# Patient Record
Sex: Male | Born: 1955 | Race: White | Hispanic: No | Marital: Single | Smoking: Never smoker
Health system: Southern US, Community
[De-identification: ages and names within clinical notes are randomized; demographics above are authoritative.]

## PROBLEM LIST (undated history)

## (undated) DIAGNOSIS — Q858 Other phakomatoses, not elsewhere classified: Secondary | ICD-10-CM

## (undated) DIAGNOSIS — G8190 Hemiplegia, unspecified affecting unspecified side: Secondary | ICD-10-CM

## (undated) DIAGNOSIS — J45909 Unspecified asthma, uncomplicated: Secondary | ICD-10-CM

## (undated) DIAGNOSIS — E119 Type 2 diabetes mellitus without complications: Secondary | ICD-10-CM

## (undated) DIAGNOSIS — Q8589 Other phakomatoses, not elsewhere classified: Secondary | ICD-10-CM

## (undated) DIAGNOSIS — R569 Unspecified convulsions: Secondary | ICD-10-CM

## (undated) DIAGNOSIS — N189 Chronic kidney disease, unspecified: Secondary | ICD-10-CM

## (undated) DIAGNOSIS — D649 Anemia, unspecified: Secondary | ICD-10-CM

## (undated) DIAGNOSIS — G4733 Obstructive sleep apnea (adult) (pediatric): Secondary | ICD-10-CM

## (undated) DIAGNOSIS — E7800 Pure hypercholesterolemia, unspecified: Secondary | ICD-10-CM

## (undated) DIAGNOSIS — G40909 Epilepsy, unspecified, not intractable, without status epilepticus: Secondary | ICD-10-CM

## (undated) DIAGNOSIS — H409 Unspecified glaucoma: Secondary | ICD-10-CM

## (undated) DIAGNOSIS — C439 Malignant melanoma of skin, unspecified: Secondary | ICD-10-CM

## (undated) DIAGNOSIS — E785 Hyperlipidemia, unspecified: Secondary | ICD-10-CM

## (undated) DIAGNOSIS — E669 Obesity, unspecified: Secondary | ICD-10-CM

## (undated) HISTORY — DX: Epilepsy, unspecified, not intractable, without status epilepticus: G40.909

## (undated) HISTORY — DX: Other phakomatoses, not elsewhere classified: Q85.89

## (undated) HISTORY — PX: COLONOSCOPY: SHX174

## (undated) HISTORY — DX: Obesity, unspecified: E66.9

## (undated) HISTORY — DX: Malignant melanoma of skin, unspecified: C43.9

## (undated) HISTORY — DX: Unspecified convulsions: R56.9

## (undated) HISTORY — DX: Other phakomatoses, not elsewhere classified: Q85.8

## (undated) HISTORY — DX: Unspecified glaucoma: H40.9

## (undated) HISTORY — DX: Pure hypercholesterolemia, unspecified: E78.00

## (undated) HISTORY — DX: Type 2 diabetes mellitus without complications: E11.9

## (undated) HISTORY — DX: Hemiplegia, unspecified affecting unspecified side: G81.90

## (undated) HISTORY — DX: Obstructive sleep apnea (adult) (pediatric): G47.33

## (undated) HISTORY — DX: Hyperlipidemia, unspecified: E78.5

---

## 1997-09-24 HISTORY — PX: CRANIOTOMY: SHX93

## 1999-08-28 ENCOUNTER — Encounter: Admission: RE | Admit: 1999-08-28 | Discharge: 1999-08-28 | Payer: Self-pay | Admitting: Emergency Medicine

## 1999-08-28 ENCOUNTER — Encounter: Payer: Self-pay | Admitting: Emergency Medicine

## 1999-11-13 ENCOUNTER — Encounter (INDEPENDENT_AMBULATORY_CARE_PROVIDER_SITE_OTHER): Payer: Self-pay | Admitting: *Deleted

## 1999-11-13 ENCOUNTER — Ambulatory Visit (HOSPITAL_BASED_OUTPATIENT_CLINIC_OR_DEPARTMENT_OTHER): Admission: RE | Admit: 1999-11-13 | Discharge: 1999-11-13 | Payer: Self-pay | Admitting: General Surgery

## 2000-05-01 ENCOUNTER — Encounter: Payer: Self-pay | Admitting: Emergency Medicine

## 2000-05-01 ENCOUNTER — Encounter: Admission: RE | Admit: 2000-05-01 | Discharge: 2000-05-01 | Payer: Self-pay | Admitting: Emergency Medicine

## 2001-12-22 ENCOUNTER — Encounter: Payer: Self-pay | Admitting: Pulmonary Disease

## 2001-12-22 ENCOUNTER — Ambulatory Visit (HOSPITAL_BASED_OUTPATIENT_CLINIC_OR_DEPARTMENT_OTHER): Admission: RE | Admit: 2001-12-22 | Discharge: 2001-12-22 | Payer: Self-pay | Admitting: Emergency Medicine

## 2002-01-20 ENCOUNTER — Encounter: Payer: Self-pay | Admitting: Pulmonary Disease

## 2003-05-14 ENCOUNTER — Encounter: Payer: Self-pay | Admitting: Emergency Medicine

## 2003-05-14 ENCOUNTER — Encounter: Admission: RE | Admit: 2003-05-14 | Discharge: 2003-05-14 | Payer: Self-pay | Admitting: Emergency Medicine

## 2003-06-02 ENCOUNTER — Encounter: Admission: RE | Admit: 2003-06-02 | Discharge: 2003-07-26 | Payer: Self-pay | Admitting: Emergency Medicine

## 2003-12-08 ENCOUNTER — Encounter: Admission: RE | Admit: 2003-12-08 | Discharge: 2003-12-08 | Payer: Self-pay | Admitting: Emergency Medicine

## 2003-12-14 ENCOUNTER — Encounter: Admission: RE | Admit: 2003-12-14 | Discharge: 2003-12-14 | Payer: Self-pay | Admitting: Emergency Medicine

## 2003-12-29 ENCOUNTER — Encounter: Admission: RE | Admit: 2003-12-29 | Discharge: 2003-12-29 | Payer: Self-pay | Admitting: Emergency Medicine

## 2004-01-18 ENCOUNTER — Encounter: Admission: RE | Admit: 2004-01-18 | Discharge: 2004-01-18 | Payer: Self-pay | Admitting: Emergency Medicine

## 2004-04-11 ENCOUNTER — Encounter: Admission: RE | Admit: 2004-04-11 | Discharge: 2004-04-11 | Payer: Self-pay | Admitting: Emergency Medicine

## 2004-11-20 ENCOUNTER — Encounter: Admission: RE | Admit: 2004-11-20 | Discharge: 2004-11-20 | Payer: Self-pay | Admitting: Emergency Medicine

## 2005-04-12 ENCOUNTER — Ambulatory Visit: Payer: Self-pay | Admitting: Oncology

## 2005-06-27 ENCOUNTER — Ambulatory Visit: Payer: Self-pay | Admitting: Internal Medicine

## 2005-07-11 ENCOUNTER — Ambulatory Visit: Payer: Self-pay | Admitting: Internal Medicine

## 2005-07-26 ENCOUNTER — Ambulatory Visit: Payer: Self-pay | Admitting: Oncology

## 2005-09-05 IMAGING — US US EXTREM LOW VENOUS BILAT
1 series · 14 of 24 positions shown · non-contrast
Comparison: none

CLINICAL DATA: Severe bilateral leg pain. 
 BILATERAL VENOUS ULTRASOUND
 Comparison ? none.

[Series 1: unknown · 14 of 45 slices shown]
[im 1/45]
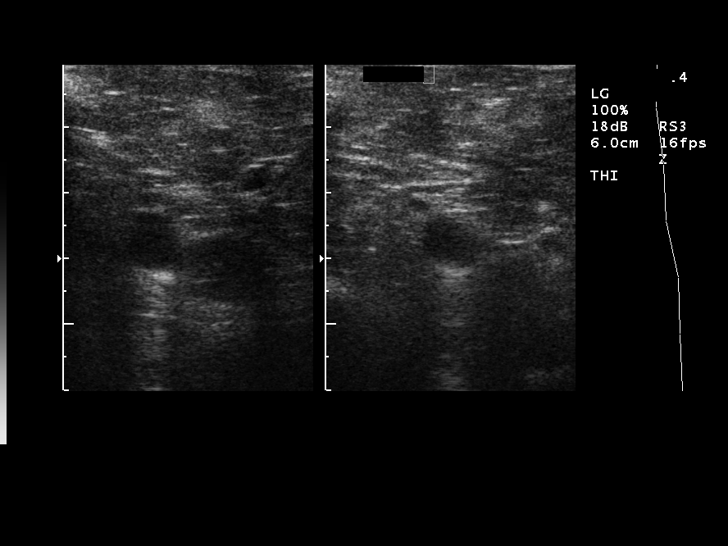
[im 4/45]
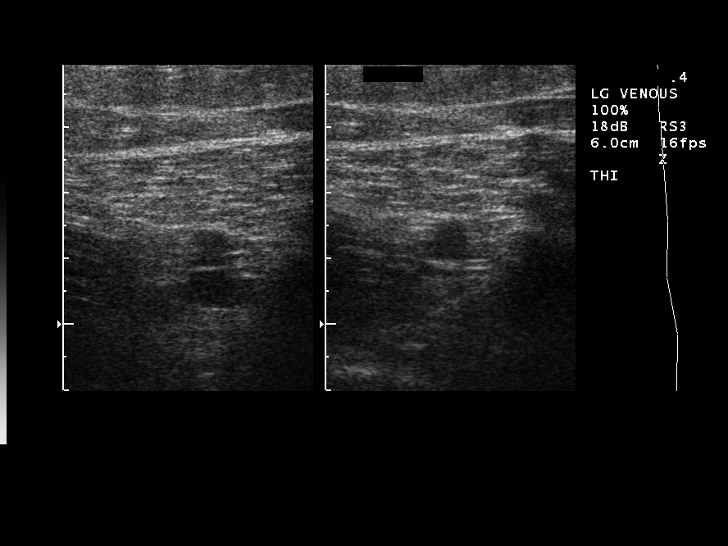
[im 8/45]
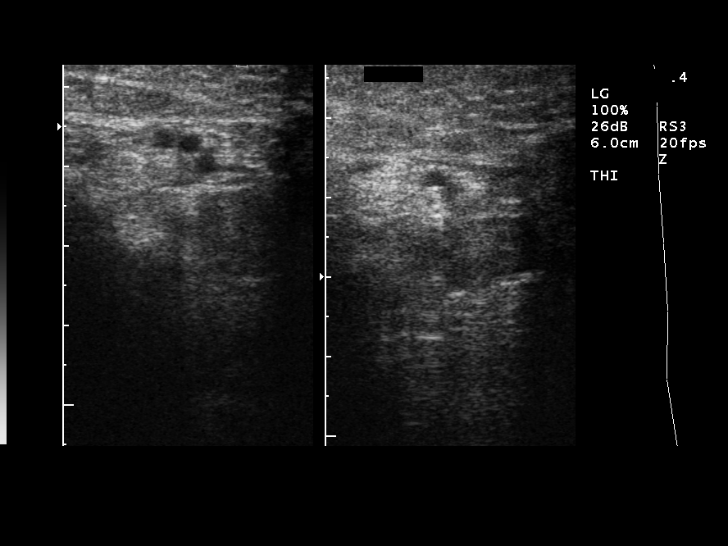
[im 12/45]
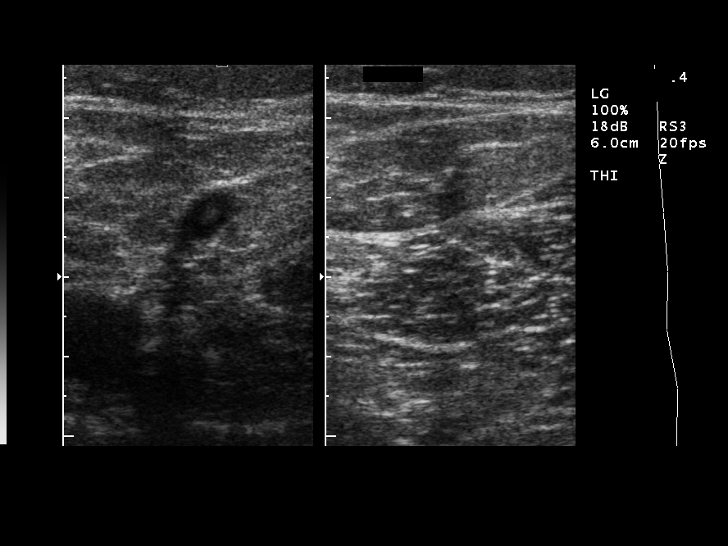
[im 14/45]
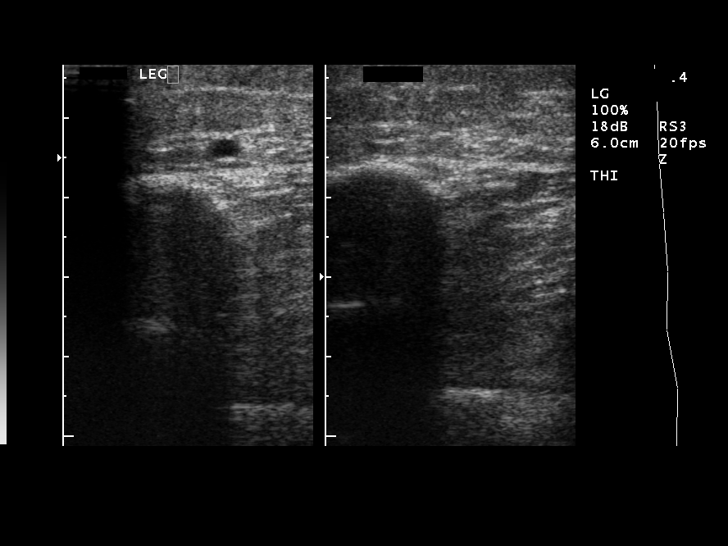
[im 18/45]
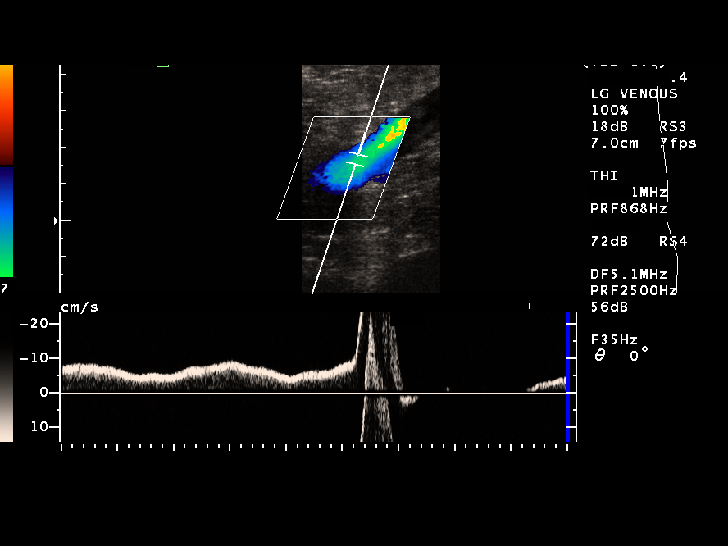
[im 22/45]
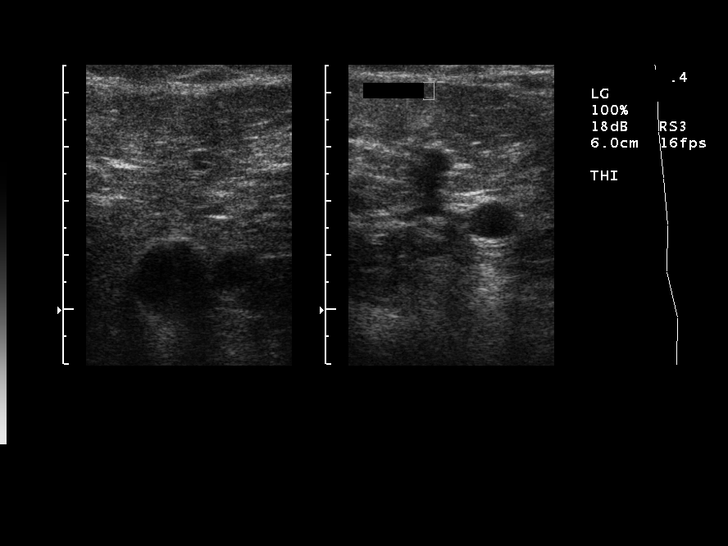
[im 23/45]
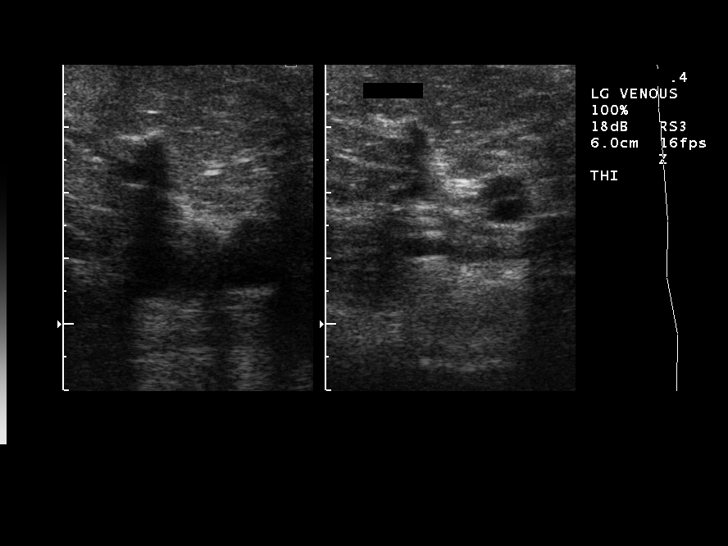
[im 27/45]
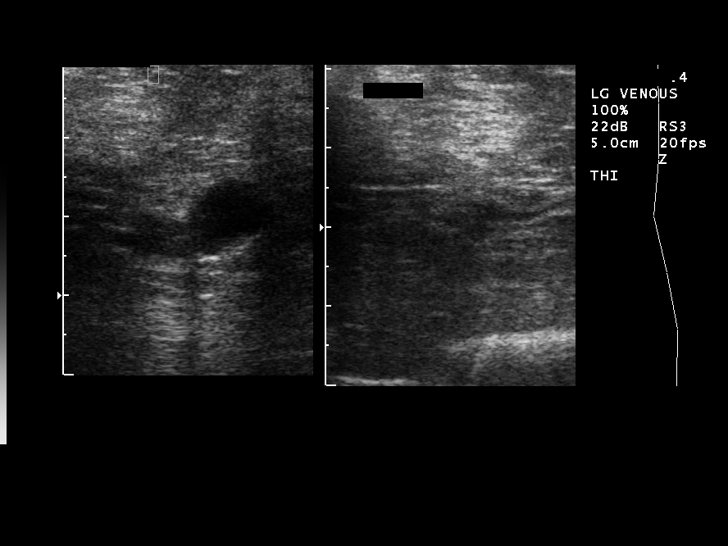
[im 31/45]
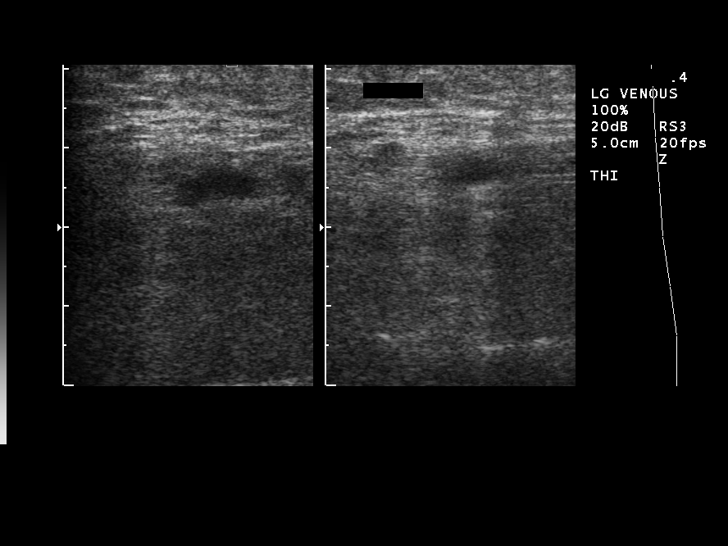
[im 35/45]
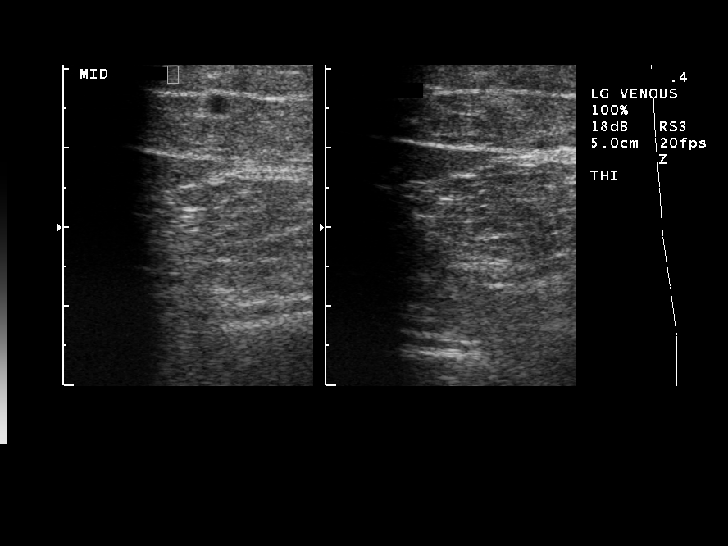
[im 37/45]
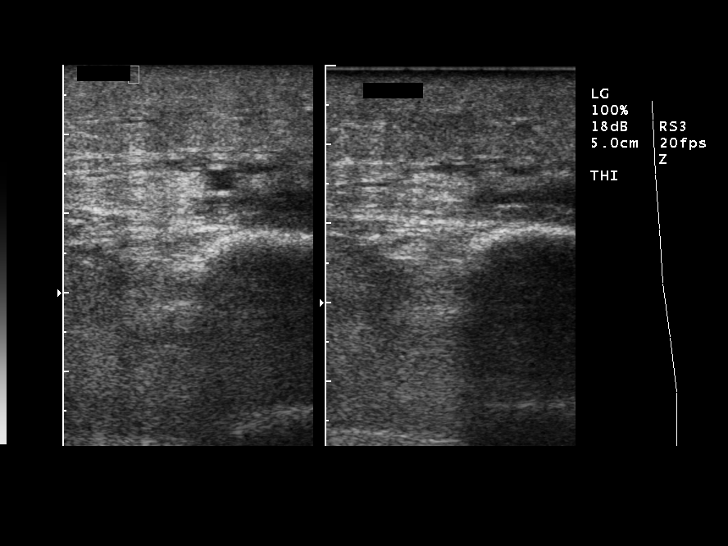
[im 41/45]
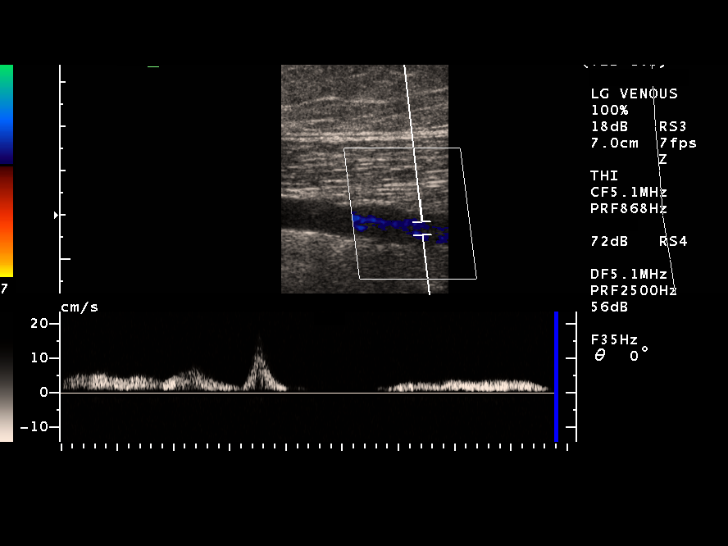
[im 45/45]
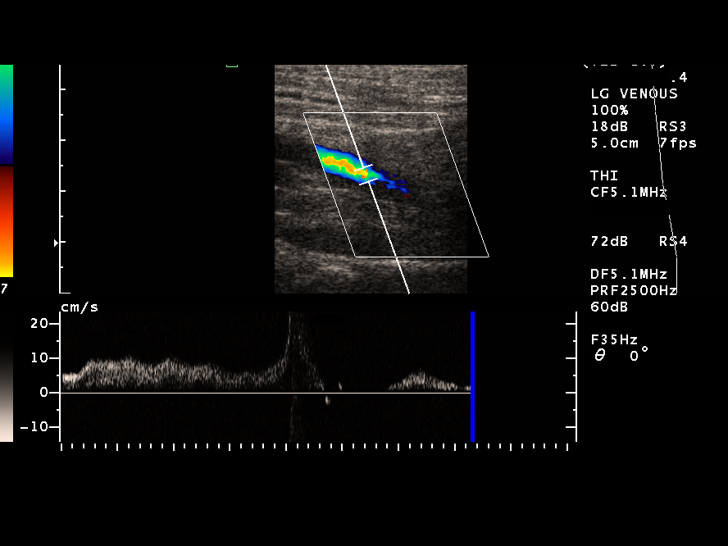

[14 of 24 positions shown; findings below may reference images not displayed]

FINDINGS: Real-time multiplanar gray scale ultrasonography of both lower extremities was performed.  The major deep venous structures show normal patent directional flow, normal compressibility, normal augmentation, and normal phasicity.

 IMPRESSION

 No evidence for deep venous thrombosis in either lower extremity.

## 2006-06-17 ENCOUNTER — Ambulatory Visit: Payer: Self-pay | Admitting: Pulmonary Disease

## 2007-01-03 ENCOUNTER — Encounter: Admission: RE | Admit: 2007-01-03 | Discharge: 2007-01-03 | Payer: Self-pay | Admitting: Emergency Medicine

## 2007-04-14 ENCOUNTER — Encounter: Admission: RE | Admit: 2007-04-14 | Discharge: 2007-04-14 | Payer: Self-pay | Admitting: Emergency Medicine

## 2007-05-07 ENCOUNTER — Encounter: Admission: RE | Admit: 2007-05-07 | Discharge: 2007-05-07 | Payer: Self-pay | Admitting: Emergency Medicine

## 2009-01-10 IMAGING — CR DG CERVICAL SPINE COMPLETE 4+V
7 series · 7 of 7 positions shown · non-contrast
Comparison: none

CLINICAL DATA: Shoulder pain down to arm. 
CERVICAL SPINE - 6 VIEW:

[view not recorded (1 of 7)]
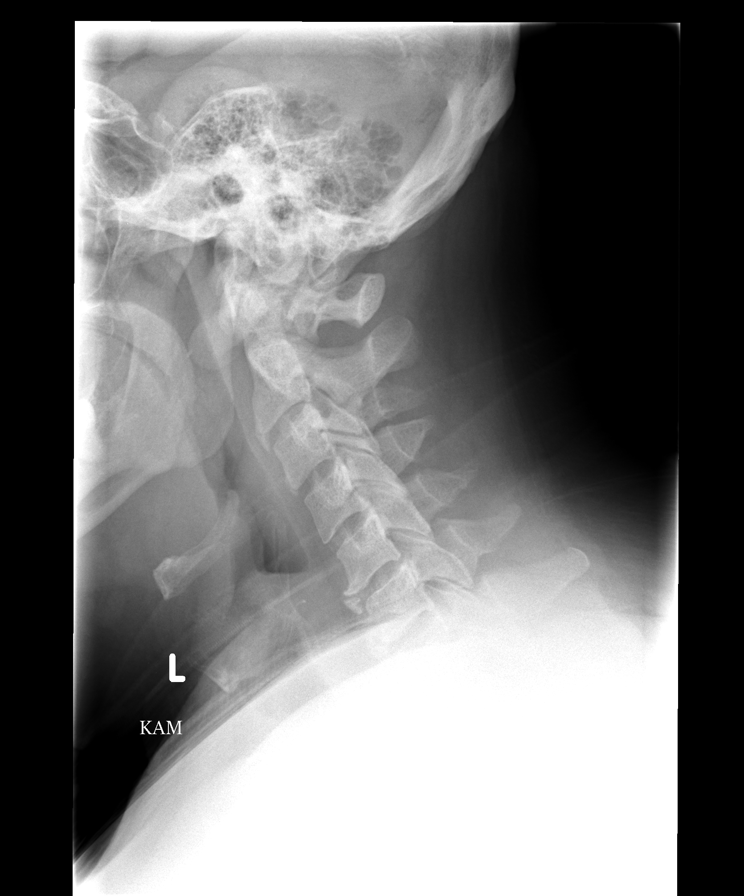

[view not recorded (2 of 7)]
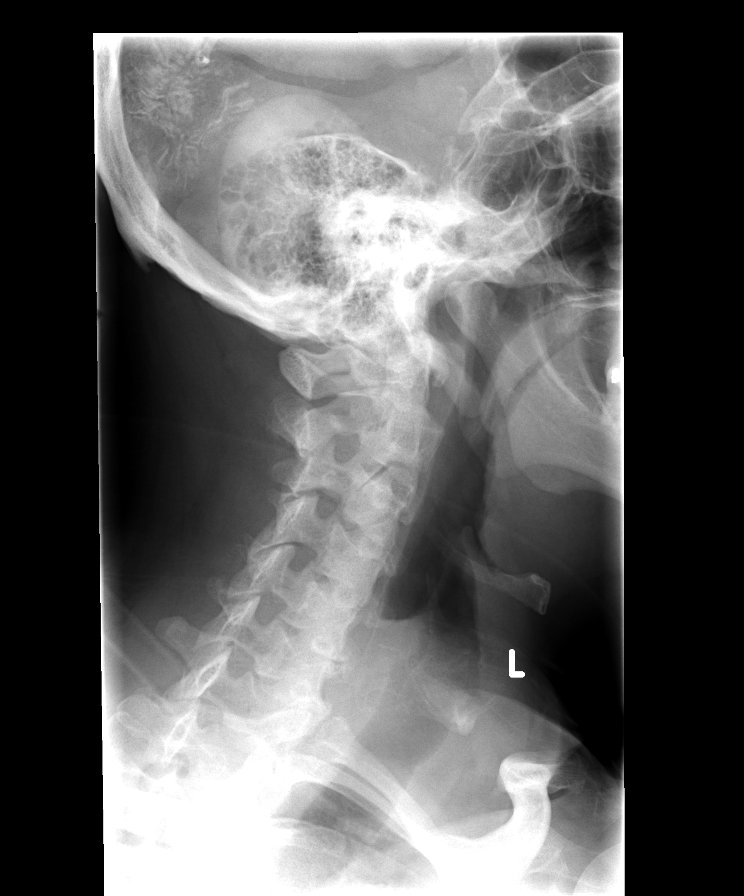

[view not recorded (3 of 7)]
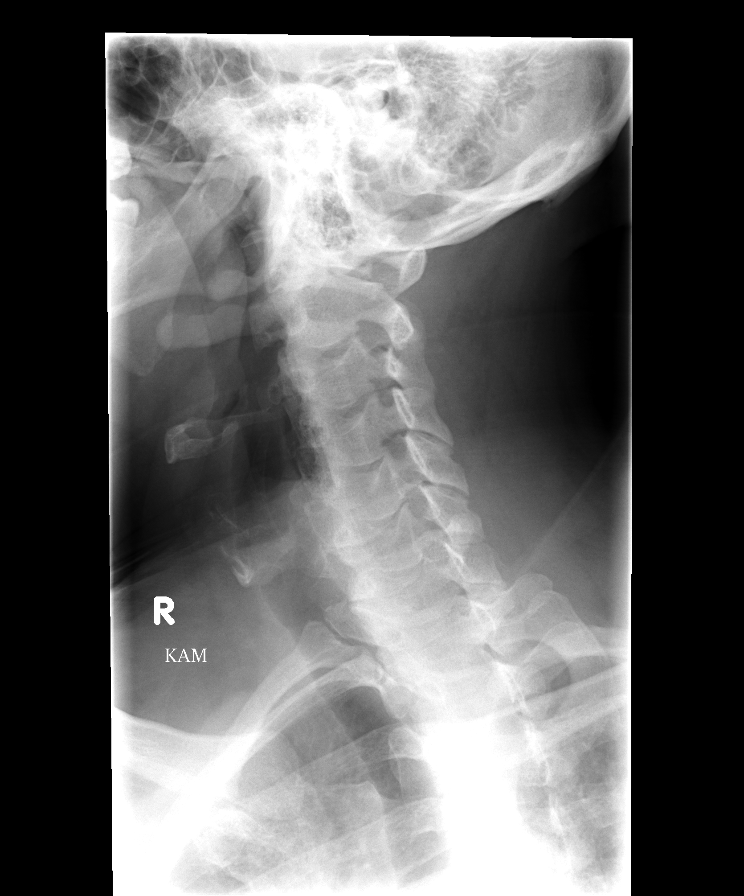

[view not recorded (4 of 7)]
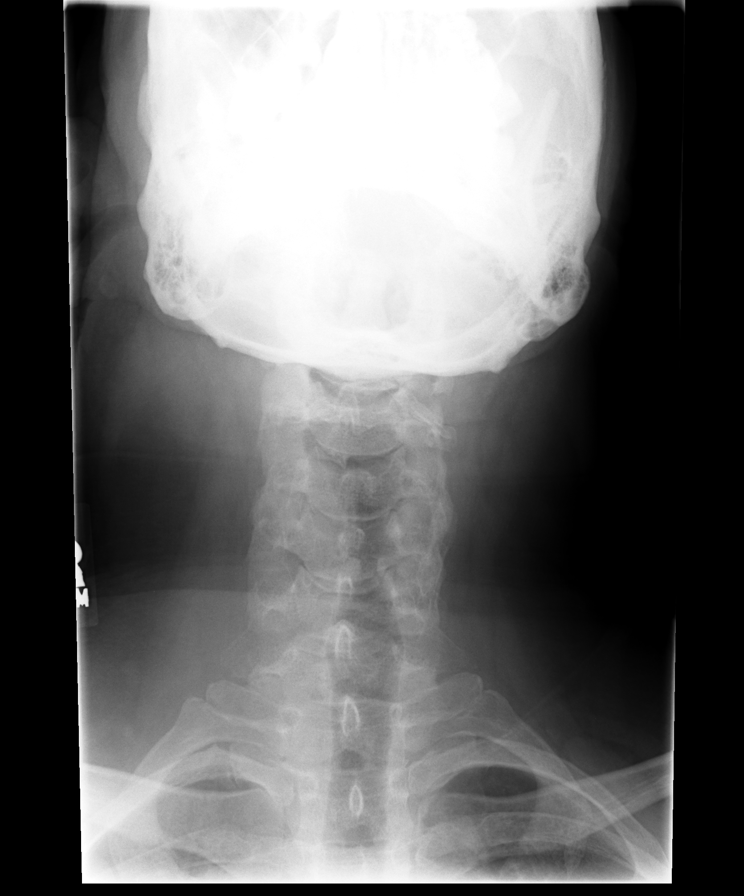

[view not recorded (5 of 7)]
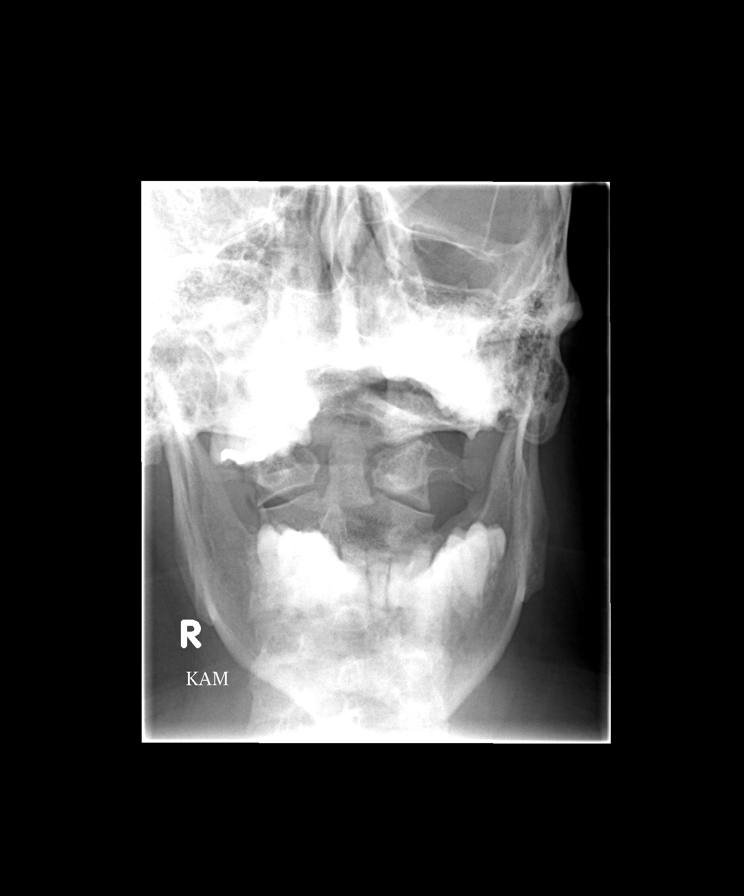

[view not recorded (6 of 7)]
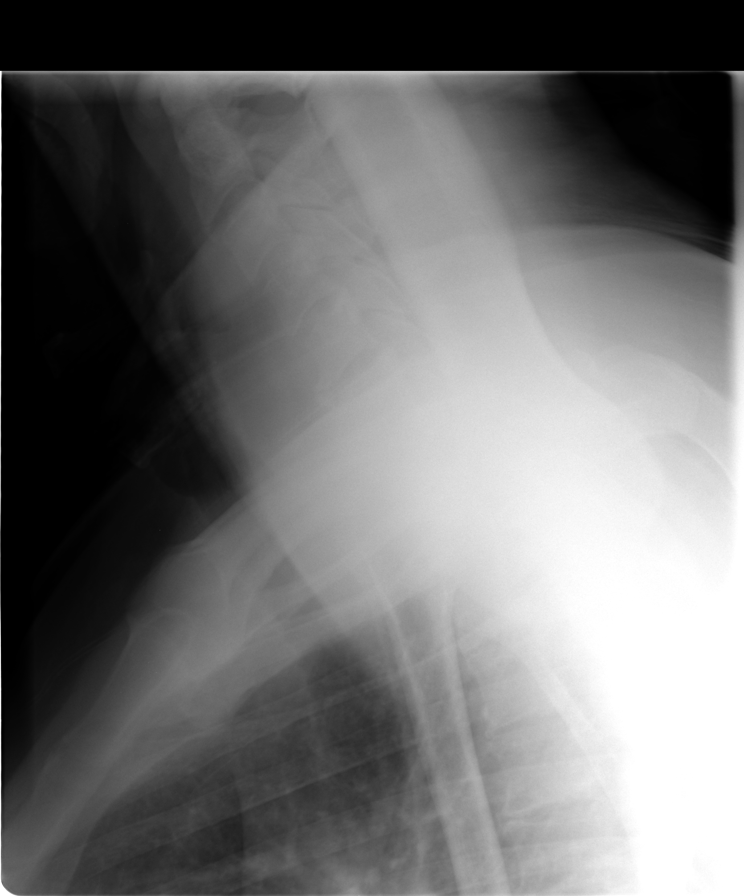

[view not recorded (7 of 7)]
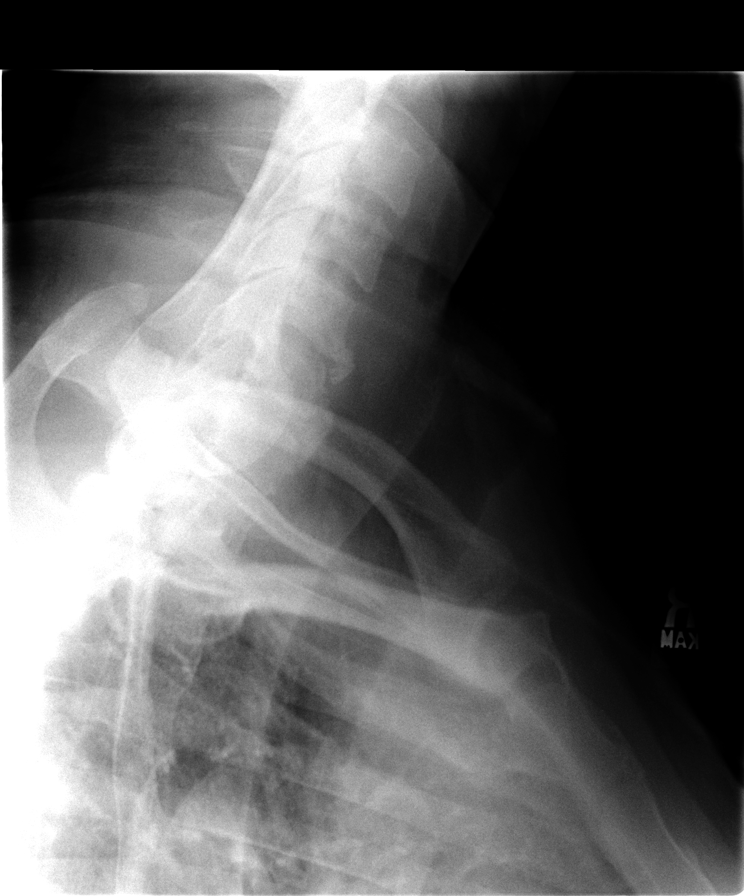

[7 of 7 positions shown; findings below may reference images not displayed]

FINDINGS: The cervical spine is visualized from the occiput to T1.  Prevertebral soft tissues are within normal limits.  There is straightening of the normal cervical lordosis without subluxation or fracture.  Anterior osteophytosis is seen at C2, C3, C5 and C6.  Mild uncovertebral hypertrophy.  Facet sclerosis.   Neural foramina are patent.
IMPRESSION: Spondylosis and straightening without acute finding.

## 2009-09-24 HISTORY — PX: OTHER SURGICAL HISTORY: SHX169

## 2009-10-05 DIAGNOSIS — Z8631 Personal history of diabetic foot ulcer: Secondary | ICD-10-CM | POA: Insufficient documentation

## 2009-10-05 DIAGNOSIS — G4730 Sleep apnea, unspecified: Secondary | ICD-10-CM | POA: Insufficient documentation

## 2009-10-05 DIAGNOSIS — G4733 Obstructive sleep apnea (adult) (pediatric): Secondary | ICD-10-CM | POA: Insufficient documentation

## 2009-10-05 DIAGNOSIS — Z85820 Personal history of malignant melanoma of skin: Secondary | ICD-10-CM | POA: Insufficient documentation

## 2009-10-05 DIAGNOSIS — E7800 Pure hypercholesterolemia, unspecified: Secondary | ICD-10-CM | POA: Insufficient documentation

## 2009-10-05 DIAGNOSIS — Z8669 Personal history of other diseases of the nervous system and sense organs: Secondary | ICD-10-CM

## 2009-10-06 ENCOUNTER — Ambulatory Visit: Payer: Self-pay | Admitting: Pulmonary Disease

## 2009-11-04 ENCOUNTER — Encounter: Payer: Self-pay | Admitting: Pulmonary Disease

## 2010-10-15 ENCOUNTER — Encounter: Payer: Self-pay | Admitting: Emergency Medicine

## 2010-10-24 NOTE — Miscellaneous (Signed)
Summary: ONO on room air  Clinical Lists Changes  Orders: Added new Referral order of DME Referral (DME) - Signed ONO on room air shows low sat 61%, with spent less than 88%.  will start on oxygen at 2 lpm with sleep only.

## 2010-10-24 NOTE — Consult Note (Signed)
Summary: Sleep Med Consult/[facility]  Sleep Med Consult/[facility]   Imported By: Sherian Rein 10/07/2009 10:01:48  _____________________________________________________________________  External Attachment:    Type:   Image     Comment:   External Document

## 2010-10-24 NOTE — Assessment & Plan Note (Signed)
Summary: rov for osa   CC:  Pt is here for a f/u appt.  Pt last saw Dr. Shelle Iron Sept 2007.  Pt states he is currently not wearing cpap machine.  pt states it makes " too much noise" and also difficulty breathing while wearing cpap machine.  Marland Kitchen  History of Present Illness: The pt comes in today for f/u of his known severe osa.  He was started on cpap in the past, and initially did ok, but quickly began to have mask and pressure issues.  Despite trying various things, he was lost to f/u, and has not been seen since 2007.  He has not worn cpap in over a year, and really does not feel this is a viable therapy for him.  He lives in a group home, and his mother feels part of the problem is the upkeep and troubleshooting.  He also complains about the noise from the machine, and that he has difficulty with pressure tolerance (affects his breathing).  Current Medications (verified): 1)  Vitamin D 400 Unit Caps (Cholecalciferol) .... Take 2 Tabs By Mouth Once Daily 2)  Aspirin Low Dose 81 Mg Tabs (Aspirin) .... Take 1 Tablet By Mouth Once A Day 3)  Cosopt 2-0.5 % Soln (Dorzolamide-Timolol) .Marland Kitchen.. 1 Drop in R Eye Two Times A Day 4)  Senna-Plus 8.6-50 Mg Tabs (Sennosides-Docusate Sodium) .... Take 1 Tablet By Mouth Two Times A Day 5)  Fish Oil 1000 Mg Caps (Omega-3 Fatty Acids) .... Take 1 Tablet By Mouth Two Times A Day 6)  Lumigan 0.03 % Soln (Bimatoprost) .Marland Kitchen.. 1 Drop in R Eye At Bedtime 7)  Vytorin 10-20 Mg Tabs (Ezetimibe-Simvastatin) .... Take 1 Tablet By Mouth Once A Day 8)  Artificial Tears  Soln (Artificial Tear Solution) .... Instill in Each Eye Four Times A Day As Needed  Allergies (verified): No Known Drug Allergies  Social History: Patient never smoked.  Pt lives in a group home- [facility] [place].  pt works in the [employer].   Review of Systems      See HPI  Vital Signs:  Patient profile:   55 year old male Weight:      220.13 pounds O2 Sat:      93 % on Room air Temp:      98.1 degrees F oral Pulse rate:   87 / minute BP sitting:   112 / 64  (left arm) Cuff size:   regular  Vitals Entered By: Arman Filter LPN (October 06, 2009 1:31 PM)  O2 Flow:  Room air CC: Pt is here for a f/u appt.  Pt last saw Dr. Shelle Iron Sept 2007.  Pt states he is currently not wearing cpap machine.  pt states it makes " too much noise" and also difficulty breathing while wearing cpap machine.   Comments Unable to verify pt's meds.  pt unsure what meds he is currently taking and did not have a med list with him today.  Aundra Millet Reynolds LPN  October 06, 2009 1:39 PM    Physical Exam  General:  66 male in nad Nose:  no skin breakdown or pressure necrosis from cpap mask   Impression & Recommendations:  Problem # 1:  OBSTRUCTIVE SLEEP APNEA (ICD-327.23) the pt has known severe osa with intolerance of cpap.  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  He really isn't a candidate for  surgery or dental appliance.  He does not feel this is a viable therapy for him longterm, and therefore I would consider nocturnal oxygen as his only treatment.  He understands this is primarily to combat the longterm physiological effects of hypoxemia, and that it will not treat his actual sleep apnea and daytime symptoms.  I have also encouraged him to work on weight loss.  Time spent with pt today was .  Medications Added to Medication List This Visit: 1)  Vitamin D 400 Unit Caps (Cholecalciferol) .... Take 2 tabs by mouth once daily 2)  Aspirin Low Dose 81 Mg Tabs (Aspirin) .... Take 1 tablet by mouth once a day 3)  Cosopt 2-0.5 % Soln (Dorzolamide-timolol) .Marland Kitchen.. 1 drop in r eye two times a day 4)  Senna-plus 8.6-50 Mg Tabs (Sennosides-docusate sodium) .... Take 1 tablet by mouth two times a day 5)  Fish Oil 1000 Mg Caps (Omega-3 fatty acids) .... Take 1 tablet by mouth two times a  day 6)  Lumigan 0.03 % Soln (Bimatoprost) .Marland Kitchen.. 1 drop in r eye at bedtime 7)  Vytorin 10-20 Mg Tabs (Ezetimibe-simvastatin) .... Take 1 tablet by mouth once a day 8)  Artificial Tears Soln (Artificial tear solution) .... Instill in each eye four times a day as needed  Other Orders: Est. Patient Level III (04540) DME Referral (DME)  Patient Instructions: 1)  will try on oxygen at night to help with sleep apnea.  will need to test your oxygen level overnight first, to see what level you need.  Will call you with results 2)  work on weight loss. 3)  followup with me in one year.

## 2011-06-15 ENCOUNTER — Encounter: Payer: Self-pay | Admitting: Pulmonary Disease

## 2011-06-15 ENCOUNTER — Ambulatory Visit: Payer: Self-pay | Admitting: Pulmonary Disease

## 2011-06-18 ENCOUNTER — Ambulatory Visit (INDEPENDENT_AMBULATORY_CARE_PROVIDER_SITE_OTHER): Payer: Medicare Other | Admitting: Pulmonary Disease

## 2011-06-18 ENCOUNTER — Encounter: Payer: Self-pay | Admitting: Pulmonary Disease

## 2011-06-18 VITALS — BP 110/66 | HR 82 | Temp 98.3°F | Ht 66.0 in | Wt 219.0 lb

## 2011-06-18 DIAGNOSIS — G4733 Obstructive sleep apnea (adult) (pediatric): Secondary | ICD-10-CM

## 2011-06-18 NOTE — Progress Notes (Signed)
  Subjective:    Patient ID: Xavier Turner, male    DOB: 10/06/1955, 55 y.o.   MRN: [id number]  HPI Patient comes in today for followup of his very severe obstructive sleep apnea.  He was unable to tolerate CPAP, and therefore was placed on nocturnal oxygen in order to prevent his severe desaturations.  He has been on this compliantly since the last visit and has been doing well.  He continues to have daytime sleepiness, however I have explained to him again the oxygen therapy does not treat his arousals because of apnea.  He has been sent a letter by his DME for recertification of his nocturnal oxygen.   Review of Systems  Constitutional: Negative for fever and unexpected weight change.  HENT: Negative for ear pain, nosebleeds, congestion, sore throat, rhinorrhea, sneezing, trouble swallowing, dental problem, postnasal drip and sinus pressure.   Eyes: Negative for redness and itching.  Respiratory: Negative for cough, chest tightness, shortness of breath and wheezing.   Cardiovascular: Negative for palpitations and leg swelling.  Gastrointestinal: Negative for nausea and vomiting.  Genitourinary: Negative for dysuria.  Musculoskeletal: Negative for joint swelling.  Skin: Negative for rash.  Neurological: Negative for headaches.  Hematological: Does not bruise/bleed easily.  Psychiatric/Behavioral: Negative for dysphoric mood. The patient is not nervous/anxious.        Objective:   Physical Exam Obese male in no acute distress Lower extremities with mild edema, no cyanosis noted Alert, does not appear to be sleepy, moves all 4 extremities.       Assessment & Plan:

## 2011-06-18 NOTE — Assessment & Plan Note (Signed)
The patient has been intolerant of CPAP, and therefore was placed on oxygen in order to treat his severe nocturnal hypoxemia associated with his sleep apnea.  I've asked him to continue on this, and will send a note to his DME for recertification .

## 2011-06-18 NOTE — Patient Instructions (Signed)
Will get your oxygen recertified.  Continue to wear this at night. Work on weight loss

## 2011-06-18 NOTE — Progress Notes (Deleted)
  Subjective:    Patient ID: Xavier Turner, male    DOB: 1955/12/26, 55 y.o.   MRN: [id number]  HPI    Review of Systems  Constitutional: Negative for fever and unexpected weight change.  HENT: Negative for ear pain, nosebleeds, congestion, sore throat, rhinorrhea, sneezing, trouble swallowing, dental problem, postnasal drip and sinus pressure.   Eyes: Negative for redness and itching.  Respiratory: Negative for cough, chest tightness, shortness of breath and wheezing.   Cardiovascular: Negative for palpitations and leg swelling.  Gastrointestinal: Negative for nausea and vomiting.  Genitourinary: Negative for dysuria.  Musculoskeletal: Negative for joint swelling.  Skin: Negative for rash.  Neurological: Negative for headaches.  Hematological: Does not bruise/bleed easily.  Psychiatric/Behavioral: Negative for dysphoric mood. The patient is not nervous/anxious.        Objective:   Physical Exam        Assessment & Plan:

## 2011-08-15 ENCOUNTER — Telehealth: Payer: Self-pay | Admitting: Pulmonary Disease

## 2011-08-15 ENCOUNTER — Other Ambulatory Visit: Payer: Self-pay | Admitting: Pulmonary Disease

## 2011-08-15 DIAGNOSIS — G4733 Obstructive sleep apnea (adult) (pediatric): Secondary | ICD-10-CM

## 2011-08-15 NOTE — Telephone Encounter (Signed)
ONO on room air shows low sat of 84%, but only spent 30 sec the entire night less than 88%.  Does not need nocturnal oxygen.  megan please let pt know he doesn't need oxygen at night, and will send an order to discontinue.

## 2011-08-17 NOTE — Telephone Encounter (Signed)
LMOM for pt TCB 

## 2011-08-22 ENCOUNTER — Telehealth: Payer: Self-pay | Admitting: Pulmonary Disease

## 2011-08-22 NOTE — Telephone Encounter (Signed)
Called and spoke with pts mother about why the oxygen was picked up by [facility].  She is aware that per the sleep study it showed the pt does not need the oxygen at night per [facility].  Mother stated that the pt is just not able to function without the oxygen.  She stated that he is falling asleep during the day and she feels he did better with the oxygen.  appt has been scheduled for Friday for pt  To come in and discuss this with [facility] per mothers request.

## 2011-08-23 NOTE — Telephone Encounter (Signed)
pt's mother already aware of results.  See phone note from 08/22/11

## 2011-08-23 NOTE — Telephone Encounter (Signed)
If neuro eval is unremarkable she stated she will call back to reschedule appt for pt with KC.

## 2011-08-23 NOTE — Telephone Encounter (Signed)
I called and spoke with pt's mother as pt was scheduled to see [facility] on Friday to discuss oxygen status.  However, d/t family emergency, KC's office was cancelled.  I offered to reschedule pt for first week in Dec.  Mother declined stating she going to have pt evaluated by neuro first to make sure what she thinks as pt falling asleep during the day isn't d/t pt having seizures d/t

## 2011-08-24 ENCOUNTER — Ambulatory Visit: Payer: Medicare Other | Admitting: Pulmonary Disease

## 2011-08-31 ENCOUNTER — Encounter: Payer: Self-pay | Admitting: Pulmonary Disease

## 2011-10-18 DIAGNOSIS — Z8669 Personal history of other diseases of the nervous system and sense organs: Secondary | ICD-10-CM | POA: Insufficient documentation

## 2011-10-18 DIAGNOSIS — E669 Obesity, unspecified: Secondary | ICD-10-CM | POA: Insufficient documentation

## 2011-10-18 DIAGNOSIS — F79 Unspecified intellectual disabilities: Secondary | ICD-10-CM | POA: Insufficient documentation

## 2011-10-18 DIAGNOSIS — H409 Unspecified glaucoma: Secondary | ICD-10-CM | POA: Insufficient documentation

## 2011-12-07 DIAGNOSIS — G8194 Hemiplegia, unspecified affecting left nondominant side: Secondary | ICD-10-CM | POA: Insufficient documentation

## 2011-12-07 DIAGNOSIS — R449 Unspecified symptoms and signs involving general sensations and perceptions: Secondary | ICD-10-CM | POA: Insufficient documentation

## 2012-03-18 DIAGNOSIS — E559 Vitamin D deficiency, unspecified: Secondary | ICD-10-CM | POA: Insufficient documentation

## 2012-04-08 DIAGNOSIS — M8580 Other specified disorders of bone density and structure, unspecified site: Secondary | ICD-10-CM | POA: Insufficient documentation

## 2012-04-08 LAB — HM DEXA SCAN

## 2012-04-29 LAB — HM COLONOSCOPY

## 2013-02-05 ENCOUNTER — Other Ambulatory Visit: Payer: Self-pay

## 2013-02-05 MED ORDER — ARMODAFINIL 250 MG PO TABS: 125.0000 mg | ORAL_TABLET | Freq: Two times a day (BID) | ORAL | Status: DC

## 2013-03-13 ENCOUNTER — Institutional Professional Consult (permissible substitution): Payer: Self-pay | Admitting: Neurology

## 2013-03-13 ENCOUNTER — Encounter: Payer: Self-pay | Admitting: Neurology

## 2013-03-13 ENCOUNTER — Ambulatory Visit (INDEPENDENT_AMBULATORY_CARE_PROVIDER_SITE_OTHER): Payer: Medicare Other | Admitting: Neurology

## 2013-03-13 ENCOUNTER — Encounter: Payer: Self-pay | Admitting: *Deleted

## 2013-03-13 VITALS — BP 123/73 | HR 75 | Resp 14 | Ht 64.0 in | Wt 216.0 lb

## 2013-03-13 DIAGNOSIS — G4733 Obstructive sleep apnea (adult) (pediatric): Secondary | ICD-10-CM

## 2013-03-13 DIAGNOSIS — Q858 Other phakomatoses, not elsewhere classified: Secondary | ICD-10-CM | POA: Insufficient documentation

## 2013-03-13 DIAGNOSIS — G4737 Central sleep apnea in conditions classified elsewhere: Secondary | ICD-10-CM

## 2013-03-13 DIAGNOSIS — G4730 Sleep apnea, unspecified: Secondary | ICD-10-CM

## 2013-03-13 DIAGNOSIS — Q8589 Other phakomatoses, not elsewhere classified: Secondary | ICD-10-CM | POA: Insufficient documentation

## 2013-03-13 DIAGNOSIS — G4739 Other sleep apnea: Secondary | ICD-10-CM | POA: Insufficient documentation

## 2013-03-13 NOTE — Assessment & Plan Note (Signed)
Patient on VPAP, with good tolerance at 14 and 8 cm water , since  September 2013 ,  And  Compliance  Downoads. from January- June  2014

## 2013-03-13 NOTE — Patient Instructions (Signed)
Hypersomnia Hypersomnia usually brings recurrent episodes of excessive daytime sleepiness or prolonged nighttime sleep. It is different than feeling tired due to lack of or interrupted sleep at night. People with hypersomnia are compelled to nap repeatedly during the day. This is often at inappropriate times such as:  At work.  During a meal.  In conversation. These daytime naps usually provide no relief. This disorder typically affects adolescents and young adults. CAUSES  This condition may be caused by:  Another sleep disorder (such as narcolepsy or sleep apnea).  Dysfunction of the autonomic nervous system.  Drug or alcohol abuse.  A physical problem, such as:  A tumor.  Head trauma. This is damage caused by an accident.  Injury to the central nervous system.  Certain medications, or medicine withdrawal.  Medical conditions may contribute to the disorder, including:  Multiple sclerosis.  Depression.  Encephalitis.  Epilepsy.  Obesity.  Some people appear to have a genetic predisposition to this disorder. In others, there is no known cause. SYMPTOMS   Patients often have difficulty waking from a long sleep. They may feel dazed or confused.  Other symptoms may include:  Anxiety.  Increased irritation (inflammation).  Decreased energy.  Restlessness.  Slow thinking.  Slow speech.  Loss of appetite.  Hallucinations.  Memory difficulty.  Tremors, Tics.  Some patients lose the ability to function in family, social, occupational, or other settings. TREATMENT  Treatment is symptomatic in nature. Stimulants and other drugs may be used to treat this disorder. Changes in behavior may help. For example, avoid night work and social activities that delay bed time. Changes in diet may offer some relief. Patients should avoid alcohol and caffeine. PROGNOSIS  The likely outcome (prognosis) for persons with hypersomnia depends on the cause of the disorder.  The disorder itself is not life threatening. But it can have serious consequences. For example, automobile accidents can be caused by falling asleep while driving. The attacks usually continue indefinitely. Document Released: 08/31/2002 Document Revised: 12/03/2011 Document Reviewed: 08/04/2008 [facility] Patient Information 2014 [place], [place]. CPAP and BIPAP CPAP and BIPAP are methods of helping you breathe. CPAP stands for "continuous positive airway pressure." BIPAP stands for "bi-level positive airway pressure." Both CPAP and BIPAP are provided by a small machine with a flexible plastic tube that attaches to a plastic mask that goes over your nose or mouth. Air is blown into your air passages through your nose or mouth. This helps to keep your airways open and helps to keep you breathing well. The amount of pressure that is used to blow the air into your air passages can be set on the machine. The pressure setting is based on your needs. With CPAP, the amount of pressure stays the same while you breathe in and out. With BIPAP, the amount of pressure changes when you inhale and exhale. Your caregiver will recommend whether CPAP or BIPAP would be more helpful for you.  CPAP and BIPAP can be helpful for both adults and children with:  Sleep apnea.  Chronic Obstructive Pulmonary Disease (COPD), a condition like emphysema.  Diseases which weaken the muscles of the chest such as muscular dystrophy or neurological diseases.  Other problems that cause breathing to be weak or difficult. USE OF CPAP OR BIPAP The respiratory therapist or technician will help you get used to wearing the mask. Some people feel claustrophobic (a trapped or closed in feeling) at first, because the mask needs to be fairly snug on your face.   It  may help you to get used to the mask gradually, by first holding the mask loosely over your nose or mouth using a low pressure setting on the machine. Gradually the mask can be applied  more snugly with increased pressure. You can also gradually increase the amount of time the mask is used.  People with sleep apnea will use the mask and machine at night when they are sleeping. Others, like those with ALS or other breathing difficulties, may need the CPAP or BIPAP all the time.  If the first mask you try does not fit well, or is uncomfortable, there are other types and sizes that can be tried.  If you tend to breathe through your mouth, a chin strap may be applied to help keep your mouth closed (if you are using a nasal mask).  The CPAP and BIPAP machines have alarms that may sound if the mask comes off or develops a leak.  You should not eat or drink while the CPAP or BIPAP is on. Food or fluids could get pushed into your lungs by the pressure of the CPAP or BIPAP. Sometimes CPAP or BIPAP machines are ordered for home use. If you are going to use the CPAP or BIPAP machine at home, follow these instructions  CPAP or BIPAP machines can be rented or purchased through home health care companies. There are many different brands of machines available. If you rent a machine before purchasing you may find which particular machine works well for you.  Ask questions if there is something you do not understand when picking out your machine.  Place your CPAP or BIPAP machine on a secure table or stand near an electrical outlet.  Know where the On/Off switch is.  Follow your doctor's instructions for how to set the pressure on your machine and when you should use it.  Do not smoke! Tobacco smoke residue can damage the machine. SEEK IMMEDIATE MEDICAL CARE IF:   You have redness or open areas around your nose or mouth.  You have trouble operating the CPAP or BIPAP machine.  You cannot tolerate wearing the CPAP or BIPAP mask.  You have any questions or concerns. Document Released: 06/08/2004 Document Revised: 12/03/2011 Document Reviewed: 09/07/2008 [facility] Patient Information  2014 [place], [place]. Sleep Apnea Sleep apnea is disorder that affects a person's sleep. A person with sleep apnea has abnormal pauses in their breathing when they sleep. It is hard for them to get a good sleep. This makes a person tired during the day. It also can lead to other physical problems. There are three types of sleep apnea. One type is when breathing stops for a short time because your airway is blocked (obstructive sleep apnea). Another type is when the brain sometimes fails to give the normal signal to breathe to the muscles that control your breathing (central sleep apnea). The third type is a combination of the other two types. HOME CARE  Do not sleep on your back. Try to sleep on your side.  Take all medicine as told by your doctor.  Avoid alcohol, calming medicines (sedatives), and depressant drugs.  Try to lose weight if you are overweight. Talk to your doctor about a healthy weight goal. Your doctor may have you use a device that helps to open your airway. It can help you get the air that you need. It is called a positive airway pressure (PAP) device. There are three types of PAP devices:  Continuous positive airway pressure (CPAP) device.  Nasal expiratory positive airway pressure (EPAP) device.  Bilevel positive airway pressure (BPAP) device. MAKE SURE YOU:  Understand these instructions.  Will watch your condition.  Will get help right away if you are not doing well or get worse. Document Released: 06/19/2008 Document Revised: 08/27/2012 Document Reviewed: 01/12/2012 [facility] Patient Information 2014 [place], [place].

## 2013-03-13 NOTE — Progress Notes (Signed)
[facility]  Provider:  Dr Tramayne Sebesta Referring Provider: Reuben Likes, MD Primary Care Physician:  Roque Lias, MD  Chief Complaint  Patient presents with  . Neurologic Problem    Sleep Apnea.Marland Kitchen.RM#10    HPI:  Xavier Turner is a 57 y.o. male here since 2013 as a referral from Dr. Lorenz Coaster for  Apnea follow up and [name] , phakomatosis. This is a revisit.   Xavier Turner  is a mean bile 57 year old Caucasian male with a history of Sturge-Weber syndrome and related epilepsy, MRDD , hemiparesis , and resides in a group home.  The patient underwent brain surgery in 1999 under Dr. Ok [facility] became Seizure-free afterwards. He had been diagnosed with apnea,  I believe originally at [facility] and was prescribed a CPAP but it was removed after the day the download showed noncompliance.  The patient had also seen Dr. Jetty Duhamel at [facility] in 2011 or 2012. Since Dr. Lorenz Coaster is retired , the patient has followed up with his new  primary care provider Dr. Cyndia Bent . I first saw Xavier Turner on 06-02-12 and after a sleep study had been ordered , the  sleep study took place on 04/15/2012 - it became a split night study based on the patient's Epworth sleepiness core of 18 points his Beck Depression Inventory at 13 points, this neck circumference of 20 inches and is BMI of 36.7.  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  The seemed to be effective it should be noted that the patient slept 144 minutes at the setting the AHI was averaging 9 an hour which was the best so far he could do for him. Daily at obtained a download from the CPAP  machine they can compare it to the previous Download from  9th of September 2013- the patient uses a VPAP ultra machine settings are 8 cm expiratory pressure and an inspiratory pressure of 14 cm water,  he has a very low airleak , RR 16 per minute on average respiratory rate, apnea index of 8.3 ,  the median daily usage was 4 hours and 14 minutes,  but there are many days on which the patient was not using his machine.   In the past I have always encountered Xavier Turner mother in our visits today he is here with hs charge. I explained to Xavier Turner and his group home manager at an average of 4 hours nightly is the minimum compliance time that Medicare will accept. It is evident that the previous apnea index at baseline the 12 is now reduced to 8.3 of  which 7.4 are  obstructive apneas  seen per hour and 0.1 central apneas.  I consider this a good success in the treatment. We address  primarily compliance today .   Review of Systems: Out of a complete 14 system review, the patient complains of only the following symptoms, and all other reviewed systems are negative. The patient had no seizures since 1999, he has left-sided weakness and muscle atrophy caused by his sturgy weber syndrome , right facial port wine stain. He does endorse  the Epworth sleepiness score today at 18 points as  he is nodding off in the exam room.  History   Social History  . Marital Status: Single    Spouse Name: N/A    Number of Children: N/A  . Years of Education: N/A   Occupational History  . [employer]     resides in a group home([facility])   Social History Main Topics  . Smoking status: Never Smoker   . Smokeless tobacco: Not on file  . Alcohol Use: Not on file  . Drug Use: Not on file  . Sexually Active: Not on file   Other Topics Concern  . Not on file   Social History Narrative  . No narrative on file    Family History  Problem Relation Age of Onset  . Emphysema Father   . Cancer Father   .  Asthma Mother     Past Medical History  Diagnosis Date  . Seizure disorder   . Melanoma   . Hypercholesterolemia   . OSA (obstructive sleep apnea)   . Glaucoma   . Hemiparesis   . Hyperlipemia   . Obesity   . Seizure   . Sturge syndrome   . Diabetes   . Melanoma   . Sturge-Weber syndrome     Past Surgical History  Procedure Laterality Date  . Craniotomy  1999  . Melanoma removal  2011  . Colonoscopy      Current Outpatient Prescriptions  Medication Sig Dispense Refill  . Armodafinil 250 MG tablet Take 0.5 tablets (125 mg total) by mouth 2 (two) times daily.  30 tablet  5  . ARTIFICIAL TEAR OP Instill in each 4 times a day as needed       . aspirin 81 MG tablet Take 81 mg by mouth daily.        . bimatoprost (LUMIGAN) 0.03 % ophthalmic solution Place 1 drop into the right eye at bedtime.        . dorzolamide-timolol (COSOPT) 22.3-6.8 MG/ML ophthalmic solution Place 1 drop into the right eye 2 (two) times daily.        . Ergocalciferol (VITAMIN D2) 400 UNITS TABS 2 tablets a day       . ezetimibe-simvastatin (VYTORIN) 10-20 MG per tablet Take 1 tablet by mouth daily.        . fish oil-omega-3 fatty acids 1000 MG capsule Take 2 g by mouth 2 (two) times daily.        Marland Kitchen senna-docusate (SENOKOT-S) 8.6-50 MG per tablet Take 1 tablet by mouth 2 (two) times daily.         No current facility-administered medications for this visit.    Allergies as of 03/13/2013 - Review Complete 03/13/2013  Allergen Reaction Noted  . Metformin and related  03/13/2013    Vitals: BP 123/73  Pulse 75  Resp 14  Ht 5\' 4"  (1.626 m)  Wt 216 lb (97.977 kg)  BMI 37.06 kg/m2 Last Weight:  Wt Readings from Last 1 Encounters:  03/13/13 216 lb (97.977 kg)   Last Height:   Ht Readings from Last 1 Encounters:  03/13/13 5\' 4"  (1.626 m)     Physical exam:  General: The patient is awake, alert and appears not in acute distress. The patient is well groomed. Head: Normocephalic, atraumatic. Neck  is supple, he has  A high circumference  20 inches  Mallampati 4 plus- with a semi paralyzed uvula. , neck circumference:20. Poor dentition. Cardiovascular:  Regular rate  and rhythm without  murmurs or carotid bruit, and without distended neck veins. Respiratory: Lungs are clear to auscultation. Skin:  Port wine stain , covers the full left face and neck.  Trunk: BMI is elevated and patient  has left hemiparesis.  Neurologic exam : The patient is drowsy  oriented to place and time.  Memory ,attention span & concentration ability all limited  Speech is fluent without  aphasia. Mood and affect are appropriate.  Cranial nerves: Pupils are equal and briskly reactive to light. Funduscopic exam  deferred.  Extraocular movements : only the left eye  in vertical and horizontal planes intact and without nystagmus.  right eye ptosis and decreased vision form phakomatosis. Visual fields by finger perimetry are intact. Hearing to finger rub intact.  Facial sensation is hypersensitive on the left to fine touch. Facial motor strength: left droop.  Motor exam: hemiparesis.   I have discussed with Xavier Turner and his group home manager that he needs to improve his daily use of the CPAP machine. Compliance is considered 4 hours or more of nightly use.  The patient is very drowsy today and endorse the Epworth score at 18 points he reports that he feels a difference when using the machine delivered the beneficial for him to at least initiate each evening the primary use. The patient is hemiparetic he has trouble to readjust and replace the mask after a bathroom break. His regular bedtime as to intended 11 PM he rises in the morning between 5 to 6 AM. He goes to the bathroom 4 times at night or more. Educational material about sleep at apnea ,the risk factors of  SA and sleep apnea as a risk factor were provided again, this time not to Mr Turner;s mother but his group Land .   I hope this 35 minute session will  help with compliance and assistance for the [facility].

## 2013-03-24 ENCOUNTER — Encounter: Payer: Self-pay | Admitting: Neurology

## 2013-08-19 ENCOUNTER — Other Ambulatory Visit: Payer: Self-pay | Admitting: Neurology

## 2013-08-19 MED ORDER — ARMODAFINIL 250 MG PO TABS: 125.0000 mg | ORAL_TABLET | Freq: Two times a day (BID) | ORAL | Status: DC

## 2013-08-25 ENCOUNTER — Ambulatory Visit: Payer: Medicare Other | Admitting: Neurology

## 2014-01-11 ENCOUNTER — Telehealth: Payer: Self-pay | Admitting: *Deleted

## 2014-01-11 NOTE — Telephone Encounter (Signed)
Caregiver called and stated patient needing refill for Nuvigil 250 mg.  Was instructed by pharmacy that patient needed to see Dr. Brett Fairy prior to have med refilled.  Caregiver wasn't aware of appointment in Dec.  Please advise

## 2014-01-14 ENCOUNTER — Other Ambulatory Visit: Payer: Self-pay

## 2014-01-14 MED ORDER — ARMODAFINIL 250 MG PO TABS: 125.0000 mg | ORAL_TABLET | Freq: Two times a day (BID) | ORAL | Status: DC

## 2014-01-14 NOTE — Telephone Encounter (Signed)
Rx signed and faxed.

## 2014-01-14 NOTE — Telephone Encounter (Signed)
Caregiver Aaron Edelman called back concerning pt's medication Nuvigil 250 mg, please see previous call on 01/11/14, please call Aaron Edelman back concerning this matter, pt has enough till the end of this month only. Thanks

## 2014-01-14 NOTE — Telephone Encounter (Signed)
It does not appear the original message was ever forwarded to anyone.  Refill request has been sent to provider for approval.

## 2015-07-05 DIAGNOSIS — E1140 Type 2 diabetes mellitus with diabetic neuropathy, unspecified: Secondary | ICD-10-CM | POA: Insufficient documentation

## 2015-08-23 ENCOUNTER — Encounter: Payer: Self-pay | Admitting: Internal Medicine

## 2015-08-24 ENCOUNTER — Encounter: Payer: Self-pay | Admitting: Internal Medicine

## 2015-09-14 DIAGNOSIS — Z79899 Other long term (current) drug therapy: Secondary | ICD-10-CM | POA: Insufficient documentation

## 2015-09-14 DIAGNOSIS — R600 Localized edema: Secondary | ICD-10-CM | POA: Insufficient documentation

## 2016-02-29 DIAGNOSIS — F4321 Adjustment disorder with depressed mood: Secondary | ICD-10-CM | POA: Insufficient documentation

## 2017-09-27 DIAGNOSIS — J040 Acute laryngitis: Secondary | ICD-10-CM | POA: Insufficient documentation

## 2017-11-26 DIAGNOSIS — H02401 Unspecified ptosis of right eyelid: Secondary | ICD-10-CM | POA: Insufficient documentation

## 2019-06-29 ENCOUNTER — Encounter (HOSPITAL_COMMUNITY): Payer: Self-pay

## 2019-06-29 ENCOUNTER — Emergency Department (HOSPITAL_COMMUNITY)
Admission: EM | Admit: 2019-06-29 | Discharge: 2019-06-29 | Disposition: A | Discharge status: Home / Self Care | Payer: Medicare Other | Attending: Emergency Medicine | Admitting: Emergency Medicine

## 2019-06-29 ENCOUNTER — Other Ambulatory Visit: Payer: Self-pay

## 2019-06-29 DIAGNOSIS — E119 Type 2 diabetes mellitus without complications: Secondary | ICD-10-CM | POA: Diagnosis not present

## 2019-06-29 DIAGNOSIS — L03116 Cellulitis of left lower limb: Secondary | ICD-10-CM | POA: Diagnosis not present

## 2019-06-29 DIAGNOSIS — Z7984 Long term (current) use of oral hypoglycemic drugs: Secondary | ICD-10-CM | POA: Insufficient documentation

## 2019-06-29 DIAGNOSIS — Z7982 Long term (current) use of aspirin: Secondary | ICD-10-CM | POA: Diagnosis not present

## 2019-06-29 DIAGNOSIS — Z85820 Personal history of malignant melanoma of skin: Secondary | ICD-10-CM | POA: Diagnosis not present

## 2019-06-29 DIAGNOSIS — M25572 Pain in left ankle and joints of left foot: Secondary | ICD-10-CM | POA: Diagnosis present

## 2019-06-29 LAB — CBC
HCT: 39.2 % (ref 39.0–52.0)
Hemoglobin: 12.7 g/dL — ABNORMAL LOW (ref 13.0–17.0)
MCH: 28.3 pg (ref 26.0–34.0)
MCHC: 32.4 g/dL (ref 30.0–36.0)
MCV: 87.5 fL (ref 80.0–100.0)
Platelets: 167 10*3/uL (ref 150–400)
RBC: 4.48 MIL/uL (ref 4.22–5.81)
RDW: 14.5 % (ref 11.5–15.5)
WBC: 14 10*3/uL — ABNORMAL HIGH (ref 4.0–10.5)
nRBC: 0 % (ref 0.0–0.2)

## 2019-06-29 LAB — BASIC METABOLIC PANEL
Anion gap: 8 (ref 5–15)
BUN: 14 mg/dL (ref 8–23)
CO2: 30 mmol/L (ref 22–32)
Calcium: 8.8 mg/dL — ABNORMAL LOW (ref 8.9–10.3)
Chloride: 91 mmol/L — ABNORMAL LOW (ref 98–111)
Creatinine, Ser: 0.63 mg/dL (ref 0.61–1.24)
GFR calc Af Amer: >60 mL/min (ref >60)
GFR calc non Af Amer: >60 mL/min (ref >60)
Glucose, Bld: 166 mg/dL — ABNORMAL HIGH (ref 70–99)
Potassium: 3.8 mmol/L (ref 3.5–5.1)
Sodium: 129 mmol/L — ABNORMAL LOW (ref 135–145)

## 2019-06-29 LAB — D-DIMER, QUANTITATIVE: D-Dimer, Quant: 0.56 ug/mL-FEU — ABNORMAL HIGH (ref 0.00–0.50)

## 2019-06-29 MED ORDER — CEPHALEXIN 500 MG PO CAPS: 500.0000 mg | ORAL_CAPSULE | Freq: Four times a day (QID) | ORAL | 0 refills | Status: DC

## 2019-06-29 MED ORDER — SODIUM CHLORIDE 0.9 % IV SOLN
1.0000 g | Freq: Once | INTRAVENOUS | Status: AC
  Administered 2019-06-29: 1 g via INTRAVENOUS
  Filled 2019-06-29: qty 10

## 2019-06-29 MED ORDER — SODIUM CHLORIDE 0.9 % IV SOLN
INTRAVENOUS | Status: DC
  Administered 2019-06-29: 21:00:00 via INTRAVENOUS

## 2019-06-29 NOTE — ED Provider Notes (Signed)
[facility] DEPT Provider Note   CSN: [id number] Arrival date & time: 06/29/19  2001     History   Chief Complaint Chief Complaint  Patient presents with  . Ankle Pain    HPI Xavier Turner is a 63 y.o. male.     HPI Patient presents to the ED for evaluation of leg swelling.  Pt states his sx started today.  Pt resides in a group home and per the EMS report onset of swelling is unknown.  Pt states his leg is red and swollen.  It is tender to the touch.  No CP or shortness of breath.  No fevers or chills.  Past Medical History:  Diagnosis Date  . Diabetes ([place])   . Glaucoma   . Hemiparesis ([place])   . Hypercholesterolemia   . Hyperlipemia   . Melanoma ([place])   . Melanoma ([place])   . Obesity   . OSA (obstructive sleep apnea)   . Seizure ([place])   . Seizure disorder ([place])   . Sturge syndrome ([place])   . Sturge-Weber syndrome [facility])     Patient Active Problem List   Diagnosis Date Noted  . 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[place])   . HYPERCHOLESTEROLEMIA 10/05/2009  . OBSTRUCTIVE SLEEP APNEA 10/05/2009  . MELANOMA, HX OF 10/05/2009  . SEIZURE DISORDER, HX OF 10/05/2009    Past Surgical History:  Procedure Laterality Date  . COLONOSCOPY    . CRANIOTOMY  1999  . melanoma removal  2011        Home Medications    Prior to Admission medications   Medication Sig Start Date End Date Taking? Authorizing Provider  ARTIFICIAL TEAR OP Place 1 drop into both eyes 4 (four) times daily as needed (dry eyes).    Yes [provider]  aspirin 81 MG tablet Take 81 mg by mouth daily.     Yes [provider]  dorzolamide-timolol (COSOPT) 22.3-6.8 MG/ML ophthalmic solution Place 1 drop into the right eye 2 (two) times daily.     Yes [provider]  Ergocalciferol (VITAMIN D2) 400 UNITS TABS 2 tablets a day    Yes [provider]  escitalopram (LEXAPRO) 10 MG tablet Take 10 mg  by mouth daily.   Yes [provider]  gabapentin (NEURONTIN) 100 MG capsule Take 100 mg by mouth 2 (two) times daily.   Yes [provider]  hydrochlorothiazide (HYDRODIURIL) 25 MG tablet Take 25 mg by mouth daily.   Yes [provider]  meloxicam (MOBIC) 7.5 MG tablet Take 7.5 mg by mouth daily.   Yes [provider]  metFORMIN (GLUCOPHAGE) 1000 MG tablet Take 1,000 mg by mouth 2 (two) times daily with a meal.   Yes [provider]  omeprazole (PRILOSEC) 20 MG capsule Take 40 mg by mouth daily.   Yes [provider]  rosuvastatin (CRESTOR) 20 MG tablet Take 20 mg by mouth daily.   Yes [provider]  sitaGLIPtin (JANUVIA) 100 MG tablet Take 100 mg by mouth daily.   Yes [provider]  tamsulosin (FLOMAX) 0.4 MG CAPS Take 0.4 mg by mouth daily.   Yes [provider]  Armodafinil 250 MG tablet Take 0.5 tablets (125 mg total) by mouth 2 (two) times daily. Patient not taking: Reported on 06/29/2019 01/14/14   Dohmeier, Asencion Partridge, MD  cephALEXin (KEFLEX) 500 MG capsule Take 1 capsule (500 mg total) by mouth 4 (four) times daily.  06/29/19   Dorie Rank, MD    Family History Family History  Problem Relation Age of Onset  . Emphysema Father   . Cancer Father   . Asthma Mother     Social History Social History   Tobacco Use  . Smoking status: Never Smoker  Substance Use Topics  . Alcohol use: Not on file  . Drug use: Not on file     Allergies   Metformin and related   Review of Systems Review of Systems  All other systems reviewed and are negative.    Physical Exam Updated Vital Signs BP 131/74   Pulse 86   Temp 98.3 F (36.8 C)   Resp 17   SpO2 93%   Physical Exam Vitals signs and nursing note reviewed.  Constitutional:      General: He is not in acute distress.    Appearance: He is well-developed.  HENT:     Head: Normocephalic and atraumatic.     Comments: Port wine stain right side of  the face    Right Ear: External ear normal.     Left Ear: External ear normal.  Eyes:     General: No scleral icterus.       Right eye: No discharge.        Left eye: No discharge.     Conjunctiva/sclera: Conjunctivae normal.  Neck:     Musculoskeletal: Neck supple.     Trachea: No tracheal deviation.  Cardiovascular:     Rate and Rhythm: Normal rate and regular rhythm.  Pulmonary:     Effort: Pulmonary effort is normal. No respiratory distress.     Breath sounds: Normal breath sounds. No stridor. No wheezing or rales.  Abdominal:     General: Bowel sounds are normal. There is no distension.     Palpations: Abdomen is soft.     Tenderness: There is no abdominal tenderness. There is no guarding or rebound.  Musculoskeletal:        General: Swelling and tenderness present.     Left lower leg: Edema present.     Comments: Erythema of the left lower extremity from the foot up to the knee, edema and tenderness from the foot to the knee  Skin:    General: Skin is warm and dry.     Findings: No rash.  Neurological:     Mental Status: He is alert.     Cranial Nerves: No cranial nerve deficit (no facial droop, extraocular movements intact, no slurred speech).     Sensory: No sensory deficit.     Motor: No abnormal muscle tone or seizure activity.     Coordination: Coordination normal.      ED Treatments / Results  Labs (all labs ordered are listed, but only abnormal results are displayed) Labs Reviewed  CBC - Abnormal; Notable for the following components:      Result Value   WBC 14.0 (*)    Hemoglobin 12.7 (*)    All other components within normal limits  BASIC METABOLIC PANEL - Abnormal; Notable for the following components:   Sodium 129 (*)    Chloride 91 (*)    Glucose, Bld 166 (*)    Calcium 8.8 (*)    All other components within normal limits  D-DIMER, QUANTITATIVE (NOT AT [facility]) - Abnormal; Notable for the following components:   D-Dimer, Quant 0.56 (*)    All other  components within normal limits     Procedures Procedures (including critical care  time)  Medications Ordered in ED Medications  0.9 %  sodium chloride infusion ( Intravenous New Bag/Given 06/29/19 2111)  cefTRIAXone (ROCEPHIN) 1 g in sodium chloride 0.9 % 100 mL IVPB (0 g Intravenous Stopped 06/29/19 2157)     Initial Impression / Assessment and Plan / ED Course  I have reviewed the triage vital signs and the nursing notes.  Pertinent labs & imaging results that were available during my care of the patient were reviewed by me and considered in my medical decision making (see chart for details).  Clinical Course as of Jun 29 2227  Mon Jun 29, 2019  2203 WBC elevated.   Mild hyponatremia.    D dimer slightly elevated but age adjusted normal.   [JK]    Clinical Course User Index [JK] Dorie Rank, MD     Patient presented to ED for evaluation of leg swelling.  Exam is suggestive of cellulitis.  He is afebrile and otherwise appears well.  I will start him on antibiotics.  D-dimer is only slightly elevated, I doubt DVT, however I will ask him to return tomorrow for an outpatient DVT study as that is not available this evening.  Final Clinical Impressions(s) / ED Diagnoses   Final diagnoses:  Cellulitis of left lower extremity    ED Discharge Orders         Ordered    cephALEXin (KEFLEX) 500 MG capsule  4 times daily     06/29/19 2225           Dorie Rank, MD 06/29/19 2228

## 2019-06-29 NOTE — Discharge Instructions (Addendum)
Take the antibiotics as prescribed.  Monitor for fever or worsening symptoms.  Follow-up with a primary care doctor later this week to have your leg rechecked.  These return tomorrow to have an outpatient ultrasound of your leg to make sure there is not any blood clot.

## 2019-06-29 NOTE — ED Triage Notes (Signed)
Per ems: Pt coming from group home off [address] c/o left ankle pain with unknown start date. Noted to be swollen, red and tender to  touch. Hx of Sturge Weber Syndrome  Sister pam (poa)- [phone]

## 2019-06-29 NOTE — ED Notes (Signed)
Pt and visitor verbalized discharge instructions and follow up care. Alert and assisted into vehicle by staff. No iv.

## 2019-06-30 ENCOUNTER — Ambulatory Visit (HOSPITAL_COMMUNITY)
Admission: RE | Admit: 2019-06-30 | Discharge: 2019-06-30 | Disposition: A | Discharge status: Home / Self Care | Payer: Medicare Other | Source: Ambulatory Visit | Attending: Emergency Medicine | Admitting: Emergency Medicine

## 2019-06-30 DIAGNOSIS — R600 Localized edema: Secondary | ICD-10-CM | POA: Insufficient documentation

## 2019-06-30 DIAGNOSIS — L539 Erythematous condition, unspecified: Secondary | ICD-10-CM | POA: Diagnosis not present

## 2019-06-30 DIAGNOSIS — R609 Edema, unspecified: Secondary | ICD-10-CM

## 2019-06-30 DIAGNOSIS — L538 Other specified erythematous conditions: Secondary | ICD-10-CM | POA: Diagnosis not present

## 2019-06-30 DIAGNOSIS — M7989 Other specified soft tissue disorders: Secondary | ICD-10-CM | POA: Insufficient documentation

## 2019-06-30 NOTE — Progress Notes (Signed)
LLE venous duplex       has been completed. Preliminary results can be found under CV proc through chart review. Atina Feeley, BS, RDMS, RVT    

## 2019-07-06 ENCOUNTER — Encounter (HOSPITAL_COMMUNITY): Payer: Self-pay

## 2019-07-06 ENCOUNTER — Other Ambulatory Visit: Payer: Self-pay

## 2019-07-06 ENCOUNTER — Inpatient Hospital Stay (HOSPITAL_COMMUNITY)
Admission: EM | Admit: 2019-07-06 | Discharge: 2019-07-09 | DRG: 603 | Disposition: A | Discharge status: Home / Self Care | Payer: Medicare Other | Attending: Internal Medicine | Admitting: Internal Medicine

## 2019-07-06 DIAGNOSIS — L03116 Cellulitis of left lower limb: Secondary | ICD-10-CM | POA: Diagnosis not present

## 2019-07-06 DIAGNOSIS — I10 Essential (primary) hypertension: Secondary | ICD-10-CM | POA: Diagnosis present

## 2019-07-06 DIAGNOSIS — Z85820 Personal history of malignant melanoma of skin: Secondary | ICD-10-CM

## 2019-07-06 DIAGNOSIS — K219 Gastro-esophageal reflux disease without esophagitis: Secondary | ICD-10-CM | POA: Diagnosis present

## 2019-07-06 DIAGNOSIS — Z20828 Contact with and (suspected) exposure to other viral communicable diseases: Secondary | ICD-10-CM | POA: Diagnosis present

## 2019-07-06 DIAGNOSIS — Z7982 Long term (current) use of aspirin: Secondary | ICD-10-CM

## 2019-07-06 DIAGNOSIS — Q858 Other phakomatoses, not elsewhere classified: Secondary | ICD-10-CM

## 2019-07-06 DIAGNOSIS — N400 Enlarged prostate without lower urinary tract symptoms: Secondary | ICD-10-CM | POA: Diagnosis present

## 2019-07-06 DIAGNOSIS — E7800 Pure hypercholesterolemia, unspecified: Secondary | ICD-10-CM | POA: Diagnosis present

## 2019-07-06 DIAGNOSIS — G629 Polyneuropathy, unspecified: Secondary | ICD-10-CM | POA: Diagnosis present

## 2019-07-06 DIAGNOSIS — Z825 Family history of asthma and other chronic lower respiratory diseases: Secondary | ICD-10-CM

## 2019-07-06 DIAGNOSIS — G40909 Epilepsy, unspecified, not intractable, without status epilepticus: Secondary | ICD-10-CM | POA: Diagnosis present

## 2019-07-06 DIAGNOSIS — E119 Type 2 diabetes mellitus without complications: Secondary | ICD-10-CM | POA: Diagnosis present

## 2019-07-06 DIAGNOSIS — E1169 Type 2 diabetes mellitus with other specified complication: Secondary | ICD-10-CM | POA: Diagnosis present

## 2019-07-06 DIAGNOSIS — L0390 Cellulitis, unspecified: Secondary | ICD-10-CM | POA: Diagnosis present

## 2019-07-06 DIAGNOSIS — Z7984 Long term (current) use of oral hypoglycemic drugs: Secondary | ICD-10-CM

## 2019-07-06 DIAGNOSIS — G4733 Obstructive sleep apnea (adult) (pediatric): Secondary | ICD-10-CM | POA: Diagnosis present

## 2019-07-06 DIAGNOSIS — E871 Hypo-osmolality and hyponatremia: Secondary | ICD-10-CM | POA: Diagnosis present

## 2019-07-06 DIAGNOSIS — F329 Major depressive disorder, single episode, unspecified: Secondary | ICD-10-CM | POA: Diagnosis present

## 2019-07-06 DIAGNOSIS — R32 Unspecified urinary incontinence: Secondary | ICD-10-CM | POA: Diagnosis present

## 2019-07-06 DIAGNOSIS — Z79899 Other long term (current) drug therapy: Secondary | ICD-10-CM

## 2019-07-06 DIAGNOSIS — E785 Hyperlipidemia, unspecified: Secondary | ICD-10-CM | POA: Diagnosis present

## 2019-07-06 LAB — CBC WITH DIFFERENTIAL/PLATELET
Abs Immature Granulocytes: 0.16 10*3/uL — ABNORMAL HIGH (ref 0.00–0.07)
Basophils Absolute: 0 10*3/uL (ref 0.0–0.1)
Basophils Relative: 0 %
Eosinophils Absolute: 0.2 10*3/uL (ref 0.0–0.5)
Eosinophils Relative: 2 %
HCT: 38.4 % — ABNORMAL LOW (ref 39.0–52.0)
Hemoglobin: 12 g/dL — ABNORMAL LOW (ref 13.0–17.0)
Immature Granulocytes: 2 %
Lymphocytes Relative: 14 %
Lymphs Abs: 1.4 10*3/uL (ref 0.7–4.0)
MCH: 27.5 pg (ref 26.0–34.0)
MCHC: 31.3 g/dL (ref 30.0–36.0)
MCV: 88.1 fL (ref 80.0–100.0)
Monocytes Absolute: 0.7 10*3/uL (ref 0.1–1.0)
Monocytes Relative: 8 %
Neutro Abs: 7.1 10*3/uL (ref 1.7–7.7)
Neutrophils Relative %: 74 %
Platelets: 212 10*3/uL (ref 150–400)
RBC: 4.36 MIL/uL (ref 4.22–5.81)
RDW: 14.3 % (ref 11.5–15.5)
WBC: 9.6 10*3/uL (ref 4.0–10.5)
nRBC: 0 % (ref 0.0–0.2)

## 2019-07-06 LAB — T4, FREE: Free T4: 0.93 ng/dL (ref 0.61–1.12)

## 2019-07-06 LAB — LACTIC ACID, PLASMA
Lactic Acid, Venous: 1.9 mmol/L (ref 0.5–1.9)
Lactic Acid, Venous: 1.1 mmol/L (ref 0.5–1.9)

## 2019-07-06 LAB — BASIC METABOLIC PANEL
Anion gap: 9 (ref 5–15)
Anion gap: 8 (ref 5–15)
BUN: 12 mg/dL (ref 8–23)
BUN: 10 mg/dL (ref 8–23)
CO2: 26 mmol/L (ref 22–32)
CO2: 30 mmol/L (ref 22–32)
Calcium: 8.6 mg/dL — ABNORMAL LOW (ref 8.9–10.3)
Calcium: 8.4 mg/dL — ABNORMAL LOW (ref 8.9–10.3)
Chloride: 93 mmol/L — ABNORMAL LOW (ref 98–111)
Chloride: 88 mmol/L — ABNORMAL LOW (ref 98–111)
Creatinine, Ser: 0.44 mg/dL — ABNORMAL LOW (ref 0.61–1.24)
Creatinine, Ser: 0.41 mg/dL — ABNORMAL LOW (ref 0.61–1.24)
GFR calc Af Amer: >60 mL/min (ref >60)
GFR calc Af Amer: >60 mL/min (ref >60)
GFR calc non Af Amer: >60 mL/min (ref >60)
GFR calc non Af Amer: >60 mL/min (ref >60)
Glucose, Bld: 147 mg/dL — ABNORMAL HIGH (ref 70–99)
Glucose, Bld: 148 mg/dL — ABNORMAL HIGH (ref 70–99)
Potassium: 4 mmol/L (ref 3.5–5.1)
Potassium: 3.4 mmol/L — ABNORMAL LOW (ref 3.5–5.1)
Sodium: 128 mmol/L — ABNORMAL LOW (ref 135–145)
Sodium: 126 mmol/L — ABNORMAL LOW (ref 135–145)

## 2019-07-06 LAB — GLUCOSE, CAPILLARY: Glucose-Capillary: 120 mg/dL — ABNORMAL HIGH (ref 70–99)

## 2019-07-06 LAB — CK: Total CK: 33 U/L — ABNORMAL LOW (ref 49–397)

## 2019-07-06 LAB — HEMOGLOBIN A1C
Hgb A1c MFr Bld: 6.6 % — ABNORMAL HIGH (ref 4.8–5.6)
Mean Plasma Glucose: 142.72 mg/dL

## 2019-07-06 LAB — OSMOLALITY: Osmolality: 276 mOsm/kg (ref 275–295)

## 2019-07-06 LAB — C-REACTIVE PROTEIN: CRP: <0.8 mg/dL (ref <1.0)

## 2019-07-06 LAB — SARS CORONAVIRUS 2 (TAT 6-24 HRS): SARS Coronavirus 2: NEGATIVE

## 2019-07-06 LAB — TSH: TSH: 1.133 u[IU]/mL (ref 0.350–4.500)

## 2019-07-06 LAB — SEDIMENTATION RATE: Sed Rate: 54 mm/hr — ABNORMAL HIGH (ref 0–16)

## 2019-07-06 MED ORDER — DOCUSATE SODIUM 100 MG PO CAPS
100.0000 mg | ORAL_CAPSULE | Freq: Two times a day (BID) | ORAL | Status: DC
  Administered 2019-07-06 – 2019-07-09 (×6): 100 mg via ORAL
  Filled 2019-07-06 (×6): qty 1

## 2019-07-06 MED ORDER — INSULIN ASPART 100 UNIT/ML ~~LOC~~ SOLN
3.0000 [IU] | Freq: Three times a day (TID) | SUBCUTANEOUS | Status: DC
  Administered 2019-07-06 – 2019-07-09 (×9): 3 [IU] via SUBCUTANEOUS
  Filled 2019-07-06: qty 0.03

## 2019-07-06 MED ORDER — ENOXAPARIN SODIUM 40 MG/0.4ML ~~LOC~~ SOLN
40.0000 mg | SUBCUTANEOUS | Status: DC
  Administered 2019-07-06 – 2019-07-08 (×3): 40 mg via SUBCUTANEOUS
  Filled 2019-07-06 (×3): qty 0.4

## 2019-07-06 MED ORDER — ROSUVASTATIN CALCIUM 20 MG PO TABS
20.0000 mg | ORAL_TABLET | Freq: Every day | ORAL | Status: DC
  Administered 2019-07-06 – 2019-07-09 (×4): 20 mg via ORAL
  Filled 2019-07-06 (×4): qty 1

## 2019-07-06 MED ORDER — SODIUM CHLORIDE 0.9 % IV SOLN
1.0000 g | INTRAVENOUS | Status: DC
  Administered 2019-07-07: 1 g via INTRAVENOUS
  Filled 2019-07-06: qty 10
  Filled 2019-07-06: qty 1

## 2019-07-06 MED ORDER — CLINDAMYCIN PHOSPHATE 600 MG/50ML IV SOLN
600.0000 mg | Freq: Once | INTRAVENOUS | Status: AC
  Administered 2019-07-06: 600 mg via INTRAVENOUS
  Filled 2019-07-06: qty 50

## 2019-07-06 MED ORDER — INSULIN ASPART 100 UNIT/ML ~~LOC~~ SOLN
0.0000 [IU] | Freq: Three times a day (TID) | SUBCUTANEOUS | Status: DC
  Administered 2019-07-06: 1 [IU] via SUBCUTANEOUS
  Administered 2019-07-07: 2 [IU] via SUBCUTANEOUS
  Administered 2019-07-07: 1 [IU] via SUBCUTANEOUS
  Administered 2019-07-08: 2 [IU] via SUBCUTANEOUS
  Administered 2019-07-09: 1 [IU] via SUBCUTANEOUS
  Administered 2019-07-09: 3 [IU] via SUBCUTANEOUS
  Filled 2019-07-06: qty 0.09

## 2019-07-06 MED ORDER — ASPIRIN EC 81 MG PO TBEC
81.0000 mg | DELAYED_RELEASE_TABLET | Freq: Every day | ORAL | Status: DC
  Administered 2019-07-07 – 2019-07-09 (×3): 81 mg via ORAL
  Filled 2019-07-06 (×3): qty 1

## 2019-07-06 MED ORDER — SODIUM CHLORIDE 0.9% FLUSH
3.0000 mL | Freq: Two times a day (BID) | INTRAVENOUS | Status: DC
  Administered 2019-07-06 – 2019-07-08 (×4): 3 mL via INTRAVENOUS

## 2019-07-06 MED ORDER — ONDANSETRON HCL 4 MG PO TABS: 4.0000 mg | ORAL_TABLET | Freq: Four times a day (QID) | ORAL | Status: DC | PRN

## 2019-07-06 MED ORDER — DORZOLAMIDE HCL-TIMOLOL MAL 2-0.5 % OP SOLN
1.0000 [drp] | Freq: Two times a day (BID) | OPHTHALMIC | Status: DC
  Administered 2019-07-06 – 2019-07-09 (×6): 1 [drp] via OPHTHALMIC
  Filled 2019-07-06: qty 10

## 2019-07-06 MED ORDER — GABAPENTIN 100 MG PO CAPS
100.0000 mg | ORAL_CAPSULE | Freq: Two times a day (BID) | ORAL | Status: DC
  Administered 2019-07-06 – 2019-07-09 (×6): 100 mg via ORAL
  Filled 2019-07-06 (×5): qty 1

## 2019-07-06 MED ORDER — TAMSULOSIN HCL 0.4 MG PO CAPS
0.4000 mg | ORAL_CAPSULE | Freq: Every day | ORAL | Status: DC
  Administered 2019-07-07 – 2019-07-09 (×3): 0.4 mg via ORAL
  Filled 2019-07-06 (×3): qty 1

## 2019-07-06 MED ORDER — ONDANSETRON HCL 4 MG/2ML IJ SOLN: 4.0000 mg | Freq: Four times a day (QID) | INTRAMUSCULAR | Status: DC | PRN

## 2019-07-06 MED ORDER — POLYETHYLENE GLYCOL 3350 17 G PO PACK: 17.0000 g | PACK | Freq: Every day | ORAL | Status: DC | PRN

## 2019-07-06 MED ORDER — ACETAMINOPHEN 325 MG PO TABS: 650.0000 mg | ORAL_TABLET | Freq: Four times a day (QID) | ORAL | Status: DC | PRN

## 2019-07-06 MED ORDER — ASPIRIN EC 81 MG PO TBEC: 81.0000 mg | DELAYED_RELEASE_TABLET | Freq: Every day | ORAL | Status: DC

## 2019-07-06 MED ORDER — ESCITALOPRAM OXALATE 10 MG PO TABS
10.0000 mg | ORAL_TABLET | Freq: Every day | ORAL | Status: DC
  Administered 2019-07-07 – 2019-07-09 (×3): 10 mg via ORAL
  Filled 2019-07-06 (×3): qty 1

## 2019-07-06 MED ORDER — ACETAMINOPHEN 650 MG RE SUPP: 650.0000 mg | Freq: Four times a day (QID) | RECTAL | Status: DC | PRN

## 2019-07-06 MED ORDER — PANTOPRAZOLE SODIUM 40 MG PO TBEC
40.0000 mg | DELAYED_RELEASE_TABLET | Freq: Every day | ORAL | Status: DC
  Administered 2019-07-07 – 2019-07-09 (×3): 40 mg via ORAL
  Filled 2019-07-06 (×3): qty 1

## 2019-07-06 MED ORDER — INSULIN ASPART 100 UNIT/ML ~~LOC~~ SOLN
0.0000 [IU] | Freq: Every day | SUBCUTANEOUS | Status: DC
  Filled 2019-07-06: qty 0.05

## 2019-07-06 MED ORDER — ARMODAFINIL 250 MG PO TABS: 125.0000 mg | ORAL_TABLET | Freq: Two times a day (BID) | ORAL | Status: DC

## 2019-07-06 MED ORDER — SODIUM CHLORIDE 0.9 % IV SOLN
INTRAVENOUS | Status: DC | PRN
  Administered 2019-07-06: 500 mL via INTRAVENOUS

## 2019-07-06 NOTE — ED Notes (Addendum)
ED TO INPATIENT HANDOFF REPORT  ED Nurse Name and Phone #: [phone] [place] Name/Age/Gender Xavier Turner 63 y.o. male Room/Bed: WA15/WA15  Code Status   Code Status: Full Code  Home/SNF/Other Group Home Patient oriented to: self, place, time and situation Is this baseline? Yes   Triage Complete: Triage complete  Chief Complaint left foot swelling  Triage Note Pt BIBA from [facility]-   Per PTAR- Pt c/o left foot swelling x1 week.  Pt was recently prescribed cephalexin last week, but reports its not working.    Allergies Allergies  Allergen Reactions  . Metformin And Related     Level of Care/Admitting Diagnosis ED Disposition    ED Disposition Condition Comment   Admit  Hospital Area: [facility] [[id number]]  Level of Care: Med-Surg [16]  Covid Evaluation: Asymptomatic Screening Protocol (No Symptoms)  Diagnosis: Cellulitis [id number]  Admitting Physician: Lavina Hamman [id number]  Attending Physician: Lavina Hamman [id number]  PT Class (Do Not Modify): Observation [104]  PT Acc Code (Do Not Modify): Observation [10022]       B Medical/Surgery History Past Medical History:  Diagnosis Date  . Diabetes ([place])   . Glaucoma   . Hemiparesis ([place])   . Hypercholesterolemia   . Hyperlipemia   . Melanoma ([place])   . Melanoma ([place])   . Obesity   . OSA (obstructive sleep apnea)   . Seizure ([place])   . Seizure disorder ([place])   . 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)   . 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 COLONOSCOPY    . CRANIOTOMY  1999  . melanoma removal  2011     A IV Location/Drains/Wounds Patient Lines/Drains/Airways Status   Active Line/Drains/Airways    Name:   Placement date:   Placement time:   Site:   Days:   Peripheral IV 07/06/19 Right;Posterior Forearm   07/06/19    1207    Forearm   less than 1          Intake/Output Last 24 hours  Intake/Output Summary (Last  24 hours) at 07/06/2019 1633 Last data filed at 07/06/2019 1244 Gross per 24 hour  Intake 50 ml  Output -  Net 50 ml    Labs/Imaging Results for orders placed or performed during the hospital encounter of 07/06/19 (from the past 48 hour(s))  Basic metabolic panel     Status: Abnormal   Collection Time: 07/06/19 12:15 PM  Result Value Ref Range   Sodium 128 (L) 135 - 145 mmol/L   Potassium 4.0 3.5 - 5.1 mmol/L   Chloride 93 (L) 98 - 111 mmol/L   CO2 26 22 - 32 mmol/L   Glucose, Bld 147 (H) 70 - 99 mg/dL   BUN 12 8 - 23 mg/dL   Creatinine, Ser 0.44 (L) 0.61 - 1.24 mg/dL   Calcium 8.6 (L) 8.9 - 10.3 mg/dL   GFR calc non Af Amer >60 >60 mL/min   GFR calc Af Amer >60 >60 mL/min   Anion gap 9 5 - 15    Comment: Performed at [facility], [place] [address] [place], [place] [address]  CBC with Differential     Status: Abnormal   Collection Time: 07/06/19 12:15 PM  Result Value Ref Range   WBC 9.6 4.0 - 10.5 K/uL   RBC 4.36 4.22 - 5.81 MIL/uL   Hemoglobin 12.0 (L) 13.0 - 17.0 g/dL   HCT  38.4 (L) 39.0 - 52.0 %   MCV 88.1 80.0 - 100.0 fL   MCH 27.5 26.0 - 34.0 pg   MCHC 31.3 30.0 - 36.0 g/dL   RDW 14.3 11.5 - 15.5 %   Platelets 212 150 - 400 K/uL   nRBC 0.0 0.0 - 0.2 %   Neutrophils Relative % 74 %   Neutro Abs 7.1 1.7 - 7.7 K/uL   Lymphocytes Relative 14 %   Lymphs Abs 1.4 0.7 - 4.0 K/uL   Monocytes Relative 8 %   Monocytes Absolute 0.7 0.1 - 1.0 K/uL   Eosinophils Relative 2 %   Eosinophils Absolute 0.2 0.0 - 0.5 K/uL   Basophils Relative 0 %   Basophils Absolute 0.0 0.0 - 0.1 K/uL   Immature Granulocytes 2 %   Abs Immature Granulocytes 0.16 (H) 0.00 - 0.07 K/uL    Comment: Performed at [facility], [place] [address], [place], [place] [address]  Blood culture (routine x 2)     Status: None (Preliminary result)   Collection Time: 07/06/19 12:15 PM   Specimen: BLOOD  Result Value Ref Range   Specimen Description      BLOOD SITE NOT  SPECIFIED Performed at [facility], [place] [address], [place], [place] [address]    Special Requests      BOTTLES DRAWN AEROBIC AND ANAEROBIC Blood Culture adequate volume Performed at [facility] [address] [place], [place] [address]    Culture PENDING    Report Status PENDING   Lactic acid, plasma     Status: None   Collection Time: 07/06/19 12:15 PM  Result Value Ref Range   Lactic Acid, Venous 1.9 0.5 - 1.9 mmol/L    Comment: Performed at [facility], [place] [address] [place], [place] [address]   No results found.  Pending Labs Unresulted Labs (From admission, onward)    Start     Ordered   07/07/19 0500  Comprehensive metabolic panel  Tomorrow morning,   R     07/06/19 1546   07/07/19 0500  CBC  Tomorrow morning,   R     07/06/19 1546   07/07/19 0500  C-reactive protein  Daily,   R     07/06/19 1546   07/06/19 1547  MRSA PCR Screening  Once,   STAT     07/06/19 1546   07/06/19 1547  Sedimentation rate  Once,   STAT     07/06/19 1546   07/06/19 1547  C-reactive protein  Once,   STAT     07/06/19 1546   07/06/19 1547  CK  Once,   STAT     07/06/19 1546   07/06/19 1543  HIV Antibody (routine testing w rflx)  (HIV Antibody (Routine testing w reflex) panel)  Once,   STAT     07/06/19 1546   07/06/19 1543  HIV4GL Save Tube  (HIV Antibody (Routine testing w reflex) panel)  Once,   STAT     07/06/19 1546   07/06/19 1543  Hemoglobin A1c  Once,   STAT    Comments: To assess prior glycemic control    07/06/19 1546   07/06/19 1300  SARS CORONAVIRUS 2 (TAT 6-24 HRS) Nasopharyngeal Nasopharyngeal Swab  (Asymptomatic/Tier 2 Patients Labs)  Once,   STAT    Question Answer Comment  Is this test for diagnosis or screening Screening   Symptomatic for COVID-19 as defined by CDC No   Hospitalized for  COVID-19 No   Admitted to ICU for COVID-19 No   Previously tested for COVID-19 No   Resident in a congregate (group) care setting Yes    Employed in healthcare setting No      07/06/19 1259   07/06/19 1128  Blood culture (routine x 2)  BLOOD CULTURE X 2,   STAT     07/06/19 1127   07/06/19 1128  Lactic acid, plasma  Now then every 2 hours,   STAT     07/06/19 1127          Vitals/Pain Today's Vitals   07/06/19 1300 07/06/19 1400 07/06/19 1600 07/06/19 1630  BP: 119/71 132/71 133/80 (!) 147/103  Pulse: 72 65 71 69  Resp: 15 16    Temp:      TempSrc:      SpO2: 94% 98% (!) 77% 92%  PainSc:        Isolation Precautions No active isolations  Medications Medications  0.9 %  sodium chloride infusion (500 mLs Intravenous New Bag/Given 07/06/19 1212)  Armodafinil 125 mg (has no administration in time range)  aspirin tablet 81 mg (has no administration in time range)  dorzolamide-timolol (COSOPT) 22.3-6.8 MG/ML ophthalmic solution 1 drop (has no administration in time range)  escitalopram (LEXAPRO) tablet 10 mg (has no administration in time range)  gabapentin (NEURONTIN) capsule 100 mg (has no administration in time range)  pantoprazole (PROTONIX) EC tablet 40 mg (has no administration in time range)  rosuvastatin (CRESTOR) tablet 20 mg (has no administration in time range)  tamsulosin (FLOMAX) capsule 0.4 mg (has no administration in time range)  enoxaparin (LOVENOX) injection 40 mg (has no administration in time range)  sodium chloride flush (NS) 0.9 % injection 3 mL (has no administration in time range)  acetaminophen (TYLENOL) tablet 650 mg (has no administration in time range)    Or  acetaminophen (TYLENOL) suppository 650 mg (has no administration in time range)  docusate sodium (COLACE) capsule 100 mg (has no administration in time range)  polyethylene glycol (MIRALAX / GLYCOLAX) packet 17 g (has no administration in time range)  ondansetron (ZOFRAN) tablet 4 mg (has no administration in time range)    Or  ondansetron (ZOFRAN) injection 4 mg (has no administration in time range)  insulin aspart  (novoLOG) injection 0-9 Units (has no administration in time range)  insulin aspart (novoLOG) injection 0-5 Units (has no administration in time range)  insulin aspart (novoLOG) injection 3 Units (has no administration in time range)  cefTRIAXone (ROCEPHIN) 1 g in sodium chloride 0.9 % 100 mL IVPB (has no administration in time range)  clindamycin (CLEOCIN) IVPB 600 mg (0 mg Intravenous Stopped 07/06/19 1244)    Mobility walks with device High fall risk   Focused Assessments    R Recommendations: See Admitting Provider Note  Report given to: Narveet  Additional Notes:

## 2019-07-06 NOTE — ED Notes (Addendum)
Incorrect SPO2 noted, please omit, SPO2 has been >92% on room air since arrival

## 2019-07-06 NOTE — ED Provider Notes (Signed)
[facility] DEPT Provider Note   CSN: [id number] Arrival date & time: 07/06/19  1022     History   Chief Complaint Chief Complaint  Patient presents with  . Foot Swelling    left    HPI Xavier Turner is a 63 y.o. male.     63yo male with past medical history of diabetes, sturge-weber syndrome, seizures, melanoma, brought in by group home for ongoing left lower leg infection. Patient reports onset of leg pain and redness 2 weeks ago, was seen in this ER on 10/5, diagnosed with cellulitis and started on Keflex. Patient returned on 10/6 for outpatient venous doppler study that was negative for DVT. Patient completed the course of Keflex, has worsening redness and pain of the left lower leg. Also reports leg swelling, not worse than 1 week ago. Has not been monitoring his CBG at home. Denies fevers. No other complaints or concerns.      Past Medical History:  Diagnosis Date  . Diabetes ([place])   . Glaucoma   . Hemiparesis ([place])   . Hypercholesterolemia   . Hyperlipemia   . Melanoma ([place])   . Melanoma ([place])   . Obesity   . OSA (obstructive sleep apnea)   . Seizure ([place])   . Seizure disorder ([place])   . 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 Cellulitis 07/06/2019  . 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 HYPERCHOLESTEROLEMIA 10/05/2009  . OBSTRUCTIVE SLEEP APNEA 10/05/2009  . MELANOMA, HX OF 10/05/2009  . SEIZURE DISORDER, HX OF 10/05/2009    Past Surgical History:  Procedure Laterality Date  . COLONOSCOPY    . CRANIOTOMY  1999  . melanoma removal  2011        Home Medications    Prior to Admission medications   Medication Sig Start Date End Date Taking? Authorizing Provider  Armodafinil 250 MG tablet Take 0.5 tablets (125 mg total) by mouth 2 (two) times daily. Patient not taking: Reported on 06/29/2019  01/14/14   Dohmeier, Asencion Partridge, MD  ARTIFICIAL TEAR OP Place 1 drop into both eyes 4 (four) times daily as needed (dry eyes).     [provider]  aspirin 81 MG tablet Take 81 mg by mouth daily.      [provider]  cephALEXin (KEFLEX) 500 MG capsule Take 1 capsule (500 mg total) by mouth 4 (four) times daily. 06/29/19   Dorie Rank, MD  dorzolamide-timolol (COSOPT) 22.3-6.8 MG/ML ophthalmic solution Place 1 drop into the right eye 2 (two) times daily.      [provider]  Ergocalciferol (VITAMIN D2) 400 UNITS TABS 2 tablets a day     [provider]  escitalopram (LEXAPRO) 10 MG tablet Take 10 mg by mouth daily.    [provider]  gabapentin (NEURONTIN) 100 MG capsule Take 100 mg by mouth 2 (two) times daily.    [provider]  hydrochlorothiazide (HYDRODIURIL) 25 MG tablet Take 25 mg by mouth daily.    [provider]  meloxicam (MOBIC) 7.5 MG tablet Take 7.5 mg by mouth daily.    [provider]  metFORMIN (GLUCOPHAGE) 1000 MG tablet Take 1,000 mg by mouth 2 (two) times daily with a meal.    [provider]  omeprazole (PRILOSEC) 20 MG capsule Take 40 mg by mouth daily.  [provider]  rosuvastatin (CRESTOR) 20 MG tablet Take 20 mg by mouth daily.    [provider]  sitaGLIPtin (JANUVIA) 100 MG tablet Take 100 mg by mouth daily.    [provider]  tamsulosin (FLOMAX) 0.4 MG CAPS Take 0.4 mg by mouth daily.    [provider]    Family History Family History  Problem Relation Age of Onset  . Emphysema Father   . Cancer Father   . Asthma Mother     Social History Social History   Tobacco Use  . Smoking status: Never Smoker  Substance Use Topics  . Alcohol use: Not on file  . Drug use: Not on file     Allergies   Metformin and related   Review of Systems Review of Systems  Constitutional: Negative for fever.  Respiratory: Negative for shortness of breath.    Cardiovascular: Negative for chest pain.  Gastrointestinal: Negative for nausea and vomiting.  Musculoskeletal: Positive for myalgias.  Skin: Positive for color change. Negative for wound.  Allergic/Immunologic: Positive for immunocompromised state.  Neurological: Negative for weakness and numbness.  Hematological: Negative for adenopathy. Does not bruise/bleed easily.  Psychiatric/Behavioral: Negative for confusion.  All other systems reviewed and are negative.    Physical Exam Updated Vital Signs BP 119/71   Pulse 72   Temp 98.9 F (37.2 C) (Oral)   Resp 15   SpO2 94%   Physical Exam Vitals signs and nursing note reviewed.  Constitutional:      General: He is not in acute distress.    Appearance: He is well-developed. He is not diaphoretic.  HENT:     Head: Normocephalic and atraumatic.  Cardiovascular:     Rate and Rhythm: Normal rate and regular rhythm.     Pulses: Normal pulses.  Pulmonary:     Effort: Pulmonary effort is normal.     Breath sounds: Normal breath sounds.  Musculoskeletal:        General: Tenderness present. No swelling.     Right lower leg: No edema.     Left lower leg: He exhibits tenderness. Edema present.       Legs:  Skin:    General: Skin is warm and dry.     Findings: Erythema present.  Neurological:     Mental Status: He is alert and oriented to person, place, and time.  Psychiatric:        Behavior: Behavior normal.      ED Treatments / Results  Labs (all labs ordered are listed, but only abnormal results are displayed) Labs Reviewed  BASIC METABOLIC PANEL - Abnormal; Notable for the following components:      Result Value   Sodium 128 (*)    Chloride 93 (*)    Glucose, Bld 147 (*)    Creatinine, Ser 0.44 (*)    Calcium 8.6 (*)    All other components within normal limits  CBC WITH DIFFERENTIAL/PLATELET - Abnormal; Notable for the following components:   Hemoglobin 12.0 (*)    HCT 38.4 (*)    Abs Immature Granulocytes  0.16 (*)    All other components within normal limits  CULTURE, BLOOD (ROUTINE X 2)  CULTURE, BLOOD (ROUTINE X 2)  SARS CORONAVIRUS 2 (TAT 6-24 HRS)  LACTIC ACID, PLASMA  LACTIC ACID, PLASMA    EKG None  Radiology No results found.  Procedures Procedures (including critical care time)  Medications Ordered in ED Medications  0.9 %  sodium chloride infusion (500  mLs Intravenous New Bag/Given 07/06/19 1212)  clindamycin (CLEOCIN) IVPB 600 mg (600 mg Intravenous New Bag/Given 07/06/19 1214)     Initial Impression / Assessment and Plan / ED Course  I have reviewed the triage vital signs and the nursing notes.  Pertinent labs & imaging results that were available during my care of the patient were reviewed by me and considered in my medical decision making (see chart for details).  Clinical Course as of Jul 05 1350  Mon Jul 06, 2019  1217 63yo male with history of diabetes presents with ongoing/worsening left lower leg pain, swelling, redness. Onset 2 weeks ago, seen in the ER 1 week ago and started on Keflex for cellulitis, had negative venous doppler. Returns with worsening symptoms. On exam, circumferential left lower leg cellulitis with concern for lymphangitis. Question small wound to left lower leg laterally, bedside US of area does not show collection of fluid/abscess.  Labs ordered, patient started on IV Clindamycin, case discussed with Dr. Langston Masker, ER attending, who will see the patient.    [LM]  1300 Labs reviewed without significant changes from previous. Lactic acid 1.9. Redness on LLE outlined for monitoring. Patient was seen by ER attending who agrees with plan to consult for admission.  Case discussed with Dr. Posey Pronto, hospitalist, who will consult for admission.   [LM]    Clinical Course User Index [LM] Tacy Learn, PA-C      Final Clinical Impressions(s) / ED Diagnoses   Final diagnoses:  Cellulitis of left lower extremity    ED Discharge Orders    None        Tacy Learn, PA-C 07/06/19 1351    Wyvonnia Dusky, MD 07/06/19 1942

## 2019-07-06 NOTE — ED Triage Notes (Signed)
Pt BIBA from [place]-   Per PTAR- Pt c/o left foot swelling x1 week.  Pt was recently prescribed cephalexin last week, but reports its not working.

## 2019-07-06 NOTE — H&P (Signed)
[facility] History and Physical   Patient: Xavier Turner [id number]   PCP: Harden Mo, MD DOB: [birth year]   DOA: 07/06/2019   DOS: 07/06/2019   DOS: the patient was seen and examined on 07/06/2019  Patient coming from: The patient is coming from Home  Chief Complaint: Worsening redness of the leg  HPI: Xavier Turner is a 63 y.o. male with Past medical history of type II DM, HLD, HTN, OSA, Sturge-Weber syndrome involving face. Patient presents with complaints of worsening redness of the left leg. He noticed some pain and redness numbness of his left leg a few days ago but mentioned was seen in the ER on 06/30/2019 and underwent ultrasound Doppler which was negative for DVT. Patient was started on oral Keflex and sent home. Patient has been taking his medication religiously and despite that he continues to have severe pain and therefore came to the hospital. No injury reported by the patient.  no trauma. No joint pain. No fever no chills at home. Patient reports that he is compliant with all his medications.  ED Course: Due to worsening redness patient was referred for admission.  At his baseline ambulates with assistance independent for most of his ADL;  manages his medication on his own.  Review of Systems: as mentioned in the history of present illness.  All other systems reviewed and are negative.  Past Medical History:  Diagnosis Date  . Diabetes ([place])   . Glaucoma   . Hemiparesis ([place])   . Hypercholesterolemia   . Hyperlipemia   . Melanoma ([place])   . Melanoma ([place])   . Obesity   . OSA (obstructive sleep apnea)   . Seizure ([place])   . Seizure disorder ([place])   . Sturge syndrome ([place])   . Sturge-Weber syndrome [facility])    Past Surgical History:  Procedure Laterality Date  . COLONOSCOPY    . CRANIOTOMY  1999  . melanoma removal  2011   Social History:  reports that he has never smoked. He has never used smokeless tobacco. No history on file for alcohol  and drug.  Allergies  Allergen Reactions  . Metformin And Related    Family history reviewed and not pertinent Family History  Problem Relation Age of Onset  . Emphysema Father   . Cancer Father   . Asthma Mother      Prior to Admission medications   Medication Sig Start Date End Date Taking? Authorizing Provider  ARTIFICIAL TEAR OP Place 1 drop into both eyes 4 (four) times daily as needed (dry eyes).    Yes [provider]  aspirin 81 MG tablet Take 81 mg by mouth daily.     Yes [provider]  brimonidine (ALPHAGAN) 0.2 % ophthalmic solution Place 1 drop into both eyes 3 (three) times daily. 06/24/19  Yes [provider]  cephALEXin (KEFLEX) 500 MG capsule Take 1 capsule (500 mg total) by mouth 4 (four) times daily. 06/29/19  Yes Dorie Rank, MD  dorzolamide-timolol (COSOPT) 22.3-6.8 MG/ML ophthalmic solution Place 1 drop into the right eye 2 (two) times daily.     Yes [provider]  Ergocalciferol (VITAMIN D2) 400 UNITS TABS 2 tablets a day    Yes [provider]  escitalopram (LEXAPRO) 10 MG tablet Take 10 mg by mouth daily.   Yes [provider]  gabapentin (NEURONTIN) 100 MG capsule Take 100 mg by mouth 2 (two) times daily.   Yes [provider]  hydrochlorothiazide (HYDRODIURIL)  25 MG tablet Take 25 mg by mouth daily.   Yes [provider]  loratadine (CLARITIN) 10 MG tablet Take 10 mg by mouth daily as needed for allergies.   Yes [provider]  meloxicam (MOBIC) 7.5 MG tablet Take 7.5 mg by mouth daily.   Yes [provider]  metFORMIN (GLUCOPHAGE) 1000 MG tablet Take 1,000 mg by mouth 2 (two) times daily with a meal.   Yes [provider]  omeprazole (PRILOSEC) 20 MG capsule Take 40 mg by mouth daily.   Yes [provider]  ROCKLATAN 0.02-0.005 % SOLN Place 1 drop into both eyes at bedtime. 06/12/19  Yes [provider]  rosuvastatin (CRESTOR) 20 MG tablet  Take 20 mg by mouth daily.   Yes [provider]  sitaGLIPtin (JANUVIA) 100 MG tablet Take 100 mg by mouth daily.   Yes [provider]  tamsulosin (FLOMAX) 0.4 MG CAPS Take 0.4 mg by mouth daily.   Yes [provider]  Armodafinil 250 MG tablet Take 0.5 tablets (125 mg total) by mouth 2 (two) times daily. Patient not taking: Reported on 06/29/2019 01/14/14   Dohmeier, Asencion Partridge, MD    Physical Exam: Vitals:   07/06/19 1400 07/06/19 1600 07/06/19 1630 07/06/19 1727  BP: 132/71 133/80 (!) 147/103 (!) 143/82  Pulse: 65 71 69   Resp: 16   20  Temp:    (!) 97.5 F (36.4 C)  TempSrc:    Oral  SpO2: 98% (!) 77% 92% 97%    General: alert and oriented to time, place, and person. Appear in mild distress, affect appropriate Eyes: Left eye conjunctiva normal ENT: Oral Mucosa Clear, moist  Neck: difficult to assess  JVD, no Abnormal Mass Or lumps Cardiovascular: S1 and S2 Present, no Murmur, peripheral pulses symmetrical Respiratory: good respiratory effort, Bilateral Air entry equal and Decreased, no signs of accessory muscle use, Clear to Auscultation, no Crackles, no wheezes Abdomen: Bowel Sound present, Soft and no tenderness, no hernia Skin: Right facial Sturge-Weber syndrome nodules Extremities: bilateral  Pedal edema, no calf tenderness Neurologic: without any new focal findings Gait not checked due to patient safety concerns       Data Reviewed: I have personally reviewed and interpreted labs, imaging as discussed below.  CBC: Recent Labs  Lab 06/29/19 2109 07/06/19 1215  WBC 14.0* 9.6  NEUTROABS  --  7.1  HGB 12.7* 12.0*  HCT 39.2 38.4*  MCV 87.5 88.1  PLT 167 944   Basic Metabolic Panel: Recent Labs  Lab 06/29/19 2109 07/06/19 1215  NA 129* 128*  K 3.8 4.0  CL 91* 93*  CO2 30 26  GLUCOSE 166* 147*  BUN 14 12  CREATININE 0.63 0.44*  CALCIUM 8.8* 8.6*   GFR: CrCl cannot be calculated (Unknown ideal weight.). Liver Function Tests:  No results for input(s): AST, ALT, ALKPHOS, BILITOT, PROT, ALBUMIN in the last 168 hours. No results for input(s): LIPASE, AMYLASE in the last 168 hours. No results for input(s): AMMONIA in the last 168 hours. Coagulation Profile: No results for input(s): INR, PROTIME in the last 168 hours. Cardiac Enzymes: Recent Labs  Lab 07/06/19 1813  CKTOTAL 33*   BNP (last 3 results) No results for input(s): PROBNP in the last 8760 hours. HbA1C: No results for input(s): HGBA1C in the last 72 hours. CBG: No results for input(s): GLUCAP in the last 168 hours. Lipid Profile: No results for input(s): CHOL, HDL, LDLCALC, TRIG, CHOLHDL, LDLDIRECT in the last 72 hours. Thyroid  Function Tests: No results for input(s): TSH, T4TOTAL, FREET4, T3FREE, THYROIDAB in the last 72 hours. Anemia Panel: No results for input(s): VITAMINB12, FOLATE, FERRITIN, TIBC, IRON, RETICCTPCT in the last 72 hours. Urine analysis: No results found for: COLORURINE, APPEARANCEUR, LABSPEC, PHURINE, GLUCOSEU, HGBUR, BILIRUBINUR, KETONESUR, PROTEINUR, UROBILINOGEN, NITRITE, LEUKOCYTESUR  Radiological Exams on Admission: No results found.  I reviewed all nursing notes, pharmacy notes, vitals, pertinent old records.  Assessment/Plan 1.  Left leg cellulitis Failed outpatient treatment Presented with left leg redness recently to the ER. Treated with oral Keflex. Patient remained compliant with his medication and on reevaluation continues to have worsening pain as well as redness and therefore decided to come to the hospital. Currently I will treat the patient with IV ceftriaxone parasite check MRSA PCR Check ESR CRP and CK as well. Patient does have lower extremity swelling as well as pain therefore I will check ABI Doppler on 06/30/2019 - for DVT.  2.  Mood disorder: Continue Lexapro, gabapentin  3.  Type 2 diabetes mellitus  holding Metformin and Januvia. Currently on sliding scale insulin. No support 3 units of  premeal coverage. Check hemoglobin A1c.  4.  Sturge-Weber syndrome. Monitor.  5.  GERD.  Continue PPI.  6.  HLD. Continuing Crestor.  7.  BPH continue Flomax.  8.  Essential HTN. Holding hydrochlorothiazide for now.  In the setting of chronic hyponatremia  9.  Hyponatremia. Etiology not clear correlation patient does not appear to be volume depleted. We will recheck and get further work-up. Continue fluid restricted diet for now  Nutrition: Carb modified diet DVT Prophylaxis: Subcutaneous Lovenox  Advance goals of care discussion: Full code   Consults: none   Family Communication: no family was present at bedside, at the time of interview.  Disposition: Admitted as observation, med-surge unit. Likely to be discharged back to group home, in 2 days.  I have discussed plan of care as described above with RN and patient/family.  Author: Berle Mull, MD [facility] 07/06/2019 8:05 PM   To reach On-call, see care teams to locate the attending and reach out to them via [email or web address]. If 7PM-7AM, please contact night-coverage If you still have difficulty reaching the attending provider, please page the [facility] (Director on Call) for [facility] on amion for assistance.

## 2019-07-07 DIAGNOSIS — Z7982 Long term (current) use of aspirin: Secondary | ICD-10-CM | POA: Diagnosis not present

## 2019-07-07 DIAGNOSIS — Z85820 Personal history of malignant melanoma of skin: Secondary | ICD-10-CM | POA: Diagnosis not present

## 2019-07-07 DIAGNOSIS — Z20828 Contact with and (suspected) exposure to other viral communicable diseases: Secondary | ICD-10-CM | POA: Diagnosis present

## 2019-07-07 DIAGNOSIS — N400 Enlarged prostate without lower urinary tract symptoms: Secondary | ICD-10-CM | POA: Diagnosis present

## 2019-07-07 DIAGNOSIS — G4733 Obstructive sleep apnea (adult) (pediatric): Secondary | ICD-10-CM | POA: Diagnosis present

## 2019-07-07 DIAGNOSIS — Z79899 Other long term (current) drug therapy: Secondary | ICD-10-CM | POA: Diagnosis not present

## 2019-07-07 DIAGNOSIS — L03116 Cellulitis of left lower limb: Secondary | ICD-10-CM | POA: Diagnosis present

## 2019-07-07 DIAGNOSIS — E1169 Type 2 diabetes mellitus with other specified complication: Secondary | ICD-10-CM | POA: Diagnosis present

## 2019-07-07 DIAGNOSIS — R32 Unspecified urinary incontinence: Secondary | ICD-10-CM | POA: Diagnosis present

## 2019-07-07 DIAGNOSIS — I10 Essential (primary) hypertension: Secondary | ICD-10-CM | POA: Diagnosis present

## 2019-07-07 DIAGNOSIS — E871 Hypo-osmolality and hyponatremia: Secondary | ICD-10-CM | POA: Diagnosis present

## 2019-07-07 DIAGNOSIS — Z7984 Long term (current) use of oral hypoglycemic drugs: Secondary | ICD-10-CM | POA: Diagnosis not present

## 2019-07-07 DIAGNOSIS — K219 Gastro-esophageal reflux disease without esophagitis: Secondary | ICD-10-CM | POA: Diagnosis present

## 2019-07-07 DIAGNOSIS — Q858 Other phakomatoses, not elsewhere classified: Secondary | ICD-10-CM | POA: Diagnosis not present

## 2019-07-07 DIAGNOSIS — Z825 Family history of asthma and other chronic lower respiratory diseases: Secondary | ICD-10-CM | POA: Diagnosis not present

## 2019-07-07 DIAGNOSIS — E7800 Pure hypercholesterolemia, unspecified: Secondary | ICD-10-CM | POA: Diagnosis present

## 2019-07-07 DIAGNOSIS — G40909 Epilepsy, unspecified, not intractable, without status epilepticus: Secondary | ICD-10-CM | POA: Diagnosis present

## 2019-07-07 DIAGNOSIS — F329 Major depressive disorder, single episode, unspecified: Secondary | ICD-10-CM | POA: Diagnosis present

## 2019-07-07 DIAGNOSIS — G629 Polyneuropathy, unspecified: Secondary | ICD-10-CM | POA: Diagnosis present

## 2019-07-07 DIAGNOSIS — E785 Hyperlipidemia, unspecified: Secondary | ICD-10-CM | POA: Diagnosis present

## 2019-07-07 LAB — CBC
HCT: 40.9 % (ref 39.0–52.0)
Hemoglobin: 12.7 g/dL — ABNORMAL LOW (ref 13.0–17.0)
MCH: 27.4 pg (ref 26.0–34.0)
MCHC: 31.1 g/dL (ref 30.0–36.0)
MCV: 88.1 fL (ref 80.0–100.0)
Platelets: 231 10*3/uL (ref 150–400)
RBC: 4.64 MIL/uL (ref 4.22–5.81)
RDW: 14.4 % (ref 11.5–15.5)
WBC: 9.2 10*3/uL (ref 4.0–10.5)
nRBC: 0 % (ref 0.0–0.2)

## 2019-07-07 LAB — BASIC METABOLIC PANEL
Anion gap: 10 (ref 5–15)
Anion gap: 9 (ref 5–15)
Anion gap: 9 (ref 5–15)
BUN: 9 mg/dL (ref 8–23)
BUN: 9 mg/dL (ref 8–23)
BUN: 10 mg/dL (ref 8–23)
CO2: 29 mmol/L (ref 22–32)
CO2: 29 mmol/L (ref 22–32)
CO2: 29 mmol/L (ref 22–32)
Calcium: 8.9 mg/dL (ref 8.9–10.3)
Calcium: 8.9 mg/dL (ref 8.9–10.3)
Calcium: 8.8 mg/dL — ABNORMAL LOW (ref 8.9–10.3)
Chloride: 93 mmol/L — ABNORMAL LOW (ref 98–111)
Chloride: 93 mmol/L — ABNORMAL LOW (ref 98–111)
Chloride: 94 mmol/L — ABNORMAL LOW (ref 98–111)
Creatinine, Ser: 0.49 mg/dL — ABNORMAL LOW (ref 0.61–1.24)
Creatinine, Ser: 0.48 mg/dL — ABNORMAL LOW (ref 0.61–1.24)
Creatinine, Ser: 0.46 mg/dL — ABNORMAL LOW (ref 0.61–1.24)
GFR calc Af Amer: >60 mL/min (ref >60)
GFR calc Af Amer: >60 mL/min (ref >60)
GFR calc Af Amer: >60 mL/min (ref >60)
GFR calc non Af Amer: >60 mL/min (ref >60)
GFR calc non Af Amer: >60 mL/min (ref >60)
GFR calc non Af Amer: >60 mL/min (ref >60)
Glucose, Bld: 128 mg/dL — ABNORMAL HIGH (ref 70–99)
Glucose, Bld: 167 mg/dL — ABNORMAL HIGH (ref 70–99)
Glucose, Bld: 146 mg/dL — ABNORMAL HIGH (ref 70–99)
Potassium: 4 mmol/L (ref 3.5–5.1)
Potassium: 3.9 mmol/L (ref 3.5–5.1)
Potassium: 3.9 mmol/L (ref 3.5–5.1)
Sodium: 132 mmol/L — ABNORMAL LOW (ref 135–145)
Sodium: 131 mmol/L — ABNORMAL LOW (ref 135–145)
Sodium: 132 mmol/L — ABNORMAL LOW (ref 135–145)

## 2019-07-07 LAB — C-REACTIVE PROTEIN: CRP: 0.8 mg/dL (ref <1.0)

## 2019-07-07 LAB — HIV ANTIBODY (ROUTINE TESTING W REFLEX): HIV Screen 4th Generation wRfx: NONREACTIVE

## 2019-07-07 LAB — OSMOLALITY, URINE: Osmolality, Ur: 200 mOsm/kg — ABNORMAL LOW (ref 300–900)

## 2019-07-07 LAB — GLUCOSE, CAPILLARY
Glucose-Capillary: 121 mg/dL — ABNORMAL HIGH (ref 70–99)
Glucose-Capillary: 130 mg/dL — ABNORMAL HIGH (ref 70–99)
Glucose-Capillary: 156 mg/dL — ABNORMAL HIGH (ref 70–99)
Glucose-Capillary: 104 mg/dL — ABNORMAL HIGH (ref 70–99)
Glucose-Capillary: 125 mg/dL — ABNORMAL HIGH (ref 70–99)

## 2019-07-07 LAB — MRSA PCR SCREENING: MRSA by PCR: NEGATIVE

## 2019-07-07 MED ORDER — NETARSUDIL-LATANOPROST 0.02-0.005 % OP SOLN: 1.0000 [drp] | Freq: Every day | OPHTHALMIC | Status: DC

## 2019-07-07 MED ORDER — BRIMONIDINE TARTRATE 0.2 % OP SOLN
1.0000 [drp] | Freq: Three times a day (TID) | OPHTHALMIC | Status: DC
  Administered 2019-07-07 – 2019-07-09 (×6): 1 [drp] via OPHTHALMIC
  Filled 2019-07-07: qty 5

## 2019-07-07 MED ORDER — LORATADINE 10 MG PO TABS: 10.0000 mg | ORAL_TABLET | Freq: Every day | ORAL | Status: DC | PRN

## 2019-07-07 NOTE — Discharge Summary (Addendum)
Hospitalist progress note  Xavier Turner  [id number] DOB: 05-Oct-1955 DOA: 07/06/2019 PCP: Harden Mo, MD  Narrative:  63 year old white male Sturge-Weber syndrome DM TY 2 HLD HTN OSA left foot drop which is chronic uses cane at baseline admitted with left lower extremity pain Rx recently 10/6 cellulitis failed outpatient management Assessment & Plan: Failed outpatient management cellulitis-redness greatly improved-doubt this is DVT given some improvement-no need Doppler ultrasound continue IV ceftriaxone at this time and monitor trends TY 2 DM-continue sliding scale coverage-Home meds Metformin 1000 twice daily Januvia 100 daily continue gabapentin for neuropathy-CBG well controlled 1 56-1 60 Hypervolemic hyponatremia-probably mediated by increase fluid intake restrict fluids urine sodium not done however osmolality is 276 less than 285 therefore hyponatremia is probably from volume overload especially given physical findings BPH continue Flomax 0.4 Depression continue Lexapro 10 daily HTN continue hydrochlorothiazide 25 daily HLD continue Crestor 20 daily Glaucoma-continue eyedrops as needed  DVT prophylaxis: Lovenox  Code Status:   Full   Family Communication:   None Disposition Plan: Inpatient  Consultants:   None Procedures:   No Antimicrobials:   Ceftriaxone Subjective: Still has quite a bit of pain was not really able to bear much weight No chest pain no fever no chills No nausea no vomiting Tolerating diet Slightly somnolent at times Objective: Vitals:   07/06/19 1727 07/06/19 2116 07/07/19 0512 07/07/19 1355  BP: (!) 143/82 120/72 108/65 131/75  Pulse:  73 65 73  Resp: 20 18 18 16   Temp: (!) 97.5 F (36.4 C) 98.4 F (36.9 C) (!) 97.5 F (36.4 C) 97.8 F (36.6 C)  TempSrc: Oral Oral Oral Oral  SpO2: 97% 96% 96% 95%    Intake/Output Summary (Last 24 hours) at 07/07/2019 1441 Last data filed at 07/07/2019 1355 Gross per 24 hour  Intake 2074.44 ml   Output 2775 ml  Net -700.56 ml   There were no vitals filed for this visit.  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 very poor dentition Chest clear no added sound no rales no rhonchi S1-S2 no murmur rub or gallop Abdomen soft no rebound or guarding foot drop on the left side Edema not noted on the right side although redness is now only halfway up the calf whereas marking denotes it being up to the thigh  Data Reviewed: I have personally reviewed following labs and imaging studies CBC: Recent Labs  Lab 07/06/19 1215 07/07/19 0401  WBC 9.6 9.2  NEUTROABS 7.1  --   HGB 12.0* 12.7*  HCT 38.4* 40.9  MCV 88.1 88.1  PLT 212 [id number]   Basic Metabolic Panel: Recent Labs  Lab 07/06/19 1215 07/06/19 2027 07/07/19 0401 07/07/19 0955  NA 128* 126* 132* 131*  K 4.0 3.4* 4.0 3.9  CL 93* 88* 93* 93*  CO2 26 30 29 29   GLUCOSE 147* 148* 128* 167*  BUN 12 10 9 9   CREATININE 0.44* 0.41* 0.49* 0.48*  CALCIUM 8.6* 8.4* 8.9 8.9   GFR: CrCl cannot be calculated (Unknown ideal weight.). Liver Function Tests: No results for input(s): AST, ALT, ALKPHOS, BILITOT, PROT, ALBUMIN in the last 168 hours. No results for input(s): LIPASE, AMYLASE in the last 168 hours. No results for input(s): AMMONIA in the last 168 hours. Coagulation Profile: No results for input(s): INR, PROTIME in the last 168 hours. Cardiac Enzymes: Radiology Studies: Reviewed images personally in health database  Scheduled Meds: . aspirin EC  81 mg Oral Daily  . docusate sodium  100 mg Oral  BID  . dorzolamide-timolol  1 drop Right Eye BID  . enoxaparin (LOVENOX) injection  40 mg Subcutaneous Q24H  . escitalopram  10 mg Oral Daily  . gabapentin  100 mg Oral BID  . insulin aspart  0-5 Units Subcutaneous QHS  . insulin aspart  0-9 Units Subcutaneous TID WC  . insulin aspart  3 Units Subcutaneous TID WC  . pantoprazole  40 mg Oral Daily  . rosuvastatin  20 mg Oral Daily  . sodium chloride flush  3 mL  Intravenous Q12H  . tamsulosin  0.4 mg Oral Daily   Continuous Infusions: . sodium chloride 500 mL (07/06/19 1212)  . cefTRIAXone (ROCEPHIN)  IV      LOS: 0 days   Time spent: [place], MD [facility]  If 7PM-7AM, please contact night-coverage-look on AMION to find my number otherwise-prefer pages-not epic chat,please 07/07/2019, 2:41 PM

## 2019-07-07 NOTE — Evaluation (Signed)
Occupational Therapy Evaluation Patient Details Name: Xavier Turner MRN: [id number] DOB: 05-15-1956 Today's Date: 07/07/2019    History of Present Illness Xavier Turner is a 63 y.o. male with Past medical history of type II DM, HLD, HTN, OSA, Sturge-Weber syndrome involving face.Patient presents with complaints of worsening redness of the left leg + cellulitis   Clinical Impression   Pt was admitted for the above. He lives in a group home, uses a quad cane and has help with showering.  Pt now needs mostly min guard to min A for adls. Will follow in acute setting with supervision level goals.     Follow Up Recommendations  Home health OT;Supervision - Intermittent(for adls and mobility)    Equipment Recommendations  None recommended by OT    Recommendations for Other Services       Precautions / Restrictions Precautions Precautions: Fall Precaution Comments: L UE hemiplegia (congenital) Required Braces or Orthoses: Other Brace Other Brace: afo Restrictions Weight Bearing Restrictions: No      Mobility Bed Mobility Overal bed mobility: Modified Independent             General bed mobility comments: oob  Transfers Overall transfer level: Needs assistance Equipment used: Quad cane Transfers: Sit to/from [facility] Sit to Stand: Forensic psychologist)        General transfer comment: used AFO and shoes; min guard for safety; some unsteadiness but no LOB    Balance Overall balance assessment: Needs assistance Sitting-balance support: No upper extremity supported;Feet supported Sitting balance-Leahy Scale: Good     Standing balance support: Single extremity supported Standing balance-Leahy Scale: Poor Standing balance comment: decreased balance due to pain with WB on Left LE                           ADL either performed or assessed with clinical judgement   ADL Overall ADL's : Needs assistance/impaired Eating/Feeding: Set up    Grooming: Oral care;Min guard;Standing   Upper Body Bathing: Minimal assistance   Lower Body Bathing: Supervison/ safety   Upper Body Dressing : Set up   Lower Body Dressing: Minimal assistance;Sit to/from stand Lower Body Dressing Details (indicate cue type and reason): assist for sock and shoe on L Toilet Transfer: Min Psychiatric nurse Details (indicate cue type and reason): for safety (chair) Toileting- Clothing Manipulation and Hygiene: Minimal assistance Toileting - Clothing Manipulation Details (indicate cue type and reason): for urinal; urgency       General ADL Comments: pt stood at sink for oral care, donned socks shoes, used urinal, performed peri care and donned new gown     Vision         Perception     Praxis      Pertinent Vitals/Pain Pain Assessment: Faces Pain Score: 10-Worst pain ever Faces Pain Scale: Hurts little more Pain Location: left LE Pain Descriptors / Indicators: Discomfort Pain Intervention(s): Limited activity within patient's tolerance;Monitored during session;Repositioned     Hand Dominance Right   Extremity/Trunk Assessment Upper Extremity Assessment Upper Extremity Assessment: Generalized weakness;LUE deficits/detail LUE Deficits / Details: uses as gross assist; holds toothbrush, stabilized on counter. Able to lift arm a little for adls          Communication Communication Communication: No difficulties   Cognition Arousal/Alertness: Awake/alert Behavior During Therapy: WFL for tasks assessed/performed Overall Cognitive Status: Within Functional Limits for tasks assessed  General Comments  Increased edema and redness in L LE    Exercises     Shoulder Instructions      Home Living Family/patient expects to be discharged to:: Group home Living Arrangements: Group Home                               Additional Comments: has help to shower; he can call  if he needs other assistance      Prior Functioning/Environment Level of Independence: Independent with assistive device(s)        Comments: I with basic mobility with SBQC and adls except for showering        OT Problem List: Decreased strength;Decreased activity tolerance;Impaired balance (sitting and/or standing);Pain;Impaired UE functional use      OT Treatment/Interventions: Self-care/ADL training;DME and/or AE instruction;Patient/family education;Balance training;Therapeutic activities    OT Goals(Current goals can be found in the care plan section) Acute Rehab OT Goals Patient Stated Goal: To return to group home and be ablt to walk OT Goal Formulation: With patient Time For Goal Achievement: 07/21/19 Potential to Achieve Goals: Good ADL Goals Pt Will Perform Grooming: with supervision;standing Pt Will Transfer to Toilet: with supervision;ambulating;bedside commode;regular height toilet Pt Will Perform Toileting - Clothing Manipulation and hygiene: with supervision;sit to/from stand Additional ADL Goal #1: pt will perform LB adls with set up/supervision (with long shoehorn as needed)  OT Frequency: Min 2X/week   Barriers to D/C:            Co-evaluation              AM-PAC OT "6 Clicks" Daily Activity     Outcome Measure Help from another person eating meals?: A Little Help from another person taking care of personal grooming?: A Little Help from another person toileting, which includes using toliet, bedpan, or urinal?: A Little Help from another person bathing (including washing, rinsing, drying)?: A Little Help from another person to put on and taking off regular upper body clothing?: A Little Help from another person to put on and taking off regular lower body clothing?: A Little 6 Click Score: 18   End of Session    Activity Tolerance: Patient tolerated treatment well Patient left: in chair;with call bell/phone within reach  OT Visit Diagnosis:  Unsteadiness on feet (R26.81);Muscle weakness (generalized) (M62.81)                Time: [id number] OT Time Calculation (min): 25 min Charges:  OT General Charges $OT Visit: 1 Visit OT Evaluation $OT Eval Low Complexity: 1 Low OT Treatments $Self Care/Home Management : 8-22 mins  Lesle Chris, OTR/L Acute Rehabilitation Services [phone] [facility] pager [phone] office 07/07/2019  Xavier Turner 07/07/2019, 12:54 PM

## 2019-07-07 NOTE — Evaluation (Signed)
Physical Therapy Evaluation Patient Details Name: Xavier Turner MRN: [id number] DOB: [birth year] Today's Date: 07/07/2019   History of Present Illness  Xavier Turner is a 63 y.o. male with Past medical history of type II DM, HLD, HTN, OSA, Sturge-Weber syndrome involving face.Patient presents with complaints of worsening redness of the left leg + cellulitis  Clinical Impression  Pt presents with dependencies in mobility affecting his independence secondary to the above diagnosis.Pt has pain 10/10 in L LE with mobility. Pt would benefit from acute skilled PT to maximize mobility and Independence for return to group home. Pt reports they will probably be able to increase level of care at facility. Recommend HHPT services.    Follow Up Recommendations Home health PT;Supervision for mobility/OOB    Equipment Recommendations  None recommended by PT    Recommendations for Other Services       Precautions / Restrictions Precautions Precautions: Fall Precaution Comments: L UE hemiplegia (congenital) Required Braces or Orthoses: Other Brace Other Brace: afo Restrictions Weight Bearing Restrictions: No      Mobility  Bed Mobility Overal bed mobility: Modified Independent             General bed mobility comments: use of bed rail  Transfers Overall transfer level: Needs assistance Equipment used: Quad cane;1 person hand held assist Transfers: Sit to/from [employer] to Stand: Min assist Stand pivot transfers: Min assist       General transfer comment: decreased weight shift over L LE due to pain, initially LOB posteriorly requiring therapist assist for balance.  Ambulation/Gait             General Gait Details: NT due to breakfast arriving  Stairs            Wheelchair Mobility    Modified Rankin (Stroke Patients Only)       Balance Overall balance assessment: Needs assistance Sitting-balance support: No upper extremity  supported;Feet supported Sitting balance-Leahy Scale: Good     Standing balance support: Single extremity supported Standing balance-Leahy Scale: Poor Standing balance comment: decreased balance due to pain with WB on Left LE                             Pertinent Vitals/Pain Pain Assessment: 0-10 Pain Score: 10-Worst pain ever Pain Location: left LE Pain Descriptors / Indicators: Discomfort Pain Intervention(s): Limited activity within patient's tolerance;Monitored during session;Repositioned    Home Living Family/patient expects to be discharged to:: Group home Living Arrangements: Group Home                    Prior Function Level of Independence: Independent with assistive device(s)         Comments: I with basic mobility with SBQC     Hand Dominance   Dominant Hand: Right    Extremity/Trunk Assessment   Upper Extremity Assessment Upper Extremity Assessment: Defer to OT evaluation    Lower Extremity Assessment Lower Extremity Assessment: LLE deficits/detail LLE Deficits / Details: Ankle NT due to pain, hip/knee WFL LLE: Unable to fully assess due to pain       Communication   Communication: No difficulties  Cognition Arousal/Alertness: Awake/alert Behavior During Therapy: WFL for tasks assessed/performed Overall Cognitive Status: Within Functional Limits for tasks assessed  General Comments General comments (skin integrity, edema, etc.): Increased edema and redness in L LE    Exercises     Assessment/Plan    PT Assessment Patient needs continued PT services  PT Problem List Pain;Decreased range of motion;Decreased activity tolerance;Decreased balance;Decreased safety awareness;Decreased mobility;Decreased knowledge of precautions;Decreased strength       PT Treatment Interventions DME instruction;Therapeutic exercise;Gait training;Balance training;Neuromuscular  re-education;Functional mobility training;Therapeutic activities;Patient/family education    PT Goals (Current goals can be found in the Care Plan section)  Acute Rehab PT Goals Patient Stated Goal: To return to group home and be ablt to walk PT Goal Formulation: With patient Time For Goal Achievement: 07/21/19 Potential to Achieve Goals: Good    Frequency Min 3X/week   Barriers to discharge        Co-evaluation               AM-PAC PT "6 Clicks" Mobility  Outcome Measure Help needed turning from your back to your side while in a flat bed without using bedrails?: A Little Help needed moving from lying on your back to sitting on the side of a flat bed without using bedrails?: A Little Help needed moving to and from a bed to a chair (including a wheelchair)?: A Little Help needed standing up from a chair using your arms (e.g., wheelchair or bedside chair)?: A Little Help needed to walk in hospital room?: A Little Help needed climbing 3-5 steps with a railing? : A Lot 6 Click Score: 17    End of Session Equipment Utilized During Treatment: Gait belt Activity Tolerance: Patient tolerated treatment well;Patient limited by pain Patient left: in chair;with call bell/phone within reach Nurse Communication: Mobility status PT Visit Diagnosis: Other abnormalities of gait and mobility (R26.89);Pain Pain - Right/Left: Left Pain - part of body: Leg    Time: [id number] PT Time Calculation (min) (ACUTE ONLY): 18 min   Charges:   PT Evaluation $PT Eval Moderate Complexity: 1 Mod          Rosamae Rocque [place], PT  Lelon Mast 07/07/2019, 9:10 AM

## 2019-07-08 DIAGNOSIS — E871 Hypo-osmolality and hyponatremia: Secondary | ICD-10-CM

## 2019-07-08 DIAGNOSIS — E119 Type 2 diabetes mellitus without complications: Secondary | ICD-10-CM | POA: Diagnosis present

## 2019-07-08 DIAGNOSIS — K219 Gastro-esophageal reflux disease without esophagitis: Secondary | ICD-10-CM

## 2019-07-08 DIAGNOSIS — E1169 Type 2 diabetes mellitus with other specified complication: Secondary | ICD-10-CM

## 2019-07-08 DIAGNOSIS — E785 Hyperlipidemia, unspecified: Secondary | ICD-10-CM

## 2019-07-08 DIAGNOSIS — N400 Enlarged prostate without lower urinary tract symptoms: Secondary | ICD-10-CM

## 2019-07-08 LAB — URINALYSIS, ROUTINE W REFLEX MICROSCOPIC
Bilirubin Urine: NEGATIVE
Glucose, UA: NEGATIVE mg/dL
Hgb urine dipstick: NEGATIVE
Ketones, ur: 5 mg/dL — AB
Nitrite: NEGATIVE
Protein, ur: NEGATIVE mg/dL
Specific Gravity, Urine: 1.02 (ref 1.005–1.030)
pH: 6 (ref 5.0–8.0)

## 2019-07-08 LAB — CBC WITH DIFFERENTIAL/PLATELET
Abs Immature Granulocytes: 0.24 10*3/uL — ABNORMAL HIGH (ref 0.00–0.07)
Basophils Absolute: 0.1 10*3/uL (ref 0.0–0.1)
Basophils Relative: 1 %
Eosinophils Absolute: 0.2 10*3/uL (ref 0.0–0.5)
Eosinophils Relative: 2 %
HCT: 43.7 % (ref 39.0–52.0)
Hemoglobin: 13.5 g/dL (ref 13.0–17.0)
Immature Granulocytes: 3 %
Lymphocytes Relative: 21 %
Lymphs Abs: 2 10*3/uL (ref 0.7–4.0)
MCH: 27.3 pg (ref 26.0–34.0)
MCHC: 30.9 g/dL (ref 30.0–36.0)
MCV: 88.3 fL (ref 80.0–100.0)
Monocytes Absolute: 0.6 10*3/uL (ref 0.1–1.0)
Monocytes Relative: 6 %
Neutro Abs: 6.3 10*3/uL (ref 1.7–7.7)
Neutrophils Relative %: 67 %
Platelets: 241 10*3/uL (ref 150–400)
RBC: 4.95 MIL/uL (ref 4.22–5.81)
RDW: 14.4 % (ref 11.5–15.5)
WBC: 9.3 10*3/uL (ref 4.0–10.5)
nRBC: 0 % (ref 0.0–0.2)

## 2019-07-08 LAB — BASIC METABOLIC PANEL
Anion gap: 10 (ref 5–15)
BUN: 12 mg/dL (ref 8–23)
CO2: 26 mmol/L (ref 22–32)
Calcium: 8.7 mg/dL — ABNORMAL LOW (ref 8.9–10.3)
Chloride: 96 mmol/L — ABNORMAL LOW (ref 98–111)
Creatinine, Ser: 0.54 mg/dL — ABNORMAL LOW (ref 0.61–1.24)
GFR calc Af Amer: >60 mL/min (ref >60)
GFR calc non Af Amer: >60 mL/min (ref >60)
Glucose, Bld: 106 mg/dL — ABNORMAL HIGH (ref 70–99)
Potassium: 3.9 mmol/L (ref 3.5–5.1)
Sodium: 132 mmol/L — ABNORMAL LOW (ref 135–145)

## 2019-07-08 LAB — GLUCOSE, CAPILLARY
Glucose-Capillary: 119 mg/dL — ABNORMAL HIGH (ref 70–99)
Glucose-Capillary: 186 mg/dL — ABNORMAL HIGH (ref 70–99)
Glucose-Capillary: 119 mg/dL — ABNORMAL HIGH (ref 70–99)
Glucose-Capillary: 177 mg/dL — ABNORMAL HIGH (ref 70–99)

## 2019-07-08 LAB — OSMOLALITY: Osmolality: 284 mOsm/kg (ref 275–295)

## 2019-07-08 MED ORDER — CEFDINIR 300 MG PO CAPS: 300.0000 mg | ORAL_CAPSULE | Freq: Two times a day (BID) | ORAL | 0 refills | Status: AC

## 2019-07-08 MED ORDER — CEFDINIR 300 MG PO CAPS
300.0000 mg | ORAL_CAPSULE | Freq: Two times a day (BID) | ORAL | Status: DC
  Administered 2019-07-08 – 2019-07-09 (×2): 300 mg via ORAL
  Filled 2019-07-08 (×3): qty 1

## 2019-07-08 NOTE — Progress Notes (Signed)
Occupational Therapy Treatment Patient Details Name: Xavier Turner MRN: [id number] DOB: [birth year] Today's Date: 07/08/2019    History of present illness Xavier Turner is a 63 y.o. male with Past medical history of type II DM, HLD, HTN, OSA, Sturge-Weber syndrome involving face.Patient presents with complaints of worsening redness of the left leg + cellulitis   OT comments  Pt needed a little more assistance today than yesterday; first transfer OOB today and needed more assistance with socks/shoes from an unsupported position.    Follow Up Recommendations  Home health OT;Supervision - Intermittent    Equipment Recommendations  None recommended by OT    Recommendations for Other Services      Precautions / Restrictions Precautions Precautions: Fall Precaution Comments: L UE hemiplegia (congenital) Other Brace: afo Restrictions Weight Bearing Restrictions: No       Mobility Bed Mobility               General bed mobility comments: min guard, using bedrail. Pt sleeps in a regular bed at baseline  Transfers   Equipment used: Quad cane   Sit to Stand: Min assist Stand pivot transfers: Min assist       General transfer comment: min A to stand from bed; min guard for safety wtih spt    Balance                                           ADL either performed or assessed with clinical judgement   ADL               Lower Body Bathing: Minimal assistance;Sit to/from stand   Upper Body Dressing : Minimal assistance   Lower Body Dressing: Minimal assistance;Moderate assistance Lower Body Dressing Details (indicate cue type and reason): for shoes from unsupported sitting Toilet Transfer: Minimal assistance;Stand-pivot;RW(chair)             General ADL Comments: pt's catheter came partially off.  Performed bathing,dressing.  More assistance than yesterday; had just gotten OOB     Vision       Perception     Praxis       Cognition Arousal/Alertness: Awake/alert Behavior During Therapy: WFL for tasks assessed/performed Overall Cognitive Status: Within Functional Limits for tasks assessed                                          Exercises     Shoulder Instructions       General Comments      Pertinent Vitals/ Pain       Pain Assessment: Faces Faces Pain Scale: No hurt  Home Living                                          Prior Functioning/Environment              Frequency  Min 2X/week        Progress Toward Goals  OT Goals(current goals can now be found in the care plan section)  Progress towards OT goals: Not progressing toward goals - comment(needed more assistance today)     Plan      Co-evaluation  AM-PAC OT "6 Clicks" Daily Activity     Outcome Measure   Help from another person eating meals?: A Little Help from another person taking care of personal grooming?: A Little Help from another person toileting, which includes using toliet, bedpan, or urinal?: A Little Help from another person bathing (including washing, rinsing, drying)?: A Little Help from another person to put on and taking off regular upper body clothing?: A Little Help from another person to put on and taking off regular lower body clothing?: A Lot 6 Click Score: 17    End of Session    OT Visit Diagnosis: Unsteadiness on feet (R26.81);Muscle weakness (generalized) (M62.81)   Activity Tolerance Patient tolerated treatment well   Patient Left in chair;with call bell/phone within reach   Nurse Communication          Time: [id number] OT Time Calculation (min): 22 min  Charges: OT General Charges $OT Visit: 1 Visit OT Treatments $Self Care/Home Management : 8-22 mins  Xavier Turner, OTR/L Acute Rehabilitation Services [phone] [place] pager [phone] office 07/08/2019   Xavier Turner 07/08/2019, 11:22 AM

## 2019-07-08 NOTE — Progress Notes (Signed)
[facility]  PROGRESS NOTE  [name] [id number] DOB: [birth year] DOA: 07/06/2019 PCP: Harden Mo, MD  Brief History    Xavier Turner is a 63 y.o. year old male with medical history significant for type II DM, HLD, HTN, OSA, Sturge-Weber syndrome involving face who initially presented on 10/6 for redness and pain of left leg and started on oral Keflex for presumed cellulitis in the setting of negative ultrasound Doppler.  Patient came back to the ED on 07/06/2019 Due to worsening redness, pain, and inability to bear weight on leg.  Patient was transitioned to IV ceftriaxone, MRSA PCR was negative. A & P    Left leg nonpurulent cellulitis, improving.  Redness and swelling  is improved per patient, able to bear weight while sitting down with brace in place.  Remains afebrile with normal white count and blood cultures unremarkable.  Will transition from IV ceftriaxone to cefdinir with close monitoring in hospital given recent readmission in the face of failure of oral antibiotics.  If continues to improve/remained stable clinically anticipate discharge in 24 hours back to group home.   Reported urine incontinence.  Reported by sister, patient does not endorse to me.  Will check UA for completion.  Closely monitor output.   Chronic hyponatremia, mild.  Closely monitor, remains asymptomatic, check serum osm. Hold home hctz,.   GERD, stable continue PPI   Hyperlipidemia, stable continue Crestor   BPH, stable continue Flomax   Hypertension, holding HCTZ in setting of chronic hyponatremia   Type 2 diabetes, well controlled, A1c 6.6, monitor CBG, sliding scale as needed.   Depression, stable continue Lexapro      DVT prophylaxis: Lovenox Code Status: Full Family Communication: Spoke with sister on phone Disposition Plan: Monitor clinical status while on oral antibiotics, close monitoring of sodium as well     [facility] Direct contact: see [email or web address]  (further directions at bottom of note if needed) 7PM-7AM contact night coverage as at bottom of note 07/08/2019, 6:07 PM  LOS: 1 day   Consultants  None Procedures  . None  Antibiotics  . IV ceftriaxone . 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 Slightly tender with palpation, erythema surrounding left calf-retracted from previous skin markings near thigh, no open lesions, slight edema   I have personally reviewed the following:   Data Reviewed: Basic Metabolic Panel: Recent Labs  Lab 07/06/19 2027 07/07/19 0401 07/07/19 0955 07/07/19 1446 07/08/19 0330  NA 126* 132* 131* 132* 132*  K 3.4* 4.0 3.9 3.9 3.9  CL 88* 93* 93* 94* 96*  CO2 30 29 29 29 26   GLUCOSE 148* 128* 167* 146* 106*  BUN 10 9 9 10 12   CREATININE 0.41* 0.49* 0.48* 0.46* 0.54*  CALCIUM 8.4* 8.9 8.9 8.8* 8.7*   Liver Function Tests: No results for input(s): AST,  ALT, ALKPHOS, BILITOT, PROT, ALBUMIN in the last 168 hours. No results for input(s): LIPASE, AMYLASE in the last 168 hours. No results for input(s): AMMONIA in the last 168 hours. CBC: Recent Labs  Lab 07/06/19 1215 07/07/19 0401 07/08/19 0330  WBC 9.6 9.2 9.3  NEUTROABS 7.1  --  6.3  HGB 12.0* 12.7* 13.5  HCT 38.4* 40.9 43.7  MCV 88.1 88.1 88.3  PLT 212 231 241   Cardiac Enzymes: Recent Labs  Lab 07/06/19 1813  CKTOTAL 33*    BNP (last 3 results) No results for input(s): BNP in the last 8760 hours.  ProBNP (last 3 results) No results for input(s): PROBNP in the last 8760 hours.  CBG: Recent Labs  Lab 07/07/19 1624 07/07/19 2206 07/08/19 0743 07/08/19 1130 07/08/19 1645  GLUCAP 104* 125* 119* 186* 119*    Recent Results (from the past 240 hour(s))  Blood culture (routine x 2)     Status: None (Preliminary result)   Collection Time: 07/06/19 12:15 PM   Specimen: BLOOD  Result Value Ref Range Status   Specimen Description   Final    BLOOD SITE NOT SPECIFIED Performed at [facility] [address] [place], [place] [address]    Special Requests   Final    BOTTLES DRAWN AEROBIC AND ANAEROBIC Blood Culture adequate volume Performed at [facility] [address] [place], [place] [address]    Culture   Final    NO GROWTH 1 DAY Performed at [facility], [place] [address], [place], [place] [address]    Report Status PENDING  Incomplete  Blood culture (routine x 2)     Status: None (Preliminary result)   Collection Time: 07/06/19 12:16 PM   Specimen: BLOOD RIGHT FOREARM  Result Value Ref Range Status   Specimen Description   Final    BLOOD RIGHT FOREARM Performed at [facility] [address], [place], [place] [address]    Special Requests   Final    BOTTLES DRAWN AEROBIC AND ANAEROBIC Blood Culture adequate volume Performed at [facility] [address], [place], [place] [address]    Culture   Final    NO GROWTH 1 DAY Performed at [facility], [place] [address], [place],  [address]    Report Status PENDING  Incomplete  SARS CORONAVIRUS 2 (TAT 6-24 HRS) Nasopharyngeal Nasopharyngeal Swab     Status: None   Collection Time: 07/06/19  1:00 PM   Specimen: Nasopharyngeal Swab  Result Value Ref Range Status   SARS Coronavirus 2 NEGATIVE NEGATIVE Final    Comment: (NOTE) SARS-CoV-2 target nucleic acids are  NOT DETECTED. The SARS-CoV-2 RNA is generally detectable in upper and lower respiratory specimens during the acute phase of infection. Negative results do not preclude SARS-CoV-2 infection, do not rule out co-infections with other pathogens, and should not be used as the sole basis for treatment or other patient management decisions. Negative results must be combined with clinical observations, patient history, and epidemiological information. The expected result is Negative. Fact Sheet for Patients: SugarRoll.be Fact Sheet for Healthcare Providers: https://www.woods-mathews.com/ This test is not yet approved or cleared by the [place] FDA and  has been authorized for detection and/or diagnosis of SARS-CoV-2 by FDA under an Emergency Use Authorization (EUA). This EUA will remain  in effect (meaning this test can be used) for the duration of the COVID-19 declaration under Section 56 4(b)(1) of the Act, 21 U.S.C.  section 360bbb-3(b)(1), unless the authorization is terminated or revoked sooner. Performed at [facility], [place] [address], [place], [place] [address]   MRSA PCR Screening     Status: None   Collection Time: 07/07/19  6:51 AM   Specimen: Nasal Mucosa; Nasopharyngeal  Result Value Ref Range Status   MRSA by PCR NEGATIVE NEGATIVE Final    Comment:        The GeneXpert MRSA Assay (FDA approved for NASAL specimens only), is one component of a comprehensive MRSA colonization surveillance program. It is not intended to diagnose MRSA infection nor to guide or monitor treatment for MRSA infections. Performed at [facility], [place] [address], [place], [place] [address]      Studies: No results found.  Scheduled Meds: . aspirin EC  81 mg Oral Daily  . brimonidine  1 drop Both Eyes TID  . cefdinir  300 mg Oral Q12H  . docusate sodium  100 mg Oral BID  . dorzolamide-timolol  1 drop Right Eye BID  .  enoxaparin (LOVENOX) injection  40 mg Subcutaneous Q24H  . escitalopram  10 mg Oral Daily  . gabapentin  100 mg Oral BID  . insulin aspart  0-5 Units Subcutaneous QHS  . insulin aspart  0-9 Units Subcutaneous TID WC  . insulin aspart  3 Units Subcutaneous TID WC  . Netarsudil-Latanoprost  1 drop Both Eyes QHS  . pantoprazole  40 mg Oral Daily  . rosuvastatin  20 mg Oral Daily  . sodium chloride flush  3 mL Intravenous Q12H  . tamsulosin  0.4 mg Oral Daily   Continuous Infusions: . sodium chloride 500 mL (07/06/19 1212)    Active Problems:   Cellulitis      Waldron Session Daivik Overley  [facility]

## 2019-07-09 LAB — BASIC METABOLIC PANEL
Anion gap: 10 (ref 5–15)
BUN: 16 mg/dL (ref 8–23)
CO2: 22 mmol/L (ref 22–32)
Calcium: 8.4 mg/dL — ABNORMAL LOW (ref 8.9–10.3)
Chloride: 99 mmol/L (ref 98–111)
Creatinine, Ser: 0.5 mg/dL — ABNORMAL LOW (ref 0.61–1.24)
GFR calc Af Amer: >60 mL/min (ref >60)
GFR calc non Af Amer: >60 mL/min (ref >60)
Glucose, Bld: 151 mg/dL — ABNORMAL HIGH (ref 70–99)
Potassium: 4 mmol/L (ref 3.5–5.1)
Sodium: 131 mmol/L — ABNORMAL LOW (ref 135–145)

## 2019-07-09 LAB — CBC
HCT: 40.6 % (ref 39.0–52.0)
Hemoglobin: 12.7 g/dL — ABNORMAL LOW (ref 13.0–17.0)
MCH: 27.7 pg (ref 26.0–34.0)
MCHC: 31.3 g/dL (ref 30.0–36.0)
MCV: 88.6 fL (ref 80.0–100.0)
Platelets: 226 10*3/uL (ref 150–400)
RBC: 4.58 MIL/uL (ref 4.22–5.81)
RDW: 14.6 % (ref 11.5–15.5)
WBC: 8.7 10*3/uL (ref 4.0–10.5)
nRBC: 0 % (ref 0.0–0.2)

## 2019-07-09 LAB — GLUCOSE, CAPILLARY
Glucose-Capillary: 124 mg/dL — ABNORMAL HIGH (ref 70–99)
Glucose-Capillary: 204 mg/dL — ABNORMAL HIGH (ref 70–99)

## 2019-07-09 NOTE — Discharge Instructions (Signed)
Take cefdenir 300 mg every 12 hours ( from 07/08/19-07/14/2019) for treatment of the cellulitis infection in your left leg  Cellulitis, Adult  Cellulitis is a skin infection. The infected area is often warm, red, swollen, and sore. It occurs most often in the arms and lower legs. It is very important to get treated for this condition. What are the causes? This condition is caused by bacteria. The bacteria enter through a break in the skin, such as a cut, burn, insect bite, open sore, or crack. What increases the risk? This condition is more likely to occur in people who:  Have a weak body defense system (immune system).  Have open cuts, burns, bites, or scrapes on the skin.  Are older than 64 years of age.  Have a blood sugar problem (diabetes).  Have a long-lasting (chronic) liver disease (cirrhosis) or kidney disease.  Are very overweight (obese).  Have a skin problem, such as: ? Itchy rash (eczema). ? Slow movement of blood in the veins (venous stasis). ? Fluid buildup below the skin (edema).  Have been treated with high-energy rays (radiation).  Use IV drugs. What are the signs or symptoms? Symptoms of this condition include:  Skin that is: ? Red. ? Streaking. ? Spotting. ? Swollen. ? Sore or painful when you touch it. ? Warm.  A fever.  Chills.  Blisters. How is this diagnosed? This condition is diagnosed based on:  Medical history.  Physical exam.  Blood tests.  Imaging tests. How is this treated? Treatment for this condition may include:  Medicines to treat infections or allergies.  Home care, such as: ? Rest. ? Placing cold or warm cloths (compresses) on the skin.  Hospital care, if the condition is very bad. Follow these instructions at home: Medicines  Take over-the-counter and prescription medicines only as told by your doctor.  If you were prescribed an antibiotic medicine, take it as told by your doctor. Do not stop taking it even if  you start to feel better. General instructions   Drink enough fluid to keep your pee (urine) pale yellow.  Do not touch or rub the infected area.  Raise (elevate) the infected area above the level of your heart while you are sitting or lying down.  Place cold or warm cloths on the area as told by your doctor.  Keep all follow-up visits as told by your doctor. This is important. Contact a doctor if:  You have a fever.  You do not start to get better after 1-2 days of treatment.  Your bone or joint under the infected area starts to hurt after the skin has healed.  Your infection comes back. This can happen in the same area or another area.  You have a swollen bump in the area.  You have new symptoms.  You feel ill and have muscle aches and pains. Get help right away if:  Your symptoms get worse.  You feel very sleepy.  You throw up (vomit) or have watery poop (diarrhea) for a long time.  You see red streaks coming from the area.  Your red area gets larger.  Your red area turns dark in color. These symptoms may represent a serious problem that is an emergency. Do not wait to see if the symptoms will go away. Get medical help right away. Call your local emergency services (911 in the [place] Do not drive yourself to the hospital. Take cefdenir 300 mg twice daily( every 12 hours)    Summary  Cellulitis is a skin infection. The area is often warm, red, swollen, and sore.  This condition is treated with medicines, rest, and cold and warm cloths.  Take all medicines only as told by your doctor.  Tell your doctor if symptoms do not start to get better after 1-2 days of treatment. This information is not intended to replace advice given to you by your health care provider. Make sure you discuss any questions you have with your health care provider. Document Released: 02/27/2008 Document Revised: 01/30/2018 Document Reviewed: 01/30/2018 Elsevier Patient Education  2020  [employer].

## 2019-07-09 NOTE — Progress Notes (Signed)
Physical Therapy Treatment Patient Details Name: Xavier Turner MRN: [id number] DOB: 02-23-1956 Today's Date: 07/09/2019    History of Present Illness Xavier Turner is a 63 y.o. male with Past medical history of type II DM, HLD, HTN, OSA, Sturge-Weber syndrome involving face.Patient presents with complaints of worsening redness of the left leg + cellulitis    PT Comments    Pt OOB in recliner.  Assisted with amb.  General Gait Details: tolerated an great distance.  Good safety cognition and self ability.General transfer comment: increased time to rise at [place].   Pt stated his sister "watches after me".  Mentioned something about his Group Home.    Follow Up Recommendations  Home health PT;Supervision for mobility/OOB     Equipment Recommendations  None recommended by PT    Recommendations for Other Services       Precautions / Restrictions Precautions Precautions: Fall Precaution Comments: L UE hemiplegia (congenital) Required Braces or Orthoses: Other Brace Other Brace: L shoe/AFO Restrictions Weight Bearing Restrictions: No    Mobility  Bed Mobility               General bed mobility comments: OOB in recliner  Transfers Overall transfer level: Needs assistance Equipment used: Quad cane Transfers: Sit to/from [employer] to Stand: Min guard Stand pivot transfers: Min guard       General transfer comment: increased time to rise at [place]  Ambulation/Gait Ambulation/Gait assistance: Min guard Gait Distance (Feet): 215 Feet Assistive device: Quad cane Gait Pattern/deviations: Step-to pattern;Decreased stance time - left Gait velocity: decreased but at prior gait speed   General Gait Details: tolerated an great distance.  Good safety cognition and self ability.   Stairs             Wheelchair Mobility    Modified Rankin (Stroke Patients Only)       Balance                                            Cognition Arousal/Alertness: Awake/alert Behavior During Therapy: WFL for tasks assessed/performed Overall Cognitive Status: Within Functional Limits for tasks assessed                                 General Comments: motivated and pleasant      Exercises      General Comments        Pertinent Vitals/Pain Pain Assessment: No/denies pain    Home Living                      Prior Function            PT Goals (current goals can now be found in the care plan section) Progress towards PT goals: Progressing toward goals    Frequency    Min 3X/week      PT Plan      Co-evaluation              AM-PAC PT "6 Clicks" Mobility   Outcome Measure  Help needed turning from your back to your side while in a flat bed without using bedrails?: A Little Help needed moving from lying on your back to sitting on the side of a flat bed without using bedrails?: A Little Help needed moving  to and from a bed to a chair (including a wheelchair)?: A Little Help needed standing up from a chair using your arms (e.g., wheelchair or bedside chair)?: A Little Help needed to walk in hospital room?: A Little Help needed climbing 3-5 steps with a railing? : A Lot 6 Click Score: 17    End of Session Equipment Utilized During Treatment: Gait belt Activity Tolerance: Patient tolerated treatment well Patient left: in chair;with call bell/phone within reach;with chair alarm set   PT Visit Diagnosis: Other abnormalities of gait and mobility (R26.89);Pain     Time: [id number] PT Time Calculation (min) (ACUTE ONLY): 17 min  Charges:  $Gait Training: 8-22 mins                     Rica Koyanagi  PTA Acute  Rehabilitation Services Pager      [phone] Office      [phone]

## 2019-07-09 NOTE — TOC Transition Note (Signed)
Transition of Care [facility]) - CM/SW Discharge Note   Patient Details  Name: Xavier Turner MRN: [id number] Date of Birth: [birth year]  [facility] CM/SW Contact:  Leeroy Cha, RN Phone Number: 07/09/2019, 1:01 PM   Clinical Narrative:    hhc orders sent to [facility]   Final next level of care: [place] Barriers to Discharge: No Barriers Identified   Patient Goals and CMS Choice Patient states their goals for this hospitalization and ongoing recovery are:: to go home CMS Medicare.gov Compare Post Acute Care list provided to:: Patient Choice offered to / list presented to : Patient  Discharge Placement                       Discharge Plan and Services   Discharge Planning Services: CM Consult Post Acute Care Choice: Home Health                    HH Arranged: PT, OT [facility] Agency: [place] ([facility]) Date [facility] Agency Contacted: 07/09/19 Time [place]: 72 Representative spoke with at [place]: [place] ([place]) Interventions     Readmission Risk Interventions No flowsheet data found.

## 2019-07-09 NOTE — Discharge Summary (Addendum)
Xavier Turner [id number] DOB: 29-Dec-1955 DOA: 07/06/2019  PCP: Harden Mo, MD  Admit date: 07/06/2019 Discharge date: 07/09/2019  Admitted From: Home Disposition: Home  Recommendations for Outpatient Follow-up:  1. Follow up with PCP in 1-2 weeks 2. Please obtain BMP/CBC in one week 3. New medication: Cefdinir x7 days 4. Please follow up on the following pending results:  Home Health:PT Equipment/Devices: none  Discharge Condition:Stable CODE STATUS: FULL Diet recommendation: Heart Healthy / Carb Modified / Regular / Dysphagia   Brief/Interim Summary: History of present illness:  Xavier Turner is a 63 y.o. year old male with medical history significant for type II DM, HLD, HTN, OSA, Sturge-Weber syndrome involving face who initially presented on 10/6 for redness and pain of left leg and started on oral Keflex for presumed cellulitis in the setting of negative ultrasound Doppler.  Patient came back to the ED on 07/06/2019 Due to worsening redness, pain, and inability to bear weight on leg.  Patient was transitioned to IV ceftriaxone, MRSA PCR was negative.  Remaining hospital course addressed in problem based format below:   Hospital Course:    Left leg nonpurulent cellulitis, improving.  Likely secondary to recent foot care ( had left foot corn removed).  Outpatient Keflex did not improve however notable improvement on IV ceftriaxone in hospital and maintained improvement on cefdinir during hospitalization.  Will continue on discharge for total of 7 days.  Patient remained afebrile, hemodynamically stable without leukocytosis and blood cultures were unremarkable during hospital stay.     Reported urine incontinence.  Reported by sister, patient does not endorse to me.    UA unremarkable Closely monitor output.   Chronic hyponatremia, mild.    Held home HCTZ during hospital stay, remained asymptomatic    GERD, stable continue PPI   Hyperlipidemia, stable continue  Crestor   BPH, stable continue Flomax   Hypertension, held HCTZ due to hyponatremia, this is a chronic problem, can resume HCTZ on discharge    Type 2 diabetes, well controlled, A1c 6.6, monitor CBG, sliding scale as needed.   Depression, stable continue Lexapro   Consultations:  None   ocedures/Studies: None Subjective: Ready to go home Feels significant improvement in foot pain, leg swelling and redness Discharge Exam: Vitals:   07/09/19 0518 07/09/19 1331  BP: 133/83 116/70  Pulse: 64 67  Resp: 18   Temp: 97.9 F (36.6 C) 98 F (36.7 C)  SpO2: 97% 96%   Vitals:   07/08/19 1518 07/08/19 2117 07/09/19 0518 07/09/19 1331  BP:  119/71 133/83 116/70  Pulse:  75 64 67  Resp:  18 18   Temp:  97.7 F (36.5 C) 97.9 F (36.6 C) 98 F (36.7 C)  TempSrc:  Oral Oral Oral  SpO2: 94% 95% 97% 96%    Awake Alert, Oriented X 3, No new F.N deficits, Normal affect 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 Left leg: Non tender, minimal erythema surrounding left calf- significantly retracted from previous skin markings near thigh, small abrasion on lateral aspect of left foot, minimal edema r Left leg  Left lateral foot ( site of foot corn removal)     Discharge Diagnoses:  Active Problems:   Cellulitis   Hyponatremia   GERD (gastroesophageal reflux disease)   Type 2  diabetes mellitus with hyperlipidemia (HCC)   BPH (benign prostatic hyperplasia)    Discharge Instructions  Discharge Instructions    Diet - low sodium heart healthy   Complete by: As directed    Diet - low sodium heart healthy   Complete by: As directed    Increase activity slowly   Complete by: As directed    Increase activity slowly   Complete by: As directed       Allergies as of 07/09/2019      Reactions   Metformin And Related       Medication List    STOP taking these medications   cephALEXin 500 MG capsule Commonly known as: KEFLEX     TAKE these medications   Armodafinil 250 MG tablet Take 0.5 tablets (125 mg total) by mouth 2 (two) times daily.   ARTIFICIAL TEAR OP Place 1 drop into both eyes 4 (four) times daily as needed (dry eyes).   aspirin 81 MG tablet Take 81 mg by mouth daily.   brimonidine 0.2 % ophthalmic solution Commonly known as: ALPHAGAN Place 1 drop into both eyes 3 (three) times daily.   cefdinir 300 MG capsule Commonly known as: OMNICEF Take 1 capsule (300 mg total) by mouth 2 (two) times daily for 7 days.   dorzolamide-timolol 22.3-6.8 MG/ML ophthalmic solution Commonly known as: COSOPT Place 1 drop into the right eye 2 (two) times daily.   escitalopram 10 MG tablet Commonly known as: LEXAPRO Take 10 mg by mouth daily.   gabapentin 100 MG capsule Commonly known as: NEURONTIN Take 100 mg by mouth 2 (two) times daily.   hydrochlorothiazide 25 MG tablet Commonly known as: HYDRODIURIL Take 25 mg by mouth daily.   loratadine 10 MG tablet Commonly known as: CLARITIN Take 10 mg by mouth daily as needed for allergies.   meloxicam 7.5 MG tablet Commonly known as: MOBIC Take 7.5 mg by mouth daily.   metFORMIN 1000 MG tablet Commonly known as: GLUCOPHAGE Take 1,000 mg by mouth 2 (two) times daily with a meal.   omeprazole 20 MG capsule Commonly known as: PRILOSEC Take 40 mg by mouth daily.   Rocklatan 0.02-0.005 % Soln Generic drug: Netarsudil-Latanoprost Place 1 drop into both eyes at bedtime.   rosuvastatin 20 MG tablet Commonly known as: CRESTOR Take 20 mg by mouth daily.   sitaGLIPtin 100 MG tablet Commonly known as: JANUVIA Take 100 mg by mouth daily.   tamsulosin 0.4 MG Caps capsule Commonly known as: FLOMAX Take 0.4 mg by mouth daily.   Vitamin D2 10 MCG (400 UNIT) Tabs  2 tablets a day       Allergies  Allergen Reactions  . Metformin And Related         The results of significant diagnostics from this hospitalization (including imaging, microbiology, ancillary and laboratory) are listed below for reference.     Microbiology: Recent Results (from the past 240 hour(s))  Blood culture (routine x 2)     Status: None (Preliminary result)   Collection Time: 07/06/19 12:15 PM   Specimen: BLOOD  Result Value Ref Range Status   Specimen Description   Final    BLOOD SITE NOT SPECIFIED Performed at [facility], [address], [place], [place] [address]    Special Requests   Final    BOTTLES DRAWN AEROBIC AND ANAEROBIC Blood Culture adequate volume Performed at [facility] [address] [place], [place] [address]    Culture   Final  NO GROWTH 3 DAYS Performed at [facility], [place] [address] [place], [place] [address]    Report Status PENDING  Incomplete  Blood culture (routine x 2)     Status: None (Preliminary result)   Collection Time: 07/06/19 12:16 PM   Specimen: BLOOD RIGHT FOREARM  Result Value Ref Range Status   Specimen Description   Final    BLOOD RIGHT FOREARM Performed at [facility] [address] [place], [place] [address]    Special Requests   Final    BOTTLES DRAWN AEROBIC AND ANAEROBIC Blood Culture adequate volume Performed at [facility] [address] [place], [place] [address]    Culture   Final    NO GROWTH 3 DAYS Performed at [facility], [place] [address] [place], [place] [address]    Report Status PENDING  Incomplete  SARS CORONAVIRUS 2 (TAT 6-24 HRS) Nasopharyngeal Nasopharyngeal Swab     Status: None   Collection Time: 07/06/19  1:00 PM   Specimen: Nasopharyngeal Swab  Result Value Ref Range Status   SARS Coronavirus 2 NEGATIVE NEGATIVE Final    Comment: (NOTE) SARS-CoV-2 target nucleic acids are NOT DETECTED. The  SARS-CoV-2 RNA is generally detectable in upper and lower respiratory specimens during the acute phase of infection. Negative results do not preclude SARS-CoV-2 infection, do not rule out co-infections with other pathogens, and should not be used as the sole basis for treatment or other patient management decisions. Negative results must be combined with clinical observations, patient history, and epidemiological information. The expected result is Negative. Fact Sheet for Patients: SugarRoll.be Fact Sheet for Healthcare Providers: https://www.woods-mathews.com/ This test is not yet approved or cleared by the [place] FDA and  has been authorized for detection and/or diagnosis of SARS-CoV-2 by FDA under an Emergency Use Authorization (EUA). This EUA will remain  in effect (meaning this test can be used) for the duration of the COVID-19 declaration under Section 56 4(b)(1) of the Act, 21 U.S.C. section 360bbb-3(b)(1), unless the authorization is terminated or revoked sooner. Performed at [facility], [place] [address], [place], [place] [address]   MRSA PCR Screening     Status: None   Collection Time: 07/07/19  6:51 AM   Specimen: Nasal Mucosa; Nasopharyngeal  Result Value Ref Range Status   MRSA by PCR NEGATIVE NEGATIVE Final    Comment:        The GeneXpert MRSA Assay (FDA approved for NASAL specimens only), is one component of a comprehensive MRSA colonization surveillance program. It is not intended to diagnose MRSA infection nor to guide or monitor treatment for MRSA infections. Performed at [facility], [place] [address] [place] [address] [place], [place] [address]      Labs: BNP (last 3 results) No results for input(s): BNP in the last 8760 hours. Basic Metabolic Panel: Recent Labs  Lab 07/07/19 0401 07/07/19 0955 07/07/19 1446 07/08/19 0330 07/09/19 0306  NA 132* 131* 132* 132* 131*  K 4.0 3.9 3.9 3.9 4.0   CL 93* 93* 94* 96* 99  CO2 29 29 29 26 22   GLUCOSE 128* 167* 146* 106* 151*  BUN 9 9 10 12 16   CREATININE 0.49* 0.48* 0.46* 0.54* 0.50*  CALCIUM 8.9 8.9 8.8* 8.7* 8.4*   Liver Function Tests: No results for input(s): AST, ALT, ALKPHOS, BILITOT, PROT, ALBUMIN in the last 168 hours. No results for input(s): LIPASE, AMYLASE in the last 168 hours. No results for input(s): AMMONIA  in the last 168 hours. CBC: Recent Labs  Lab 07/06/19 1215 07/07/19 0401 07/08/19 0330 07/09/19 0306  WBC 9.6 9.2 9.3 8.7  NEUTROABS 7.1  --  6.3  --   HGB 12.0* 12.7* 13.5 12.7*  HCT 38.4* 40.9 43.7 40.6  MCV 88.1 88.1 88.3 88.6  PLT 212 231 241 226   Cardiac Enzymes: Recent Labs  Lab 07/06/19 1813  CKTOTAL 33*   BNP: Invalid input(s): POCBNP CBG: Recent Labs  Lab 07/08/19 1130 07/08/19 1645 07/08/19 2225 07/09/19 0719 07/09/19 1122  GLUCAP 186* 119* 177* 124* 204*   D-Dimer No results for input(s): DDIMER in the last 72 hours. Hgb A1c No results for input(s): HGBA1C in the last 72 hours. Lipid Profile No results for input(s): CHOL, HDL, LDLCALC, TRIG, CHOLHDL, LDLDIRECT in the last 72 hours. Thyroid function studies No results for input(s): TSH, T4TOTAL, T3FREE, THYROIDAB in the last 72 hours.  Invalid input(s): FREET3 Anemia work up No results for input(s): VITAMINB12, FOLATE, FERRITIN, TIBC, IRON, RETICCTPCT in the last 72 hours. Urinalysis    Component Value Date/Time   COLORURINE YELLOW 07/08/2019 [place] 07/08/2019 1247   LABSPEC 1.020 07/08/2019 1247   PHURINE 6.0 07/08/2019 1247   GLUCOSEU NEGATIVE 07/08/2019 1247   HGBUR NEGATIVE 07/08/2019 [place] 07/08/2019 1247   KETONESUR 5 (A) 07/08/2019 1247   PROTEINUR NEGATIVE 07/08/2019 1247   NITRITE NEGATIVE 07/08/2019 1247   LEUKOCYTESUR TRACE (A) 07/08/2019 1247   Sepsis Labs Invalid input(s): PROCALCITONIN,  WBC,  LACTICIDVEN Microbiology Recent Results (from the past 240  hour(s))  Blood culture (routine x 2)     Status: None (Preliminary result)   Collection Time: 07/06/19 12:15 PM   Specimen: BLOOD  Result Value Ref Range Status   Specimen Description   Final    BLOOD SITE NOT SPECIFIED Performed at [facility], [place] [address] [place], [place] [address]    Special Requests   Final    BOTTLES DRAWN AEROBIC AND ANAEROBIC Blood Culture adequate volume Performed at [facility] [address], [place], [place] [address]    Culture   Final    NO GROWTH 3 DAYS Performed at [facility], [place] [address], [place], [place] [address]    Report Status PENDING  Incomplete  Blood culture (routine x 2)     Status: None (Preliminary result)   Collection Time: 07/06/19 12:16 PM   Specimen: BLOOD RIGHT FOREARM  Result Value Ref Range Status   Specimen Description   Final    BLOOD RIGHT FOREARM Performed at [facility] [address] [place], [place] [address]    Special Requests   Final    BOTTLES DRAWN AEROBIC AND ANAEROBIC Blood Culture adequate volume Performed at [facility] [address], [place], [place] [address]    Culture   Final    NO GROWTH 3 DAYS Performed at [facility], [place] [address], [place],  [address]    Report Status PENDING  Incomplete  SARS CORONAVIRUS 2 (TAT 6-24 HRS) Nasopharyngeal Nasopharyngeal Swab     Status: None   Collection Time: 07/06/19  1:00 PM   Specimen: Nasopharyngeal Swab  Result Value Ref Range Status   SARS Coronavirus 2 NEGATIVE NEGATIVE Final    Comment: (NOTE) SARS-CoV-2 target nucleic acids are NOT DETECTED. The SARS-CoV-2 RNA is generally detectable in upper and lower respiratory specimens during the acute phase of infection.  Negative results do not preclude SARS-CoV-2 infection, do not rule out co-infections with other pathogens, and should not be used as the sole basis for treatment or other patient management  decisions. Negative results must be combined with clinical observations, patient history, and epidemiological information. The expected result is Negative. Fact Sheet for Patients: SugarRoll.be Fact Sheet for Healthcare Providers: https://www.woods-mathews.com/ This test is not yet approved or cleared by the [place] FDA and  has been authorized for detection and/or diagnosis of SARS-CoV-2 by FDA under an Emergency Use Authorization (EUA). This EUA will remain  in effect (meaning this test can be used) for the duration of the COVID-19 declaration under Section 56 4(b)(1) of the Act, 21 U.S.C. section 360bbb-3(b)(1), unless the authorization is terminated or revoked sooner. Performed at [facility], [place] [address] [place], [place] [address]   MRSA PCR Screening     Status: None   Collection Time: 07/07/19  6:51 AM   Specimen: Nasal Mucosa; Nasopharyngeal  Result Value Ref Range Status   MRSA by PCR NEGATIVE NEGATIVE Final    Comment:        The GeneXpert MRSA Assay (FDA approved for NASAL specimens only), is one component of a comprehensive MRSA colonization surveillance program. It is not intended to diagnose MRSA infection nor to guide or monitor treatment for MRSA infections. Performed at [facility], [place] [address], [place], [place] [address]      Time coordinating discharge: Over 30 minutes  SIGNED:   Desiree Hane, MD  [facility] 07/09/2019, 9:13 PM Pager   If 7PM-7AM, please contact night-coverage [email or web address] Password [email or web address]

## 2019-07-11 LAB — CULTURE, BLOOD (ROUTINE X 2)
Culture: NO GROWTH
Culture: NO GROWTH
Special Requests: ADEQUATE
Special Requests: ADEQUATE

## 2019-07-13 ENCOUNTER — Other Ambulatory Visit: Payer: Self-pay

## 2019-07-13 DIAGNOSIS — Z20822 Contact with and (suspected) exposure to covid-19: Secondary | ICD-10-CM

## 2019-07-14 LAB — NOVEL CORONAVIRUS, NAA: SARS-CoV-2, NAA: NOT DETECTED

## 2019-07-21 ENCOUNTER — Other Ambulatory Visit: Payer: Self-pay

## 2019-07-21 ENCOUNTER — Inpatient Hospital Stay (HOSPITAL_COMMUNITY)
Admission: EM | Admit: 2019-07-21 | Discharge: 2019-07-28 | DRG: 617 | Disposition: A | Discharge status: Skilled Nursing Facility | Payer: Medicare Other | Attending: Internal Medicine | Admitting: Internal Medicine

## 2019-07-21 ENCOUNTER — Encounter (HOSPITAL_COMMUNITY): Payer: Self-pay | Admitting: Emergency Medicine

## 2019-07-21 ENCOUNTER — Emergency Department (HOSPITAL_COMMUNITY): Payer: Medicare Other

## 2019-07-21 DIAGNOSIS — L03119 Cellulitis of unspecified part of limb: Secondary | ICD-10-CM | POA: Diagnosis not present

## 2019-07-21 DIAGNOSIS — Z825 Family history of asthma and other chronic lower respiratory diseases: Secondary | ICD-10-CM

## 2019-07-21 DIAGNOSIS — L03031 Cellulitis of right toe: Secondary | ICD-10-CM | POA: Diagnosis not present

## 2019-07-21 DIAGNOSIS — E669 Obesity, unspecified: Secondary | ICD-10-CM | POA: Diagnosis present

## 2019-07-21 DIAGNOSIS — L97529 Non-pressure chronic ulcer of other part of left foot with unspecified severity: Secondary | ICD-10-CM | POA: Diagnosis present

## 2019-07-21 DIAGNOSIS — G8190 Hemiplegia, unspecified affecting unspecified side: Secondary | ICD-10-CM | POA: Diagnosis present

## 2019-07-21 DIAGNOSIS — E872 Acidosis, unspecified: Secondary | ICD-10-CM | POA: Diagnosis present

## 2019-07-21 DIAGNOSIS — L02612 Cutaneous abscess of left foot: Secondary | ICD-10-CM | POA: Diagnosis not present

## 2019-07-21 DIAGNOSIS — Z791 Long term (current) use of non-steroidal anti-inflammatories (NSAID): Secondary | ICD-10-CM | POA: Diagnosis not present

## 2019-07-21 DIAGNOSIS — L0390 Cellulitis, unspecified: Secondary | ICD-10-CM | POA: Diagnosis not present

## 2019-07-21 DIAGNOSIS — E1140 Type 2 diabetes mellitus with diabetic neuropathy, unspecified: Secondary | ICD-10-CM | POA: Diagnosis present

## 2019-07-21 DIAGNOSIS — Z7984 Long term (current) use of oral hypoglycemic drugs: Secondary | ICD-10-CM | POA: Diagnosis not present

## 2019-07-21 DIAGNOSIS — L03116 Cellulitis of left lower limb: Secondary | ICD-10-CM | POA: Diagnosis present

## 2019-07-21 DIAGNOSIS — Z79899 Other long term (current) drug therapy: Secondary | ICD-10-CM

## 2019-07-21 DIAGNOSIS — E878 Other disorders of electrolyte and fluid balance, not elsewhere classified: Secondary | ICD-10-CM | POA: Diagnosis present

## 2019-07-21 DIAGNOSIS — Z20828 Contact with and (suspected) exposure to other viral communicable diseases: Secondary | ICD-10-CM | POA: Diagnosis present

## 2019-07-21 DIAGNOSIS — Z8631 Personal history of diabetic foot ulcer: Secondary | ICD-10-CM

## 2019-07-21 DIAGNOSIS — M86272 Subacute osteomyelitis, left ankle and foot: Secondary | ICD-10-CM | POA: Diagnosis present

## 2019-07-21 DIAGNOSIS — G4733 Obstructive sleep apnea (adult) (pediatric): Secondary | ICD-10-CM | POA: Diagnosis present

## 2019-07-21 DIAGNOSIS — L03032 Cellulitis of left toe: Secondary | ICD-10-CM | POA: Diagnosis not present

## 2019-07-21 DIAGNOSIS — Z6835 Body mass index (BMI) 35.0-35.9, adult: Secondary | ICD-10-CM | POA: Diagnosis not present

## 2019-07-21 DIAGNOSIS — E785 Hyperlipidemia, unspecified: Secondary | ICD-10-CM | POA: Diagnosis present

## 2019-07-21 DIAGNOSIS — E119 Type 2 diabetes mellitus without complications: Secondary | ICD-10-CM | POA: Diagnosis present

## 2019-07-21 DIAGNOSIS — E11621 Type 2 diabetes mellitus with foot ulcer: Secondary | ICD-10-CM | POA: Diagnosis present

## 2019-07-21 DIAGNOSIS — E7800 Pure hypercholesterolemia, unspecified: Secondary | ICD-10-CM | POA: Diagnosis present

## 2019-07-21 DIAGNOSIS — Q8589 Other phakomatoses, not elsewhere classified: Secondary | ICD-10-CM

## 2019-07-21 DIAGNOSIS — Q859 Phakomatosis, unspecified: Secondary | ICD-10-CM | POA: Diagnosis not present

## 2019-07-21 DIAGNOSIS — K219 Gastro-esophageal reflux disease without esophagitis: Secondary | ICD-10-CM | POA: Diagnosis present

## 2019-07-21 DIAGNOSIS — Z89422 Acquired absence of other left toe(s): Secondary | ICD-10-CM | POA: Diagnosis not present

## 2019-07-21 DIAGNOSIS — E1169 Type 2 diabetes mellitus with other specified complication: Principal | ICD-10-CM | POA: Diagnosis present

## 2019-07-21 DIAGNOSIS — Z85820 Personal history of malignant melanoma of skin: Secondary | ICD-10-CM | POA: Diagnosis not present

## 2019-07-21 DIAGNOSIS — G40909 Epilepsy, unspecified, not intractable, without status epilepticus: Secondary | ICD-10-CM | POA: Diagnosis present

## 2019-07-21 DIAGNOSIS — I10 Essential (primary) hypertension: Secondary | ICD-10-CM | POA: Diagnosis present

## 2019-07-21 DIAGNOSIS — Z7982 Long term (current) use of aspirin: Secondary | ICD-10-CM

## 2019-07-21 DIAGNOSIS — E871 Hypo-osmolality and hyponatremia: Secondary | ICD-10-CM | POA: Diagnosis present

## 2019-07-21 DIAGNOSIS — Q858 Other phakomatoses, not elsewhere classified: Secondary | ICD-10-CM | POA: Diagnosis not present

## 2019-07-21 DIAGNOSIS — Z872 Personal history of diseases of the skin and subcutaneous tissue: Secondary | ICD-10-CM | POA: Diagnosis not present

## 2019-07-21 DIAGNOSIS — N400 Enlarged prostate without lower urinary tract symptoms: Secondary | ICD-10-CM | POA: Diagnosis present

## 2019-07-21 DIAGNOSIS — M86172 Other acute osteomyelitis, left ankle and foot: Secondary | ICD-10-CM | POA: Diagnosis not present

## 2019-07-21 DIAGNOSIS — H409 Unspecified glaucoma: Secondary | ICD-10-CM | POA: Diagnosis present

## 2019-07-21 LAB — CBC WITH DIFFERENTIAL/PLATELET
Abs Immature Granulocytes: 0.03 10*3/uL (ref 0.00–0.07)
Basophils Absolute: 0 10*3/uL (ref 0.0–0.1)
Basophils Relative: 1 %
Eosinophils Absolute: 0.2 10*3/uL (ref 0.0–0.5)
Eosinophils Relative: 2 %
HCT: 42.2 % (ref 39.0–52.0)
Hemoglobin: 13.2 g/dL (ref 13.0–17.0)
Immature Granulocytes: 0 %
Lymphocytes Relative: 14 %
Lymphs Abs: 1.2 10*3/uL (ref 0.7–4.0)
MCH: 27.6 pg (ref 26.0–34.0)
MCHC: 31.3 g/dL (ref 30.0–36.0)
MCV: 88.3 fL (ref 80.0–100.0)
Monocytes Absolute: 0.8 10*3/uL (ref 0.1–1.0)
Monocytes Relative: 9 %
Neutro Abs: 6.6 10*3/uL (ref 1.7–7.7)
Neutrophils Relative %: 74 %
Platelets: 247 10*3/uL (ref 150–400)
RBC: 4.78 MIL/uL (ref 4.22–5.81)
RDW: 14.5 % (ref 11.5–15.5)
WBC: 8.8 10*3/uL (ref 4.0–10.5)
nRBC: 0 % (ref 0.0–0.2)

## 2019-07-21 LAB — GLUCOSE, CAPILLARY
Glucose-Capillary: 107 mg/dL — ABNORMAL HIGH (ref 70–99)
Glucose-Capillary: 139 mg/dL — ABNORMAL HIGH (ref 70–99)

## 2019-07-21 LAB — BASIC METABOLIC PANEL
Anion gap: 12 (ref 5–15)
BUN: 13 mg/dL (ref 8–23)
CO2: 27 mmol/L (ref 22–32)
Calcium: 9.1 mg/dL (ref 8.9–10.3)
Chloride: 92 mmol/L — ABNORMAL LOW (ref 98–111)
Creatinine, Ser: 0.54 mg/dL — ABNORMAL LOW (ref 0.61–1.24)
GFR calc Af Amer: >60 mL/min (ref >60)
GFR calc non Af Amer: >60 mL/min (ref >60)
Glucose, Bld: 154 mg/dL — ABNORMAL HIGH (ref 70–99)
Potassium: 4 mmol/L (ref 3.5–5.1)
Sodium: 131 mmol/L — ABNORMAL LOW (ref 135–145)

## 2019-07-21 LAB — LACTIC ACID, PLASMA
Lactic Acid, Venous: 2.5 mmol/L (ref 0.5–1.9)
Lactic Acid, Venous: 1.4 mmol/L (ref 0.5–1.9)

## 2019-07-21 LAB — C-REACTIVE PROTEIN: CRP: 2.3 mg/dL — ABNORMAL HIGH (ref <1.0)

## 2019-07-21 LAB — SEDIMENTATION RATE: Sed Rate: 28 mm/hr — ABNORMAL HIGH (ref 0–16)

## 2019-07-21 MED ORDER — BRIMONIDINE TARTRATE 0.2 % OP SOLN
1.0000 [drp] | Freq: Three times a day (TID) | OPHTHALMIC | Status: DC
  Administered 2019-07-21 – 2019-07-27 (×19): 1 [drp] via OPHTHALMIC
  Filled 2019-07-21 (×2): qty 5

## 2019-07-21 MED ORDER — ENOXAPARIN SODIUM 40 MG/0.4ML ~~LOC~~ SOLN
40.0000 mg | SUBCUTANEOUS | Status: DC
  Administered 2019-07-21 – 2019-07-27 (×7): 40 mg via SUBCUTANEOUS
  Filled 2019-07-21 (×7): qty 0.4

## 2019-07-21 MED ORDER — NETARSUDIL-LATANOPROST 0.02-0.005 % OP SOLN: 1.0000 [drp] | Freq: Every day | OPHTHALMIC | Status: DC

## 2019-07-21 MED ORDER — ACETAMINOPHEN 325 MG PO TABS
650.0000 mg | ORAL_TABLET | Freq: Four times a day (QID) | ORAL | Status: DC | PRN
  Administered 2019-07-23 – 2019-07-24 (×2): 650 mg via ORAL
  Filled 2019-07-21 (×2): qty 2

## 2019-07-21 MED ORDER — INSULIN ASPART 100 UNIT/ML ~~LOC~~ SOLN
0.0000 [IU] | Freq: Every day | SUBCUTANEOUS | Status: DC
  Administered 2019-07-27: 2 [IU] via SUBCUTANEOUS

## 2019-07-21 MED ORDER — SODIUM CHLORIDE 0.9 % IV BOLUS
1000.0000 mL | Freq: Once | INTRAVENOUS | Status: AC
  Administered 2019-07-21: 1000 mL via INTRAVENOUS

## 2019-07-21 MED ORDER — ACETAMINOPHEN 650 MG RE SUPP: 650.0000 mg | Freq: Four times a day (QID) | RECTAL | Status: DC | PRN

## 2019-07-21 MED ORDER — ALBUTEROL SULFATE (2.5 MG/3ML) 0.083% IN NEBU: 2.5000 mg | INHALATION_SOLUTION | Freq: Four times a day (QID) | RESPIRATORY_TRACT | Status: DC | PRN

## 2019-07-21 MED ORDER — INSULIN ASPART 100 UNIT/ML ~~LOC~~ SOLN
0.0000 [IU] | Freq: Three times a day (TID) | SUBCUTANEOUS | Status: DC
  Administered 2019-07-22: 1 [IU] via SUBCUTANEOUS
  Administered 2019-07-23: 2 [IU] via SUBCUTANEOUS
  Administered 2019-07-23: 3 [IU] via SUBCUTANEOUS
  Administered 2019-07-24: 2 [IU] via SUBCUTANEOUS
  Administered 2019-07-24: 3 [IU] via SUBCUTANEOUS
  Administered 2019-07-25 (×2): 2 [IU] via SUBCUTANEOUS
  Administered 2019-07-25 – 2019-07-26 (×4): 1 [IU] via SUBCUTANEOUS
  Administered 2019-07-27: 3 [IU] via SUBCUTANEOUS
  Administered 2019-07-27: 1 [IU] via SUBCUTANEOUS
  Administered 2019-07-27: 2 [IU] via SUBCUTANEOUS
  Administered 2019-07-28: 1 [IU] via SUBCUTANEOUS

## 2019-07-21 MED ORDER — CEFTRIAXONE SODIUM 1 G IJ SOLR: 1.0000 g | Freq: Once | INTRAMUSCULAR | Status: DC

## 2019-07-21 MED ORDER — DORZOLAMIDE HCL-TIMOLOL MAL 2-0.5 % OP SOLN
1.0000 [drp] | Freq: Two times a day (BID) | OPHTHALMIC | Status: DC
  Administered 2019-07-21 – 2019-07-27 (×13): 1 [drp] via OPHTHALMIC
  Filled 2019-07-21 (×2): qty 10

## 2019-07-21 MED ORDER — ONDANSETRON HCL 4 MG PO TABS: 4.0000 mg | ORAL_TABLET | Freq: Four times a day (QID) | ORAL | Status: DC | PRN

## 2019-07-21 MED ORDER — SODIUM CHLORIDE 0.9% FLUSH
3.0000 mL | Freq: Two times a day (BID) | INTRAVENOUS | Status: DC
  Administered 2019-07-21 – 2019-07-27 (×7): 3 mL via INTRAVENOUS

## 2019-07-21 MED ORDER — ASPIRIN EC 81 MG PO TBEC
81.0000 mg | DELAYED_RELEASE_TABLET | Freq: Every day | ORAL | Status: DC
  Administered 2019-07-21 – 2019-07-27 (×7): 81 mg via ORAL
  Filled 2019-07-21 (×8): qty 1

## 2019-07-21 MED ORDER — SODIUM CHLORIDE 0.9 % IV SOLN
1.0000 g | Freq: Once | INTRAVENOUS | Status: AC
  Administered 2019-07-21: 1 g via INTRAVENOUS
  Filled 2019-07-21: qty 10

## 2019-07-21 MED ORDER — GABAPENTIN 100 MG PO CAPS
100.0000 mg | ORAL_CAPSULE | Freq: Two times a day (BID) | ORAL | Status: DC
  Administered 2019-07-21 – 2019-07-27 (×13): 100 mg via ORAL
  Filled 2019-07-21 (×13): qty 1

## 2019-07-21 MED ORDER — NIVEA EX CREA
1.0000 "application " | TOPICAL_CREAM | CUTANEOUS | Status: DC | PRN
  Filled 2019-07-21: qty 120

## 2019-07-21 MED ORDER — PANTOPRAZOLE SODIUM 40 MG PO TBEC
40.0000 mg | DELAYED_RELEASE_TABLET | Freq: Every day | ORAL | Status: DC
  Administered 2019-07-22 – 2019-07-27 (×6): 40 mg via ORAL
  Filled 2019-07-21 (×7): qty 1

## 2019-07-21 MED ORDER — ROSUVASTATIN CALCIUM 20 MG PO TABS
20.0000 mg | ORAL_TABLET | Freq: Every day | ORAL | Status: DC
  Administered 2019-07-21 – 2019-07-27 (×7): 20 mg via ORAL
  Filled 2019-07-21 (×8): qty 1

## 2019-07-21 MED ORDER — ESCITALOPRAM OXALATE 10 MG PO TABS
10.0000 mg | ORAL_TABLET | Freq: Every day | ORAL | Status: DC
  Administered 2019-07-22 – 2019-07-27 (×6): 10 mg via ORAL
  Filled 2019-07-21 (×6): qty 1

## 2019-07-21 MED ORDER — ONDANSETRON HCL 4 MG/2ML IJ SOLN: 4.0000 mg | Freq: Four times a day (QID) | INTRAMUSCULAR | Status: DC | PRN

## 2019-07-21 MED ORDER — SODIUM CHLORIDE 0.9 % IV SOLN
1.0000 g | INTRAVENOUS | Status: DC
  Filled 2019-07-21: qty 10

## 2019-07-21 MED ORDER — TAMSULOSIN HCL 0.4 MG PO CAPS
0.4000 mg | ORAL_CAPSULE | Freq: Every day | ORAL | Status: DC
  Administered 2019-07-22 – 2019-07-27 (×6): 0.4 mg via ORAL
  Filled 2019-07-21 (×6): qty 1

## 2019-07-21 NOTE — ED Triage Notes (Signed)
Pt here from group home [facility] with C/O left foot and leg swelling.  Group home provider assessed pt and called pt's PCP who informed provider pt needs to be evaluated in ED. Redness and swelling noted to left foot.  EMS vitals:   BP 128/70 HR 76 RR 18 Sp02 98% RA  CBG 134

## 2019-07-21 NOTE — ED Provider Notes (Signed)
[facility] EMERGENCY DEPARTMENT Provider Note   CSN: [id number] Arrival date & time: 07/21/19  1133     History   Chief Complaint Chief Complaint  Patient presents with   Foot Pain   Leg Swelling    HPI Xavier Turner is a 63 y.o. male.     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  Patient most recently was discharged from the hospital on 07/09/2019 with cellulitis.  He had preceding injury to his left foot causing cellulitis, he was treated with antibiotics as an outpatient this did not respond to Keflex, he was brought into the hospital and started on ceftriaxone.  This did improve his symptoms he was discharged on cefdinir on the 15th.  He notes that he has been taking the antibiotics as directed, group home staff notes that 2 days ago was his last day of oral antibiotics.  Patient notes his symptoms had completely resolved but then rapidly returned yesterday.  He notes pain redness and swelling to the left foot spreading up to the tibial region.  He denies any fever nausea vomiting.   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[place])     Patient Active Problem List   Diagnosis Date Noted   Recurrent cellulitis of lower extremity 07/21/2019   Recurrent cellulitis 07/21/2019   Lactic acidosis 07/21/2019   Hyponatremia 07/08/2019   GERD (gastroesophageal reflux disease) 07/08/2019   Type 2 diabetes mellitus with hyperlipidemia ([place]) 07/08/2019   BPH (benign prostatic hyperplasia) 07/08/2019   Cellulitis 07/06/2019    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 Authorizing Provider  ARTIFICIAL TEAR OP Place 1 drop into both eyes 4 (four) times daily as needed (dry eyes).    Yes [provider]  aspirin 81 MG tablet Take 81 mg by mouth daily.     Yes [provider]  brimonidine (ALPHAGAN) 0.2 % ophthalmic solution Place 1 drop into both eyes 3 (three) times daily. 06/24/19  Yes [provider]  cholecalciferol (VITAMIN D3) 25 MCG (1000 UT) tablet Take 1,000 Units by mouth daily.   Yes [provider]  dorzolamide-timolol (COSOPT) 22.3-6.8 MG/ML ophthalmic solution Place 1 drop into the right eye 2 (two) times daily.     Yes [provider]  Emollient (EUCERIN SKIN CALMING) CREA Apply 1 application topically as needed (dry skin).   Yes [provider]  escitalopram (LEXAPRO) 10 MG tablet Take 10 mg by mouth daily.   Yes [provider]  gabapentin (NEURONTIN) 100 MG capsule Take  100 mg by mouth 2 (two) times daily.   Yes [provider]  hydrochlorothiazide (HYDRODIURIL) 25 MG tablet Take 25 mg by mouth daily.   Yes [provider]  meloxicam (MOBIC) 7.5 MG tablet Take 7.5 mg by mouth daily.   Yes [provider]  metFORMIN (GLUCOPHAGE) 1000 MG tablet Take 1,000 mg by mouth 2 (two) times daily with a meal.   Yes [provider]  omeprazole (PRILOSEC) 20 MG capsule Take 20 mg by mouth daily.    Yes [provider]  ROCKLATAN 0.02-0.005 % SOLN Place 1 drop into both eyes at bedtime. 06/12/19  Yes  [provider]  rosuvastatin (CRESTOR) 20 MG tablet Take 20 mg by mouth daily.   Yes [provider]  sitaGLIPtin (JANUVIA) 100 MG tablet Take 100 mg by mouth daily.   Yes [provider]  tamsulosin (FLOMAX) 0.4 MG CAPS Take 0.4 mg by mouth daily.   Yes [provider]  Armodafinil 250 MG tablet Take 0.5 tablets (125 mg total) by mouth 2 (two) times daily. Patient not taking: Reported on 06/29/2019 01/14/14   Dohmeier, Asencion Partridge, MD    Family History Family History  Problem Relation Age of Onset   Emphysema Father    Cancer Father    Asthma Mother     Social History Social History   Tobacco Use   Smoking status: Never Smoker   Smokeless tobacco: Never Used  Substance Use Topics   Alcohol use: Not on file   Drug use: Not on file     Allergies   Patient has no active allergies.  Review of Systems Review of Systems  All other systems reviewed and are negative.   Physical Exam Updated Vital Signs BP 135/69 (BP Location: Left Arm)    Pulse 67    Temp 97.8 F (36.6 C) (Oral)    Resp 16    Ht 5' 4.25" (1.632 m)    Wt 93.5 kg    SpO2 100%    BMI 35.11 kg/m   Physical Exam Vitals signs and nursing note reviewed.  Constitutional:      Appearance: He is well-developed.  HENT:     Head: Normocephalic and atraumatic.  Eyes:     General: No scleral icterus.       Right eye: No discharge.        Left eye: No discharge.     Conjunctiva/sclera: Conjunctivae normal.     Pupils: Pupils are equal, round, and reactive to light.  Neck:     Musculoskeletal: Normal range of motion.     Vascular: No JVD.     Trachea: No tracheal deviation.  Pulmonary:     Effort: Pulmonary effort is normal.     Breath sounds: No stridor.  Musculoskeletal:     Comments: Erythema noted to the left foot diffusely spreading up to the proximal tibial region, warmth to touch throughout, no purulence  Neurological:     Mental Status: He is alert and oriented to  person, place, and time.     Coordination: Coordination normal.  Psychiatric:        Behavior: Behavior normal.        Thought Content: Thought content normal.        Judgment: Judgment normal.            ED Treatments / Results  Labs (all labs ordered are listed, but only abnormal results are displayed) Labs Reviewed  BASIC METABOLIC PANEL - Abnormal; Notable  for the following components:      Result Value   Sodium 131 (*)    Chloride 92 (*)    Glucose, Bld 154 (*)    Creatinine, Ser 0.54 (*)    All other components within normal limits  LACTIC ACID, PLASMA - Abnormal; Notable for the following components:   Lactic Acid, Venous 2.5 (*)    All other components within normal limits  GLUCOSE, CAPILLARY - Abnormal; Notable for the following components:   Glucose-Capillary 107 (*)    All other components within normal limits  CULTURE, BLOOD (ROUTINE X 2)  CULTURE, BLOOD (ROUTINE X 2)  NOVEL CORONAVIRUS, NAA (HOSP ORDER, SEND-OUT TO REF LAB; TAT 18-24 HRS)  CBC WITH DIFFERENTIAL/PLATELET  LACTIC ACID, PLASMA  C-REACTIVE PROTEIN  SEDIMENTATION RATE  BASIC METABOLIC PANEL  CBC    EKG None  Radiology Dg Foot Complete Left  Result Date: 07/21/2019 CLINICAL DATA:  Infected wound, left foot and leg swelling EXAM: LEFT FOOT - COMPLETE 3+ VIEW COMPARISON:  None. FINDINGS: The osseous structures appear diffusely demineralized which may limit detection of small or nondisplaced fractures. Circumferential swelling of the foot. Focal ulceration noted along the lateral base of the fifth metatarsophalangeal joint with subcutaneous gas and few punctate radiodensities which appear confined to the skin surface. No subjacent osseous erosion or periostitis. Large osteophyte formations are noted at the bases of the third fourth and fifth proximal phalanges. Additional degenerative changes are noted throughout the mid and hindfoot. Large corticated os peroneum is noted IMPRESSION: 1. Focal  ulceration along the lateral base of the fifth proximal phalanx joint with subcutaneous gas and few punctate radiodensities which appear confined to the skin surface. No subjacent osseous erosion to suggest early radiographic signs of osteomyelitis however MRI would be more sensitive and specific for early features. 2. Circumferential swelling of the foot. 3. Degenerative changes as above. Electronically Signed   By: Lovena Le M.D.   On: 07/21/2019 14:39    Procedures Procedures (including critical care time)  Medications Ordered in ED Medications  aspirin EC tablet 81 mg (81 mg Oral Given 07/21/19 1733)  escitalopram (LEXAPRO) tablet 10 mg (has no administration in time range)  gabapentin (NEURONTIN) capsule 100 mg (has no administration in time range)  tamsulosin (FLOMAX) capsule 0.4 mg (has no administration in time range)  pantoprazole (PROTONIX) EC tablet 40 mg (has no administration in time range)  brimonidine (ALPHAGAN) 0.2 % ophthalmic solution 1 drop (has no administration in time range)  nivea cream 1 application (has no administration in time range)  Netarsudil-Latanoprost 0.02-0.005 % SOLN 1 drop (has no administration in time range)  dorzolamide-timolol (COSOPT) 22.3-6.8 MG/ML ophthalmic solution 1 drop (has no administration in time range)  enoxaparin (LOVENOX) injection 40 mg (40 mg Subcutaneous Given 07/21/19 1733)  sodium chloride flush (NS) 0.9 % injection 3 mL (has no administration in time range)  ondansetron (ZOFRAN) tablet 4 mg (has no administration in time range)    Or  ondansetron (ZOFRAN) injection 4 mg (has no administration in time range)  acetaminophen (TYLENOL) tablet 650 mg (has no administration in time range)    Or  acetaminophen (TYLENOL) suppository 650 mg (has no administration in time range)  albuterol (PROVENTIL) (2.5 MG/3ML) 0.083% nebulizer solution 2.5 mg (has no administration in time range)  cefTRIAXone (ROCEPHIN) 1 g in sodium chloride 0.9 %  100 mL IVPB (has no administration in time range)  rosuvastatin (CRESTOR) tablet 20 mg (20 mg Oral Given 07/21/19 1732)  insulin aspart (novoLOG) injection 0-9 Units (0 Units Subcutaneous Not Given 07/21/19 1729)  insulin aspart (novoLOG) injection 0-5 Units (has no administration in time range)  sodium chloride 0.9 % bolus 1,000 mL (0 mLs Intravenous Stopped 07/21/19 1547)  cefTRIAXone (ROCEPHIN) 1 g in sodium chloride 0.9 % 100 mL IVPB (0 g Intravenous Stopped 07/21/19 1619)     Initial Impression / Assessment and Plan / ED Course  I have reviewed the triage vital signs and the nursing notes.  Pertinent labs & imaging results that were available during my care of the patient were reviewed by me and considered in my medical decision making (see chart for details).          Assessment/Plan: 63 year old male presents today with cellulitis.  He reports that he had complete resolution of symptoms, but unsure how accurate this description is, he has either complete rapid progression of cellulitis or incomplete therapy either way given the extensive nature of his cellulitis I do recommend hospital admission for ongoing IV antibiotics.  Discussed case with hospitalist who agreed for admission.  Final Clinical Impressions(s) / ED Diagnoses   Final diagnoses:  Cellulitis of left lower extremity    ED Discharge Orders    None       Francee Gentile 07/21/19 1925    Maudie Flakes, MD 07/22/19 1221

## 2019-07-21 NOTE — H&P (Addendum)
History and Physical    PAL SHELL [id number] DOB: [birth year] DOA: 07/21/2019  Referring MD/NP/PA: Lenn Sink, PA-C PCP: Harden Mo, MD  Patient coming from: From group home   Chief Complaint: Swelling of the left foot  I have personally briefly reviewed patient's old medical records in [place]   HPI: Xavier Turner is a 63 y.o. male with medical history significant of diabetes mellitus type 2, hypertension, hyperlipidemia, OSA, and Sturge-Weber syndrome involving face.  He presents with complaints of redness of the left foot.  Patient had initially been seen in the emergency department on 10/6 with redness of his left leg.  He underwent Doppler ultrasound which was negative for DVT and was discharged home on Keflex.  He returned to the hospital on 10/12 and was admitted for cellulitis of the left leg failing outpatient therapy.  He was initially started on Rocephin and switched to cefdinir to complete a 7-day course following discharge home on 10/15.  He was discharged back to the group home and reported taking medication as prescribed with improvement in symptoms.  In talks with his sister over the phone who had seen his foot last week reports hat it looked a lot better at that time.  The patient stated that his foot has been wrapped, but sister noted that his foot was not wrapped and she saw it last week.  He completed antibiotics 2 days ago, and reports that since that time redness and swelling returned, but now in his foot.  Notes associated symptoms of clearish drainage from the wound of the aspect of his foot.  Denies having any significant fever, chills, nausea, vomiting, or diarrhea.  Patient does have a podiatrist in the outpatient setting who clips his toenails.  ED Course: Upon admission into the emergency department had stable vitals.  CBC within normal limits.  Sodium 131, chloride 92, glucose 154, and lactic acid 2.5.  X-rays of the left foot revealed focal  ulceration of the with subcutaneous gas and a few punctate radiodensities in the skin confined to the skin with circumferential swelling of the foot, but no radiographic signs of osteomyelitis.  Patient was given 1 dose of Rocephin IV.  TRH called to admit.  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 He has never used smokeless tobacco. No history on file for alcohol and drug.  No Active Allergies  Family History  Problem Relation Age of Onset   Emphysema Father    Cancer Father    Asthma Mother     Prior to Admission medications   Medication Sig Start Date End Date Taking? Authorizing Provider  ARTIFICIAL TEAR OP Place 1 drop into both eyes 4 (four) times daily as needed (dry eyes).    Yes [provider]  aspirin  81 MG tablet Take 81 mg by mouth daily.     Yes [provider]  brimonidine (ALPHAGAN) 0.2 % ophthalmic solution Place 1 drop into both eyes 3 (three) times daily. 06/24/19  Yes [provider]  cholecalciferol (VITAMIN D3) 25 MCG (1000 UT) tablet Take 1,000 Units by mouth daily.   Yes [provider]  dorzolamide-timolol (COSOPT) 22.3-6.8 MG/ML ophthalmic solution Place 1 drop into the right eye 2 (two) times daily.     Yes [provider]  Emollient (EUCERIN SKIN CALMING) CREA Apply 1 application topically as needed (dry skin).   Yes [provider]  escitalopram  (LEXAPRO) 10 MG tablet Take 10 mg by mouth daily.   Yes [provider]  gabapentin (NEURONTIN) 100 MG capsule Take 100 mg by mouth 2 (two) times daily.   Yes [provider]  hydrochlorothiazide (HYDRODIURIL) 25 MG tablet Take 25 mg by mouth daily.   Yes [provider]  meloxicam (MOBIC) 7.5 MG tablet Take 7.5 mg by mouth daily.   Yes [provider]  metFORMIN (GLUCOPHAGE) 1000 MG tablet Take 1,000 mg by mouth 2 (two) times daily with a meal.   Yes [provider]  omeprazole (PRILOSEC) 20 MG capsule Take 20 mg by mouth daily.    Yes [provider]  ROCKLATAN 0.02-0.005 % SOLN Place 1 drop into both eyes at bedtime. 06/12/19  Yes [provider]  rosuvastatin (CRESTOR) 20 MG tablet Take 20 mg by mouth daily.   Yes [provider]  sitaGLIPtin (JANUVIA) 100 MG tablet Take 100 mg by mouth daily.   Yes [provider]  tamsulosin (FLOMAX) 0.4 MG CAPS Take 0.4 mg by mouth daily.   Yes [provider]  Armodafinil 250 MG tablet Take 0.5 tablets (125 mg total) by mouth 2 (two) times daily. Patient not taking: Reported on 06/29/2019 01/14/14   Dohmeier, Asencion Partridge, MD    Physical Exam:  Constitutional: Obese male who appears to be NAD, calm, comfortable Vitals:   07/21/19 1135 07/21/19 1136 07/21/19 1138  BP: 111/75  111/75  Pulse: 73 73 72  Resp:   16  Temp:   98.4 F (36.9 C)  TempSrc:   Oral  SpO2: 95% 93% 96%   Eyes: Left eye conjunctivo within normal limits and pupil reactive to light. ENMT: Mucous membranes are moist. Posterior pharynx clear of any exudate or lesions.  Neck: normal, supple, no masses, no thyromegaly Respiratory: clear to auscultation bilaterally, no wheezing, no crackles. Normal respiratory effort. No accessory muscle use.  Cardiovascular: Regular rate and rhythm, no murmurs / rubs / gallops.  1+ left lower extremity edema. 2+ pedal pulses. No carotid bruits.  Abdomen: no tenderness,  no masses palpated. No hepatosplenomegaly. Bowel sounds positive.  Musculoskeletal: no clubbing / cyanosis. No joint deformity upper and lower extremities. Good ROM, no contractures. Normal muscle tone.  Skin: Sturge-Weber syndrome of the right side of the face.  Increased warmth and erythema noted of the left foot to the ankle. Ulcer present of the lateral aspect of the left foot without drainage. Neurologic: CN 2-12 grossly intact. DTR normal. Strength 5/5 in all 4.  Psychiatric: Normal judgment and insight. Alert and oriented x 3. Normal mood.     Labs on Admission: I have personally reviewed following labs and imaging studies  CBC: Recent Labs  Lab 07/21/19 1210  WBC 8.8  NEUTROABS 6.6  HGB 13.2  HCT 42.2  MCV 88.3  PLT 247   Basic  Metabolic Panel: Recent Labs  Lab 07/21/19 1210  NA 131*  K 4.0  CL 92*  CO2 27  GLUCOSE 154*  BUN 13  CREATININE 0.54*  CALCIUM 9.1   GFR: CrCl cannot be calculated (Unknown ideal weight.). Liver Function Tests: No results for input(s): AST, ALT, ALKPHOS, BILITOT, PROT, ALBUMIN in the last 168 hours. No results for input(s): LIPASE, AMYLASE in the last 168 hours. No results for input(s): AMMONIA in the last 168 hours. Coagulation Profile: No results for input(s): INR, PROTIME in the last 168 hours. Cardiac Enzymes: No results for input(s): CKTOTAL, CKMB, CKMBINDEX, TROPONINI in the last 168 hours. BNP (last 3 results) No results for input(s): PROBNP in the last 8760 hours. HbA1C: No results for input(s): HGBA1C in the last 72 hours. CBG: No results for input(s): GLUCAP in the last 168 hours. Lipid Profile: No results for input(s): CHOL, HDL, LDLCALC, TRIG, CHOLHDL, LDLDIRECT in the last 72 hours. Thyroid Function Tests: No results for input(s): TSH, T4TOTAL, FREET4, T3FREE, THYROIDAB in the last 72 hours. Anemia Panel: No results for input(s): VITAMINB12, FOLATE, FERRITIN, TIBC, IRON, RETICCTPCT in the last 72 hours. Urine  analysis:    Component Value Date/Time   COLORURINE YELLOW 07/08/2019 [place] 07/08/2019 1247   LABSPEC 1.020 07/08/2019 1247   PHURINE 6.0 07/08/2019 1247   GLUCOSEU NEGATIVE 07/08/2019 1247   HGBUR NEGATIVE 07/08/2019 [place] 07/08/2019 1247   KETONESUR 5 (A) 07/08/2019 1247   PROTEINUR NEGATIVE 07/08/2019 1247   NITRITE NEGATIVE 07/08/2019 1247   LEUKOCYTESUR TRACE (A) 07/08/2019 1247   Sepsis Labs: Recent Results (from the past 240 hour(s))  Novel Coronavirus, NAA ([facility])     Status: None   Collection Time: 07/13/19 12:00 AM   Specimen: Nasopharyngeal(NP) swabs in vial transport medium   NASOPHARYNGE  TESTING  Result Value Ref Range Status   SARS-CoV-2, NAA Not Detected Not Detected Final    Comment: This nucleic acid amplification test was developed and its performance characteristics determined by [employer]. Nucleic acid amplification tests include PCR and TMA. This test has not been FDA cleared or approved. This test has been authorized by FDA under an Emergency Use Authorization (EUA). This test is only authorized for the duration of time the declaration that circumstances exist justifying the authorization of the emergency use of in vitro diagnostic tests for detection of SARS-CoV-2 virus and/or diagnosis of COVID-19 infection under section 564(b)(1) of the Act, 21 U.S.C. 664QIH-4(V) (1), unless the authorization is terminated or revoked sooner. When diagnostic testing is negative, the possibility of a false negative result should be considered in the context of a patient's recent exposures and the presence of clinical signs and symptoms consistent with COVID-19. An individual without symptoms of COVID-19 and who is not shedding SARS-CoV-2 virus would  expect to have a negative (not detected) result in this assay.      Radiological Exams on Admission: Dg Foot Complete Left  Result Date: 07/21/2019 CLINICAL  DATA:  Infected wound, left foot and leg swelling EXAM: LEFT FOOT - COMPLETE 3+ VIEW COMPARISON:  None. FINDINGS: The osseous structures appear diffusely demineralized which may limit detection of small or nondisplaced fractures. Circumferential swelling of the foot. Focal ulceration noted along the lateral base of the fifth metatarsophalangeal joint with subcutaneous gas and few punctate radiodensities which appear confined to the skin surface. No subjacent osseous erosion or periostitis. Large osteophyte formations are noted at the bases of the third fourth and fifth  proximal phalanges. Additional degenerative changes are noted throughout the mid and hindfoot. Large corticated os peroneum is noted IMPRESSION: 1. Focal ulceration along the lateral base of the fifth proximal phalanx joint with subcutaneous gas and few punctate radiodensities which appear confined to the skin surface. No subjacent osseous erosion to suggest early radiographic signs of osteomyelitis however MRI would be more sensitive and specific for early features. 2. Circumferential swelling of the foot. 3. Degenerative changes as above. Electronically Signed   By: Lovena Le M.D.   On: 07/21/2019 14:39      Assessment/Plan Cellulitis of Left foot, diabetic ulcer of left foot: Recurrent. Patient presents with redness and swelling of the left foot.  He was recently treated for cellulitis involving the left leg.  X-ray imaging of the left leg does not show any signs of pulmonary component to give concern for osteomyelitis.  Question if patient not having area wrapped: Diabetic ulcer of the lateral aspect of the foot risk for symptoms. -Admit to a MedSurg bed  -Follow-up blood culture -Check ESR and CRP -Continue empiric antibiotics of Rocephin  -Continue counseling on the need of patient having diabetic ulcer wrapped in wearing shoes at all times -Consider having patient follow-up with his podiatrist in the outpatient setting  Lactic  acidosis: Acute.  Initial lactic acid 2.5. -Continue to trend lactic acid  Diabetes mellitus type 2: Last hemoglobin A1c 6.6 on 07/06/2019.  Patient appears well controlled on Metformin and Januvia. -Hypoglycemic protocols -Hold metformin and Januvia -CBGs before every meal and at bedtime with with sensitive SSI  Hyponatremia: Acute on chronic.  Patient initial sodium 131 on admission with hypochloremia.  Likely secondary to patient being on diuretic.  Patient was given 1 L normal saline IV fluids -Recheck sodium level in a.m.  Essential hypertension  -Held hydrochlorothiazide due to hyponatremia -Consider restarting when medically appropriate   Hyperlipidemia -Continue Crestor  BPH -Continue Flomax  Intellectual delay: Patient currently resides in a group home.  Sturge-Weber syndrome: Stable. -Continue outpatient  Management  Gerd - continue PPI  DVT prophylaxis: Lovenox Code Status: Full Family Communication: Sister updated over the phone Disposition Plan: Possible discharge back to group home in a.m. Consults called: None Admission status: Observation  Norval Morton MD [facility] Pager [phone]   If 7PM-7AM, please contact night-coverage [email or web address] Password [email or web address]  07/21/2019, 3:29 PM

## 2019-07-21 NOTE — Discharge Planning (Signed)
Pt guardian is his sister Xavier Turner Sister   (cell)[phone]  ([id number]   Please notify pt brother, Xavier Turner when he gets a room [phone]

## 2019-07-21 NOTE — ED Notes (Signed)
Attempted to call report.  Unable to give at this time

## 2019-07-22 ENCOUNTER — Other Ambulatory Visit: Payer: Self-pay

## 2019-07-22 ENCOUNTER — Encounter (HOSPITAL_COMMUNITY): Payer: Self-pay

## 2019-07-22 DIAGNOSIS — Z872 Personal history of diseases of the skin and subcutaneous tissue: Secondary | ICD-10-CM | POA: Diagnosis not present

## 2019-07-22 DIAGNOSIS — Q859 Phakomatosis, unspecified: Secondary | ICD-10-CM | POA: Diagnosis not present

## 2019-07-22 DIAGNOSIS — L03031 Cellulitis of right toe: Secondary | ICD-10-CM | POA: Diagnosis not present

## 2019-07-22 DIAGNOSIS — K219 Gastro-esophageal reflux disease without esophagitis: Secondary | ICD-10-CM | POA: Diagnosis not present

## 2019-07-22 DIAGNOSIS — N400 Enlarged prostate without lower urinary tract symptoms: Secondary | ICD-10-CM | POA: Diagnosis not present

## 2019-07-22 DIAGNOSIS — E119 Type 2 diabetes mellitus without complications: Secondary | ICD-10-CM | POA: Diagnosis not present

## 2019-07-22 DIAGNOSIS — L03119 Cellulitis of unspecified part of limb: Secondary | ICD-10-CM | POA: Diagnosis not present

## 2019-07-22 DIAGNOSIS — E871 Hypo-osmolality and hyponatremia: Secondary | ICD-10-CM | POA: Diagnosis not present

## 2019-07-22 LAB — BASIC METABOLIC PANEL
Anion gap: 10 (ref 5–15)
BUN: 7 mg/dL — ABNORMAL LOW (ref 8–23)
CO2: 27 mmol/L (ref 22–32)
Calcium: 8.6 mg/dL — ABNORMAL LOW (ref 8.9–10.3)
Chloride: 94 mmol/L — ABNORMAL LOW (ref 98–111)
Creatinine, Ser: 0.42 mg/dL — ABNORMAL LOW (ref 0.61–1.24)
GFR calc Af Amer: >60 mL/min (ref >60)
GFR calc non Af Amer: >60 mL/min (ref >60)
Glucose, Bld: 106 mg/dL — ABNORMAL HIGH (ref 70–99)
Potassium: 3.5 mmol/L (ref 3.5–5.1)
Sodium: 131 mmol/L — ABNORMAL LOW (ref 135–145)

## 2019-07-22 LAB — GLUCOSE, CAPILLARY
Glucose-Capillary: 97 mg/dL (ref 70–99)
Glucose-Capillary: 129 mg/dL — ABNORMAL HIGH (ref 70–99)
Glucose-Capillary: 106 mg/dL — ABNORMAL HIGH (ref 70–99)
Glucose-Capillary: 160 mg/dL — ABNORMAL HIGH (ref 70–99)

## 2019-07-22 LAB — CBC
HCT: 39.6 % (ref 39.0–52.0)
Hemoglobin: 12.5 g/dL — ABNORMAL LOW (ref 13.0–17.0)
MCH: 27.1 pg (ref 26.0–34.0)
MCHC: 31.6 g/dL (ref 30.0–36.0)
MCV: 85.9 fL (ref 80.0–100.0)
Platelets: 215 10*3/uL (ref 150–400)
RBC: 4.61 MIL/uL (ref 4.22–5.81)
RDW: 14.3 % (ref 11.5–15.5)
WBC: 6.7 10*3/uL (ref 4.0–10.5)
nRBC: 0 % (ref 0.0–0.2)

## 2019-07-22 LAB — NOVEL CORONAVIRUS, NAA (HOSP ORDER, SEND-OUT TO REF LAB; TAT 18-24 HRS): SARS-CoV-2, NAA: NOT DETECTED

## 2019-07-22 MED ORDER — SODIUM CHLORIDE 0.9 % IV SOLN
3.0000 g | Freq: Four times a day (QID) | INTRAVENOUS | Status: DC
  Administered 2019-07-22 – 2019-07-28 (×24): 3 g via INTRAVENOUS
  Filled 2019-07-22 (×3): qty 3
  Filled 2019-07-22 (×2): qty 8
  Filled 2019-07-22 (×2): qty 3
  Filled 2019-07-22: qty 8
  Filled 2019-07-22 (×3): qty 3
  Filled 2019-07-22 (×2): qty 8
  Filled 2019-07-22 (×9): qty 3
  Filled 2019-07-22: qty 8
  Filled 2019-07-22 (×2): qty 3
  Filled 2019-07-22: qty 8
  Filled 2019-07-22: qty 3

## 2019-07-22 NOTE — Consult Note (Signed)
[facility]    Date of Admission:  07/21/2019     Total days of antibiotics 1   Day 1 ceftriaxone                Reason for Consult: recurrent cellulitis     Referring Provider: Ree Kida  Primary Care Provider: Harden Mo, MD    Assessment: Xavier Turner is a 63 y.o. male with recurrent cellulitis of the plantar aspect of 5th metatarsal head. He has significant tenderness and an eschar with thin expressible drainage. Radiographs do not indicate bone infection; would ask podiatry vs [facility] team to help with debriding the site and dressing recommendations. Will change to unasyn for anaerobic coverage in addition to MSSA/strep species and gram negative organisms.     Plan: 1. Change IV antibiotics to Augmentin 2. Please consult podiatry to see patient vs orthopedics for consideration of debridement 3. [facility] team for dressing recommendations    Principal Problem:   Recurrent cellulitis of lower extremity Active Problems:   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HCC)   Hyponatremia   GERD (gastroesophageal reflux disease)   Type 2 diabetes mellitus with hyperlipidemia (HCC)   BPH (benign prostatic hyperplasia)   Lactic acidosis   . aspirin EC  81 mg Oral Daily  . brimonidine  1 drop Both Eyes TID  . dorzolamide-timolol  1 drop Right Eye BID  . enoxaparin (LOVENOX) injection  40 mg Subcutaneous Q24H  . escitalopram  10 mg Oral Daily  . gabapentin  100 mg Oral BID  . insulin aspart  0-5 Units Subcutaneous QHS  . insulin aspart  0-9 Units Subcutaneous TID WC  . Netarsudil-Latanoprost  1 drop Both Eyes QHS  . pantoprazole  40 mg Oral Daily  . rosuvastatin  20 mg Oral Daily  . sodium chloride flush  3 mL Intravenous Q12H  . tamsulosin  0.4 mg Oral Daily    HPI: Xavier Turner is a 63 y.o. male here for the 3rd time for worsening erythema and pain involving the right foot at the 5th toe. This started when he sought care with podiatry to have a corn removed  from the foot. He states that it just never healed following that procedure (he is unclear as to the timeline when he had this done). Since that time he has had a significant increase in pain, swelling and erythema/warmth to the left forefoot.   Admitted 10/05 - started on ceftriaxone with improvement; sent home on Cephalexin QID.  Readmitted 10/12 - Ceftriaxone IV with improvement and D/C on cefdinir x 7 more days.  Readmitted 10/27 for the same and started on IV ceftriaxone.   He has not had any fevers or chills. He states he was taking all doses of his antibiotics faithfully without missed dose. He had no side effects to any IV or PO antibiotics.    Review of Systems: Review of Systems  Constitutional: Negative for chills and fever.  HENT: Negative for tinnitus.   Eyes: Negative for blurred vision and photophobia.  Respiratory: Negative for cough, sputum production and shortness of breath.   Cardiovascular: Negative for chest pain.  Gastrointestinal: Negative for diarrhea, nausea and vomiting.  Genitourinary: Negative for dysuria.  Skin: Negative for rash.  Neurological: Negative for weakness and headaches.  Psychiatric/Behavioral: The patient is not nervous/anxious.     Past Medical History:  Diagnosis Date  . Diabetes ([place])   . Glaucoma   . Hemiparesis ([place])   .  Hypercholesterolemia   . Hyperlipemia   . Melanoma ([place])   . Melanoma ([place])   . Obesity   . OSA (obstructive sleep apnea)   . Seizure ([place])   . Seizure disorder ([place])   . Sturge syndrome ([place])   . 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HCC)     Social History   Tobacco Use  . Smoking status: Never Smoker  . Smokeless tobacco: Never Used  Substance Use Topics  . Alcohol use: Not on file  . Drug use: Not on file    Family History  Problem Relation Age of Onset  . Emphysema Father   . Cancer Father   . Asthma Mother    No Active Allergies  OBJECTIVE: Blood pressure 105/67, pulse 74, temperature 97.6 F (36.4 C),  temperature source Oral, resp. rate 18, height 5' 4.25" (1.632 m), weight 93.5 kg, SpO2 96 %.   Physical Exam Vitals signs and nursing note reviewed.  Constitutional:      Appearance: Normal appearance.  HENT:     Mouth/Throat:     Mouth: Mucous membranes are moist.     Pharynx: Oropharynx is clear. No oropharyngeal exudate.  Cardiovascular:     Rate and Rhythm: Normal rate and regular rhythm.     Pulses: Normal pulses.     Heart sounds: No murmur.  Pulmonary:     Effort: Pulmonary effort is normal.     Breath sounds: Normal breath sounds.  Abdominal:     General: Bowel sounds are normal. There is no distension.     Palpations: Abdomen is soft.  Musculoskeletal: Normal range of motion.     Comments: Left foot with forefoot erythema, swelling. Eschar on the plantar aspect of 5th toe. +tenderness Some thin serous drainage without odor.   Skin:    General: Skin is warm and dry.     Capillary Refill: Capillary refill takes less than 2 seconds.  Neurological:     Mental Status: He is alert and oriented to person, place, and time.  Psychiatric:        Behavior: Behavior normal.     Lab Results Lab Results  Component Value Date   WBC 6.7 07/22/2019   HGB 12.5 (L) 07/22/2019   HCT 39.6 07/22/2019   MCV 85.9 07/22/2019   PLT 215 07/22/2019    Lab Results  Component Value Date   CREATININE 0.42 (L) 07/22/2019   BUN 7 (L) 07/22/2019   NA 131 (L) 07/22/2019   K 3.5 07/22/2019   CL 94 (L) 07/22/2019   CO2 27 07/22/2019   No results found for: ALT, AST, GGT, ALKPHOS, BILITOT   Microbiology: Recent Results (from the past 240 hour(s))  Novel Coronavirus, NAA ([facility])     Status: None   Collection Time: 07/13/19 12:00 AM   Specimen: Nasopharyngeal(NP) swabs in vial transport medium   NASOPHARYNGE  TESTING  Result Value Ref Range Status   SARS-CoV-2, NAA Not Detected Not Detected Final    Comment: This nucleic acid amplification test was developed and its performance  characteristics determined by [employer]. Nucleic acid amplification tests include PCR and TMA. This test has not been FDA cleared or approved. This test has been authorized by FDA under an Emergency Use Authorization (EUA). This test is only authorized for the duration of time the declaration that circumstances exist justifying the authorization of the emergency use of in vitro diagnostic tests for detection of SARS-CoV-2 virus and/or diagnosis of COVID-19 infection under section 564(b)(1) of the Act, 21 U.S.C.  360bbb-3(b) (1), unless the authorization is terminated or revoked sooner. When diagnostic testing is negative, the possibility of a false negative result should be considered in the context of a patient's recent exposures and the presence of clinical signs and symptoms consistent with COVID-19. An individual without symptoms of COVID-19 and who is not shedding SARS-CoV-2 virus would  expect to have a negative (not detected) result in this assay.   Blood culture (routine x 2)     Status: None (Preliminary result)   Collection Time: 07/21/19  1:50 PM   Specimen: BLOOD RIGHT WRIST  Result Value Ref Range Status   Specimen Description BLOOD RIGHT WRIST  Final   Special Requests   Final    BOTTLES DRAWN AEROBIC AND ANAEROBIC Blood Culture results may not be optimal due to an inadequate volume of blood received in culture bottles   Culture   Final    NO GROWTH < 24 HOURS Performed at [facility], [place]. [address], [place], [place] [address]    Report Status PENDING  Incomplete  Novel Coronavirus, NAA (Hosp order, Send-out to Ref Lab; TAT 18-24 hrs     Status: None   Collection Time: 07/21/19  2:06 PM   Specimen: Nasopharyngeal Swab; Respiratory  Result Value Ref Range Status   SARS-CoV-2, NAA NOT DETECTED NOT DETECTED Final    Comment: (NOTE) This nucleic acid amplification test was developed and its performance characteristics determined by [employer]. Nucleic acid amplification tests include PCR and TMA. This test has not been FDA cleared or approved. This test has been authorized by FDA under an Emergency Use Authorization (EUA). This test is only authorized for the duration of time the declaration that circumstances exist justifying the authorization of the emergency use of in vitro diagnostic tests for detection of SARS-CoV-2 virus and/or diagnosis of COVID-19 infection under section 564(b)(1) of the Act, 21 U.S.C. [id number]) (1), unless the authorization is terminated or revoked sooner. When diagnostic testing is negative, the possibility of a false negative result should be considered in the context of a patient's recent exposures and the presence of clinical signs and symptoms consistent with COVID-19. An individual without symptoms of COVID- 19 and who is not shedding SARS-CoV-2 vi rus would expect to have a negative (not detected) result in this assay. Performed At: [facility] [address] [place], [place] [id number] Rush Farmer MD [id number]    [place]  Final    Comment: Performed at [facility], [place] [address] [place], [place] [address]  Blood culture (routine x 2)     Status: None (Preliminary result)   Collection Time: 07/21/19  3:37 PM   Specimen: BLOOD RIGHT WRIST  Result Value Ref Range Status   Specimen Description BLOOD RIGHT WRIST  Final   Special Requests   Final    BOTTLES DRAWN AEROBIC ONLY Blood Culture results may not be optimal due to an inadequate volume of blood received in culture bottles   Culture   Final    NO GROWTH < 24 HOURS Performed at [facility], [place] [address], [place], [place] [address]    Report Status PENDING  Incomplete     Janene Madeira, MSN, NP-C [facility] [email or web address] Pager: [phone] Office: [phone] [place]:  [phone]

## 2019-07-22 NOTE — Progress Notes (Signed)
PROGRESS NOTE    Xavier Turner  CBU:[id number] DOB: [birth year] DOA: 07/21/2019 PCP: Xavier Mo, MD   Brief Narrative:  HPI On 07/20/2001 by Dr. Fuller Turner Xavier Turner is a 63 y.o. male with medical history significant of diabetes mellitus type 2, hypertension, hyperlipidemia, OSA, and Sturge-Weber syndrome involving face.  He presents with complaints of redness of the left foot.  Patient had initially been seen in the emergency department on 10/6 with redness of his left leg.  He underwent Doppler ultrasound which was negative for DVT and was discharged home on Keflex.  He returned to the hospital on 10/12 and was admitted for cellulitis of the left leg failing outpatient therapy.  He was initially started on Rocephin and switched to cefdinir to complete a 7-day course following discharge home on 10/15.  He was discharged back to the group home and reported taking medication as prescribed with improvement in symptoms.  In talks with his sister over the phone who had seen his foot last week reports hat it looked a lot better at that time.  The patient stated that his foot has been wrapped, but sister noted that his foot was not wrapped and she saw it last week.  He completed antibiotics 2 days ago, and reports that since that time redness and swelling returned, but now in his foot.  Notes associated symptoms of clearish drainage from the wound of the aspect of his foot.  Denies having any significant fever, chills, nausea, vomiting, or diarrhea.  Patient does have a podiatrist in the outpatient setting who clips his toenails. 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  Patient recently hospitalized and treated for left foot cellulitis and discharged with oral completion of antibiotics. -Left foot x-ray shows focal ulceration on the lateral base of the fifth proximal phalanx joint with subcutaneous gas and a few punctate radiodensities which  appear confined to the skin surface.  No osseous erosion to suggest osteomyelitis however MRI would be sensitive.  Circumferential swelling of the foot. -Question whether patient was having his foot to right at the group home given that he does have a diabetic ulcer of the lateral aspect of his left foot -Currently dressing in place -Will continue wound care and antibiotics with Rocephin -Will discuss with infectious disease -ESR and CRP both elevated -Blood cultures  Diabetes mellitus, type II -Hemoglobin A1c 6.6 on 07/06/2019 -Hold Januvia and Metformin -Continue insulin sliding scale with CBG monitoring   Hyponatremia -Appears to be acute on chronic -Sodium on admission 131 -Sodium earlier this month 126- appears to have a baseline of approximately 130 -Continue to monitor BMP  Essential hypertension -Hydrochlorothiazide held due to hyponatremia  Hyperlipidemia -Continue statin  BPH  -Continue Flomax  Intellectual delay -Patient resides at a group home  Sturge-Weber syndrome -Stable continue outpatient management  GERD -Continue PPI  DVT Prophylaxis  Lovenox  Code Status: Full  Family Communication: None at bedside  Disposition Turner: Admitted.  Given that this is his third admission this month for similar presentation, will continue hospitalization with IV antibiotics until noted improvement of cellulitis, edema and erythema.  Consultants None  Procedures  None  Antibiotics   Anti-infectives (From admission, onward)   Start     Dose/Rate Route Frequency Ordered Stop   07/22/19 1500  cefTRIAXone (ROCEPHIN) 1 g in sodium chloride 0.9 % 100 mL IVPB     1 g 200 mL/hr over 30 Minutes Intravenous  Every 24 hours 07/21/19 1550     07/21/19 1500  cefTRIAXone (ROCEPHIN) injection 1 g  Status:  Discontinued     1 g Intramuscular  Once 07/21/19 1458 07/21/19 1458   07/21/19 1500  cefTRIAXone (ROCEPHIN) 1 g in sodium chloride 0.9 % 100 mL IVPB     1 g 200 mL/hr over  30 Minutes Intravenous  Once 07/21/19 1458 07/21/19 1619      Subjective:   Xavier Turner seen and examined today.  Feels his left leg and foot are swollen.  States he had an ulcer on his left pinky toe.  Currently denies chest pain or shortness of breath, abdominal pain, headache or dizziness.  Objective:   Vitals:   07/21/19 1711 07/22/19 0017 07/22/19 0447 07/22/19 0847  BP: 135/69 108/68 112/68 (!) 112/58  Pulse: 67 69 73 74  Resp: 16 18 18    Temp: 97.8 F (36.6 C) 98.1 F (36.7 C) 98.1 F (36.7 C)   TempSrc: Oral Oral Oral   SpO2: 100% 96% 96%   Weight:      Height:        Intake/Output Summary (Last 24 hours) at 07/22/2019 7897 Last data filed at 07/22/2019 8478 Gross per 24 hour  Intake 1580 ml  Output 2000 ml  Net -420 ml   Filed Weights   07/21/19 1600  Weight: 93.5 kg    Exam  General: Well developed, well nourished, NAD, appears stated age  HEENT: NCAT, mucous membranes moist.  Sturge-Weber mark/syndrome on the right side of the face  Cardiovascular: S1 S2 auscultated, RRR, no murmur  Respiratory: Clear to auscultation bilaterally   Abdomen: Soft, nontender, nondistended, + bowel sounds  Extremities: warm dry without cyanosis clubbing or edema of RLE. LLE erythema, edema, ulcer present on lateral aspect of the left foot without drainage.  Neuro: AAOx3, history of intellectual delay, nonfocal  Psych: Pleasant, appropriate mood and affect   Data Reviewed: I have personally reviewed following labs and imaging studies  CBC: Recent Labs  Lab 07/21/19 1210 07/22/19 0505  WBC 8.8 6.7  NEUTROABS 6.6  --   HGB 13.2 12.5*  HCT 42.2 39.6  MCV 88.3 85.9  PLT 247 412   Basic Metabolic Panel: Recent Labs  Lab 07/21/19 1210 07/22/19 0505  NA 131* 131*  K 4.0 3.5  CL 92* 94*  CO2 27 27  GLUCOSE 154* 106*  BUN 13 7*  CREATININE 0.54* 0.42*  CALCIUM 9.1 8.6*   GFR: Estimated Creatinine Clearance: 96.7 mL/min (A) (by C-G formula based on SCr  of 0.42 mg/dL (L)). Liver Function Tests: No results for input(s): AST, ALT, ALKPHOS, BILITOT, PROT, ALBUMIN in the last 168 hours. No results for input(s): LIPASE, AMYLASE in the last 168 hours. No results for input(s): AMMONIA in the last 168 hours. Coagulation Profile: No results for input(s): INR, PROTIME in the last 168 hours. Cardiac Enzymes: No results for input(s): CKTOTAL, CKMB, CKMBINDEX, TROPONINI in the last 168 hours. BNP (last 3 results) No results for input(s): PROBNP in the last 8760 hours. HbA1C: No results for input(s): HGBA1C in the last 72 hours. CBG: Recent Labs  Lab 07/21/19 1725 07/21/19 2141 07/22/19 0539  GLUCAP 107* 139* 97   Lipid Profile: No results for input(s): CHOL, HDL, LDLCALC, TRIG, CHOLHDL, LDLDIRECT in the last 72 hours. Thyroid Function Tests: No results for input(s): TSH, T4TOTAL, FREET4, T3FREE, THYROIDAB in the last 72 hours. Anemia Panel: No results for input(s): VITAMINB12, FOLATE, FERRITIN, TIBC, IRON, RETICCTPCT in  the last 72 hours. Urine analysis:    Component Value Date/Time   COLORURINE YELLOW 07/08/2019 [place] 07/08/2019 1247   LABSPEC 1.020 07/08/2019 1247   PHURINE 6.0 07/08/2019 1247   GLUCOSEU NEGATIVE 07/08/2019 1247   HGBUR NEGATIVE 07/08/2019 1247   BILIRUBINUR NEGATIVE 07/08/2019 1247   KETONESUR 5 (A) 07/08/2019 1247   PROTEINUR NEGATIVE 07/08/2019 1247   NITRITE NEGATIVE 07/08/2019 1247   LEUKOCYTESUR TRACE (A) 07/08/2019 1247   Sepsis Labs: @LABRCNTIP (procalcitonin:4,lacticidven:4)  ) Recent Results (from the past 240 hour(s))  Novel Coronavirus, NAA ([facility])     Status: None   Collection Time: 07/13/19 12:00 AM   Specimen: Nasopharyngeal(NP) swabs in vial transport medium   NASOPHARYNGE  TESTING  Result Value Ref Range Status   SARS-CoV-2, NAA Not Detected Not Detected Final    Comment: This nucleic acid amplification test was developed and its performance characteristics determined  by [employer]. Nucleic acid amplification tests include PCR and TMA. This test has not been FDA cleared or approved. This test has been authorized by FDA under an Emergency Use Authorization (EUA). This test is only authorized for the duration of time the declaration that circumstances exist justifying the authorization of the emergency use of in vitro diagnostic tests for detection of SARS-CoV-2 virus and/or diagnosis of COVID-19 infection under section 564(b)(1) of the Act, 21 U.S.C. 239RVU-0(E) (1), unless the authorization is terminated or revoked sooner. When diagnostic testing is negative, the possibility of a false negative result should be considered in the context of a patient's recent exposures and the presence of clinical signs and symptoms consistent with COVID-19. An individual without symptoms of COVID-19 and who is not shedding SARS-CoV-2 virus would  expect to have a negative (not detected) result in this assay.       Radiology Studies: Dg Foot Complete Left  Result Date: 07/21/2019 CLINICAL DATA:  Infected wound, left foot and leg swelling EXAM: LEFT FOOT - COMPLETE 3+ VIEW COMPARISON:  None. FINDINGS: The osseous structures appear diffusely demineralized which may limit detection of small or nondisplaced fractures. Circumferential swelling of the foot. Focal ulceration noted along the lateral base of the fifth metatarsophalangeal joint with subcutaneous gas and few punctate radiodensities which appear confined to the skin surface. No subjacent osseous erosion or periostitis. Large osteophyte formations are noted at the bases of the third fourth and fifth proximal phalanges. Additional degenerative changes are noted throughout the mid and hindfoot. Large corticated os peroneum is noted IMPRESSION: 1. Focal ulceration along the lateral base of the fifth proximal phalanx joint with subcutaneous gas and few punctate radiodensities which appear confined to the skin  surface. No subjacent osseous erosion to suggest early radiographic signs of osteomyelitis however MRI would be more sensitive and specific for early features. 2. Circumferential swelling of the foot. 3. Degenerative changes as above. Electronically Signed   By: Lovena Le M.D.   On: 07/21/2019 14:39     Scheduled Meds:  aspirin EC  81 mg Oral Daily   brimonidine  1 drop Both Eyes TID   dorzolamide-timolol  1 drop Right Eye BID   enoxaparin (LOVENOX) injection  40 mg Subcutaneous Q24H   escitalopram  10 mg Oral Daily   gabapentin  100 mg Oral BID   insulin aspart  0-5 Units Subcutaneous QHS   insulin aspart  0-9 Units Subcutaneous TID WC   Netarsudil-Latanoprost  1 drop Both Eyes QHS   pantoprazole  40 mg Oral Daily   rosuvastatin  20 mg Oral Daily   sodium chloride flush  3 mL Intravenous Q12H   tamsulosin  0.4 mg Oral Daily   Continuous Infusions:  cefTRIAXone (ROCEPHIN)  IV       LOS: 1 day   Time Spent in minutes   30 minutes  Zekiah Coen D.O. on 07/22/2019 at 9:42 AM  Between 7am to 7pm - Please see pager noted on [email or web address]  After 7pm go to [email or web address]  And look for the night coverage person covering for me after hours  [facility] Office  [phone]

## 2019-07-22 NOTE — TOC Initial Note (Signed)
Transition of Care [facility]) - Initial/Assessment Note    Patient Details  Name: Xavier Turner MRN: [id number] Date of Birth: 1956/09/04  Transition of Care [facility]) CM/SW Contact:    Alexander Mt, [place] Phone Number: 07/22/2019, 12:31 PM  Clinical Narrative:                 CSW spoke with pt sister and legal guardian Lawana Chambers at [phone]. Pt documentation of court appointed legal guardian papers is located in the media tab of Epic. Pt sister confirms pt is from group home (address listed on facesheet) and the group home contact is Joylene Igo on the facesheet. Pt has lived there for an extended period of time. 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 They are happy with the care and supervision that pt gets at his home however they are concerned that they may have difficulty with managing his leg wound care and that pt has had a few recent admissions to [facility]. Pt sister states that she hopes that if rehab is an option to manage the wound and get therapies that he would be able to go short term before returning to his group home.   CSW will request PT/OT orders and will follow for skilled needs. CSW will also submit for PASRR in order to initiate process in case pt in need of level 2 review.   Continue to follow.   Expected Discharge Plan: Skilled Nursing Facility Barriers to Discharge: Continued Medical Work up   Patient Goals and CMS Choice Patient states their goals for this hospitalization and ongoing recovery are:: for him to have his wound and leg managed so he can stop coming back and forth from the hospital CMS Medicare.gov Compare Post Acute Care list provided to:: Patient Represenative (must comment)(pt sisters (legal guardians)) Choice offered to / list presented to : [facility] POA / Guardian, Sibling  Expected Discharge Plan and Services Expected Discharge Plan: [place] In-house Referral: Clinical Social Work Discharge Planning Services: CM Consult Post Acute Care Choice: [place] Living arrangements for the past 2 months: Group Home    Prior Living Arrangements/Services Living arrangements for the past 2 months: Group Home Lives with:: Facility Resident Patient language and need for interpreter reviewed:: Yes(no needs) Do you feel safe going back to the place where you live?: Yes      Need for Family Participation in Patient Care: Yes (Comment)(assistance with decision making; assistance with ADL/IADLs) Care giver support system in place?: Yes (comment)(group home staff; adult siblings) Current home services: DME, Home PT Criminal Activity/Legal Involvement Pertinent to Current Situation/Hospitalization: No - Comment as needed  Activities of Daily Living Home Assistive Devices/Equipment: Cane (specify quad or straight), CBG Meter, Brace (specify type), CPAP, Shower chair with back, Grab bars in shower, Wheelchair ADL Screening (condition at time of admission) Patient's cognitive ability adequate to safely complete daily activities?: Yes Is the patient deaf or have difficulty hearing?: No Does the patient have difficulty seeing, even when wearing glasses/contacts?: No Does the patient have difficulty concentrating, remembering, or making decisions?: No Patient able to express need for assistance with ADLs?: Yes Does the patient have difficulty dressing or bathing?: No Independently performs ADLs?: No Communication: Independent Dressing (OT): Needs assistance Grooming: Needs assistance Feeding: Independent Bathing: Needs assistance Toileting: Independent with device (comment) In/Out Bed: Needs assistance Walks in Home: Independent with device (comment) Does the patient have difficulty  walking or climbing stairs?: Yes Weakness of Legs: Both Weakness of Arms/Hands: Left  Permission Sought/Granted Permission sought to  share information with : Facility Sport and exercise psychologist, Family Supports, Guardian Permission granted to share information with : Yes, Release of Information Signed  Share Information with NAME: Lawana Chambers  Permission granted to share info w AGENCY: [place], [phone]  Permission granted to share info w Relationship: sister and legal guardian  Permission granted to share info w Contact Information: [phone]  Emotional Assessment Appearance:: Other (Comment Required(telephonic assessment with pt sister) Attitude/Demeanor/Rapport: (telephonic assessment with pt sister) Affect (typically observed): (telephonic assessment with pt sister) Orientation: : Oriented to Self, Oriented to Place, Oriented to  Time, Oriented to Situation(ID/D) Alcohol / Substance Use: Not Applicable Psych Involvement: Outpatient Provider  Admission diagnosis:  swelling Patient Active Problem List   Diagnosis Date Noted  . Recurrent cellulitis of lower extremity 07/21/2019  . Recurrent cellulitis 07/21/2019  . Lactic acidosis 07/21/2019  . Hyponatremia 07/08/2019  . GERD (gastroesophageal reflux disease) 07/08/2019  . Type 2 diabetes mellitus with hyperlipidemia ([place]) 07/08/2019  . BPH (benign prostatic hyperplasia) 07/08/2019  . Cellulitis 07/06/2019  . 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 HYPERCHOLESTEROLEMIA 10/05/2009  . OBSTRUCTIVE SLEEP APNEA 10/05/2009  . MELANOMA, HX OF 10/05/2009  . SEIZURE DISORDER, HX OF 10/05/2009   PCP:  Harden Mo, MD Pharmacy:   [facility], [place] [address] Phone: [phone] Fax: [place], [place] [address] Phone: [phone] Fax: [phone]     Social Determinants of Health (SDOH)  Interventions    Readmission Risk Interventions Readmission Risk Prevention Plan 07/22/2019  Post Dischage Appt Not Complete  Appt Comments further work up SNF vs return to group home  Medication Screening Complete  Transportation Screening Complete  Some recent data might be hidden

## 2019-07-23 ENCOUNTER — Inpatient Hospital Stay (HOSPITAL_COMMUNITY): Payer: Medicare Other

## 2019-07-23 ENCOUNTER — Encounter (HOSPITAL_COMMUNITY): Payer: Self-pay | Admitting: Radiology

## 2019-07-23 DIAGNOSIS — M86172 Other acute osteomyelitis, left ankle and foot: Secondary | ICD-10-CM

## 2019-07-23 DIAGNOSIS — L03116 Cellulitis of left lower limb: Secondary | ICD-10-CM

## 2019-07-23 DIAGNOSIS — Q859 Phakomatosis, unspecified: Secondary | ICD-10-CM | POA: Diagnosis not present

## 2019-07-23 DIAGNOSIS — L03032 Cellulitis of left toe: Secondary | ICD-10-CM

## 2019-07-23 DIAGNOSIS — N400 Enlarged prostate without lower urinary tract symptoms: Secondary | ICD-10-CM | POA: Diagnosis not present

## 2019-07-23 DIAGNOSIS — E871 Hypo-osmolality and hyponatremia: Secondary | ICD-10-CM | POA: Diagnosis not present

## 2019-07-23 DIAGNOSIS — L03119 Cellulitis of unspecified part of limb: Secondary | ICD-10-CM | POA: Diagnosis not present

## 2019-07-23 DIAGNOSIS — M86272 Subacute osteomyelitis, left ankle and foot: Secondary | ICD-10-CM

## 2019-07-23 DIAGNOSIS — K219 Gastro-esophageal reflux disease without esophagitis: Secondary | ICD-10-CM | POA: Diagnosis not present

## 2019-07-23 DIAGNOSIS — L0390 Cellulitis, unspecified: Secondary | ICD-10-CM | POA: Diagnosis not present

## 2019-07-23 LAB — BASIC METABOLIC PANEL
Anion gap: 9 (ref 5–15)
BUN: 8 mg/dL (ref 8–23)
CO2: 27 mmol/L (ref 22–32)
Calcium: 8 mg/dL — ABNORMAL LOW (ref 8.9–10.3)
Chloride: 98 mmol/L (ref 98–111)
Creatinine, Ser: 0.47 mg/dL — ABNORMAL LOW (ref 0.61–1.24)
GFR calc Af Amer: >60 mL/min (ref >60)
GFR calc non Af Amer: >60 mL/min (ref >60)
Glucose, Bld: 204 mg/dL — ABNORMAL HIGH (ref 70–99)
Potassium: 3.7 mmol/L (ref 3.5–5.1)
Sodium: 134 mmol/L — ABNORMAL LOW (ref 135–145)

## 2019-07-23 LAB — GLUCOSE, CAPILLARY
Glucose-Capillary: 99 mg/dL (ref 70–99)
Glucose-Capillary: 241 mg/dL — ABNORMAL HIGH (ref 70–99)
Glucose-Capillary: 163 mg/dL — ABNORMAL HIGH (ref 70–99)
Glucose-Capillary: 162 mg/dL — ABNORMAL HIGH (ref 70–99)

## 2019-07-23 MED ORDER — POVIDONE-IODINE 10 % EX SWAB: 2.0000 "application " | Freq: Once | CUTANEOUS | Status: DC

## 2019-07-23 MED ORDER — GADOBUTROL 1 MMOL/ML IV SOLN
10.0000 mL | Freq: Once | INTRAVENOUS | Status: AC | PRN
  Administered 2019-07-23: 10 mL via INTRAVENOUS

## 2019-07-23 MED ORDER — CHLORHEXIDINE GLUCONATE 4 % EX LIQD
60.0000 mL | Freq: Once | CUTANEOUS | Status: AC
  Administered 2019-07-23: 4 via TOPICAL
  Filled 2019-07-23: qty 60

## 2019-07-23 MED ORDER — ENSURE PRE-SURGERY PO LIQD
296.0000 mL | Freq: Once | ORAL | Status: AC
  Administered 2019-07-23: 296 mL via ORAL
  Filled 2019-07-23: qty 296

## 2019-07-23 MED ORDER — CEFAZOLIN SODIUM-DEXTROSE 2-4 GM/100ML-% IV SOLN: 2.0000 g | INTRAVENOUS | Status: DC

## 2019-07-23 NOTE — Progress Notes (Signed)
ABI's have been completed. Preliminary results can be found in CV Proc through chart review.   07/23/19 2:43 PM Xavier Turner RVT

## 2019-07-23 NOTE — Consult Note (Signed)
ORTHOPAEDIC CONSULTATION  REQUESTING PHYSICIAN: Cristal Ford, DO  Chief Complaint: Pain cellulitis left foot with chronic ulceration fifth metatarsal head.  HPI: Xavier Turner is a 63 y.o. male who presents with diabetic insensate neuropathy with a Wagner grade 3 ulcer beneath the fifth metatarsal head left foot patient is undergoing conservative therapy including antibiotics and most recently is just completed an MRI scan of the foot.  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[place])    Past Surgical History:  Procedure Laterality Date   COLONOSCOPY     CRANIOTOMY  1999   melanoma removal  2011   Social History   Socioeconomic History   Marital status: Single    Spouse name: Not on file   Number of children: Not on file   Years of education: Not on file   Highest education level: Not on file  Occupational History   Occupation: [employer]    Comment: resides in a group home([facility])  Social Needs   Emergency planning/management officer strain: Not on file   Food insecurity    Worry: Not on file    Inability: Not on file   Transportation needs    Medical: Not on file    Non-medical: Not on file  Tobacco Use   Smoking status: Never Smoker   Smokeless tobacco: Never Used  Substance and Sexual Activity   Alcohol use: Not on file   Drug use: Not on file   Sexual activity: Not on file  Lifestyle   Physical activity    Days per week: Not on file    Minutes per session: Not on file   Stress: Not on file  Relationships   Social connections    Talks on phone: Not on file    Gets together: Not on file    Attends religious service: Not on file    Active member of club or  organization: Not on file    Attends meetings of clubs or organizations: Not on file    Relationship status: Not on file  Other Topics Concern   Not on file  Social History Narrative   Not on file   Family History  Problem Relation Age of Onset   Emphysema Father    Cancer Father    Asthma Mother    - negative except otherwise stated in the family history section No Active Allergies Prior to Admission medications   Medication Sig Start Date End Date Taking? Authorizing Provider  ARTIFICIAL TEAR OP Place 1 drop into both eyes 4 (four) times daily as needed (dry eyes).    Yes [provider]  aspirin 81 MG tablet Take 81 mg by mouth daily.     Yes [provider]  brimonidine (ALPHAGAN) 0.2 % ophthalmic solution Place 1 drop into both eyes 3 (three) times daily. 06/24/19  Yes [provider]  cholecalciferol (VITAMIN D3) 25 MCG (1000 UT) tablet Take 1,000 Units by mouth daily.   Yes [provider]  dorzolamide-timolol (COSOPT) 22.3-6.8 MG/ML ophthalmic solution Place 1 drop into the right eye 2 (two) times daily.     Yes [provider]  Emollient (EUCERIN SKIN CALMING) CREA Apply 1 application topically as needed (dry skin).   Yes [provider]  escitalopram (LEXAPRO) 10 MG tablet Take 10 mg by mouth daily.   Yes [provider]  gabapentin (NEURONTIN) 100 MG capsule Take 100 mg by mouth 2 (two) times daily.   Yes [provider]  hydrochlorothiazide (HYDRODIURIL) 25 MG tablet Take 25 mg by mouth daily.   Yes [provider]  meloxicam (MOBIC) 7.5 MG tablet Take 7.5 mg by mouth daily.   Yes [provider]  metFORMIN (GLUCOPHAGE) 1000 MG tablet Take 1,000 mg by mouth 2 (two) times daily with a meal.   Yes [provider]  omeprazole (PRILOSEC) 20 MG capsule Take 20 mg by mouth daily.    Yes [provider]  ROCKLATAN 0.02-0.005 % SOLN Place 1 drop into both eyes at bedtime.  06/12/19  Yes [provider]  rosuvastatin (CRESTOR) 20 MG tablet Take 20 mg by mouth daily.   Yes [provider]  sitaGLIPtin (JANUVIA) 100 MG tablet Take 100 mg by mouth daily.   Yes [provider]  tamsulosin (FLOMAX) 0.4 MG CAPS Take 0.4 mg by mouth daily.   Yes [provider]  Armodafinil 250 MG tablet Take 0.5 tablets (125 mg total) by mouth 2 (two) times daily. Patient not taking: Reported on 06/29/2019 01/14/14   Dohmeier, Asencion Partridge, MD   Mr Foot Left W Wo Contrast  Result Date: 07/23/2019 CLINICAL DATA:  Osteomyelitis, foot swelling, diabetic EXAM: MRI OF THE LEFT FOREFOOT WITHOUT AND WITH CONTRAST TECHNIQUE: Multiplanar, multisequence MR imaging of the left forefoot was performed both before and after administration of intravenous contrast. CONTRAST:  65mL GADAVIST GADOBUTROL 1 MMOL/ML IV SOLN COMPARISON:  None. FINDINGS: Bones/Joint/Cartilage There is increased marrow signal seen throughout the fifth metatarsal head with subtle T1 hypointensity. There is a small joint effusion seen at the fifth MTP joint. No definite area of cortical destruction however is noted. 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 There is hallux valgus deformity of the forefoot. Ligaments The Lisfranc ligament and partially visualized collateral ligaments are intact. Muscles and Tendons There is mild fatty atrophy noted within the muscles surrounding the forefoot. There is also diffusely increased signal seen throughout the muscles, likely due to the denervation atrophy. The flexor and extensor tendons appear to be intact. The plantar fascia is intact. Soft tissues Diffuse dorsal soft tissue edema seen. There is a area of superficial ulceration seen overlying the lateral aspect of the fifth MTP joint measuring approximately a 2 cm in length. Small subcutaneous emphysema seen within this region. There is a focal fluid collection measuring approximately 2 cm seen  beneath this area which extends to the plantar surface of the fifth metatarsal head. IMPRESSION: 1. Area of superficial ulceration with subcutaneous emphysema and early abscess/sinus tract extending to the fifth metatarsal head. 2. There is findings likely consistent with acute osteomyelitis involving the fifth metatarsal head. 3. Small joint effusion at the fifth MTP joint. Electronically Signed   By: Prudencio Pair M.D.   On: 07/23/2019 14:07   Vas [place] Burnard Bunting With/wo Tbi  Result Date: 07/23/2019 LOWER EXTREMITY DOPPLER STUDY Indications: Ulceration. High Risk Factors: Diabetes.  Limitations: Today's exam was limited due to patient pain tolerance. Comparison Study: No prior studies. Performing Technologist: Carlos Levering Rvt  Examination Guidelines: A complete evaluation includes at minimum, Doppler waveform signals and systolic blood pressure reading at the level of bilateral brachial,  anterior tibial, and posterior tibial arteries, when vessel segments are accessible. Bilateral testing is considered an integral part of a complete examination. Photoelectric Plethysmograph (PPG) waveforms and toe systolic pressure readings are included as required and additional duplex testing as needed. Limited examinations for reoccurring indications may be performed as noted.  ABI Findings: +---------+------------------+-----+---------+--------+  Right     Rt Pressure (mmHg) Index Waveform  Comment   +---------+------------------+-----+---------+--------+  Brachial  118                      triphasic           +---------+------------------+-----+---------+--------+  PTA       130                1.10  triphasic           +---------+------------------+-----+---------+--------+  DP        135                1.14  triphasic           +---------+------------------+-----+---------+--------+  Great Toe 101                0.86                      +---------+------------------+-----+---------+--------+  +---------+------------------+-----+---------+-----------+  Left      Lt Pressure (mmHg) Index Waveform  Comment      +---------+------------------+-----+---------+-----------+  Brachial                                     Contracture  +---------+------------------+-----+---------+-----------+  PTA       131                1.11  triphasic              +---------+------------------+-----+---------+-----------+  DP        132                1.12  triphasic              +---------+------------------+-----+---------+-----------+  Great Toe 85                 0.72                         +---------+------------------+-----+---------+-----------+ +-------+-----------+-----------+------------+------------+  ABI/TBI Today's ABI Today's TBI Previous ABI Previous TBI  +-------+-----------+-----------+------------+------------+  Right   1.14        0.86                                   +-------+-----------+-----------+------------+------------+  Left    1.12        0.72                                   +-------+-----------+-----------+------------+------------+  Summary: Right: Resting right ankle-brachial index is within normal range. No evidence of significant right lower extremity arterial disease. The right toe-brachial index is normal. Left: Resting left ankle-brachial index is within normal range. No evidence of significant left lower extremity arterial disease. The left toe-brachial index is normal. The blood pressure cuff was placed on the proximal calf for patient comfort.  *See table(s) above for measurements and observations.  Electronically signed by Servando Snare MD on 07/23/2019 at 4:26:04 PM.    Final    - pertinent xrays, CT, MRI studies were reviewed and independently interpreted  Positive ROS: All other systems have been reviewed and were otherwise negative with the exception of those mentioned in the HPI and as above.  Physical Exam: General: Alert, no acute distress Psychiatric: Patient is  competent for consent with normal mood and affect Lymphatic: No axillary or cervical lymphadenopathy Cardiovascular: No pedal edema Respiratory: No cyanosis, no use of accessory musculature GI: No organomegaly, abdomen is soft and non-tender    Images:  @ENCIMAGES @  Labs:  Lab Results  Component Value Date   HGBA1C 6.6 (H) 07/06/2019   ESRSEDRATE 28 (H) 07/21/2019   ESRSEDRATE 54 (H) 07/06/2019   CRP 2.3 (H) 07/21/2019   CRP 0.8 07/07/2019   CRP <0.8 07/06/2019   REPTSTATUS PENDING 07/21/2019   CULT  07/21/2019    NO GROWTH 2 DAYS Performed at [facility], [place] [address] [place], [place] [address]     No results found for: ALBUMIN, PREALBUMIN, LABURIC  Neurologic: Patient does not have protective sensation bilateral lower extremities.   MUSCULOSKELETAL:   Skin: Examination patient has painful cellulitis involving the forefoot and midfoot on the left.  He has an ulcer beneath the fifth metatarsal head.  Patient has a good dorsalis pedis pulse.  Review of the MRI scan shows osteomyelitis of the fifth metatarsal head as well as an abscess surrounding the fifth metatarsal head and enter ulceration that goes down to bone.  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  Plan: Plan: Have discussed the best option is to proceed with 1/5 ray amputation.  Risks and benefits were discussed including risk of the wound not healing.  Patient states he understands wishes to proceed at this time.  We will plan for surgery tomorrow patient will need to be nonweightbearing for about 2 weeks.  Anticipate continue antibiotics for about 72 hours postoperatively due to the extensive cellulitis in the midfoot and forefoot.  Thank you for the consult and the opportunity to see Xavier Turner, [place] [phone] 6:22 PM

## 2019-07-23 NOTE — Plan of Care (Signed)
Skin integrity assessed every shift and as needed.

## 2019-07-23 NOTE — Progress Notes (Signed)
PROGRESS NOTE    Xavier Turner  QMV:[id number] DOB: 08/29/1956 DOA: 07/21/2019 PCP: Harden Mo, MD   Brief Narrative:  HPI On 07/20/2001 by Dr. Fuller Plan Xavier Turner is a 63 y.o. male with medical history significant of diabetes mellitus type 2, hypertension, hyperlipidemia, OSA, and Sturge-Weber syndrome involving face.  He presents with complaints of redness of the left foot.  Patient had initially been seen in the emergency department on 10/6 with redness of his left leg.  He underwent Doppler ultrasound which was negative for DVT and was discharged home on Keflex.  He returned to the hospital on 10/12 and was admitted for cellulitis of the left leg failing outpatient therapy.  He was initially started on Rocephin and switched to cefdinir to complete a 7-day course following discharge home on 10/15.  He was discharged back to the group home and reported taking medication as prescribed with improvement in symptoms.  In talks with his sister over the phone who had seen his foot last week reports hat it looked a lot better at that time.  The patient stated that his foot has been wrapped, but sister noted that his foot was not wrapped and she saw it last week.  He completed antibiotics 2 days ago, and reports that since that time redness and swelling returned, but now in his foot.  Notes associated symptoms of clearish drainage from the wound of the aspect of his foot.  Denies having any significant fever, chills, nausea, vomiting, or diarrhea.  Patient does have a podiatrist in the outpatient setting who clips his toenails.  Interim history Patient mated with recurrent left foot cellulitis.  ID consulted and appreciated.  Pending ABI.  Currently on IV antibiotics. Assessment & Plan   Left foot cellulitis/diabetic ulcer of the left foot -Recurrent issue.  Patient recently hospitalized and treated for left foot cellulitis and discharged with oral completion of antibiotics. -Left foot x-ray  shows focal ulceration on the lateral base of the fifth proximal phalanx joint with subcutaneous gas and a few punctate radiodensities which appear confined to the skin surface.  No osseous erosion to suggest osteomyelitis however MRI would be sensitive.  Circumferential swelling of the foot. -Question whether patient was having his foot to right at the group home given that he does have a diabetic ulcer of the lateral aspect of his left foot -ESR and CRP both elevated -Blood cultures show no growth to date -Erythema has improved mildly today -Infectious disease consulted and appreciated, transitioned patient to Unasyn from ceftriaxone for broader coverage.  Recommended podiatry for debridement and tissue culture.  May consider giving long-acting infusion of gram-positive coverage such as dalbavancin or oritavancinc prior to discharge. -Pending ABI although patient does have good pulses on examination -Patient follows with Dr. Barkley Bruns ([place]). He does not come to [facility], will discuss with orthopedics.   Diabetes mellitus, type II  -Hemoglobin A1c 6.6 on 07/06/2019 -Hold Januvia and Metformin -Continue insulin sliding scale with CBG monitoring   Hyponatremia -Appears to be acute on chronic -Sodium on admission 131 -Sodium earlier this month 126- appears to have a baseline of approximately 130 -Pending BMP today  Essential hypertension -Hydrochlorothiazide held due to hyponatremia -BP stable  Hyperlipidemia -Continue statin  BPH  -Continue Flomax  Intellectual delay -Patient resides at a group home  Sturge-Weber syndrome -Stable continue outpatient management  GERD -Continue PPI  DVT Prophylaxis  Lovenox  Code Status: Full  Family Communication: None at bedside  Disposition Plan: Admitted.  Disposition TBD.  Pending improvement of erythema and cellulitis, and further ID recommendations.  Consultants None  Procedures  None  Antibiotics     Anti-infectives (From admission, onward)   Start     Dose/Rate Route Frequency Ordered Stop   07/22/19 1500  cefTRIAXone (ROCEPHIN) 1 g in sodium chloride 0.9 % 100 mL IVPB  Status:  Discontinued     1 g 200 mL/hr over 30 Minutes Intravenous Every 24 hours 07/21/19 1550 07/22/19 1100   07/22/19 1130  Ampicillin-Sulbactam (UNASYN) 3 g in sodium chloride 0.9 % 100 mL IVPB     3 g 200 mL/hr over 30 Minutes Intravenous Every 6 hours 07/22/19 1100     07/21/19 1500  cefTRIAXone (ROCEPHIN) injection 1 g  Status:  Discontinued     1 g Intramuscular  Once 07/21/19 1458 07/21/19 1458   07/21/19 1500  cefTRIAXone (ROCEPHIN) 1 g in sodium chloride 0.9 % 100 mL IVPB     1 g 200 mL/hr over 30 Minutes Intravenous  Once 07/21/19 1458 07/21/19 1619      Subjective:   Xavier Turner seen and examined today.  Patient continues to complain of pain in his left leg and foot, and states it is very stabbing in nature.  States on a scale of 1-10 his pain is a 12.  Denies current chest pain, shortness of breath, abdominal pain, nausea or vomiting, dizziness or headache.  Objective:   Vitals:   07/22/19 0847 07/22/19 1212 07/22/19 1823 07/23/19 0139  BP: (!) 112/58 105/67 118/61 118/69  Pulse: 74 74 78 75  Resp:  18 16 18   Temp:  97.6 F (36.4 C) (!) 97.2 F (36.2 C) 98 F (36.7 C)  TempSrc:  Oral Oral Oral  SpO2:  96% 94% 95%  Weight:      Height:        Intake/Output Summary (Last 24 hours) at 07/23/2019 0858 Last data filed at 07/23/2019 0800 Gross per 24 hour  Intake 640 ml  Output 1580 ml  Net -940 ml   Filed Weights   07/21/19 1600  Weight: 93.5 kg   Exam  General: Well developed, well nourished, NAD, appears stated age  HEENT: NCAT, mucous membranes moist.  Sturge-Weber Mark on right side of the face  Cardiovascular: S1 S2 auscultated, RRR, no murmur  Respiratory: Clear to auscultation bilaterally with equal chest rise  Abdomen: Soft, nontender, nondistended, + bowel  sounds  Extremities: warm dry without cyanosis clubbing or edema of RLE.  LLE erythema and edema mildly improving.  Patient with small ulceration on the lateral aspect of the foot without drainage.  Neuro: AAOx3, history of intellectual delay however nonfocal  Psych: Pleasant, appropriate mood and affect  Data Reviewed: I have personally reviewed following labs and imaging studies  CBC: Recent Labs  Lab 07/21/19 1210 07/22/19 0505  WBC 8.8 6.7  NEUTROABS 6.6  --   HGB 13.2 12.5*  HCT 42.2 39.6  MCV 88.3 85.9  PLT 247 373   Basic Metabolic Panel: Recent Labs  Lab 07/21/19 1210 07/22/19 0505  NA 131* 131*  K 4.0 3.5  CL 92* 94*  CO2 27 27  GLUCOSE 154* 106*  BUN 13 7*  CREATININE 0.54* 0.42*  CALCIUM 9.1 8.6*   GFR: Estimated Creatinine Clearance: 96.7 mL/min (A) (by C-G formula based on SCr of 0.42 mg/dL (L)). Liver Function Tests: No results for input(s): AST, ALT, ALKPHOS, BILITOT, PROT, ALBUMIN in the last 168 hours. No  results for input(s): LIPASE, AMYLASE in the last 168 hours. No results for input(s): AMMONIA in the last 168 hours. Coagulation Profile: No results for input(s): INR, PROTIME in the last 168 hours. Cardiac Enzymes: No results for input(s): CKTOTAL, CKMB, CKMBINDEX, TROPONINI in the last 168 hours. BNP (last 3 results) No results for input(s): PROBNP in the last 8760 hours. HbA1C: No results for input(s): HGBA1C in the last 72 hours. CBG: Recent Labs  Lab 07/22/19 0539 07/22/19 1107 07/22/19 1619 07/22/19 2119 07/23/19 0539  GLUCAP 97 129* 106* 160* 99   Lipid Profile: No results for input(s): CHOL, HDL, LDLCALC, TRIG, CHOLHDL, LDLDIRECT in the last 72 hours. Thyroid Function Tests: No results for input(s): TSH, T4TOTAL, FREET4, T3FREE, THYROIDAB in the last 72 hours. Anemia Panel: No results for input(s): VITAMINB12, FOLATE, FERRITIN, TIBC, IRON, RETICCTPCT in the last 72 hours. Urine analysis:    Component Value Date/Time    COLORURINE YELLOW 07/08/2019 1247   APPEARANCEUR CLEAR 07/08/2019 1247   LABSPEC 1.020 07/08/2019 1247   PHURINE 6.0 07/08/2019 1247   GLUCOSEU NEGATIVE 07/08/2019 1247   HGBUR NEGATIVE 07/08/2019 1247   BILIRUBINUR NEGATIVE 07/08/2019 1247   KETONESUR 5 (A) 07/08/2019 1247   PROTEINUR NEGATIVE 07/08/2019 1247   NITRITE NEGATIVE 07/08/2019 1247   LEUKOCYTESUR TRACE (A) 07/08/2019 1247   Sepsis Labs: @LABRCNTIP (procalcitonin:4,lacticidven:4)  ) Recent Results (from the past 240 hour(s))  Blood culture (routine x 2)     Status: None (Preliminary result)   Collection Time: 07/21/19  1:50 PM   Specimen: BLOOD RIGHT WRIST  Result Value Ref Range Status   Specimen Description BLOOD RIGHT WRIST  Final   Special Requests   Final    BOTTLES DRAWN AEROBIC AND ANAEROBIC Blood Culture results may not be optimal due to an inadequate volume of blood received in culture bottles   Culture   Final    NO GROWTH 2 DAYS Performed at [facility], [place] [address], [place], [place] [address]    Report Status PENDING  Incomplete  Novel Coronavirus, NAA (Hosp order, Send-out to Ref Lab; TAT 18-24 hrs     Status: None   Collection Time: 07/21/19  2:06 PM   Specimen: Nasopharyngeal Swab; Respiratory  Result Value Ref Range Status   SARS-CoV-2, NAA NOT DETECTED NOT DETECTED Final    Comment: (NOTE) This nucleic acid amplification test was developed and its performance characteristics determined by [employer]. Nucleic acid amplification tests include PCR and TMA. This test has not been FDA cleared or approved. This test has been authorized by FDA under an Emergency Use Authorization (EUA). This test is only authorized for the duration of time the declaration that circumstances exist justifying the authorization of the emergency use of in vitro diagnostic tests for detection of SARS-CoV-2 virus and/or diagnosis of COVID-19 infection under section 564(b)(1) of the Act, 21 U.S.C.  191YNW-2(N) (1), unless the authorization is terminated or revoked sooner. When diagnostic testing is negative, the possibility of a false negative result should be considered in the context of a patient's recent exposures and the presence of clinical signs and symptoms consistent with COVID-19. An individual without symptoms of COVID- 19 and who is not shedding SARS-CoV-2 vi rus would expect to have a negative (not detected) result in this assay. Performed At: [facility] [address] [place], [place] [id number] Rush Farmer MD HQ:[id number]    [place]  Final    Comment: Performed at [facility], [place]  [address], [place], [place] [address]  Blood culture (routine x 2)     Status: None (Preliminary result)   Collection Time: 07/21/19  3:37 PM   Specimen: BLOOD RIGHT WRIST  Result Value Ref Range Status   Specimen Description BLOOD RIGHT WRIST  Final   Special Requests   Final    BOTTLES DRAWN AEROBIC ONLY Blood Culture results may not be optimal due to an inadequate volume of blood received in culture bottles   Culture   Final    NO GROWTH 2 DAYS Performed at [facility], [place] [address], [place], [place] [address]    Report Status PENDING  Incomplete      Radiology Studies: Dg Foot Complete Left  Result Date: 07/21/2019 CLINICAL DATA:  Infected wound, left foot and leg swelling EXAM: LEFT FOOT - COMPLETE 3+ VIEW COMPARISON:  None. FINDINGS: The osseous structures appear diffusely demineralized which may limit detection of small or nondisplaced fractures. Circumferential swelling of the foot. Focal ulceration noted along the lateral base of the fifth metatarsophalangeal joint with subcutaneous gas and few punctate radiodensities which appear confined to the skin surface. No subjacent osseous erosion or periostitis. Large osteophyte formations are noted at the bases of the third fourth and fifth proximal phalanges. Additional  degenerative changes are noted throughout the mid and hindfoot. Large corticated os peroneum is noted IMPRESSION: 1. Focal ulceration along the lateral base of the fifth proximal phalanx joint with subcutaneous gas and few punctate radiodensities which appear confined to the skin surface. No subjacent osseous erosion to suggest early radiographic signs of osteomyelitis however MRI would be more sensitive and specific for early features. 2. Circumferential swelling of the foot. 3. Degenerative changes as above. Electronically Signed   By: Lovena Le M.D.   On: 07/21/2019 14:39     Scheduled Meds:  aspirin EC  81 mg Oral Daily   brimonidine  1 drop Both Eyes TID   dorzolamide-timolol  1 drop Right Eye BID   enoxaparin (LOVENOX) injection  40 mg Subcutaneous Q24H   escitalopram  10 mg Oral Daily   gabapentin  100 mg Oral BID   insulin aspart  0-5 Units Subcutaneous QHS   insulin aspart  0-9 Units Subcutaneous TID WC   Netarsudil-Latanoprost  1 drop Both Eyes QHS   pantoprazole  40 mg Oral Daily   rosuvastatin  20 mg Oral Daily   sodium chloride flush  3 mL Intravenous Q12H   tamsulosin  0.4 mg Oral Daily   Continuous Infusions:  ampicillin-sulbactam (UNASYN) IV Stopped (07/23/19 0606)     LOS: 2 days   Time Spent in minutes   45 minutes (greater than 50% of time spent with patient face to face, as well as reviewing old records, calling consults, and formulating a plan)   Kaydynce Pat D.O. on 07/23/2019 at 8:58 AM  Between 7am to 7pm - Please see pager noted on [email or web address]  After 7pm go to [email or web address]  And look for the night coverage person covering for me after hours  [facility] Office  [phone]

## 2019-07-23 NOTE — Anesthesia Preprocedure Evaluation (Addendum)
Anesthesia Evaluation  Patient identified by MRN, date of birth, ID band Patient awake    Reviewed: Allergy & Precautions, NPO status , Patient's Chart, lab work & pertinent test results  History of Anesthesia Complications Negative for: history of anesthetic complications  Airway Mallampati: II  TM Distance: >3 FB Neck ROM: Full    Dental  (+) Dental Advisory Given, Teeth Intact   Pulmonary sleep apnea and Continuous Positive Airway Pressure Ventilation ,    Pulmonary exam normal        Cardiovascular negative cardio ROS Normal cardiovascular exam     Neuro/Psych Seizures -, Well Controlled,   Hemiparesis  negative psych ROS   GI/Hepatic Neg liver ROS, GERD  Medicated and Controlled,  Endo/Other  diabetes, Type 2, Oral Hypoglycemic Agents Obesity   Renal/GU negative Renal ROS     Musculoskeletal negative musculoskeletal ROS (+)   Abdominal   Peds  Hematology negative hematology ROS (+)   Anesthesia Other Findings Sturge Weber Syndrome   Reproductive/Obstetrics                            Anesthesia Physical Anesthesia Plan  ASA: III  Anesthesia Plan: MAC   Post-op Pain Management:    Induction: Intravenous  PONV Risk Score and Plan: 2 and Treatment may vary due to age or medical condition, Ondansetron and Midazolam  Airway Management Planned: Nasal Cannula and Natural Airway  Additional Equipment: None  Intra-op Plan:   Post-operative Plan:   Informed Consent: I have reviewed the patients History and Physical, chart, labs and discussed the procedure including the risks, benefits and alternatives for the proposed anesthesia with the patient or authorized representative who has indicated his/her understanding and acceptance.     Dental advisory given  Plan Discussed with: CRNA and Anesthesiologist  Anesthesia Plan Comments:       Anesthesia Quick Evaluation

## 2019-07-23 NOTE — Progress Notes (Signed)
[facility] for Infectious Disease    Date of Admission:  07/21/2019   Total days of antibiotics 3- day 2 amp/sub          ID: Xavier Turner is a 63 y.o. male with neuro-cutaneous syndrome/sturge-weber admitted for recurrent left lower leg cellulitis related to foot ulcer/corn removal Principal Problem:   Recurrent cellulitis of lower extremity Active Problems:   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HCC)   Hyponatremia   GERD (gastroesophageal reflux disease)   Type 2 diabetes mellitus with hyperlipidemia (HCC)   BPH (benign prostatic hyperplasia)   Lactic acidosis    Subjective: Afebrile. Undergoing vascular studies. Erythema to left leg is much improved. Still tender about ulcer. Underwent MRI that showed fluid collection early abscess with sinus tract to 5th MTH concerning for osteomyelitis  Medications:  . aspirin EC  81 mg Oral Daily  . brimonidine  1 drop Both Eyes TID  . dorzolamide-timolol  1 drop Right Eye BID  . enoxaparin (LOVENOX) injection  40 mg Subcutaneous Q24H  . escitalopram  10 mg Oral Daily  . gabapentin  100 mg Oral BID  . insulin aspart  0-5 Units Subcutaneous QHS  . insulin aspart  0-9 Units Subcutaneous TID WC  . Netarsudil-Latanoprost  1 drop Both Eyes QHS  . pantoprazole  40 mg Oral Daily  . rosuvastatin  20 mg Oral Daily  . sodium chloride flush  3 mL Intravenous Q12H  . tamsulosin  0.4 mg Oral Daily    Objective: Vital signs in last 24 hours: Temp:  [97.2 F (36.2 C)-98.4 F (36.9 C)] 98.4 F (36.9 C) (10/29 1220) Pulse Rate:  [70-78] 70 (10/29 1220) Resp:  [16-18] 18 (10/29 1220) BP: (103-118)/(53-69) 103/53 (10/29 1220) SpO2:  [94 %-95 %] 95 % (10/29 1220) Physical Exam  Constitutional: He is oriented to person, place, and time. He appears well-developed and well-nourished. No distress.  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 No oropharyngeal exudate.  Cardiovascular: Normal rate, regular  rhythm and normal heart sounds. Exam reveals no gallop and no friction rub.  No murmur heard.  Pulmonary/Chest: Effort normal and breath sounds normal. No respiratory distress. He has no wheezes.  Abdominal: Soft. Bowel sounds are normal. He exhibits no distension. There is no tenderness.  Neurological: He is alert and oriented to person, place, and time.  Skin: Skin is warm and dry.erythema to left leg improved but still mildly present. Swelling+ plantar ulcer drying Psychiatric: He has a normal mood and affect. His behavior is normal.    Lab Results Recent Labs    07/21/19 1210 07/22/19 0505 07/23/19 1012  WBC 8.8 6.7  --   HGB 13.2 12.5*  --   HCT 42.2 39.6  --   NA 131* 131* 134*  K 4.0 3.5 3.7  CL 92* 94* 98  CO2 27 27 27   BUN 13 7* 8  CREATININE 0.54* 0.42* 0.47*   Liver Panel No results for input(s): PROT, ALBUMIN, AST, ALT, ALKPHOS, BILITOT, BILIDIR, IBILI in the last 72 hours. Sedimentation Rate Recent Labs    07/21/19 1859  ESRSEDRATE 28*   C-Reactive Protein Recent Labs    07/21/19 1859  CRP 2.3*    Microbiology: 10/27 blood cx ngtd Studies/Results: Mr Foot Left W Wo Contrast  Result Date: 07/23/2019 CLINICAL DATA:  Osteomyelitis, foot swelling, diabetic EXAM: MRI OF THE LEFT FOREFOOT WITHOUT AND WITH CONTRAST TECHNIQUE: Multiplanar, multisequence MR imaging of the left forefoot was performed  both before and after administration of intravenous contrast. CONTRAST:  15mL GADAVIST GADOBUTROL 1 MMOL/ML IV SOLN COMPARISON:  None. FINDINGS: Bones/Joint/Cartilage There is increased marrow signal seen throughout the fifth metatarsal head with subtle T1 hypointensity. There is a small joint effusion seen at the fifth MTP joint. No definite area of cortical destruction however is noted. 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 There is hallux valgus deformity of the forefoot. Ligaments The Lisfranc ligament and partially visualized collateral  ligaments are intact. Muscles and Tendons There is mild fatty atrophy noted within the muscles surrounding the forefoot. There is also diffusely increased signal seen throughout the muscles, likely due to the denervation atrophy. The flexor and extensor tendons appear to be intact. The plantar fascia is intact. Soft tissues Diffuse dorsal soft tissue edema seen. There is a area of superficial ulceration seen overlying the lateral aspect of the fifth MTP joint measuring approximately a 2 cm in length. Small subcutaneous emphysema seen within this region. There is a focal fluid collection measuring approximately 2 cm seen beneath this area which extends to the plantar surface of the fifth metatarsal head. IMPRESSION: 1. Area of superficial ulceration with subcutaneous emphysema and early abscess/sinus tract extending to the fifth metatarsal head. 2. There is findings likely consistent with acute osteomyelitis involving the fifth metatarsal head. 3. Small joint effusion at the fifth MTP joint. Electronically Signed   By: Prudencio Pair M.D.   On: 07/23/2019 14:07   Vas [place] Burnard Bunting With/wo Tbi  Result Date: 07/23/2019 LOWER EXTREMITY DOPPLER STUDY Indications: Ulceration. High Risk Factors: Diabetes.  Limitations: Today's exam was limited due to patient pain tolerance. Comparison Study: No prior studies. Performing Technologist: Carlos Levering Rvt  Examination Guidelines: A complete evaluation includes at minimum, Doppler waveform signals and systolic blood pressure reading at the level of bilateral brachial, anterior tibial, and posterior tibial arteries, when vessel segments are accessible. Bilateral testing is considered an integral part of a complete examination. Photoelectric Plethysmograph (PPG) waveforms and toe systolic pressure readings are included as required and additional duplex testing as needed. Limited examinations for reoccurring indications may be performed as noted.  ABI Findings:  +---------+------------------+-----+---------+--------+ Right    Rt Pressure (mmHg)IndexWaveform Comment  +---------+------------------+-----+---------+--------+ Brachial 118                    triphasic         +---------+------------------+-----+---------+--------+ PTA      130               1.10 triphasic         +---------+------------------+-----+---------+--------+ DP       135               1.14 triphasic         +---------+------------------+-----+---------+--------+ Great Toe101               0.86                   +---------+------------------+-----+---------+--------+ +---------+------------------+-----+---------+-----------+ Left     Lt Pressure (mmHg)IndexWaveform Comment     +---------+------------------+-----+---------+-----------+ Brachial                                 Contracture +---------+------------------+-----+---------+-----------+ PTA      131               1.11 triphasic            +---------+------------------+-----+---------+-----------+  DP       132               1.12 triphasic            +---------+------------------+-----+---------+-----------+ Great Toe85                0.72                      +---------+------------------+-----+---------+-----------+ +-------+-----------+-----------+------------+------------+ ABI/TBIToday's ABIToday's TBIPrevious ABIPrevious TBI +-------+-----------+-----------+------------+------------+ Right  1.14       0.86                                +-------+-----------+-----------+------------+------------+ Left   1.12       0.72                                +-------+-----------+-----------+------------+------------+  Summary: Right: Resting right ankle-brachial index is within normal range. No evidence of significant right lower extremity arterial disease. The right toe-brachial index is normal. Left: Resting left ankle-brachial index is within normal range. No evidence of  significant left lower extremity arterial disease. The left toe-brachial index is normal. The blood pressure cuff was placed on the proximal calf for patient comfort.  *See table(s) above for measurements and observations.  Electronically signed by Servando Snare MD on 07/23/2019 at 4:26:04 PM.    Final      Assessment/Plan: Acute osteomyelitis to 5th MTH of left leg- associated with recurrent cellulitis = continue on amp/sub. Awaiting eval by orthopedics to see what will be offered for management of infection. For now continue on abtx  Cellulitis = improving, continue on amp/sub for now.  [facility] Cell: [phone] Pager: [phone]  07/23/2019, 5:39 PM

## 2019-07-23 NOTE — NC FL2 (Addendum)
[place] LEVEL OF CARE SCREENING TOOL     IDENTIFICATION  Patient Name: Xavier Turner Birthdate: 1956-09-06 Sex: male Admission Date (Current Location): 07/21/2019  [place] and [place] Number:  Kathleen Argue [id number] [place] and Address:  [facility], [place] [address], [place], [place] [address]      Provider Number: [id number]  Attending Physician Name and Address:  Cristal Ford, DO  Relative Name and Phone Number:       Current Level of Care: Hospital Recommended Level of Care: [place] Prior Approval Number:    Date Approved/Denied:   PASRR Number: [id number] End date 08/23/2019  Discharge Plan: SNF    Current Diagnoses: Patient Active Problem List   Diagnosis Date Noted  . Recurrent cellulitis of lower extremity 07/21/2019  . Recurrent cellulitis 07/21/2019  . Lactic acidosis 07/21/2019  . Hyponatremia 07/08/2019  . GERD (gastroesophageal reflux disease) 07/08/2019  . Type 2 diabetes mellitus with hyperlipidemia ([place]) 07/08/2019  . BPH (benign prostatic hyperplasia) 07/08/2019  . Cellulitis 07/06/2019  . 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 HYPERCHOLESTEROLEMIA 10/05/2009  . OBSTRUCTIVE SLEEP APNEA 10/05/2009  . MELANOMA, HX OF 10/05/2009  . SEIZURE DISORDER, HX OF 10/05/2009    Orientation RESPIRATION BLADDER Height & Weight     Self, Time, Situation, Place  Normal Continent Weight: 206 lb 2.1 oz (93.5 kg) Height:  5' 4.25" (163.2 cm)  BEHAVIORAL SYMPTOMS/MOOD NEUROLOGICAL BOWEL NUTRITION STATUS    (pt has ID/D) Continent Diet(see discharge summary)  AMBULATORY STATUS COMMUNICATION OF NEEDS Skin   Limited Assist Verbally Other (Comment)(left lower leg cellulitis and diabetic foot ulcer with gauze dressings changed daily)- compression wrap now on foot post op                       Personal Care Assistance Level of Assistance  Bathing,  Feeding, Dressing Bathing Assistance: Limited assistance Feeding assistance: Independent Dressing Assistance: Limited assistance     Functional Limitations Info  Sight, Hearing, Speech Sight Info: Impaired Hearing Info: Adequate Speech Info: Adequate    SPECIAL CARE FACTORS FREQUENCY  OT (By licensed OT), PT (By licensed PT)     PT Frequency: 5x week OT Frequency: 5x week            Contractures Contractures Info: Not present    Additional Factors Info  Code Status, Allergies, Psychotropic, Insulin Sliding Scale Code Status Info: Full Code Allergies Info: No Active Allergies Psychotropic Info: escitalopram (LEXAPRO) tablet 10 mg daily PO Insulin Sliding Scale Info: insulin aspart (novoLOG) injection 0-9 Units 3x daily with meals; insulin aspart (novoLOG) injection 0-5 Units       Current Medications (07/23/2019):  This is the current hospital active medication list Current Facility-Administered Medications  Medication Dose Route Frequency Provider Last Rate Last Dose  . acetaminophen (TYLENOL) tablet 650 mg  650 mg Oral Q6H PRN Norval Morton, MD   650 mg at 07/23/19 1639   Or  . acetaminophen (TYLENOL) suppository 650 mg  650 mg Rectal Q6H PRN Smith, Rondell A, MD      . albuterol (PROVENTIL) (2.5 MG/3ML) 0.083% nebulizer solution 2.5 mg  2.5 mg Nebulization Q6H PRN Smith, Rondell A, MD      . Ampicillin-Sulbactam (UNASYN) 3 g in sodium chloride 0.9 % 100 mL IVPB  3 g Intravenous Q6H Carlyle Basques, MD 200 mL/hr at 07/23/19 1402 3 g at 07/23/19 1402  .  aspirin EC tablet 81 mg  81 mg Oral Daily Fuller Plan A, MD   81 mg at 07/23/19 0923  . brimonidine (ALPHAGAN) 0.2 % ophthalmic solution 1 drop  1 drop Both Eyes TID Fuller Plan A, MD   1 drop at 07/23/19 0925  . dorzolamide-timolol (COSOPT) 22.3-6.8 MG/ML ophthalmic solution 1 drop  1 drop Right Eye BID Fuller Plan A, MD   1 drop at 07/23/19 0925  . enoxaparin (LOVENOX) injection 40 mg  40 mg Subcutaneous Q24H  Tamala Julian, Rondell A, MD   40 mg at 07/23/19 1639  . escitalopram (LEXAPRO) tablet 10 mg  10 mg Oral Daily Fuller Plan A, MD   10 mg at 07/23/19 0923  . gabapentin (NEURONTIN) capsule 100 mg  100 mg Oral BID Fuller Plan A, MD   100 mg at 07/23/19 0923  . insulin aspart (novoLOG) injection 0-5 Units  0-5 Units Subcutaneous QHS Smith, Rondell A, MD      . insulin aspart (novoLOG) injection 0-9 Units  0-9 Units Subcutaneous TID WC Fuller Plan A, MD   3 Units at 07/23/19 1128  . Netarsudil-Latanoprost 0.02-0.005 % SOLN 1 drop  1 drop Both Eyes QHS Norval Morton, MD   Stopped at 07/22/19 2159  . nivea cream 1 application  1 application Topical PRN Smith, Rondell A, MD      . ondansetron (ZOFRAN) tablet 4 mg  4 mg Oral Q6H PRN Fuller Plan A, MD       Or  . ondansetron (ZOFRAN) injection 4 mg  4 mg Intravenous Q6H PRN Smith, Rondell A, MD      . pantoprazole (PROTONIX) EC tablet 40 mg  40 mg Oral Daily Fuller Plan A, MD   40 mg at 07/23/19 0923  . rosuvastatin (CRESTOR) tablet 20 mg  20 mg Oral Daily Fuller Plan A, MD   20 mg at 07/23/19 0924  . sodium chloride flush (NS) 0.9 % injection 3 mL  3 mL Intravenous Q12H Smith, Rondell A, MD   3 mL at 07/22/19 2201  . tamsulosin (FLOMAX) capsule 0.4 mg  0.4 mg Oral Daily Fuller Plan A, MD   0.4 mg at 07/23/19 [id number]     Discharge Medications: Please see discharge summary for a list of discharge medications.  Relevant Imaging Results:  Relevant Lab Results:   Additional Information [id number] [place], [place]

## 2019-07-23 NOTE — H&P (View-Only) (Signed)
ORTHOPAEDIC CONSULTATION  REQUESTING PHYSICIAN: Cristal Ford, DO  Chief Complaint: Pain cellulitis left foot with chronic ulceration fifth metatarsal head.  HPI: Xavier Turner is a 63 y.o. male who presents with diabetic insensate neuropathy with a Wagner grade 3 ulcer beneath the fifth metatarsal head left foot patient is undergoing conservative therapy including antibiotics and most recently is just completed an MRI scan of the foot.  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[place])    Past Surgical History:  Procedure Laterality Date   COLONOSCOPY     CRANIOTOMY  1999   melanoma removal  2011   Social History   Socioeconomic History   Marital status: Single    Spouse name: Not on file   Number of children: Not on file   Years of education: Not on file   Highest education level: Not on file  Occupational History   Occupation: [employer]    Comment: resides in a group home([facility])  Social Needs   Emergency planning/management officer strain: Not on file   Food insecurity    Worry: Not on file    Inability: Not on file   Transportation needs    Medical: Not on file    Non-medical: Not on file  Tobacco Use   Smoking status: Never Smoker   Smokeless tobacco: Never Used  Substance and Sexual Activity   Alcohol use: Not on file   Drug use: Not on file   Sexual activity: Not on file  Lifestyle   Physical activity    Days per week: Not on file    Minutes per session: Not on file   Stress: Not on file  Relationships   Social connections    Talks on phone: Not on file    Gets together: Not on file    Attends religious service: Not on file    Active member of club or  organization: Not on file    Attends meetings of clubs or organizations: Not on file    Relationship status: Not on file  Other Topics Concern   Not on file  Social History Narrative   Not on file   Family History  Problem Relation Age of Onset   Emphysema Father    Cancer Father    Asthma Mother    - negative except otherwise stated in the family history section No Active Allergies Prior to Admission medications   Medication Sig Start Date End Date Taking? Authorizing Provider  ARTIFICIAL TEAR OP Place 1 drop into both eyes 4 (four) times daily as needed (dry eyes).    Yes [provider]  aspirin 81 MG tablet Take 81 mg by mouth daily.     Yes [provider]  brimonidine (ALPHAGAN) 0.2 % ophthalmic solution Place 1 drop into both eyes 3 (three) times daily. 06/24/19  Yes [provider]  cholecalciferol (VITAMIN D3) 25 MCG (1000 UT) tablet Take 1,000 Units by mouth daily.   Yes [provider]  dorzolamide-timolol (COSOPT) 22.3-6.8 MG/ML ophthalmic solution Place 1 drop into the right eye 2 (two) times daily.     Yes [provider]  Emollient (EUCERIN SKIN CALMING) CREA Apply 1 application topically as needed (dry skin).   Yes [provider]  escitalopram (LEXAPRO) 10 MG tablet Take 10 mg by mouth daily.   Yes [provider]  gabapentin (NEURONTIN) 100 MG capsule Take 100 mg by mouth 2 (two) times daily.   Yes [provider]  hydrochlorothiazide (HYDRODIURIL) 25 MG tablet Take 25 mg by mouth daily.   Yes [provider]  meloxicam (MOBIC) 7.5 MG tablet Take 7.5 mg by mouth daily.   Yes [provider]  metFORMIN (GLUCOPHAGE) 1000 MG tablet Take 1,000 mg by mouth 2 (two) times daily with a meal.   Yes [provider]  omeprazole (PRILOSEC) 20 MG capsule Take 20 mg by mouth daily.    Yes [provider]  ROCKLATAN 0.02-0.005 % SOLN Place 1 drop into both eyes at bedtime.  06/12/19  Yes [provider]  rosuvastatin (CRESTOR) 20 MG tablet Take 20 mg by mouth daily.   Yes [provider]  sitaGLIPtin (JANUVIA) 100 MG tablet Take 100 mg by mouth daily.   Yes [provider]  tamsulosin (FLOMAX) 0.4 MG CAPS Take 0.4 mg by mouth daily.   Yes [provider]  Armodafinil 250 MG tablet Take 0.5 tablets (125 mg total) by mouth 2 (two) times daily. Patient not taking: Reported on 06/29/2019 01/14/14   Dohmeier, Asencion Partridge, MD   Mr Foot Left W Wo Contrast  Result Date: 07/23/2019 CLINICAL DATA:  Osteomyelitis, foot swelling, diabetic EXAM: MRI OF THE LEFT FOREFOOT WITHOUT AND WITH CONTRAST TECHNIQUE: Multiplanar, multisequence MR imaging of the left forefoot was performed both before and after administration of intravenous contrast. CONTRAST:  57mL GADAVIST GADOBUTROL 1 MMOL/ML IV SOLN COMPARISON:  None. FINDINGS: Bones/Joint/Cartilage There is increased marrow signal seen throughout the fifth metatarsal head with subtle T1 hypointensity. There is a small joint effusion seen at the fifth MTP joint. No definite area of cortical destruction however is noted. 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 There is hallux valgus deformity of the forefoot. Ligaments The Lisfranc ligament and partially visualized collateral ligaments are intact. Muscles and Tendons There is mild fatty atrophy noted within the muscles surrounding the forefoot. There is also diffusely increased signal seen throughout the muscles, likely due to the denervation atrophy. The flexor and extensor tendons appear to be intact. The plantar fascia is intact. Soft tissues Diffuse dorsal soft tissue edema seen. There is a area of superficial ulceration seen overlying the lateral aspect of the fifth MTP joint measuring approximately a 2 cm in length. Small subcutaneous emphysema seen within this region. There is a focal fluid collection measuring approximately 2 cm seen  beneath this area which extends to the plantar surface of the fifth metatarsal head. IMPRESSION: 1. Area of superficial ulceration with subcutaneous emphysema and early abscess/sinus tract extending to the fifth metatarsal head. 2. There is findings likely consistent with acute osteomyelitis involving the fifth metatarsal head. 3. Small joint effusion at the fifth MTP joint. Electronically Signed   By: Prudencio Pair M.D.   On: 07/23/2019 14:07   Vas [place] Burnard Bunting With/wo Tbi  Result Date: 07/23/2019 LOWER EXTREMITY DOPPLER STUDY Indications: Ulceration. High Risk Factors: Diabetes.  Limitations: Today's exam was limited due to patient pain tolerance. Comparison Study: No prior studies. Performing Technologist: Carlos Levering Rvt  Examination Guidelines: A complete evaluation includes at minimum, Doppler waveform signals and systolic blood pressure reading at the level of bilateral brachial,  anterior tibial, and posterior tibial arteries, when vessel segments are accessible. Bilateral testing is considered an integral part of a complete examination. Photoelectric Plethysmograph (PPG) waveforms and toe systolic pressure readings are included as required and additional duplex testing as needed. Limited examinations for reoccurring indications may be performed as noted.  ABI Findings: +---------+------------------+-----+---------+--------+  Right     Rt Pressure (mmHg) Index Waveform  Comment   +---------+------------------+-----+---------+--------+  Brachial  118                      triphasic           +---------+------------------+-----+---------+--------+  PTA       130                1.10  triphasic           +---------+------------------+-----+---------+--------+  DP        135                1.14  triphasic           +---------+------------------+-----+---------+--------+  Great Toe 101                0.86                      +---------+------------------+-----+---------+--------+  +---------+------------------+-----+---------+-----------+  Left      Lt Pressure (mmHg) Index Waveform  Comment      +---------+------------------+-----+---------+-----------+  Brachial                                     Contracture  +---------+------------------+-----+---------+-----------+  PTA       131                1.11  triphasic              +---------+------------------+-----+---------+-----------+  DP        132                1.12  triphasic              +---------+------------------+-----+---------+-----------+  Great Toe 85                 0.72                         +---------+------------------+-----+---------+-----------+ +-------+-----------+-----------+------------+------------+  ABI/TBI Today's ABI Today's TBI Previous ABI Previous TBI  +-------+-----------+-----------+------------+------------+  Right   1.14        0.86                                   +-------+-----------+-----------+------------+------------+  Left    1.12        0.72                                   +-------+-----------+-----------+------------+------------+  Summary: Right: Resting right ankle-brachial index is within normal range. No evidence of significant right lower extremity arterial disease. The right toe-brachial index is normal. Left: Resting left ankle-brachial index is within normal range. No evidence of significant left lower extremity arterial disease. The left toe-brachial index is normal. The blood pressure cuff was placed on the proximal calf for patient comfort.  *See table(s) above for measurements and observations.  Electronically signed by Servando Snare MD on 07/23/2019 at 4:26:04 PM.    Final    - pertinent xrays, CT, MRI studies were reviewed and independently interpreted  Positive ROS: All other systems have been reviewed and were otherwise negative with the exception of those mentioned in the HPI and as above.  Physical Exam: General: Alert, no acute distress Psychiatric: Patient is  competent for consent with normal mood and affect Lymphatic: No axillary or cervical lymphadenopathy Cardiovascular: No pedal edema Respiratory: No cyanosis, no use of accessory musculature GI: No organomegaly, abdomen is soft and non-tender    Images:  @ENCIMAGES @  Labs:  Lab Results  Component Value Date   HGBA1C 6.6 (H) 07/06/2019   ESRSEDRATE 28 (H) 07/21/2019   ESRSEDRATE 54 (H) 07/06/2019   CRP 2.3 (H) 07/21/2019   CRP 0.8 07/07/2019   CRP <0.8 07/06/2019   REPTSTATUS PENDING 07/21/2019   CULT  07/21/2019    NO GROWTH 2 DAYS Performed at [facility], [place] [address] [place], [place] [address]     No results found for: ALBUMIN, PREALBUMIN, LABURIC  Neurologic: Patient does not have protective sensation bilateral lower extremities.   MUSCULOSKELETAL:   Skin: Examination patient has painful cellulitis involving the forefoot and midfoot on the left.  He has an ulcer beneath the fifth metatarsal head.  Patient has a good dorsalis pedis pulse.  Review of the MRI scan shows osteomyelitis of the fifth metatarsal head as well as an abscess surrounding the fifth metatarsal head and enter ulceration that goes down to bone.  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  Plan: Plan: Have discussed the best option is to proceed with 1/5 ray amputation.  Risks and benefits were discussed including risk of the wound not healing.  Patient states he understands wishes to proceed at this time.  We will plan for surgery tomorrow patient will need to be nonweightbearing for about 2 weeks.  Anticipate continue antibiotics for about 72 hours postoperatively due to the extensive cellulitis in the midfoot and forefoot.  Thank you for the consult and the opportunity to see Xavier Turner, [place] [phone] 6:22 PM

## 2019-07-23 NOTE — Consult Note (Signed)
Reason for Consult:Foot ulcer Referring Physician: Aeson Turner is an 63 y.o. male.  HPI: Xavier Turner was admitted with cellulitis of the left foot. He also has a foot ulcer on the same foot and ID recommended orthopedic surgery consultation. He notes the ulcer has been there about 3 weeks. He had a callus there that was debrided by podiatry about 3 months ago and it never fully healed it sounds like. He has had some discharge from the wound as well as significant pain, esp when he wears shoes. He denies prior hx/o foot ulcers.  Past Medical History:  Diagnosis Date  . Diabetes ([place])   . Glaucoma   . Hemiparesis ([place])   . Hypercholesterolemia   . Hyperlipemia   . Melanoma ([place])   . Melanoma ([place])   . Obesity   . OSA (obstructive sleep apnea)   . Seizure ([place])   . Seizure disorder ([place])   . 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 COLONOSCOPY    . CRANIOTOMY  1999  . melanoma removal  2011    Family History  Problem Relation Age of Onset  . Emphysema Father   . Cancer Father   . Asthma Mother     Social History:  reports that he has never smoked. He has never used smokeless tobacco. No history on file for alcohol and drug.  Allergies: No Active Allergies  Medications: I have reviewed the patient's current medications.  Results for orders placed or performed during the hospital encounter of 07/21/19 (from the past 48 hour(s))  CBC with Differential     Status: None   Collection Time: 07/21/19 12:10 PM  Result Value Ref Range   WBC 8.8 4.0 - 10.5 K/uL   RBC 4.78 4.22 - 5.81 MIL/uL   Hemoglobin 13.2 13.0 - 17.0 g/dL   HCT 42.2 39.0 - 52.0 %   MCV 88.3 80.0 - 100.0 fL   MCH 27.6 26.0 - 34.0 pg   MCHC 31.3 30.0 - 36.0 g/dL   RDW 14.5 11.5 - 15.5 %   Platelets 247 150 - 400 K/uL   nRBC 0.0 0.0 - 0.2 %   Neutrophils Relative % 74 %   Neutro Abs 6.6 1.7 - 7.7 K/uL   Lymphocytes Relative 14 %    Lymphs Abs 1.2 0.7 - 4.0 K/uL   Monocytes Relative 9 %   Monocytes Absolute 0.8 0.1 - 1.0 K/uL   Eosinophils Relative 2 %   Eosinophils Absolute 0.2 0.0 - 0.5 K/uL   Basophils Relative 1 %   Basophils Absolute 0.0 0.0 - 0.1 K/uL   Immature Granulocytes 0 %   Abs Immature Granulocytes 0.03 0.00 - 0.07 K/uL    Comment: Performed at [facility], [address] [place], [place] [id number]  Basic metabolic panel     Status: Abnormal   Collection Time: 07/21/19 12:10 PM  Result Value Ref Range   Sodium 131 (L) 135 - 145 mmol/L   Potassium 4.0 3.5 - 5.1 mmol/L   Chloride 92 (L) 98 - 111 mmol/L   CO2 27 22 - 32 mmol/L   Glucose, Bld 154 (H) 70 - 99 mg/dL   BUN 13 8 - 23 mg/dL   Creatinine, Ser 0.54 (L) 0.61 - 1.24 mg/dL   Calcium 9.1 8.9 - 10.3 mg/dL   GFR calc non Af Amer >60 >60 mL/min   GFR  calc Af Amer >60 >60 mL/min   Anion gap 12 5 - 15    Comment: Performed at [facility] [address] [place], [place] [address]  Lactic acid, plasma     Status: Abnormal   Collection Time: 07/21/19 12:29 PM  Result Value Ref Range   Lactic Acid, Venous 2.5 (HH) 0.5 - 1.9 mmol/L    Comment: CRITICAL RESULT CALLED TO, READ BACK BY AND VERIFIED WITH: C.Abrom [facility] 07/21/2019 1304 DAVISB Performed at [facility] [address] [place], [place] [address]   Blood culture (routine x 2)     Status: None (Preliminary result)   Collection Time: 07/21/19  1:50 PM   Specimen: BLOOD RIGHT WRIST  Result Value Ref Range   Specimen Description BLOOD RIGHT WRIST    Special Requests      BOTTLES DRAWN AEROBIC AND ANAEROBIC Blood Culture results may not be optimal due to an inadequate volume of blood received in culture bottles   Culture      NO GROWTH 2 DAYS Performed at [facility], [place] [address] [place], [place] [address]    Report Status PENDING   Novel Coronavirus, NAA (Hosp order, Send-out to Ref Lab; TAT 18-24 hrs     Status: None   Collection Time: 07/21/19  2:06  PM   Specimen: Nasopharyngeal Swab; Respiratory  Result Value Ref Range   SARS-CoV-2, NAA NOT DETECTED NOT DETECTED    Comment: (NOTE) This nucleic acid amplification test was developed and its performance characteristics determined by [employer]. Nucleic acid amplification tests include PCR and TMA. This test has not been FDA cleared or approved. This test has been authorized by FDA under an Emergency Use Authorization (EUA). This test is only authorized for the duration of time the declaration that circumstances exist justifying the authorization of the emergency use of in vitro diagnostic tests for detection of SARS-CoV-2 virus and/or diagnosis of COVID-19 infection under section 564(b)(1) of the Act, 21 U.S.C. [id number]) (1), unless the authorization is terminated or revoked sooner. When diagnostic testing is negative, the possibility of a false negative result should be considered in the context of a patient's recent exposures and the presence of clinical signs and symptoms consistent with COVID-19. An individual without symptoms of COVID- 19 and who is not shedding SARS-CoV-2 vi rus would expect to have a negative (not detected) result in this assay. Performed At: [facility] [place], [place] [id number] Rush Farmer MD [id number]    Coronavirus Source NASOPHARYNGEAL     Comment: Performed at [facility], [place] [address], [place], [place] [address]  Lactic acid, plasma     Status: None   Collection Time: 07/21/19  3:37 PM  Result Value Ref Range   Lactic Acid, Venous 1.4 0.5 - 1.9 mmol/L    Comment: Performed at [facility] [address] [place], [place] [address]  Blood culture (routine x 2)     Status: None (Preliminary result)   Collection Time: 07/21/19  3:37 PM   Specimen: BLOOD RIGHT WRIST  Result Value Ref Range   Specimen Description BLOOD RIGHT WRIST    Special Requests      BOTTLES DRAWN AEROBIC ONLY Blood  Culture results may not be optimal due to an inadequate volume of blood received in culture bottles   Culture      NO GROWTH 2 DAYS Performed at [facility], [place] [address] [place], [place]  [id number]    Report Status PENDING   Glucose, capillary     Status: Abnormal   Collection Time: 07/21/19  5:25 PM  Result Value Ref Range   Glucose-Capillary 107 (H) 70 - 99 mg/dL  C-reactive protein     Status: Abnormal   Collection Time: 07/21/19  6:59 PM  Result Value Ref Range   CRP 2.3 (H) <1.0 mg/dL    Comment: Performed at [facility], [place] [address] [place], [place] [address]  Sedimentation rate     Status: Abnormal   Collection Time: 07/21/19  6:59 PM  Result Value Ref Range   Sed Rate 28 (H) 0 - 16 mm/hr    Comment: Performed at [facility] [address], [place], [place] [address]  Glucose, capillary     Status: Abnormal   Collection Time: 07/21/19  9:41 PM  Result Value Ref Range   Glucose-Capillary 139 (H) 70 - 99 mg/dL  Basic metabolic panel     Status: Abnormal   Collection Time: 07/22/19  5:05 AM  Result Value Ref Range   Sodium 131 (L) 135 - 145 mmol/L   Potassium 3.5 3.5 - 5.1 mmol/L   Chloride 94 (L) 98 - 111 mmol/L   CO2 27 22 - 32 mmol/L   Glucose, Bld 106 (H) 70 - 99 mg/dL   BUN 7 (L) 8 - 23 mg/dL   Creatinine, Ser 0.42 (L) 0.61 - 1.24 mg/dL   Calcium 8.6 (L) 8.9 - 10.3 mg/dL   GFR calc non Af Amer >60 >60 mL/min   GFR calc Af Amer >60 >60 mL/min   Anion gap 10 5 - 15    Comment: Performed at [facility] [address] [place], [place] [address]  CBC     Status: Abnormal   Collection Time: 07/22/19  5:05 AM  Result Value Ref Range   WBC 6.7 4.0 - 10.5 K/uL   RBC 4.61 4.22 - 5.81 MIL/uL   Hemoglobin 12.5 (L) 13.0 - 17.0 g/dL   HCT 39.6 39.0 - 52.0 %   MCV 85.9 80.0 - 100.0 fL   MCH 27.1 26.0 - 34.0 pg   MCHC 31.6 30.0 - 36.0 g/dL   RDW 14.3 11.5 - 15.5 %   Platelets 215 150 - 400 K/uL   nRBC 0.0 0.0 - 0.2 %    Comment:  Performed at [facility], [place] [address] [place], [place] [address]  Glucose, capillary     Status: None   Collection Time: 07/22/19  5:39 AM  Result Value Ref Range   Glucose-Capillary 97 70 - 99 mg/dL  Glucose, capillary     Status: Abnormal   Collection Time: 07/22/19 11:07 AM  Result Value Ref Range   Glucose-Capillary 129 (H) 70 - 99 mg/dL  Glucose, capillary     Status: Abnormal   Collection Time: 07/22/19  4:19 PM  Result Value Ref Range   Glucose-Capillary 106 (H) 70 - 99 mg/dL   Comment 1 Notify RN   Glucose, capillary     Status: Abnormal   Collection Time: 07/22/19  9:19 PM  Result Value Ref Range   Glucose-Capillary 160 (H) 70 - 99 mg/dL  Glucose, capillary     Status: None   Collection Time: 07/23/19  5:39 AM  Result Value Ref Range   Glucose-Capillary 99 70 - 99 mg/dL    Dg Foot Complete Left  Result Date: 07/21/2019 CLINICAL DATA:  Infected wound, left  foot and leg swelling EXAM: LEFT FOOT - COMPLETE 3+ VIEW COMPARISON:  None. FINDINGS: The osseous structures appear diffusely demineralized which may limit detection of small or nondisplaced fractures. Circumferential swelling of the foot. Focal ulceration noted along the lateral base of the fifth metatarsophalangeal joint with subcutaneous gas and few punctate radiodensities which appear confined to the skin surface. No subjacent osseous erosion or periostitis. Large osteophyte formations are noted at the bases of the third fourth and fifth proximal phalanges. Additional degenerative changes are noted throughout the mid and hindfoot. Large corticated os peroneum is noted IMPRESSION: 1. Focal ulceration along the lateral base of the fifth proximal phalanx joint with subcutaneous gas and few punctate radiodensities which appear confined to the skin surface. No subjacent osseous erosion to suggest early radiographic signs of osteomyelitis however MRI would be more sensitive and specific for early features. 2.  Circumferential swelling of the foot. 3. Degenerative changes as above. Electronically Signed   By: Lovena Le M.D.   On: 07/21/2019 14:39    Review of Systems  Constitutional: Negative for weight loss.  HENT: Negative for ear discharge, ear pain, hearing loss and tinnitus.   Eyes: Negative for blurred vision, double vision, photophobia and pain.  Respiratory: Negative for cough, sputum production and shortness of breath.   Cardiovascular: Negative for chest pain.  Gastrointestinal: Negative for abdominal pain, nausea and vomiting.  Genitourinary: Negative for dysuria, flank pain, frequency and urgency.  Musculoskeletal: Positive for joint pain (Left foot). Negative for back pain, falls, myalgias and neck pain.  Neurological: Negative for dizziness, tingling, sensory change, focal weakness, loss of consciousness and headaches.  Endo/Heme/Allergies: Does not bruise/bleed easily.  Psychiatric/Behavioral: Negative for depression, memory loss and substance abuse. The patient is not nervous/anxious.    Blood pressure 118/69, pulse 75, temperature 98 F (36.7 C), temperature source Oral, resp. rate 18, height 5' 4.25" (1.632 m), weight 93.5 kg, SpO2 95 %. Physical Exam  Constitutional: He appears well-developed and well-nourished. No distress.  HENT:  Head: Normocephalic and atraumatic.  Eyes: Conjunctivae are normal. Right eye exhibits no discharge. Left eye exhibits no discharge. No scleral icterus.  Neck: Normal range of motion.  Cardiovascular: Normal rate and regular rhythm.  Respiratory: Effort normal. No respiratory distress.  Musculoskeletal:     Comments: LLE No traumatic wounds, ecchymosis, or rash  Erythema midfoot, esp laterally, small ulceration plantar aspect 5th MTP joint.  No knee or ankle effusion  Knee stable to varus/ valgus and anterior/posterior stress  Sens DPN, SPN, TN intact  Motor EHL, ext, flex, evers 5/5  DP 2+, PT 0, 2+ pitting edema  Neurological: He is  alert.  Skin: Skin is warm and dry. He is not diaphoretic.  Psychiatric: He has a normal mood and affect. His behavior is normal.    Assessment/Plan: Left foot diabetic ulcer -- Will check MRI to r/o osteo. Dr. Sharol Given to evaluate later today or in AM. Multiple medical problems including diabetes mellitus type 2, hypertension, hyperlipidemia, OSA, and Sturge-Weber syndrome involving face -- per primary service    Lisette Abu, PA-C Orthopedic Surgery [phone] 07/23/2019, 10:04 AM

## 2019-07-24 ENCOUNTER — Inpatient Hospital Stay (HOSPITAL_COMMUNITY): Payer: Medicare Other | Admitting: Anesthesiology

## 2019-07-24 ENCOUNTER — Encounter (HOSPITAL_COMMUNITY): Payer: Self-pay | Admitting: Certified Registered Nurse Anesthetist

## 2019-07-24 ENCOUNTER — Encounter (HOSPITAL_COMMUNITY)
Admission: EM | Disposition: A | Discharge status: Skilled Nursing Facility | Payer: Self-pay | Source: Home / Self Care | Attending: Internal Medicine

## 2019-07-24 DIAGNOSIS — L02612 Cutaneous abscess of left foot: Secondary | ICD-10-CM

## 2019-07-24 DIAGNOSIS — E871 Hypo-osmolality and hyponatremia: Secondary | ICD-10-CM | POA: Diagnosis not present

## 2019-07-24 DIAGNOSIS — L03119 Cellulitis of unspecified part of limb: Secondary | ICD-10-CM | POA: Diagnosis not present

## 2019-07-24 DIAGNOSIS — N400 Enlarged prostate without lower urinary tract symptoms: Secondary | ICD-10-CM | POA: Diagnosis not present

## 2019-07-24 DIAGNOSIS — L03116 Cellulitis of left lower limb: Secondary | ICD-10-CM | POA: Diagnosis not present

## 2019-07-24 DIAGNOSIS — K219 Gastro-esophageal reflux disease without esophagitis: Secondary | ICD-10-CM | POA: Diagnosis not present

## 2019-07-24 DIAGNOSIS — L03032 Cellulitis of left toe: Secondary | ICD-10-CM | POA: Diagnosis not present

## 2019-07-24 DIAGNOSIS — M86172 Other acute osteomyelitis, left ankle and foot: Secondary | ICD-10-CM | POA: Diagnosis not present

## 2019-07-24 DIAGNOSIS — Z89422 Acquired absence of other left toe(s): Secondary | ICD-10-CM

## 2019-07-24 HISTORY — PX: AMPUTATION: SHX166

## 2019-07-24 LAB — BASIC METABOLIC PANEL
Anion gap: 9 (ref 5–15)
BUN: 8 mg/dL (ref 8–23)
CO2: 26 mmol/L (ref 22–32)
Calcium: 8.8 mg/dL — ABNORMAL LOW (ref 8.9–10.3)
Chloride: 100 mmol/L (ref 98–111)
Creatinine, Ser: 0.48 mg/dL — ABNORMAL LOW (ref 0.61–1.24)
GFR calc Af Amer: >60 mL/min (ref >60)
GFR calc non Af Amer: >60 mL/min (ref >60)
Glucose, Bld: 108 mg/dL — ABNORMAL HIGH (ref 70–99)
Potassium: 3.9 mmol/L (ref 3.5–5.1)
Sodium: 135 mmol/L (ref 135–145)

## 2019-07-24 LAB — GLUCOSE, CAPILLARY
Glucose-Capillary: 112 mg/dL — ABNORMAL HIGH (ref 70–99)
Glucose-Capillary: 112 mg/dL — ABNORMAL HIGH (ref 70–99)
Glucose-Capillary: 241 mg/dL — ABNORMAL HIGH (ref 70–99)
Glucose-Capillary: 252 mg/dL — ABNORMAL HIGH (ref 70–99)
Glucose-Capillary: 132 mg/dL — ABNORMAL HIGH (ref 70–99)
Glucose-Capillary: 157 mg/dL — ABNORMAL HIGH (ref 70–99)

## 2019-07-24 LAB — SURGICAL PCR SCREEN
MRSA, PCR: NEGATIVE
Staphylococcus aureus: NEGATIVE

## 2019-07-24 LAB — SARS CORONAVIRUS 2 BY RT PCR (HOSPITAL ORDER, PERFORMED IN ~~LOC~~ HOSPITAL LAB): SARS Coronavirus 2: NEGATIVE

## 2019-07-24 SURGERY — AMPUTATION, FOOT, RAY
Anesthesia: General | Site: Fifth Toe | Laterality: Left

## 2019-07-24 MED ORDER — MORPHINE SULFATE (PF) 2 MG/ML IV SOLN
2.0000 mg | INTRAVENOUS | Status: DC | PRN
  Administered 2019-07-24: 2 mg via INTRAVENOUS
  Filled 2019-07-24: qty 1

## 2019-07-24 MED ORDER — PROPOFOL 10 MG/ML IV BOLUS
INTRAVENOUS | Status: DC | PRN
  Administered 2019-07-24: 20 mg via INTRAVENOUS

## 2019-07-24 MED ORDER — MIDAZOLAM HCL 2 MG/2ML IJ SOLN
INTRAMUSCULAR | Status: AC
  Filled 2019-07-24: qty 2

## 2019-07-24 MED ORDER — FENTANYL CITRATE (PF) 100 MCG/2ML IJ SOLN
INTRAMUSCULAR | Status: DC | PRN
  Administered 2019-07-24: 50 ug via INTRAVENOUS

## 2019-07-24 MED ORDER — LACTATED RINGERS IV SOLN
INTRAVENOUS | Status: DC | PRN
  Administered 2019-07-24: 08:00:00 via INTRAVENOUS

## 2019-07-24 MED ORDER — LATANOPROST 0.005 % OP SOLN
1.0000 [drp] | Freq: Every day | OPHTHALMIC | Status: DC
  Administered 2019-07-24 – 2019-07-27 (×4): 1 [drp] via OPHTHALMIC
  Filled 2019-07-24: qty 2.5

## 2019-07-24 MED ORDER — METOCLOPRAMIDE HCL 5 MG/ML IJ SOLN: 5.0000 mg | Freq: Three times a day (TID) | INTRAMUSCULAR | Status: DC | PRN

## 2019-07-24 MED ORDER — TRAMADOL HCL 50 MG PO TABS
50.0000 mg | ORAL_TABLET | Freq: Four times a day (QID) | ORAL | Status: DC | PRN
  Administered 2019-07-24 – 2019-07-25 (×3): 50 mg via ORAL
  Filled 2019-07-24 (×3): qty 1

## 2019-07-24 MED ORDER — FENTANYL CITRATE (PF) 250 MCG/5ML IJ SOLN
INTRAMUSCULAR | Status: AC
  Filled 2019-07-24: qty 5

## 2019-07-24 MED ORDER — MIDAZOLAM HCL 5 MG/5ML IJ SOLN
INTRAMUSCULAR | Status: DC | PRN
  Administered 2019-07-24: 2 mg via INTRAVENOUS

## 2019-07-24 MED ORDER — PROPOFOL 10 MG/ML IV BOLUS
INTRAVENOUS | Status: AC
  Filled 2019-07-24: qty 20

## 2019-07-24 MED ORDER — 0.9 % SODIUM CHLORIDE (POUR BTL) OPTIME
TOPICAL | Status: DC | PRN
  Administered 2019-07-24: 1000 mL

## 2019-07-24 MED ORDER — BUPIVACAINE HCL (PF) 0.25 % IJ SOLN
INTRAMUSCULAR | Status: DC | PRN
  Administered 2019-07-24: 30 mL

## 2019-07-24 MED ORDER — ONDANSETRON HCL 4 MG/2ML IJ SOLN
INTRAMUSCULAR | Status: DC | PRN
  Administered 2019-07-24: 4 mg via INTRAVENOUS

## 2019-07-24 MED ORDER — BUPIVACAINE HCL (PF) 0.25 % IJ SOLN
INTRAMUSCULAR | Status: AC
  Filled 2019-07-24: qty 30

## 2019-07-24 MED ORDER — ONDANSETRON HCL 4 MG/2ML IJ SOLN
INTRAMUSCULAR | Status: AC
  Filled 2019-07-24: qty 2

## 2019-07-24 MED ORDER — HYDROCODONE-ACETAMINOPHEN 5-325 MG PO TABS
1.0000 | ORAL_TABLET | ORAL | Status: DC | PRN
  Administered 2019-07-25 (×3): 1 via ORAL
  Filled 2019-07-24 (×3): qty 1

## 2019-07-24 MED ORDER — PHENYLEPHRINE 40 MCG/ML (10ML) SYRINGE FOR IV PUSH (FOR BLOOD PRESSURE SUPPORT)
PREFILLED_SYRINGE | INTRAVENOUS | Status: AC
  Filled 2019-07-24: qty 10

## 2019-07-24 MED ORDER — DOCUSATE SODIUM 100 MG PO CAPS
100.0000 mg | ORAL_CAPSULE | Freq: Two times a day (BID) | ORAL | Status: DC
  Administered 2019-07-24 – 2019-07-27 (×7): 100 mg via ORAL
  Filled 2019-07-24 (×7): qty 1

## 2019-07-24 MED ORDER — METOCLOPRAMIDE HCL 5 MG PO TABS: 5.0000 mg | ORAL_TABLET | Freq: Three times a day (TID) | ORAL | Status: DC | PRN

## 2019-07-24 MED ORDER — PROPOFOL 500 MG/50ML IV EMUL
INTRAVENOUS | Status: DC | PRN
  Administered 2019-07-24: 100 ug/kg/min via INTRAVENOUS

## 2019-07-24 SURGICAL SUPPLY — 29 items
BLADE SAW SGTL MED 73X18.5 STR (BLADE) IMPLANT
BLADE SURG 21 STRL SS (BLADE) ×2 IMPLANT
BNDG COHESIVE 4X5 TAN STRL (GAUZE/BANDAGES/DRESSINGS) ×2 IMPLANT
BNDG GAUZE ELAST 4 BULKY (GAUZE/BANDAGES/DRESSINGS) ×2 IMPLANT
COVER SURGICAL LIGHT HANDLE (MISCELLANEOUS) ×4 IMPLANT
COVER WAND RF STERILE (DRAPES) ×2 IMPLANT
DRAPE U-SHAPE 47X51 STRL (DRAPES) ×4 IMPLANT
DRSG ADAPTIC 3X8 NADH LF (GAUZE/BANDAGES/DRESSINGS) ×2 IMPLANT
DRSG PAD ABDOMINAL 8X10 ST (GAUZE/BANDAGES/DRESSINGS) ×4 IMPLANT
DURAPREP 26ML APPLICATOR (WOUND CARE) ×2 IMPLANT
ELECT REM PT RETURN 9FT ADLT (ELECTROSURGICAL) ×2
ELECTRODE REM PT RTRN 9FT ADLT (ELECTROSURGICAL) ×1 IMPLANT
GAUZE SPONGE 4X4 12PLY STRL (GAUZE/BANDAGES/DRESSINGS) ×2 IMPLANT
GLOVE BIOGEL PI IND STRL 9 (GLOVE) ×1 IMPLANT
GLOVE BIOGEL PI INDICATOR 9 (GLOVE) ×1
GLOVE SURG ORTHO 9.0 STRL STRW (GLOVE) ×2 IMPLANT
GOWN STRL REUS W/ TWL XL LVL3 (GOWN DISPOSABLE) ×2 IMPLANT
GOWN STRL REUS W/TWL XL LVL3 (GOWN DISPOSABLE) ×2
KIT BASIN OR (CUSTOM PROCEDURE TRAY) ×2 IMPLANT
KIT TURNOVER KIT B (KITS) ×2 IMPLANT
NS IRRIG 1000ML POUR BTL (IV SOLUTION) ×2 IMPLANT
PACK ORTHO EXTREMITY (CUSTOM PROCEDURE TRAY) ×2 IMPLANT
PAD ABD 8X10 STRL (GAUZE/BANDAGES/DRESSINGS) ×1 IMPLANT
PAD ARMBOARD 7.5X6 YLW CONV (MISCELLANEOUS) ×4 IMPLANT
STOCKINETTE IMPERVIOUS LG (DRAPES) IMPLANT
SUT ETHILON 2 0 PSLX (SUTURE) ×3 IMPLANT
TOWEL GREEN STERILE (TOWEL DISPOSABLE) ×2 IMPLANT
TUBE CONNECTING 12X1/4 (SUCTIONS) ×2 IMPLANT
YANKAUER SUCT BULB TIP NO VENT (SUCTIONS) ×2 IMPLANT

## 2019-07-24 NOTE — Social Work (Signed)
CSW acknowledging consult for SNF placement. Will follow for therapy recommendations needed to best determine disposition/for insurance authorization.  FL2 completed, PASRR pending.    Westley Hummer, MSW, [facility] [phone]

## 2019-07-24 NOTE — Anesthesia Procedure Notes (Signed)
Procedure Name: MAC Date/Time: 07/24/2019 7:35 AM Performed by: Inda Coke, CRNA Pre-anesthesia Checklist: Patient identified, Emergency Drugs available, Suction available, Timeout performed and Patient being monitored Patient Re-evaluated:Patient Re-evaluated prior to induction Oxygen Delivery Method: Nasal cannula Induction Type: IV induction Ventilation: Nasal airway inserted- appropriate to patient size Dental Injury: Teeth and Oropharynx as per pre-operative assessment

## 2019-07-24 NOTE — Progress Notes (Signed)
PROGRESS NOTE    Xavier Turner  UUV:[id number] DOB: 09-11-1956 DOA: 07/21/2019 PCP: Harden Mo, MD   Brief Narrative:  HPI On 07/20/2001 by Dr. Fuller Plan Xavier Turner is a 63 y.o. male with medical history significant of diabetes mellitus type 2, hypertension, hyperlipidemia, OSA, and Sturge-Weber syndrome involving face.  He presents with complaints of redness of the left foot.  Patient had initially been seen in the emergency department on 10/6 with redness of his left leg.  He underwent Doppler ultrasound which was negative for DVT and was discharged home on Keflex.  He returned to the hospital on 10/12 and was admitted for cellulitis of the left leg failing outpatient therapy.  He was initially started on Rocephin and switched to cefdinir to complete a 7-day course following discharge home on 10/15.  He was discharged back to the group home and reported taking medication as prescribed with improvement in symptoms.  In talks with his sister over the phone who had seen his foot last week reports hat it looked a lot better at that time.  The patient stated that his foot has been wrapped, but sister noted that his foot was not wrapped and she saw it last week.  He completed antibiotics 2 days ago, and reports that since that time redness and swelling returned, but now in his foot.  Notes associated symptoms of clearish drainage from the wound of the aspect of his foot.  Denies having any significant fever, chills, nausea, vomiting, or diarrhea.  Patient does have a podiatrist in the outpatient setting who clips his toenails.  Interim history Patient mated with recurrent left foot cellulitis.  ID and orthopedis consulted. Currently on IV antibiotics. 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  Patient recently hospitalized and treated for left foot cellulitis and discharged with oral completion of antibiotics. -Left foot x-ray  shows focal ulceration on the lateral base of the fifth proximal phalanx joint with subcutaneous gas and a few punctate radiodensities which appear confined to the skin surface.  No osseous erosion to suggest osteomyelitis however MRI would be sensitive.  Circumferential swelling of the foot. -Question whether patient was having his foot to right at the group home given that he does have a diabetic ulcer of the lateral aspect of his left foot -ESR and CRP both elevated -Blood cultures show no growth to date -Erythema has improved mildly today -Infectious disease consulted and appreciated, transitioned patient to Unasyn from ceftriaxone for broader coverage.  Recommended podiatry for debridement and tissue culture.  May consider giving long-acting infusion of gram-positive coverage such as dalbavancin or oritavancinc prior to discharge. -Orthopedic surgery consulted and appreciated. S/p MRI showing osteomyelitis of the 5th metatarsal head. 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  -have added hydrocodone and morphine for pain control -will likely consult PT and OT on 10/31  Diabetes mellitus, type II  -Hemoglobin A1c 6.6 on 07/06/2019 -Hold Januvia and Metformin -Continue insulin sliding scale with CBG monitoring   Hyponatremia -Appears to be acute on chronic -Sodium on admission 131 -Sodium earlier this month 126- appears to have a baseline of approximately 130 -currently 135 -Continue to monitor BMP  Essential hypertension -Hydrochlorothiazide held due to hyponatremia -BP stable  Hyperlipidemia -Continue statin  BPH  -Continue Flomax  Intellectual delay -Patient resides at a group home  Sturge-Weber syndrome -Stable continue outpatient management  GERD -Continue PPI  DVT Prophylaxis  Lovenox  Code Status: Full  Family Communication: None at bedside  Disposition Plan: Admitted.  Disposition TBD.  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 07/23/19 2155 07/24/19 0922   07/22/19 1500  cefTRIAXone (ROCEPHIN) 1 g in sodium chloride 0.9 % 100 mL IVPB  Status:  Discontinued     1 g 200 mL/hr over 30 Minutes Intravenous Every 24 hours 07/21/19 1550 07/22/19 1100   07/22/19 1130  Ampicillin-Sulbactam (UNASYN) 3 g in sodium chloride 0.9 % 100 mL IVPB     3 g 200 mL/hr over 30 Minutes Intravenous Every 6 hours 07/22/19 1100     07/21/19 1500  cefTRIAXone (ROCEPHIN) injection 1 g  Status:  Discontinued     1 g Intramuscular  Once 07/21/19 1458 07/21/19 1458   07/21/19 1500  cefTRIAXone (ROCEPHIN) 1 g in sodium chloride 0.9 % 100 mL IVPB     1 g 200 mL/hr over 30 Minutes Intravenous  Once 07/21/19 1458 07/21/19 1619      Subjective:   Xavier Turner seen and examined today.  Patient seen after surgery. Complains of left foot pain and spasms. Currently feels hungry. Denies chest pain, shortness of breath, abdominal pain, N/V/D/C.  Objective:   Vitals:   07/24/19 1020 07/24/19 1130 07/24/19 1230 07/24/19 1608  BP: 121/72 122/75 110/76 117/67  Pulse: 74 80 78 83  Resp:      Temp:      TempSrc:      SpO2:      Weight:      Height:        Intake/Output Summary (Last 24 hours) at 07/24/2019 1810 Last data filed at 07/24/2019 1140 Gross per 24 hour  Intake 700 ml  Output 1152 ml  Net -452 ml   Filed Weights   07/21/19 1600  Weight: 93.5 kg   Exam  General: Well developed, well nourished, NAD, appears stated age  HEENT: NCAT, mucous membranes moist.  Sturge-Weber Mark on right side of the face  Cardiovascular: S1 S2 auscultated, RRR, no murmur  Respiratory: Clear to auscultation bilaterally with equal chest rise  Abdomen:  Soft, nontender, nondistended, + bowel sounds  Extremities: warm dry without cyanosis clubbing or edema of RLE.  Left foot with dressing in place  Neuro: AAOx3, history of intellectual delay however nonfocal  Psych: Pleasant, appropriate mood and affect  Data Reviewed: I have personally reviewed following labs and imaging studies  CBC: Recent Labs  Lab 07/21/19 1210 07/22/19 0505  WBC 8.8 6.7  NEUTROABS 6.6  --   HGB 13.2 12.5*  HCT 42.2 39.6  MCV 88.3 85.9  PLT 247 680   Basic Metabolic Panel: Recent Labs  Lab 07/21/19 1210 07/22/19 0505 07/23/19 1012 07/24/19 0605  NA 131* 131* 134* 135  K 4.0 3.5 3.7 3.9  CL 92* 94* 98 100  CO2 27 27 27 26   GLUCOSE 154* 106* 204* 108*  BUN 13 7* 8 8  CREATININE 0.54* 0.42* 0.47* 0.48*  CALCIUM 9.1 8.6* 8.0* 8.8*   GFR: Estimated Creatinine Clearance: 96.7 mL/min (A) (by C-G formula based on SCr of 0.48 mg/dL (L)). Liver Function Tests: No results for input(s): AST, ALT,  ALKPHOS, BILITOT, PROT, ALBUMIN in the last 168 hours. No results for input(s): LIPASE, AMYLASE in the last 168 hours. No results for input(s): AMMONIA in the last 168 hours. Coagulation Profile: No results for input(s): INR, PROTIME in the last 168 hours. Cardiac Enzymes: No results for input(s): CKTOTAL, CKMB, CKMBINDEX, TROPONINI in the last 168 hours. BNP (last 3 results) No results for input(s): PROBNP in the last 8760 hours. HbA1C: No results for input(s): HGBA1C in the last 72 hours. CBG: Recent Labs  Lab 07/24/19 0621 07/24/19 0830 07/24/19 1235 07/24/19 1250 07/24/19 1622  GLUCAP 112* 112* 252* 241* 132*   Lipid Profile: No results for input(s): CHOL, HDL, LDLCALC, TRIG, CHOLHDL, LDLDIRECT in the last 72 hours. Thyroid Function Tests: No results for input(s): TSH, T4TOTAL, FREET4, T3FREE, THYROIDAB in the last 72 hours. Anemia Panel: No results for input(s): VITAMINB12, FOLATE, FERRITIN, TIBC, IRON, RETICCTPCT in the last 72 hours. Urine  analysis:    Component Value Date/Time   COLORURINE YELLOW 07/08/2019 1247   APPEARANCEUR CLEAR 07/08/2019 1247   LABSPEC 1.020 07/08/2019 1247   PHURINE 6.0 07/08/2019 1247   GLUCOSEU NEGATIVE 07/08/2019 1247   HGBUR NEGATIVE 07/08/2019 1247   BILIRUBINUR NEGATIVE 07/08/2019 1247   KETONESUR 5 (A) 07/08/2019 1247   PROTEINUR NEGATIVE 07/08/2019 1247   NITRITE NEGATIVE 07/08/2019 1247   LEUKOCYTESUR TRACE (A) 07/08/2019 1247   Sepsis Labs: @LABRCNTIP (procalcitonin:4,lacticidven:4)  ) Recent Results (from the past 240 hour(s))  Blood culture (routine x 2)     Status: None (Preliminary result)   Collection Time: 07/21/19  1:50 PM   Specimen: BLOOD RIGHT WRIST  Result Value Ref Range Status   Specimen Description BLOOD RIGHT WRIST  Final   Special Requests   Final    BOTTLES DRAWN AEROBIC AND ANAEROBIC Blood Culture results may not be optimal due to an inadequate volume of blood received in culture bottles   Culture   Final    NO GROWTH 3 DAYS Performed at [facility], [place] [address], [place], [place] [address]    Report Status PENDING  Incomplete  Novel Coronavirus, NAA (Hosp order, Send-out to Ref Lab; TAT 18-24 hrs     Status: None   Collection Time: 07/21/19  2:06 PM   Specimen: Nasopharyngeal Swab; Respiratory  Result Value Ref Range Status   SARS-CoV-2, NAA NOT DETECTED NOT DETECTED Final    Comment: (NOTE) This nucleic acid amplification test was developed and its performance characteristics determined by [employer]. Nucleic acid amplification tests include PCR and TMA. This test has not been FDA cleared or approved. This test has been authorized by FDA under an Emergency Use Authorization (EUA). This test is only authorized for the duration of time the declaration that circumstances exist justifying the authorization of the emergency use of in vitro diagnostic tests for detection of SARS-CoV-2 virus and/or diagnosis of COVID-19 infection under  section 564(b)(1) of the Act, 21 U.S.C. 570VXB-9(T) (1), unless the authorization is terminated or revoked sooner. When diagnostic testing is negative, the possibility of a false negative result should be considered in the context of a patient's recent exposures and the presence of clinical signs and symptoms consistent with COVID-19. An individual without symptoms of COVID- 19 and who is not shedding SARS-CoV-2 vi rus would expect to have a negative (not detected) result in this assay. Performed At: [facility] [address] [place], [place] [id number] Rush Farmer MD AQ:[id number]    [place]  Final  Comment: Performed at [facility], [place] [address] [place], [place] [address]  Blood culture (routine x 2)     Status: None (Preliminary result)   Collection Time: 07/21/19  3:37 PM   Specimen: BLOOD RIGHT WRIST  Result Value Ref Range Status   Specimen Description BLOOD RIGHT WRIST  Final   Special Requests   Final    BOTTLES DRAWN AEROBIC ONLY Blood Culture results may not be optimal due to an inadequate volume of blood received in culture bottles   Culture   Final    NO GROWTH 3 DAYS Performed at [facility], [place] [address], [place], [place] [address]    Report Status PENDING  Incomplete  Surgical pcr screen     Status: None   Collection Time: 07/23/19 10:01 PM   Specimen: Nasal Mucosa; Nasal Swab  Result Value Ref Range Status   MRSA, PCR NEGATIVE NEGATIVE Final   Staphylococcus aureus NEGATIVE NEGATIVE Final    Comment: (NOTE) The Xpert SA Assay (FDA approved for NASAL specimens in patients 33 years of age and older), is one component of a comprehensive surveillance program. It is not intended to diagnose infection nor to guide or monitor treatment. Performed at [facility], [place] [address], [place], [place] [address]   SARS Coronavirus 2 by RT PCR (hospital order, performed in [facility] hospital lab)  Nasopharyngeal Nasopharyngeal Swab     Status: None   Collection Time: 07/24/19  4:46 AM   Specimen: Nasopharyngeal Swab  Result Value Ref Range Status   SARS Coronavirus 2 NEGATIVE NEGATIVE Final    Comment: (NOTE) If result is NEGATIVE SARS-CoV-2 target nucleic acids are NOT DETECTED. The SARS-CoV-2 RNA is generally detectable in upper and lower  respiratory specimens during the acute phase of infection. The lowest  concentration of SARS-CoV-2 viral copies this assay can detect is 250  copies / mL. A negative result does not preclude SARS-CoV-2 infection  and should not be used as the sole basis for treatment or other  patient management decisions.  A negative result may occur with  improper specimen collection / handling, submission of specimen other  than nasopharyngeal swab, presence of viral mutation(s) within the  areas targeted by this assay, and inadequate number of viral copies  (<250 copies / mL). A negative result must be combined with clinical  observations, patient history, and epidemiological information. If result is POSITIVE SARS-CoV-2 target nucleic acids are DETECTED. The SARS-CoV-2 RNA is generally detectable in upper and lower  respiratory specimens dur ing the acute phase of infection.  Positive  results are indicative of active infection with SARS-CoV-2.  Clinical  correlation with patient history and other diagnostic information is  necessary to determine patient infection status.  Positive results do  not rule out bacterial infection or co-infection with other viruses. If result is PRESUMPTIVE POSTIVE SARS-CoV-2 nucleic acids MAY BE PRESENT.   A presumptive positive result was obtained on the submitted specimen  and confirmed on repeat testing.  While 2019 novel coronavirus  (SARS-CoV-2) nucleic acids may be present in the submitted sample  additional confirmatory testing may be necessary for epidemiological  and / or clinical management purposes  to  differentiate between  SARS-CoV-2 and other Sarbecovirus currently known to infect humans.  If clinically indicated additional testing with an alternate test  methodology [phone]) is advised. The SARS-CoV-2 RNA is generally  detectable in upper and lower respiratory sp ecimens during the acute  phase of  infection. The expected result is Negative. Fact Sheet for Patients:  StrictlyIdeas.no Fact Sheet for Healthcare Providers: BankingDealers.co.za This test is not yet approved or cleared by the [place] FDA and has been authorized for detection and/or diagnosis of SARS-CoV-2 by FDA under an Emergency Use Authorization (EUA).  This EUA will remain in effect (meaning this test can be used) for the duration of the COVID-19 declaration under Section 564(b)(1) of the Act, 21 U.S.C. section 360bbb-3(b)(1), unless the authorization is terminated or revoked sooner. Performed at [facility], [place] [address], [place], [place] [address]       Radiology Studies: Mr Foot Left W Wo Contrast  Result Date: 07/23/2019 CLINICAL DATA:  Osteomyelitis, foot swelling, diabetic EXAM: MRI OF THE LEFT FOREFOOT WITHOUT AND WITH CONTRAST TECHNIQUE: Multiplanar, multisequence MR imaging of the left forefoot was performed both before and after administration of intravenous contrast. CONTRAST:  55m GADAVIST GADOBUTROL 1 MMOL/ML IV SOLN COMPARISON:  None. FINDINGS: Bones/Joint/Cartilage There is increased marrow signal seen throughout the fifth metatarsal head with subtle T1 hypointensity. There is a small joint effusion seen at the fifth MTP joint. No definite area of cortical destruction however is noted. 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 There is hallux valgus deformity of the forefoot. Ligaments The Lisfranc ligament and partially visualized collateral ligaments are intact. Muscles and Tendons There is mild fatty atrophy noted  within the muscles surrounding the forefoot. There is also diffusely increased signal seen throughout the muscles, likely due to the denervation atrophy. The flexor and extensor tendons appear to be intact. The plantar fascia is intact. Soft tissues Diffuse dorsal soft tissue edema seen. There is a area of superficial ulceration seen overlying the lateral aspect of the fifth MTP joint measuring approximately a 2 cm in length. Small subcutaneous emphysema seen within this region. There is a focal fluid collection measuring approximately 2 cm seen beneath this area which extends to the plantar surface of the fifth metatarsal head. IMPRESSION: 1. Area of superficial ulceration with subcutaneous emphysema and early abscess/sinus tract extending to the fifth metatarsal head. 2. There is findings likely consistent with acute osteomyelitis involving the fifth metatarsal head. 3. Small joint effusion at the fifth MTP joint. Electronically Signed   By: BPrudencio PairM.D.   On: 07/23/2019 14:07   Vas UKoreaABurnard BuntingWith/wo Tbi  Result Date: 07/23/2019 LOWER EXTREMITY DOPPLER STUDY Indications: Ulceration. High Risk Factors: Diabetes.  Limitations: Today's exam was limited due to patient pain tolerance. Comparison Study: No prior studies. Performing Technologist: CCarlos LeveringRvt  Examination Guidelines: A complete evaluation includes at minimum, Doppler waveform signals and systolic blood pressure reading at the level of bilateral brachial, anterior tibial, and posterior tibial arteries, when vessel segments are accessible. Bilateral testing is considered an integral part of a complete examination. Photoelectric Plethysmograph (PPG) waveforms and toe systolic pressure readings are included as required and additional duplex testing as needed. Limited examinations for reoccurring indications may be performed as noted.  ABI Findings: +---------+------------------+-----+---------+--------+  Right     Rt Pressure  (mmHg) Index Waveform  Comment   +---------+------------------+-----+---------+--------+  Brachial  118                      triphasic           +---------+------------------+-----+---------+--------+  PTA       130                1.10  triphasic           +---------+------------------+-----+---------+--------+  DP        135                1.14  triphasic           +---------+------------------+-----+---------+--------+  Great Toe 101                0.86                      +---------+------------------+-----+---------+--------+ +---------+------------------+-----+---------+-----------+  Left      Lt Pressure (mmHg) Index Waveform  Comment      +---------+------------------+-----+---------+-----------+  Brachial                                     Contracture  +---------+------------------+-----+---------+-----------+  PTA       131                1.11  triphasic              +---------+------------------+-----+---------+-----------+  DP        132                1.12  triphasic              +---------+------------------+-----+---------+-----------+  Great Toe 85                 0.72                         +---------+------------------+-----+---------+-----------+ +-------+-----------+-----------+------------+------------+  ABI/TBI Today's ABI Today's TBI Previous ABI Previous TBI  +-------+-----------+-----------+------------+------------+  Right   1.14        0.86                                   +-------+-----------+-----------+------------+------------+  Left    1.12        0.72                                   +-------+-----------+-----------+------------+------------+  Summary: Right: Resting right ankle-brachial index is within normal range. No evidence of significant right lower extremity arterial disease. The right toe-brachial index is normal. Left: Resting left ankle-brachial index is within normal range. No evidence of significant left lower extremity arterial disease. The left toe-brachial index is  normal. The blood pressure cuff was placed on the proximal calf for patient comfort.  *See table(s) above for measurements and observations.  Electronically signed by Servando Snare MD on 07/23/2019 at 4:26:04 PM.    Final      Scheduled Meds:  aspirin EC  81 mg Oral Daily   brimonidine  1 drop Both Eyes TID   docusate sodium  100 mg Oral BID   dorzolamide-timolol  1 drop Right Eye BID   enoxaparin (LOVENOX) injection  40 mg Subcutaneous Q24H   escitalopram  10 mg Oral Daily   gabapentin  100 mg Oral BID   insulin aspart  0-5 Units Subcutaneous QHS   insulin aspart  0-9 Units Subcutaneous TID WC   latanoprost  1 drop Both Eyes QHS   pantoprazole  40 mg Oral Daily   rosuvastatin  20 mg Oral Daily   sodium chloride flush  3 mL Intravenous Q12H   tamsulosin  0.4 mg Oral Daily  Continuous Infusions:  ampicillin-sulbactam (UNASYN) IV 3 g (07/24/19 1705)     LOS: 3 days   Time Spent in minutes   45 minutes (greater than 50% of time spent with patient face to face, as well as reviewing old records, calling consults, and formulating a plan)   Shanette Tamargo D.O. on 07/24/2019 at 6:10 PM  Between 7am to 7pm - Please see pager noted on [email or web address]  After 7pm go to [email or web address]  And look for the night coverage person covering for me after hours  [facility] Office  [phone]

## 2019-07-24 NOTE — Progress Notes (Signed)
[facility]ious [facility]    Date of Admission:  07/21/2019   Total days of antibiotics 4 amp/sub           Xavier Turner is a 63 y.o. 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 He was evaluated by dr duda and decision to undergo Left 5th ray amputation and debridement. Patient reports that he is starting to have pain to left foot with spasm.   Medications:  . aspirin EC  81 mg Oral Daily  . brimonidine  1 drop Both Eyes TID  . docusate sodium  100 mg Oral BID  . dorzolamide-timolol  1 drop Right Eye BID  . enoxaparin (LOVENOX) injection  40 mg Subcutaneous Q24H  . escitalopram  10 mg Oral Daily  . gabapentin  100 mg Oral BID  . insulin aspart  0-5 Units Subcutaneous QHS  . insulin aspart  0-9 Units Subcutaneous TID WC  . latanoprost  1 drop Both Eyes QHS  . pantoprazole  40 mg Oral Daily  . rosuvastatin  20 mg Oral Daily  . sodium chloride flush  3 mL Intravenous Q12H  . tamsulosin  0.4 mg Oral Daily    Objective: Vital signs in last 24 hours: Temp:  [97 F (36.1 C)-97.8 F (36.6 C)] 97.5 F (36.4 C) (10/30 0924) Pulse Rate:  [67-80] 78 (10/30 1230) Resp:  [12-19] 18 (10/30 0924) BP: (101-132)/(59-76) 110/76 (10/30 1230) SpO2:  [93 %-99 %] 96 % (10/30 0924) Physical Exam  Constitutional: He is oriented to person, place, and time. He appears well-developed and well-nourished. No  distress.  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 No oropharyngeal exudate.  Cardiovascular: Normal rate, regular rhythm and normal heart sounds. Exam reveals no gallop and no friction rub.  No murmur heard.  Pulmonary/Chest: Effort normal and breath sounds normal. No respiratory distress. He has no wheezes.  Ext: left foot is wrapped with 4 toes visible, moving  Skin: Skin is warm and dry. No rash noted. No erythema.  Psychiatric: He has a normal mood and affect. His behavior is normal.     Lab Results Recent Labs    07/22/19 0505 07/23/19 1012 07/24/19 0605  WBC 6.7  --   --   HGB 12.5*  --   --   HCT 39.6  --   --   NA 131* 134* 135  K 3.5 3.7 3.9  CL 94* 98 100  CO2 27 27 26   BUN 7* 8 8  CREATININE 0.42* 0.47* 0.48*   Sedimentation Rate Recent Labs    07/21/19 1859  ESRSEDRATE 28*   C-Reactive Protein Recent Labs    07/21/19 1859  CRP 2.3*    Microbiology: reviewed Studies/Results: Mr Foot Left W Wo Contrast  Result Date: 07/23/2019 CLINICAL DATA:  Osteomyelitis, foot swelling, diabetic EXAM: MRI OF THE LEFT FOREFOOT WITHOUT AND WITH CONTRAST TECHNIQUE: Multiplanar, multisequence  MR imaging of the left forefoot was performed both before and after administration of intravenous contrast. CONTRAST:  5mL GADAVIST GADOBUTROL 1 MMOL/ML IV SOLN COMPARISON:  None. FINDINGS: Bones/Joint/Cartilage There is increased marrow signal seen throughout the fifth metatarsal head with subtle T1 hypointensity. There is a small joint effusion seen at the fifth MTP joint. No definite area of cortical destruction however is noted. 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 There is hallux valgus deformity of the forefoot. Ligaments The Lisfranc ligament and partially visualized collateral ligaments are intact. Muscles and Tendons There is mild fatty atrophy noted within the muscles surrounding the forefoot. There is also  diffusely increased signal seen throughout the muscles, likely due to the denervation atrophy. The flexor and extensor tendons appear to be intact. The plantar fascia is intact. Soft tissues Diffuse dorsal soft tissue edema seen. There is a area of superficial ulceration seen overlying the lateral aspect of the fifth MTP joint measuring approximately a 2 cm in length. Small subcutaneous emphysema seen within this region. There is a focal fluid collection measuring approximately 2 cm seen beneath this area which extends to the plantar surface of the fifth metatarsal head. IMPRESSION: 1. Area of superficial ulceration with subcutaneous emphysema and early abscess/sinus tract extending to the fifth metatarsal head. 2. There is findings likely consistent with acute osteomyelitis involving the fifth metatarsal head. 3. Small joint effusion at the fifth MTP joint. Electronically Signed   By: Prudencio Pair M.D.   On: 07/23/2019 14:07   Vas [place] Burnard Bunting With/wo Tbi  Result Date: 07/23/2019 LOWER EXTREMITY DOPPLER STUDY Indications: Ulceration. High Risk Factors: Diabetes.  Limitations: Today's exam was limited due to patient pain tolerance. Comparison Study: No prior studies. Performing Technologist: Carlos Levering Rvt  Examination Guidelines: A complete evaluation includes at minimum, Doppler waveform signals and systolic blood pressure reading at the level of bilateral brachial, anterior tibial, and posterior tibial arteries, when vessel segments are accessible. Bilateral testing is considered an integral part of a complete examination. Photoelectric Plethysmograph (PPG) waveforms and toe systolic pressure readings are included as required and additional duplex testing as needed. Limited examinations for reoccurring indications may be performed as noted.  ABI Findings: +---------+------------------+-----+---------+--------+ Right    Rt Pressure (mmHg)IndexWaveform Comment   +---------+------------------+-----+---------+--------+ Brachial 118                    triphasic         +---------+------------------+-----+---------+--------+ PTA      130               1.10 triphasic         +---------+------------------+-----+---------+--------+ DP       135               1.14 triphasic         +---------+------------------+-----+---------+--------+ Great Toe101               0.86                   +---------+------------------+-----+---------+--------+ +---------+------------------+-----+---------+-----------+ Left     Lt Pressure (mmHg)IndexWaveform Comment     +---------+------------------+-----+---------+-----------+ Brachial                                 Contracture +---------+------------------+-----+---------+-----------+ PTA      131               1.11 triphasic            +---------+------------------+-----+---------+-----------+  DP       132               1.12 triphasic            +---------+------------------+-----+---------+-----------+ Great Toe85                0.72                      +---------+------------------+-----+---------+-----------+ +-------+-----------+-----------+------------+------------+ ABI/TBIToday's ABIToday's TBIPrevious ABIPrevious TBI +-------+-----------+-----------+------------+------------+ Right  1.14       0.86                                +-------+-----------+-----------+------------+------------+ Left   1.12       0.72                                +-------+-----------+-----------+------------+------------+  Summary: Right: Resting right ankle-brachial index is within normal range. No evidence of significant right lower extremity arterial disease. The right toe-brachial index is normal. Left: Resting left ankle-brachial index is within normal range. No evidence of significant left lower extremity arterial disease. The left toe-brachial index is normal. The blood pressure cuff  was placed on the proximal calf for patient comfort.  *See table(s) above for measurements and observations.  Electronically signed by Servando Snare MD on 07/23/2019 at 4:26:04 PM.    Final      Assessment/Plan: 5th toe/MTH osteo with recurrent cellulitis s/p 5th ray amputation = continue with amp/sub while hospitalized. He should continue with abtx for the next 3 days then transition to orals for addn 5 days with amox/clav 875mg  BID to finish course for secondary cellulitis.  Will sign off  [facility] Cell: [phone] Pager: [phone]  07/24/2019, 3:42 PM

## 2019-07-24 NOTE — Transfer of Care (Signed)
Immediate Anesthesia Transfer of Care Note  Patient: Xavier Turner  Procedure(s) Performed: AMPUTATION RAY, fifth left (Left Fifth Toe)  Patient Location: PACU  Anesthesia Type:MAC combined with regional for post-op pain  Level of Consciousness: awake, alert , oriented and patient cooperative  Airway & Oxygen Therapy: Patient Spontanous Breathing and Patient connected to nasal cannula oxygen  Post-op Assessment: Report given to RN and Post -op Vital signs reviewed and stable  Post vital signs: Reviewed and stable  Last Vitals:  Vitals Value Taken Time  BP 101/61 07/24/19 0809  Temp    Pulse 77 07/24/19 0810  Resp 13 07/24/19 0810  SpO2 96 % 07/24/19 0810  Vitals shown include unvalidated device data.  Last Pain:  Vitals:   07/24/19 0610  TempSrc: Oral  PainSc:          Complications: No apparent anesthesia complications

## 2019-07-24 NOTE — Anesthesia Postprocedure Evaluation (Signed)
Anesthesia Post Note  Patient: TRAPPER MEECH  Procedure(s) Performed: AMPUTATION RAY, fifth left (Left Fifth Toe)     Patient location during evaluation: PACU Anesthesia Type: MAC Level of consciousness: awake and alert Pain management: pain level controlled Vital Signs Assessment: post-procedure vital signs reviewed and stable Respiratory status: spontaneous breathing, nonlabored ventilation and respiratory function stable Cardiovascular status: blood pressure returned to baseline and stable Postop Assessment: no apparent nausea or vomiting Anesthetic complications: no    Last Vitals:  Vitals:   07/24/19 0830 07/24/19 0839  BP:  113/69  Pulse: 70 70  Resp: 14 14  Temp:    SpO2: 99% 99%    Last Pain:  Vitals:   07/24/19 0830  TempSrc:   PainSc: 0-No pain                 Audry Pili

## 2019-07-24 NOTE — Progress Notes (Signed)
Orthopedic Tech Progress Note Patient Details:  Xavier Turner April 08, 1956 [id number]  Ortho Devices Type of Ortho Device: Postop shoe/boot Ortho Device/Splint Location: LLE Ortho Device/Splint Interventions: Adjustment, Ordered, Application   Post Interventions Patient Tolerated: Well Instructions Provided: Adjustment of device, Care of device   Janit Pagan 07/24/2019, 10:26 AM

## 2019-07-24 NOTE — Op Note (Addendum)
07/24/2019  8:16 AM  PATIENT:  Xavier Turner    PRE-OPERATIVE DIAGNOSIS:  osteomyelitis, left  foot fifth ray With ulceration beneath the fifth metatarsal head.  POST-OPERATIVE DIAGNOSIS:  Same  PROCEDURE:  AMPUTATION RAY, fifth left Local tissue rearrangement for wound closure 8 x 4 cm.  SURGEON:  Newt Minion, MD  PHYSICIAN ASSISTANT:None ANESTHESIA:   General  PREOPERATIVE INDICATIONS:  Xavier Turner is a  63 y.o. male with a diagnosis of osteomyelitis, left  foot fifth ray who failed conservative measures and elected for surgical management.    The risks benefits and alternatives were discussed with the patient preoperatively including but not limited to the risks of infection, bleeding, nerve injury, cardiopulmonary complications, the need for revision surgery, among others, and the patient was willing to proceed.  OPERATIVE IMPLANTS: None  _0 @  OPERATIVE FINDINGS: Abscess around the MTP joint the margins were clear  OPERATIVE PROCEDURE: Patient was brought the operating room and underwent a MAC anesthetic.  The left lower extremity was then prepped using DuraPrep draped into a sterile field a timeout was called.  Patient received a local block with 30 cc of quarter percent Marcaine plain.  A elliptical incision was made along the fifth ray lateral border to encompass the ulcerative tissue and the fifth ray.  The fifth ray was resected through the base.  The tissue margins were clear however there was an abscess at the MTP joint.  Further tissue margins were trimmed back to provide a better soft tissue margin.  Wound was irrigated with normal saline electrocautery was used for hemostasis.  There is no signs of infection along the wound edges.  Local tissue rearrangement was used to close the wound and 8 x 4 cm with 2-0 nylon.  Sterile dressing was applied patient was taken the PACU in stable condition.   DISCHARGE PLANNING:  Antibiotic duration: Continue antibiotics  for 3 days postoperatively.  Patient has cellulitis throughout the midfoot and forefoot.  Weightbearing: Nonweightbearing on the left  Pain medication: Opioid pathway ordered  Dressing care/ Wound VAC: Dry dressing change as needed  Ambulatory devices: Walker  Discharge to: Anticipate discharge to SNF  Follow-up: In the office 1 week post operative.

## 2019-07-24 NOTE — Progress Notes (Signed)
Called ortho tech to order post op shoe

## 2019-07-24 NOTE — Interval H&P Note (Signed)
History and Physical Interval Note:  07/24/2019 6:56 AM  Xavier Turner  has presented today for surgery, with the diagnosis of osteomyelitis, left  foot fifth ray.  The various methods of treatment have been discussed with the patient and family. After consideration of risks, benefits and other options for treatment, the patient has consented to  Procedure(s): AMPUTATION RAY, fifth left (Left) as a surgical intervention.  The patient's history has been reviewed, patient examined, no change in status, stable for surgery.  I have reviewed the patient's chart and labs.  Questions were answered to the patient's satisfaction.     Bevely Palmer Camauri Fleece

## 2019-07-24 NOTE — TOC Progression Note (Signed)
Transition of Care [facility]) - Progression Note    Patient Details  Name: Xavier Turner MRN: [id number] Date of Birth: [birth year]  Transition of [facility]) CM/SW [place], [place] Phone Number: 07/24/2019, 3:51 PM  Clinical Narrative:    CSW spoke with pt and pt sister [name] at bedside. We discussed next steps and SNF referral process. Pt family familiar with SNF from pt mothers placement at [facility]. CMS list provided. CSW number also provided. CSW will f/u with family preferred SNFs [facility].    Expected Discharge Plan: [place] Barriers to Discharge: Continued Medical Work up  Expected Discharge Plan and Services Expected Discharge Plan: [place] In-house Referral: Clinical Social Work Discharge Planning Services: CM Consult Post Acute Care Choice: [place] Living arrangements for the past 2 months: Group Home   Social Determinants of Health (SDOH) Interventions    Readmission Risk Interventions Readmission Risk Prevention Plan 07/22/2019  Post Dischage Appt Not Complete  Appt Comments further work up SNF vs return to group home  Medication Screening Complete  Transportation Screening Complete  Some recent data might be hidden

## 2019-07-25 ENCOUNTER — Encounter (HOSPITAL_COMMUNITY): Payer: Self-pay | Admitting: Orthopedic Surgery

## 2019-07-25 DIAGNOSIS — K219 Gastro-esophageal reflux disease without esophagitis: Secondary | ICD-10-CM | POA: Diagnosis not present

## 2019-07-25 DIAGNOSIS — N400 Enlarged prostate without lower urinary tract symptoms: Secondary | ICD-10-CM | POA: Diagnosis not present

## 2019-07-25 DIAGNOSIS — L03119 Cellulitis of unspecified part of limb: Secondary | ICD-10-CM | POA: Diagnosis not present

## 2019-07-25 DIAGNOSIS — E871 Hypo-osmolality and hyponatremia: Secondary | ICD-10-CM | POA: Diagnosis not present

## 2019-07-25 LAB — BASIC METABOLIC PANEL
Anion gap: 14 (ref 5–15)
BUN: 7 mg/dL — ABNORMAL LOW (ref 8–23)
CO2: 25 mmol/L (ref 22–32)
Calcium: 8.7 mg/dL — ABNORMAL LOW (ref 8.9–10.3)
Chloride: 94 mmol/L — ABNORMAL LOW (ref 98–111)
Creatinine, Ser: 0.6 mg/dL — ABNORMAL LOW (ref 0.61–1.24)
GFR calc Af Amer: >60 mL/min (ref >60)
GFR calc non Af Amer: >60 mL/min (ref >60)
Glucose, Bld: 132 mg/dL — ABNORMAL HIGH (ref 70–99)
Potassium: 3.9 mmol/L (ref 3.5–5.1)
Sodium: 133 mmol/L — ABNORMAL LOW (ref 135–145)

## 2019-07-25 LAB — CBC
HCT: 42.7 % (ref 39.0–52.0)
Hemoglobin: 13.5 g/dL (ref 13.0–17.0)
MCH: 27.6 pg (ref 26.0–34.0)
MCHC: 31.6 g/dL (ref 30.0–36.0)
MCV: 87.3 fL (ref 80.0–100.0)
Platelets: 252 10*3/uL (ref 150–400)
RBC: 4.89 MIL/uL (ref 4.22–5.81)
RDW: 14.1 % (ref 11.5–15.5)
WBC: 10.1 10*3/uL (ref 4.0–10.5)
nRBC: 0 % (ref 0.0–0.2)

## 2019-07-25 LAB — GLUCOSE, CAPILLARY
Glucose-Capillary: 132 mg/dL — ABNORMAL HIGH (ref 70–99)
Glucose-Capillary: 159 mg/dL — ABNORMAL HIGH (ref 70–99)
Glucose-Capillary: 198 mg/dL — ABNORMAL HIGH (ref 70–99)
Glucose-Capillary: 167 mg/dL — ABNORMAL HIGH (ref 70–99)

## 2019-07-25 NOTE — Progress Notes (Signed)
Occupational Therapy Evaluation Patient Details Name: Xavier Turner MRN: [id number] DOB: 1956-09-21 Today's Date: 07/25/2019    History of Present Illness Xavier Turner is a 63 y.o. 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 5th ray amputation 07/24/2019.   Clinical Impression   PTA, pt lived in a group home and required increased assistance with mobility and ADL since last hospital admission. Pt currently mod A +2 with functional mobility and Mod to Max A with LB ADL. Pt has difficulty maintaining WBS L foot and increased pain. Pt will need to mobilize primarily at wc level to facilitate healing. Pt will benefit from rehab at SNF to facilitate return to PLOF. Will follow acutely.     Follow Up Recommendations  Supervision/Assistance - 24 hour;SNF    Equipment Recommendations  None recommended by OT    Recommendations for Other Services       Precautions / Restrictions Precautions Precautions: Fall Precaution Comments: L UE hemiplegia (congenital) Required Braces or Orthoses: Other Brace Other Brace: L post op shoe Restrictions Weight Bearing Restrictions: Yes LLE Weight Bearing: Non weight bearing Other Position/Activity Restrictions: weight bearing through heel only      Mobility Bed Mobility Overal bed mobility: Needs Assistance Bed Mobility: Supine to Sit     Supine to sit: Mod assist     General bed mobility comments: mod assist to raise trunk to sitting position and to balance on side of bed.  Transfers Overall transfer level: Needs assistance Equipment used: None Transfers: Stand Pivot Transfers;Sit to/from Stand Sit to Stand: Mod assist;+2 physical assistance Stand pivot transfers: +2 physical assistance;Mod assist       General transfer comment: able to stand on right LE,  difficulty pivoting and difficulty coming to stand    Balance Overall balance assessment: Needs assistance Sitting-balance support: Feet supported;Single extremity supported Sitting balance-Leahy Scale: Poor     Standing balance support: Bilateral upper extremity supported;During functional activity Standing balance-Leahy Scale: Poor Standing balance comment: decreased ability in standing due to NWB on left. Unable to use Left UE to assist much                           ADL either performed or assessed with clinical judgement   ADL Overall ADL's : Needs assistance/impaired Eating/Feeding: Set up;Sitting   Grooming: Minimal assistance;Sitting   Upper Body Bathing: Minimal assistance;Sitting   Lower Body Bathing: Sitting/lateral leans;Sit to/from stand;Maximal assistance   Upper Body Dressing : Moderate assistance;Sitting   Lower Body Dressing: Maximal assistance;Sit to/from stand;Sitting/lateral leans   Toilet Transfer: Moderate assistance;+2 for physical assistance;Stand-pivot   Toileting- Clothing Manipulation and Hygiene: Moderate assistance       Functional mobility during ADLs: Moderate assistance;+2 for physical assistance       Vision   Additional Comments: visual deficits at baseline     Perception     Praxis      Pertinent Vitals/Pain Pain Assessment: Faces Faces Pain Scale: Hurts whole lot Pain Location: L foot Pain Descriptors / Indicators: Discomfort;Sore;Operative site guarding Pain Intervention(s): Limited activity within patient's tolerance;Monitored during session;Repositioned     Hand Dominance Right   Extremity/Trunk Assessment Upper Extremity Assessment Upper Extremity Assessment: Defer to OT evaluation LUE Deficits / Details: uses as gross assist; holds toothbrush, stabilized on counter. Able  to lift arm a little for adls; hemiplegia   Lower Extremity Assessment Lower Extremity Assessment: Generalized weakness LLE Deficits /  Details: Ankle NT due to pain, hip/knee WFL LLE: Unable to fully assess due to pain   Cervical / Trunk Assessment Cervical / Trunk Assessment: Other exceptions(lateral bias at baseline)   Communication Communication Communication: No difficulties   Cognition Arousal/Alertness: Awake/alert;Lethargic Behavior During Therapy: WFL for tasks assessed/performed Overall Cognitive Status: Within Functional Limits for tasks assessed                                 General Comments: patient lethargic initially   General Comments       Exercises     Shoulder Instructions      Home Living Family/patient expects to be discharged to:: Group home Living Arrangements: Group Home   Type of Home: House Home Access: Level entry           Bathroom Shower/Tub: Occupational psychologist: Standard Bathroom Accessibility: Yes How Accessible: Accessible via walker Home Equipment: Wheelchair - manual   Additional Comments: has help to shower; he can call if he needs other assistance, reports he has been using a wheel chair recently      Prior Functioning/Environment Level of Independence: Needs assistance  Gait / Transfers Assistance Needed: has not been ambulatory since left LE cellulitis ADL's / Homemaking Assistance Needed: A for showering;meals and medication assistance            OT Problem List: Decreased strength;Decreased range of motion;Impaired balance (sitting and/or standing);Decreased safety awareness;Decreased knowledge of use of DME or AE;Decreased knowledge of precautions;Obesity;Pain;Impaired UE functional use      OT Treatment/Interventions: Self-care/ADL training;Therapeutic exercise;DME and/or AE instruction;Therapeutic activities;Patient/family education;Balance training    OT Goals(Current goals can be found in the care plan section) Acute Rehab OT Goals Patient Stated Goal: for L foot not to hurt OT Goal Formulation: With patient Time  For Goal Achievement: 08/08/19 Potential to Achieve Goals: Good  OT Frequency: Min 2X/week   Barriers to D/C:            Co-evaluation              AM-PAC OT "6 Clicks" Daily Activity     Outcome Measure Help from another person eating meals?: A Little Help from another person taking care of personal grooming?: A Little Help from another person toileting, which includes using toliet, bedpan, or urinal?: A Lot Help from another person bathing (including washing, rinsing, drying)?: A Lot Help from another person to put on and taking off regular upper body clothing?: A Little Help from another person to put on and taking off regular lower body clothing?: A Lot 6 Click Score: 15   End of Session Equipment Utilized During Treatment: Gait belt Nurse Communication: Mobility status;Weight bearing status  Activity Tolerance: Patient tolerated treatment well Patient left: in chair;with call bell/phone within reach  OT Visit Diagnosis: Unsteadiness on feet (R26.81);Muscle weakness (generalized) (M62.81);Pain Pain - Right/Left: Left Pain - part of body: Ankle and joints of foot                Time: [id number] OT Time Calculation (min): 25 min Charges:  OT General Charges $OT Visit: 1 Visit OT Evaluation $OT Eval Moderate Complexity: [place], OT/L   Acute OT Clinical Specialist Acute Rehabilitation Services Pager [phone] Office [phone]   [facility] 07/25/2019,  11:49 AM

## 2019-07-25 NOTE — Evaluation (Signed)
Physical Therapy Evaluation Patient Details Name: Xavier Turner MRN: [id number] DOB: 08-29-1956 Today's Date: 07/25/2019   History of Present Illness  Xavier Turner is a 63 y.o. 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  Clinical Impression  Patient very sleepy upon entering room, agrees to PT/OT. Able to rouse after washing face, turning on light. Patient requires mod assist to raise trunk to come to sitting on edge of bed. Assistance needed to maintain sitting balance. Patient able to perform sit to stand with mod +2 assist standing only on right LE. Mod +2 assist needed to pivot to recliner. Patient will continue to benefit from skilled PT to address his weakness and difficulty with mobility.     Follow Up Recommendations SNF;Supervision/Assistance - 24 hour    Equipment Recommendations  None recommended by PT    Recommendations for Other Services       Precautions / Restrictions Precautions Precautions: Fall Precaution Comments: L UE hemiplegia (congenital) Required Braces or Orthoses: Other Brace Other Brace: L post op shoe Restrictions Weight Bearing Restrictions: Yes LLE Weight Bearing: Non weight bearing Other Position/Activity Restrictions: weight bearing through heel only      Mobility  Bed Mobility Overal bed mobility: Needs Assistance Bed Mobility: Supine to Sit     Supine to sit: Mod assist     General bed mobility comments: mod assist to raise trunk to sitting position and to balance on side of bed.  Transfers Overall transfer level: Needs assistance Equipment used: None Transfers: Stand Pivot Transfers;Sit to/from Stand Sit to Stand: Mod assist;+2 physical assistance Stand pivot transfers: +2 physical assistance;Mod assist       General transfer comment: able to stand on  right LE, difficulty pivoting and difficulty coming to stand  Ambulation/Gait             General Gait Details: unable at this time due to NWB, pain  Stairs            Wheelchair Mobility    Modified Rankin (Stroke Patients Only)       Balance Overall balance assessment: Needs assistance Sitting-balance support: Feet supported;Single extremity supported Sitting balance-Leahy Scale: Poor     Standing balance support: Bilateral upper extremity supported;During functional activity Standing balance-Leahy Scale: Poor Standing balance comment: decreased ability in standing due to NWB on left. Unable to use Left UE to assist much                             Pertinent Vitals/Pain Pain Assessment: Faces Faces Pain Scale: Hurts whole lot Pain Location: L foot Pain Descriptors / Indicators: Discomfort;Sore;Operative site guarding Pain Intervention(s): Limited activity within patient's tolerance;Monitored during session;Repositioned    Home Living Family/patient expects to be discharged to:: Group home Living Arrangements: Group Home   Type of Home: House Home Access: Level entry       Home Equipment: Wheelchair - manual Additional Comments: has help to shower; he can call if he needs other assistance, reports he has been using a wheel chair recently    Prior Function Level of Independence: Needs assistance   Gait / Transfers Assistance Needed: has not been ambulatory since left LE cellulitis  ADL's / Homemaking Assistance Needed: A for showering;meals and medication assistance  Hand Dominance   Dominant Hand: Right    Extremity/Trunk Assessment   Upper Extremity Assessment Upper Extremity Assessment: Defer to OT evaluation LUE Deficits / Details: uses as gross assist; holds toothbrush, stabilized on counter. Able to lift arm a little for adls; hemiplegia    Lower Extremity Assessment Lower Extremity Assessment: Generalized  weakness LLE Deficits / Details: Ankle NT due to pain, hip/knee WFL LLE: Unable to fully assess due to pain    Cervical / Trunk Assessment Cervical / Trunk Assessment: Other exceptions(lateral bias at baseline)  Communication   Communication: No difficulties  Cognition Arousal/Alertness: Awake/alert;Lethargic Behavior During Therapy: WFL for tasks assessed/performed Overall Cognitive Status: Within Functional Limits for tasks assessed                                 General Comments: patient lethargic initially      General Comments      Exercises     Assessment/Plan    PT Assessment Patient needs continued PT services  PT Problem List Decreased strength;Decreased mobility;Decreased activity tolerance;Decreased balance;Pain       PT Treatment Interventions DME instruction;Therapeutic activities;Gait training;Therapeutic exercise;Functional mobility training;Balance training;Patient/family education;Wheelchair mobility training    PT Goals (Current goals can be found in the Care Plan section)  Acute Rehab PT Goals Patient Stated Goal: for L foot not to hurt PT Goal Formulation: With patient Time For Goal Achievement: 08/07/19 Potential to Achieve Goals: Good    Frequency Min 3X/week   Barriers to discharge Decreased caregiver support      Co-evaluation PT/OT/SLP Co-Evaluation/Treatment: Yes Reason for Co-Treatment: For patient/therapist safety;To address functional/ADL transfers PT goals addressed during session: Mobility/safety with mobility;Balance         AM-PAC PT "6 Clicks" Mobility  Outcome Measure Help needed turning from your back to your side while in a flat bed without using bedrails?: A Little Help needed moving from lying on your back to sitting on the side of a flat bed without using bedrails?: A Lot Help needed moving to and from a bed to a chair (including a wheelchair)?: A Lot Help needed standing up from a chair using your arms  (e.g., wheelchair or bedside chair)?: A Lot Help needed to walk in hospital room?: Total Help needed climbing 3-5 steps with a railing? : Total 6 Click Score: 11    End of Session Equipment Utilized During Treatment: Gait belt Activity Tolerance: Patient tolerated treatment well;Patient limited by pain Patient left: in chair;with call bell/phone within reach(no chair alarms available on unit. RN aware patient in recliner) Nurse Communication: Mobility status;Weight bearing status PT Visit Diagnosis: Unsteadiness on feet (R26.81);Muscle weakness (generalized) (M62.81);Difficulty in walking, not elsewhere classified (R26.2);Pain;Other abnormalities of gait and mobility (R26.89);History of falling (Z91.81) Pain - Right/Left: Left Pain - part of body: Ankle and joints of foot    Time: [id number] PT Time Calculation (min) (ACUTE ONLY): 25 min   Charges:   PT Evaluation $PT Eval Moderate Complexity: 1 Mod PT Treatments $Therapeutic Activity: 8-22 mins        Jamont Mellin, PT, GCS 07/25/19,11:54 AM

## 2019-07-25 NOTE — TOC Progression Note (Signed)
Transition of Care [facility]) - Progression Note    Patient Details  Name: CALIEB MAVROS MRN: [id number] Date of Birth: September 17, 1956  Transition [facility]) CM/SW [place], [place] Phone Number: 07/25/2019, 12:57 PM  Clinical Narrative:    CSW provided pt sister [name] with list of offers for SNF. Pt preferences were for [facility] and [facility]. [facility] has a bed and can admit pt on Monday. Pt will need a new COVID prior to dc. CSW has left hand off for [facility] team to f/u with sister and confirm preference.    Expected Discharge Plan: [place] Barriers to Discharge: Continued Medical Work up  Expected Discharge Plan and Services Expected Discharge Plan: [place] In-house Referral: Clinical Social Work Discharge Planning Services: CM Consult Post Acute Care Choice: [place] Living arrangements for the past 2 months: Group Home   Social Determinants of Health (SDOH) Interventions    Readmission Risk Interventions Readmission Risk Prevention Plan 07/22/2019  Post Dischage Appt Not Complete  Appt Comments further work up SNF vs return to group home  Medication Screening Complete  Transportation Screening Complete  Some recent data might be hidden

## 2019-07-25 NOTE — Progress Notes (Signed)
Patient ID: Xavier Turner, male   DOB: 1956/05/17, 63 y.o.   MRN: [id number] Post op day1 left fifth ray amputation, ideally non weight bearing left foot, however weight bearing as needed on left heel is ok, plan for d/c to SNF

## 2019-07-25 NOTE — Progress Notes (Signed)
PROGRESS NOTE    Xavier Turner  FIE:[id number] DOB: [birth year] DOA: 07/21/2019 PCP: Harden Mo, MD   Brief Narrative:  HPI On 07/20/2001 by Dr. Fuller Plan Xavier Turner is a 63 y.o. male with medical history significant of diabetes mellitus type 2, hypertension, hyperlipidemia, OSA, and Sturge-Weber syndrome involving face.  He presents with complaints of redness of the left foot.  Patient had initially been seen in the emergency department on 10/6 with redness of his left leg.  He underwent Doppler ultrasound which was negative for DVT and was discharged home on Keflex.  He returned to the hospital on 10/12 and was admitted for cellulitis of the left leg failing outpatient therapy.  He was initially started on Rocephin and switched to cefdinir to complete a 7-day course following discharge home on 10/15.  He was discharged back to the group home and reported taking medication as prescribed with improvement in symptoms.  In talks with his sister over the phone who had seen his foot last week reports hat it looked a lot better at that time.  The patient stated that his foot has been wrapped, but sister noted that his foot was not wrapped and she saw it last week.  He completed antibiotics 2 days ago, and reports that since that time redness and swelling returned, but now in his foot.  Notes associated symptoms of clearish drainage from the wound of the aspect of his foot.  Denies having any significant fever, chills, nausea, vomiting, or diarrhea.  Patient does have a podiatrist in the outpatient setting who clips his toenails.  Interim history Patient mated with recurrent left foot cellulitis.  ID and orthopedis consulted. Currently on IV antibiotics. 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  Patient recently hospitalized and treated for left foot cellulitis and discharged with oral completion of antibiotics. -Left foot x-ray  shows focal ulceration on the lateral base of the fifth proximal phalanx joint with subcutaneous gas and a few punctate radiodensities which appear confined to the skin surface.  No osseous erosion to suggest osteomyelitis however MRI would be sensitive.  Circumferential swelling of the foot. -Question whether patient was having his foot to right at the group home given that he does have a diabetic ulcer of the lateral aspect of his left foot -ESR and CRP both elevated -Blood cultures show no growth to date -Erythema has improved mildly today -Infectious disease consulted and appreciated, recommended 3 additional days of IV antibiotics, followed by 5 days of Augmentin -Orthopedic surgery consulted and appreciated. S/p MRI showing osteomyelitis of the 5th metatarsal head. 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  -Continue hydrocodone and morphine for pain control -Pending PT and OT consults  Diabetes mellitus, type II  -Hemoglobin A1c 6.6 on 07/06/2019 -Hold Januvia and Metformin -Continue insulin sliding scale with CBG monitoring   Hyponatremia -Appears to be acute on chronic -Sodium on admission 131 -Sodium earlier this month 126- appears to have a baseline of approximately 130 -currently 133 -Continue to monitor BMP  Essential hypertension -Hydrochlorothiazide held due to hyponatremia -BP stable  Hyperlipidemia -Continue statin  BPH  -Continue Flomax  Intellectual delay -Patient resides at a group home  Sturge-Weber syndrome -Stable continue outpatient management  GERD -Continue PPI  DVT Prophylaxis  Lovenox  Code Status: Full  Family Communication: None at bedside  Disposition Plan: Admitted.  Disposition TBD.  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 07/23/19 2155 07/24/19 0922   07/22/19 1500  cefTRIAXone (ROCEPHIN) 1 g in sodium chloride 0.9 % 100 mL IVPB  Status:  Discontinued     1 g 200 mL/hr over 30 Minutes Intravenous Every 24 hours 07/21/19 1550 07/22/19 1100   07/22/19 1130  Ampicillin-Sulbactam (UNASYN) 3 g in sodium chloride 0.9 % 100 mL IVPB     3 g 200 mL/hr over 30 Minutes Intravenous Every 6 hours 07/22/19 1100     07/21/19 1500  cefTRIAXone (ROCEPHIN) injection 1 g  Status:  Discontinued     1 g Intramuscular  Once 07/21/19 1458 07/21/19 1458   07/21/19 1500  cefTRIAXone (ROCEPHIN) 1 g in sodium chloride 0.9 % 100 mL IVPB     1 g 200 mL/hr over 30 Minutes Intravenous  Once 07/21/19 1458 07/21/19 1619      Subjective:   Xavier Turner seen and examined today.  Patient still complains of left foot pain but feels the pain medications help. Denies current chest pain, shortness of breath, abdominal pain, N/V/D/C.   Objective:   Vitals:   07/24/19 1608 07/24/19 1819 07/24/19 2310 07/25/19 0558  BP: 117/67 (!) 129/54 133/85 (!) 142/93  Pulse: 83 94 82 78  Resp:  18 16 17   Temp:  98.1 F (36.7 C) 97.8 F (36.6 C) 98 F (36.7 C)  TempSrc:  Oral Oral Oral  SpO2:  95% 95% 94%  Weight:      Height:        Intake/Output Summary (Last 24 hours) at 07/25/2019 1505 Last data filed at 07/25/2019 6979 Gross per 24 hour  Intake 1260 ml  Output 450 ml  Net 810 ml   Filed Weights   07/21/19 1600  Weight: 93.5 kg    Exam  General: Well developed, well nourished, NAD, appears stated age  HEENT: NCAT, mucous membranes moist. Sturge-Weber mark on R side of face  Cardiovascular: S1 S2 auscultated, RRR, no murmur  Respiratory: Clear to auscultation bilaterally with equal chest rise  Abdomen: Soft, nontender, nondistended, + bowel sounds  Extremities: warm dry without cyanosis clubbing or edema of RLE. Left foot  with dressing in place  Neuro: AAOx3, nonfocal  Psych: Normal affect and demeanor, pleasant   Data Reviewed: I have personally reviewed following labs and imaging studies  CBC: Recent Labs  Lab 07/21/19 1210 07/22/19 0505 07/25/19 0447  WBC 8.8 6.7 10.1  NEUTROABS 6.6  --   --   HGB 13.2 12.5* 13.5  HCT 42.2 39.6 42.7  MCV 88.3 85.9 87.3  PLT 247 215 480   Basic Metabolic Panel: Recent Labs  Lab 07/21/19 1210 07/22/19 0505 07/23/19 1012 07/24/19 0605 07/25/19 0447  NA 131* 131* 134* 135 133*  K 4.0 3.5 3.7 3.9 3.9  CL 92* 94* 98 100 94*  CO2 27 27 27 26 25   GLUCOSE 154* 106* 204* 108* 132*  BUN 13 7* 8 8 7*  CREATININE 0.54* 0.42* 0.47* 0.48* 0.60*  CALCIUM 9.1 8.6* 8.0* 8.8* 8.7*   GFR: Estimated Creatinine Clearance: 96.7 mL/min (A) (by C-G formula based on SCr of 0.6 mg/dL (L)). Liver Function Tests: No results for input(s):  AST, ALT, ALKPHOS, BILITOT, PROT, ALBUMIN in the last 168 hours. No results for input(s): LIPASE, AMYLASE in the last 168 hours. No results for input(s): AMMONIA in the last 168 hours. Coagulation Profile: No results for input(s): INR, PROTIME in the last 168 hours. Cardiac Enzymes: No results for input(s): CKTOTAL, CKMB, CKMBINDEX, TROPONINI in the last 168 hours. BNP (last 3 results) No results for input(s): PROBNP in the last 8760 hours. HbA1C: No results for input(s): HGBA1C in the last 72 hours. CBG: Recent Labs  Lab 07/24/19 1235 07/24/19 1250 07/24/19 1622 07/24/19 2141 07/25/19 0602  GLUCAP 252* 241* 132* 157* 132*   Lipid Profile: No results for input(s): CHOL, HDL, LDLCALC, TRIG, CHOLHDL, LDLDIRECT in the last 72 hours. Thyroid Function Tests: No results for input(s): TSH, T4TOTAL, FREET4, T3FREE, THYROIDAB in the last 72 hours. Anemia Panel: No results for input(s): VITAMINB12, FOLATE, FERRITIN, TIBC, IRON, RETICCTPCT in the last 72 hours. Urine analysis:    Component Value Date/Time   COLORURINE YELLOW  07/08/2019 1247   APPEARANCEUR CLEAR 07/08/2019 1247   LABSPEC 1.020 07/08/2019 1247   PHURINE 6.0 07/08/2019 1247   GLUCOSEU NEGATIVE 07/08/2019 1247   HGBUR NEGATIVE 07/08/2019 1247   BILIRUBINUR NEGATIVE 07/08/2019 1247   KETONESUR 5 (A) 07/08/2019 1247   PROTEINUR NEGATIVE 07/08/2019 1247   NITRITE NEGATIVE 07/08/2019 1247   LEUKOCYTESUR TRACE (A) 07/08/2019 1247   Sepsis Labs: @LABRCNTIP (procalcitonin:4,lacticidven:4)  ) Recent Results (from the past 240 hour(s))  Blood culture (routine x 2)     Status: None (Preliminary result)   Collection Time: 07/21/19  1:50 PM   Specimen: BLOOD RIGHT WRIST  Result Value Ref Range Status   Specimen Description BLOOD RIGHT WRIST  Final   Special Requests   Final    BOTTLES DRAWN AEROBIC AND ANAEROBIC Blood Culture results may not be optimal due to an inadequate volume of blood received in culture bottles   Culture   Final    NO GROWTH 4 DAYS Performed at [facility], [place] [address], [place], [place] [address]    Report Status PENDING  Incomplete  Novel Coronavirus, NAA (Hosp order, Send-out to Ref Lab; TAT 18-24 hrs     Status: None   Collection Time: 07/21/19  2:06 PM   Specimen: Nasopharyngeal Swab; Respiratory  Result Value Ref Range Status   SARS-CoV-2, NAA NOT DETECTED NOT DETECTED Final    Comment: (NOTE) This nucleic acid amplification test was developed and its performance characteristics determined by [employer]. Nucleic acid amplification tests include PCR and TMA. This test has not been FDA cleared or approved. This test has been authorized by FDA under an Emergency Use Authorization (EUA). This test is only authorized for the duration of time the declaration that circumstances exist justifying the authorization of the emergency use of in vitro diagnostic tests for detection of SARS-CoV-2 virus and/or diagnosis of COVID-19 infection under section 564(b)(1) of the Act, 21 U.S.C. 786VEH-2(C) (1), unless  the authorization is terminated or revoked sooner. When diagnostic testing is negative, the possibility of a false negative result should be considered in the context of a patient's recent exposures and the presence of clinical signs and symptoms consistent with COVID-19. An individual without symptoms of COVID- 19 and who is not shedding SARS-CoV-2 vi rus would expect to have a negative (not detected) result in this assay. Performed At: [facility] [address] [place], [place] [id number] Rush Farmer MD MO:[id number]    [place]  Final  Comment: Performed at [facility], [place] [address], [place], [place] [address]  Blood culture (routine x 2)     Status: None (Preliminary result)   Collection Time: 07/21/19  3:37 PM   Specimen: BLOOD RIGHT WRIST  Result Value Ref Range Status   Specimen Description BLOOD RIGHT WRIST  Final   Special Requests   Final    BOTTLES DRAWN AEROBIC ONLY Blood Culture results may not be optimal due to an inadequate volume of blood received in culture bottles   Culture   Final    NO GROWTH 4 DAYS Performed at [facility], [place] [address] [place], [place] [address]    Report Status PENDING  Incomplete  Surgical pcr screen     Status: None   Collection Time: 07/23/19 10:01 PM   Specimen: Nasal Mucosa; Nasal Swab  Result Value Ref Range Status   MRSA, PCR NEGATIVE NEGATIVE Final   Staphylococcus aureus NEGATIVE NEGATIVE Final    Comment: (NOTE) The Xpert SA Assay (FDA approved for NASAL specimens in patients 10 years of age and older), is one component of a comprehensive surveillance program. It is not intended to diagnose infection nor to guide or monitor treatment. Performed at [facility], [place] [address] [place], [place] [address]   SARS Coronavirus 2 by RT PCR (hospital order, performed in [facility] hospital lab) Nasopharyngeal Nasopharyngeal Swab     Status: None   Collection Time:  07/24/19  4:46 AM   Specimen: Nasopharyngeal Swab  Result Value Ref Range Status   SARS Coronavirus 2 NEGATIVE NEGATIVE Final    Comment: (NOTE) If result is NEGATIVE SARS-CoV-2 target nucleic acids are NOT DETECTED. The SARS-CoV-2 RNA is generally detectable in upper and lower  respiratory specimens during the acute phase of infection. The lowest  concentration of SARS-CoV-2 viral copies this assay can detect is 250  copies / mL. A negative result does not preclude SARS-CoV-2 infection  and should not be used as the sole basis for treatment or other  patient management decisions.  A negative result may occur with  improper specimen collection / handling, submission of specimen other  than nasopharyngeal swab, presence of viral mutation(s) within the  areas targeted by this assay, and inadequate number of viral copies  (<250 copies / mL). A negative result must be combined with clinical  observations, patient history, and epidemiological information. If result is POSITIVE SARS-CoV-2 target nucleic acids are DETECTED. The SARS-CoV-2 RNA is generally detectable in upper and lower  respiratory specimens dur ing the acute phase of infection.  Positive  results are indicative of active infection with SARS-CoV-2.  Clinical  correlation with patient history and other diagnostic information is  necessary to determine patient infection status.  Positive results do  not rule out bacterial infection or co-infection with other viruses. If result is PRESUMPTIVE POSTIVE SARS-CoV-2 nucleic acids MAY BE PRESENT.   A presumptive positive result was obtained on the submitted specimen  and confirmed on repeat testing.  While 2019 novel coronavirus  (SARS-CoV-2) nucleic acids may be present in the submitted sample  additional confirmatory testing may be necessary for epidemiological  and / or clinical management purposes  to differentiate between  SARS-CoV-2 and other Sarbecovirus currently known to  infect humans.  If clinically indicated additional testing with an alternate test  methodology [phone]) is advised. The SARS-CoV-2 RNA is generally  detectable in upper and lower respiratory sp ecimens during the acute  phase of  infection. The expected result is Negative. Fact Sheet for Patients:  StrictlyIdeas.no Fact Sheet for Healthcare Providers: BankingDealers.co.za This test is not yet approved or cleared by the [place] FDA and has been authorized for detection and/or diagnosis of SARS-CoV-2 by FDA under an Emergency Use Authorization (EUA).  This EUA will remain in effect (meaning this test can be used) for the duration of the COVID-19 declaration under Section 564(b)(1) of the Act, 21 U.S.C. section 360bbb-3(b)(1), unless the authorization is terminated or revoked sooner. Performed at [facility], [place] [address] [place], [place] [address]       Radiology Studies: Mr Foot Left W Wo Contrast  Result Date: 07/23/2019 CLINICAL DATA:  Osteomyelitis, foot swelling, diabetic EXAM: MRI OF THE LEFT FOREFOOT WITHOUT AND WITH CONTRAST TECHNIQUE: Multiplanar, multisequence MR imaging of the left forefoot was performed both before and after administration of intravenous contrast. CONTRAST:  42m GADAVIST GADOBUTROL 1 MMOL/ML IV SOLN COMPARISON:  None. FINDINGS: Bones/Joint/Cartilage There is increased marrow signal seen throughout the fifth metatarsal head with subtle T1 hypointensity. There is a small joint effusion seen at the fifth MTP joint. No definite area of cortical destruction however is noted. 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 There is hallux valgus deformity of the forefoot. Ligaments The Lisfranc ligament and partially visualized collateral ligaments are intact. Muscles and Tendons There is mild fatty atrophy noted within the muscles surrounding the forefoot. There is also diffusely increased  signal seen throughout the muscles, likely due to the denervation atrophy. The flexor and extensor tendons appear to be intact. The plantar fascia is intact. Soft tissues Diffuse dorsal soft tissue edema seen. There is a area of superficial ulceration seen overlying the lateral aspect of the fifth MTP joint measuring approximately a 2 cm in length. Small subcutaneous emphysema seen within this region. There is a focal fluid collection measuring approximately 2 cm seen beneath this area which extends to the plantar surface of the fifth metatarsal head. IMPRESSION: 1. Area of superficial ulceration with subcutaneous emphysema and early abscess/sinus tract extending to the fifth metatarsal head. 2. There is findings likely consistent with acute osteomyelitis involving the fifth metatarsal head. 3. Small joint effusion at the fifth MTP joint. Electronically Signed   By: BPrudencio PairM.D.   On: 07/23/2019 14:07   Vas UKoreaABurnard BuntingWith/wo Tbi  Result Date: 07/23/2019 LOWER EXTREMITY DOPPLER STUDY Indications: Ulceration. High Risk Factors: Diabetes.  Limitations: Today's exam was limited due to patient pain tolerance. Comparison Study: No prior studies. Performing Technologist: CCarlos LeveringRvt  Examination Guidelines: A complete evaluation includes at minimum, Doppler waveform signals and systolic blood pressure reading at the level of bilateral brachial, anterior tibial, and posterior tibial arteries, when vessel segments are accessible. Bilateral testing is considered an integral part of a complete examination. Photoelectric Plethysmograph (PPG) waveforms and toe systolic pressure readings are included as required and additional duplex testing as needed. Limited examinations for reoccurring indications may be performed as noted.  ABI Findings: +---------+------------------+-----+---------+--------+  Right     Rt Pressure (mmHg) Index Waveform  Comment   +---------+------------------+-----+---------+--------+  Brachial   118                      triphasic           +---------+------------------+-----+---------+--------+  PTA       130                1.10  triphasic           +---------+------------------+-----+---------+--------+  DP        135                1.14  triphasic           +---------+------------------+-----+---------+--------+  Great Toe 101                0.86                      +---------+------------------+-----+---------+--------+ +---------+------------------+-----+---------+-----------+  Left      Lt Pressure (mmHg) Index Waveform  Comment      +---------+------------------+-----+---------+-----------+  Brachial                                     Contracture  +---------+------------------+-----+---------+-----------+  PTA       131                1.11  triphasic              +---------+------------------+-----+---------+-----------+  DP        132                1.12  triphasic              +---------+------------------+-----+---------+-----------+  Great Toe 85                 0.72                         +---------+------------------+-----+---------+-----------+ +-------+-----------+-----------+------------+------------+  ABI/TBI Today's ABI Today's TBI Previous ABI Previous TBI  +-------+-----------+-----------+------------+------------+  Right   1.14        0.86                                   +-------+-----------+-----------+------------+------------+  Left    1.12        0.72                                   +-------+-----------+-----------+------------+------------+  Summary: Right: Resting right ankle-brachial index is within normal range. No evidence of significant right lower extremity arterial disease. The right toe-brachial index is normal. Left: Resting left ankle-brachial index is within normal range. No evidence of significant left lower extremity arterial disease. The left toe-brachial index is normal. The blood pressure cuff was placed on the proximal calf for patient comfort.  *See  table(s) above for measurements and observations.  Electronically signed by Servando Snare MD on 07/23/2019 at 4:26:04 PM.    Final      Scheduled Meds:  aspirin EC  81 mg Oral Daily   brimonidine  1 drop Both Eyes TID   docusate sodium  100 mg Oral BID   dorzolamide-timolol  1 drop Right Eye BID   enoxaparin (LOVENOX) injection  40 mg Subcutaneous Q24H   escitalopram  10 mg Oral Daily   gabapentin  100 mg Oral BID   insulin aspart  0-5 Units Subcutaneous QHS   insulin aspart  0-9 Units Subcutaneous TID WC   latanoprost  1 drop Both Eyes QHS   pantoprazole  40 mg Oral Daily   rosuvastatin  20 mg Oral Daily   sodium chloride flush  3 mL Intravenous Q12H   tamsulosin  0.4 mg Oral Daily  Continuous Infusions:  ampicillin-sulbactam (UNASYN) IV 3 g (07/25/19 0548)     LOS: 4 days   Time Spent in minutes   30 minutes   Rickita Forstner D.O. on 07/25/2019 at 9:07 AM  Between 7am to 7pm - Please see pager noted on [email or web address]  After 7pm go to [email or web address]  And look for the night coverage person covering for me after hours  [facility] Office  [phone]

## 2019-07-26 LAB — GLUCOSE, CAPILLARY
Glucose-Capillary: 126 mg/dL — ABNORMAL HIGH (ref 70–99)
Glucose-Capillary: 143 mg/dL — ABNORMAL HIGH (ref 70–99)
Glucose-Capillary: 189 mg/dL — ABNORMAL HIGH (ref 70–99)
Glucose-Capillary: 162 mg/dL — ABNORMAL HIGH (ref 70–99)
Glucose-Capillary: 121 mg/dL — ABNORMAL HIGH (ref 70–99)

## 2019-07-26 LAB — CULTURE, BLOOD (ROUTINE X 2)
Culture: NO GROWTH
Culture: NO GROWTH

## 2019-07-26 LAB — BASIC METABOLIC PANEL
Anion gap: 9 (ref 5–15)
BUN: 18 mg/dL (ref 8–23)
CO2: 27 mmol/L (ref 22–32)
Calcium: 8.4 mg/dL — ABNORMAL LOW (ref 8.9–10.3)
Chloride: 99 mmol/L (ref 98–111)
Creatinine, Ser: 0.46 mg/dL — ABNORMAL LOW (ref 0.61–1.24)
GFR calc Af Amer: >60 mL/min (ref >60)
GFR calc non Af Amer: >60 mL/min (ref >60)
Glucose, Bld: 122 mg/dL — ABNORMAL HIGH (ref 70–99)
Potassium: 3.7 mmol/L (ref 3.5–5.1)
Sodium: 135 mmol/L (ref 135–145)

## 2019-07-26 MED ORDER — TRAMADOL HCL 50 MG PO TABS: 50.0000 mg | ORAL_TABLET | ORAL | Status: DC | PRN

## 2019-07-26 MED ORDER — HYDROCODONE-ACETAMINOPHEN 5-325 MG PO TABS
2.0000 | ORAL_TABLET | ORAL | Status: DC | PRN
  Administered 2019-07-26 – 2019-07-28 (×3): 2 via ORAL
  Filled 2019-07-26 (×3): qty 2

## 2019-07-26 NOTE — Progress Notes (Signed)
PROGRESS NOTE    Xavier Turner  MRN:[id number] DOB: 06/07/1956 DOA: 07/21/2019 PCP: Keller, David C, MD   Brief Narrative:  HPI On 07/20/2001 by Dr. Rondell Smith Xavier Turner is a 63 y.o. male with medical history significant of diabetes mellitus type 2, hypertension, hyperlipidemia, OSA, and Sturge-Weber syndrome involving face.  He presents with complaints of redness of the left foot.  Patient had initially been seen in the emergency department on 10/6 with redness of his left leg.  He underwent Doppler ultrasound which was negative for DVT and was discharged home on Keflex.  He returned to the hospital on 10/12 and was admitted for cellulitis of the left leg failing outpatient therapy.  He was initially started on Rocephin and switched to cefdinir to complete a 7-day course following discharge home on 10/15.  He was discharged back to the group home and reported taking medication as prescribed with improvement in symptoms.  In talks with his sister over the phone who had seen his foot last week reports hat it looked a lot better at that time.  The patient stated that his foot has been wrapped, but sister noted that his foot was not wrapped and she saw it last week.  He completed antibiotics 2 days ago, and reports that since that time redness and swelling returned, but now in his foot.  Notes associated symptoms of clearish drainage from the wound of the aspect of his foot.  Denies having any significant fever, chills, nausea, vomiting, or diarrhea.  Patient does have a podiatrist in the outpatient setting who clips his toenails.  Interim history Patient mated with recurrent left foot cellulitis.  ID and orthopedis consulted. Currently on IV antibiotics. 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  Patient recently hospitalized and treated for left foot cellulitis and discharged with oral completion of antibiotics. -Left foot x-ray  shows focal ulceration on the lateral base of the fifth proximal phalanx joint with subcutaneous gas and a few punctate radiodensities which appear confined to the skin surface.  No osseous erosion to suggest osteomyelitis however MRI would be sensitive.  Circumferential swelling of the foot. -Question whether patient was having his foot to right at the group home given that he does have a diabetic ulcer of the lateral aspect of his left foot -ESR and CRP both elevated -Blood cultures show no growth to date -Erythema has improved mildly today -Infectious disease consulted and appreciated, recommended 3 additional days of IV antibiotics, followed by 5 days of Augmentin -Orthopedic surgery consulted and appreciated. S/p MRI showing osteomyelitis of the 5th metatarsal head. 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 Duda: Nonweightbearing left foot ideally however weightbearing as needed on the left heel is okay  Diabetes mellitus, type II  -Hemoglobin A1c 6.6 on 07/06/2019 -CBGs stable -Hold Januvia and Metformin -Continue insulin sliding scale with CBG monitoring   Hyponatremia -Appears to be acute on chronic -Sodium on admission 131 -Sodium earlier this month 126- appears to have a baseline of approximately 130 -currently 135 -Continue to monitor BMP  Essential hypertension -Hydrochlorothiazide held due to hyponatremia -BP stable  Hyperlipidemia -Continue statin  BPH  -Continue Flomax  Intellectual delay -Patient resides at a group home  Sturge-Weber syndrome -Stable continue outpatient management    GERD -Continue PPI  DVT Prophylaxis  Lovenox  Code Status: Full  Family Communication: None at bedside  Disposition Plan: Admitted.  Disposition SNF  Consultants  Infectious disease Orthopedic surgery  Procedures  Left 5th ray amputation  Antibiotics   Anti-infectives (From admission, onward)   Start     Dose/Rate Route Frequency Ordered Stop   07/24/19 0600  ceFAZolin (ANCEF) IVPB 2g/100 mL premix  Status:  Discontinued     2 g 200 mL/hr over 30 Minutes Intravenous On call to O.R. 07/23/19 2155 07/24/19 0922   07/22/19 1500  cefTRIAXone (ROCEPHIN) 1 g in sodium chloride 0.9 % 100 mL IVPB  Status:  Discontinued     1 g 200 mL/hr over 30 Minutes Intravenous Every 24 hours 07/21/19 1550 07/22/19 1100   07/22/19 1130  Ampicillin-Sulbactam (UNASYN) 3 g in sodium chloride 0.9 % 100 mL IVPB     3 g 200 mL/hr over 30 Minutes Intravenous Every 6 hours 07/22/19 1100     07/21/19 1500  cefTRIAXone (ROCEPHIN) injection 1 g  Status:  Discontinued     1 g Intramuscular  Once 07/21/19 1458 07/21/19 1458   07/21/19 1500  cefTRIAXone (ROCEPHIN) 1 g in sodium chloride 0.9 % 100 mL IVPB     1 g 200 mL/hr over 30 Minutes Intravenous  Once 07/21/19 1458 07/21/19 1619      Subjective:   Xavier Turner seen and examined today.  Complains of continued left foot pain and soreness.  States that the medications wear off too quickly.  Denies current chest pain or shortness of breath, abdominal pain, nausea or vomiting, diarrhea or constipation, dizziness or headache. Objective:   Vitals:   07/25/19 1116 07/25/19 1615 07/26/19 0009 07/26/19 0631  BP: 119/66 109/74 107/64 118/69  Pulse: 76 80 72 69  Resp: 18  17   Temp: 98.1 F (36.7 C) 97.6 F (36.4 C) 98.2 F (36.8 C)   TempSrc: Oral Oral Oral   SpO2: 94% 98% 95% 94%  Weight:      Height:        Intake/Output Summary (Last 24 hours) at 07/26/2019 8101 Last data filed at 07/26/2019 7510 Gross per 24 hour  Intake 880 ml  Output 750 ml  Net 130 ml   Filed Weights   07/21/19 1600  Weight: 93.5 kg    Exam (no change from physical exam on 07/25/2019)  General: Well developed, well nourished, NAD,  appears stated age  HEENT: NCAT, mucous membranes moist. Sturge-Weber mark on R side of face  Cardiovascular: S1 S2 auscultated, RRR, no murmur  Respiratory: Clear to auscultation bilaterally with equal chest rise  Abdomen: Soft, nontender, nondistended, + bowel sounds  Extremities: warm dry without cyanosis clubbing or edema of RLE. Left foot with dressing in place  Neuro: AAOx3, nonfocal  Psych: Normal affect and demeanor, pleasant   Data Reviewed: I have personally reviewed following labs and imaging studies  CBC: Recent Labs  Lab 07/21/19 1210 07/22/19 0505 07/25/19 0447  WBC 8.8 6.7 10.1  NEUTROABS 6.6  --   --   HGB 13.2 12.5* 13.5  HCT 42.2 39.6 42.7  MCV 88.3 85.9 87.3  PLT 247 215 258   Basic Metabolic Panel: Recent Labs  Lab 07/22/19 0505 07/23/19 1012 07/24/19 0605 07/25/19 0447 07/26/19 0518  NA 131* 134* 135 133* 135  K 3.5 3.7 3.9 3.9 3.7  CL 94* 98 100 94* 99  CO2 _0 GLUCOSE 106* 204* 108*  132* 122*  BUN 7* 8 8 7* 18  CREATININE 0.42* 0.47* 0.48* 0.60* 0.46*  CALCIUM 8.6* 8.0* 8.8* 8.7* 8.4*   GFR: Estimated Creatinine Clearance: 96.7 mL/min (A) (by C-G formula based on SCr of 0.46 mg/dL (L)). Liver Function Tests: No results for input(s): AST, ALT, ALKPHOS, BILITOT, PROT, ALBUMIN in the last 168 hours. No results for input(s): LIPASE, AMYLASE in the last 168 hours. No results for input(s): AMMONIA in the last 168 hours. Coagulation Profile: No results for input(s): INR, PROTIME in the last 168 hours. Cardiac Enzymes: No results for input(s): CKTOTAL, CKMB, CKMBINDEX, TROPONINI in the last 168 hours. BNP (last 3 results) No results for input(s): PROBNP in the last 8760 hours. HbA1C: No results for input(s): HGBA1C in the last 72 hours. CBG: Recent Labs  Lab 07/25/19 0602 07/25/19 1113 07/25/19 1637 07/25/19 2146 07/26/19 0633  GLUCAP 132* 159* 198* 167* 126*   Lipid Profile: No results for input(s): CHOL, HDL,  LDLCALC, TRIG, CHOLHDL, LDLDIRECT in the last 72 hours. Thyroid Function Tests: No results for input(s): TSH, T4TOTAL, FREET4, T3FREE, THYROIDAB in the last 72 hours. Anemia Panel: No results for input(s): VITAMINB12, FOLATE, FERRITIN, TIBC, IRON, RETICCTPCT in the last 72 hours. Urine analysis:    Component Value Date/Time   COLORURINE YELLOW 07/08/2019 [place] 07/08/2019 1247   LABSPEC 1.020 07/08/2019 1247   PHURINE 6.0 07/08/2019 [place] 07/08/2019 1247   HGBUR NEGATIVE 07/08/2019 [place] 07/08/2019 1247   KETONESUR 5 (A) 07/08/2019 1247   PROTEINUR NEGATIVE 07/08/2019 1247   NITRITE NEGATIVE 07/08/2019 1247   LEUKOCYTESUR TRACE (A) 07/08/2019 1247   Sepsis Labs: _0 (procalcitonin:4,lacticidven:4)  ) Recent Results (from the past 240 hour(s))  Blood culture (routine x 2)     Status: None   Collection Time: 07/21/19  1:50 PM   Specimen: BLOOD RIGHT WRIST  Result Value Ref Range Status   Specimen Description BLOOD RIGHT WRIST  Final   Special Requests   Final    BOTTLES DRAWN AEROBIC AND ANAEROBIC Blood Culture results may not be optimal due to an inadequate volume of blood received in culture bottles   Culture   Final    NO GROWTH 5 DAYS Performed at [facility], [place] [address], [place], [place] [address]    Report Status 07/26/2019 FINAL  Final  Novel Coronavirus, NAA (Hosp order, Send-out to Ref Lab; TAT 18-24 hrs     Status: None   Collection Time: 07/21/19  2:06 PM   Specimen: Nasopharyngeal Swab; Respiratory  Result Value Ref Range Status   SARS-CoV-2, NAA NOT DETECTED NOT DETECTED Final    Comment: (NOTE) This nucleic acid amplification test was developed and its performance characteristics determined by [employer]. Nucleic acid amplification tests include PCR and TMA. This test has not been FDA cleared or approved. This test has been authorized by FDA under an Emergency Use  Authorization (EUA). This test is only authorized for the duration of time the declaration that circumstances exist justifying the authorization of the emergency use of in vitro diagnostic tests for detection of SARS-CoV-2 virus and/or diagnosis of COVID-19 infection under section 564(b)(1) of the Act, 21 U.S.C. 179XTA-5(W) (1), unless the authorization is terminated or revoked sooner. When diagnostic testing is negative, the possibility of a false negative result should be considered in the context of a patient's recent exposures and the presence of clinical signs and symptoms consistent with COVID-19. An individual without  symptoms of COVID- 19 and who is not shedding SARS-CoV-2 vi rus would expect to have a negative (not detected) result in this assay. Performed At: [facility] [address] [place], [place] [id number] Rush Farmer MD [id number]    [place]  Final    Comment: Performed at [facility], [place] [address] [place], [place] [address]  Blood culture (routine x 2)     Status: None   Collection Time: 07/21/19  3:37 PM   Specimen: BLOOD RIGHT WRIST  Result Value Ref Range Status   Specimen Description BLOOD RIGHT WRIST  Final   Special Requests   Final    BOTTLES DRAWN AEROBIC ONLY Blood Culture results may not be optimal due to an inadequate volume of blood received in culture bottles   Culture   Final    NO GROWTH 5 DAYS Performed at [facility], [place] [address], [place], [place] [address]    Report Status 07/26/2019 FINAL  Final  Surgical pcr screen     Status: None   Collection Time: 07/23/19 10:01 PM   Specimen: Nasal Mucosa; Nasal Swab  Result Value Ref Range Status   MRSA, PCR NEGATIVE NEGATIVE Final   Staphylococcus aureus NEGATIVE NEGATIVE Final    Comment: (NOTE) The Xpert SA Assay (FDA approved for NASAL specimens in patients 38 years of age and older), is one component of a comprehensive surveillance  program. It is not intended to diagnose infection nor to guide or monitor treatment. Performed at [facility], [place] [address], [place], [place] [address]   SARS Coronavirus 2 by RT PCR (hospital order, performed in [facility] hospital lab) Nasopharyngeal Nasopharyngeal Swab     Status: None   Collection Time: 07/24/19  4:46 AM   Specimen: Nasopharyngeal Swab  Result Value Ref Range Status   SARS Coronavirus 2 NEGATIVE NEGATIVE Final    Comment: (NOTE) If result is NEGATIVE SARS-CoV-2 target nucleic acids are NOT DETECTED. The SARS-CoV-2 RNA is generally detectable in upper and lower  respiratory specimens during the acute phase of infection. The lowest  concentration of SARS-CoV-2 viral copies this assay can detect is 250  copies / mL. A negative result does not preclude SARS-CoV-2 infection  and should not be used as the sole basis for treatment or other  patient management decisions.  A negative result may occur with  improper specimen collection / handling, submission of specimen other  than nasopharyngeal swab, presence of viral mutation(s) within the  areas targeted by this assay, and inadequate number of viral copies  (<250 copies / mL). A negative result must be combined with clinical  observations, patient history, and epidemiological information. If result is POSITIVE SARS-CoV-2 target nucleic acids are DETECTED. The SARS-CoV-2 RNA is generally detectable in upper and lower  respiratory specimens dur ing the acute phase of infection.  Positive  results are indicative of active infection with SARS-CoV-2.  Clinical  correlation with patient history and other diagnostic information is  necessary to determine patient infection status.  Positive results do  not rule out bacterial infection or co-infection with other viruses. If result is PRESUMPTIVE POSTIVE SARS-CoV-2 nucleic acids MAY BE PRESENT.   A presumptive positive result was obtained on the submitted specimen   and confirmed on repeat testing.  While 2019 novel coronavirus  (SARS-CoV-2) nucleic acids may be present in the submitted sample  additional confirmatory testing may be necessary for epidemiological  and / or clinical management purposes  to differentiate between  SARS-CoV-2 and other Sarbecovirus currently known to infect humans.  If clinically indicated additional testing with an alternate test  methodology ([id number]) is advised. The SARS-CoV-2 RNA is generally  detectable in upper and lower respiratory sp ecimens during the acute  phase of infection. The expected result is Negative. Fact Sheet for Patients:  https://www.fda.gov/media/136312/download Fact Sheet for Healthcare Providers: https://www.fda.gov/media/136313/download This test is not yet approved or cleared by the [place] FDA and has been authorized for detection and/or diagnosis of SARS-CoV-2 by FDA under an Emergency Use Authorization (EUA).  This EUA will remain in effect (meaning this test can be used) for the duration of the COVID-19 declaration under Section 564(b)(1) of the Act, 21 U.S.C. section 360bbb-3(b)(1), unless the authorization is terminated or revoked sooner. Performed at [facility], [address] [place], [place] [address]       Radiology Studies: No results found.   Scheduled Meds: . aspirin EC  81 mg Oral Daily  . brimonidine  1 drop Both Eyes TID  . docusate sodium  100 mg Oral BID  . dorzolamide-timolol  1 drop Right Eye BID  . enoxaparin (LOVENOX) injection  40 mg Subcutaneous Q24H  . escitalopram  10 mg Oral Daily  . gabapentin  100 mg Oral BID  . insulin aspart  0-5 Units Subcutaneous QHS  . insulin aspart  0-9 Units Subcutaneous TID WC  . latanoprost  1 drop Both Eyes QHS  . pantoprazole  40 mg Oral Daily  . rosuvastatin  20 mg Oral Daily  . sodium chloride flush  3 mL Intravenous Q12H  . tamsulosin  0.4 mg Oral Daily   Continuous Infusions: . ampicillin-sulbactam  (UNASYN) IV 3 g (07/26/19 0536)     LOS: 5 days   Time Spent in minutes   30 minutes   Maryann Mikhail D.O. on 07/26/2019 at 8:32 AM  Between 7am to 7pm - Please see pager noted on [email or web address]  After 7pm go to [email or web address]  And look for the night coverage person covering for me after hours  [facility] Office  [phone]  

## 2019-07-27 DIAGNOSIS — Z8631 Personal history of diabetic foot ulcer: Secondary | ICD-10-CM

## 2019-07-27 LAB — GLUCOSE, CAPILLARY
Glucose-Capillary: 135 mg/dL — ABNORMAL HIGH (ref 70–99)
Glucose-Capillary: 211 mg/dL — ABNORMAL HIGH (ref 70–99)
Glucose-Capillary: 178 mg/dL — ABNORMAL HIGH (ref 70–99)
Glucose-Capillary: 222 mg/dL — ABNORMAL HIGH (ref 70–99)

## 2019-07-27 LAB — SARS CORONAVIRUS 2 (TAT 6-24 HRS): SARS Coronavirus 2: NEGATIVE

## 2019-07-27 NOTE — Progress Notes (Signed)
S/P r 5th ray amputation. Comfortable. Dressing CDI. Toes warm and pink Has been trying to weightbear on heel only. Moderate assist for transfers. VSS. Plan for transfer to SNF. Will need follow up Dr. Sharol Given 1 week

## 2019-07-27 NOTE — Discharge Summary (Signed)
Physician Discharge Summary  Xavier Turner YQM:[id number] DOB: 1956-03-09 DOA: 07/21/2019  PCP: Harden Mo, MD  Admit date: 07/21/2019 Discharge date: 07/28/2019  Time spent: 45 minutes  Recommendations for Outpatient Follow-up:  Patient will be discharged to skilled nursing facility, continue physical and occupational therapy.  Patient will need to follow up with primary care provider within one week of discharge. Follow up with Dr. Sharol Given in one week.  Patient should continue medications as prescribed.  Patient should follow a carb modified diet.    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 [name] a 63 y.o.malewith medical history significant ofdiabetes mellitus type 2, hypertension, hyperlipidemia, OSA, and Sturge-Weber syndrome involving face. He presents with complaints of redness of the left foot. Patient had initially been seen in the emergency department on 10/6 with redness of his left leg. He underwent Doppler ultrasound which was negative for DVT and was discharged home on Keflex. He returned to the hospital on 10/12and was admitted for cellulitis of the left leg failing outpatient therapy. He was initially started on Rocephin and switched to cefdinir to complete a 7-day course following discharge home on 10/15. He was discharged back to the group home and reported taking medication as prescribed with improvement in symptoms. In talks with his sister over the phone who had seen his foot last week reportshat it looked a lot better at that time. The patient stated that his foot has been wrapped,  but sister noted that his foot was not wrapped and she saw itlast week. He completed antibiotics 2 days ago, and reports that since that time redness and swelling returned,but now in hisfoot. Notes associated symptoms of clearish drainage from the wound of the aspect of his foot. Denies having any significant fever, chills, nausea,vomiting, ordiarrhea. Patient does have a podiatrist in the outpatient setting who clips his toenails.  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  Patient recently hospitalized and treated for left foot cellulitis and discharged with oral completion of antibiotics. -Left foot x-ray shows focal ulceration on the lateral base of the fifth proximal phalanx joint with subcutaneous gas and a few punctate radiodensities which appear confined to the skin surface.  No osseous erosion to suggest osteomyelitis however MRI would be sensitive.  Circumferential swelling of the foot. -Question whether patient was having his foot to right at the group home given that he does have a diabetic ulcer of the lateral aspect of his left foot -ESR and CRP both elevated -Blood cultures show no growth to date -Infectious disease consulted and appreciated, recommended 3 additional days of IV antibiotics, followed by 5 days of Augmentin -Orthopedic surgery consulted and appreciated. S/p MRI showing osteomyelitis of the 5th metatarsal head. 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 Sharol Given: Nonweightbearing left foot ideally however weightbearing as needed on the left heel is okay  Diabetes mellitus, type II  -Hemoglobin A1c 6.6 on 07/06/2019 -CBGs stable -Januvia and Metformin held- may resume on discharge  Hyponatremia -Appears to be acute on chronic -Sodium on admission 131 -Sodium earlier  this month 126- appears to have a baseline of  approximately 130 -currently 135  Essential hypertension -Hydrochlorothiazide held due to hyponatremia- may resume on discharge -BP stable  Hyperlipidemia -Continue statin  BPH  -Continue Flomax  Intellectual delay -Patient resides at a group home  Sturge-Weber syndrome -Stable continue outpatient management  GERD -Continue PPI  Consultants Infectious disease Orthopedic surgery  Procedures  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  Cardiovascular: S1 S2 auscultated, RRR, no murmur  Respiratory: Clear to auscultation bilaterally  Abdomen: Soft, nontender, nondistended, + bowel sounds  Extremities: warm dry without cyanosis clubbing or edema of RLE. LLE with dressing in place   Neuro: AAOx3, nonfocal  Psych: Normal affect and demeanor, pleasant  Discharge Instructions Discharge Instructions    Discharge instructions   Complete by: As directed    Patient will be discharged to skilled nursing facility, continue physical and occupational therapy.  Patient will need to follow up with primary care provider within one week of discharge. Follow up with Dr. Sharol Given in one week.  Patient should continue medications as prescribed.  Patient should follow a carb modified diet.     Allergies as of 07/28/2019   No Active Allergies     Medication List    STOP taking these medications   Armodafinil 250 MG tablet     TAKE these medications   amoxicillin-clavulanate 875-125 MG tablet Commonly known as: Augmentin Take 1 tablet by mouth 2 (two) times daily for 5 days.   ARTIFICIAL TEAR OP Place 1 drop into both eyes 4 (four) times daily as needed (dry eyes).   aspirin 81 MG tablet Take 81 mg by mouth daily.     brimonidine 0.2 % ophthalmic solution Commonly known as: ALPHAGAN Place 1 drop into both eyes 3 (three) times daily.   cholecalciferol 25 MCG (1000 UT) tablet Commonly known as: VITAMIN D3 Take 1,000 Units by mouth daily.   dorzolamide-timolol 22.3-6.8 MG/ML ophthalmic solution Commonly known as: COSOPT Place 1 drop into the right eye 2 (two) times daily.   escitalopram 10 MG tablet Commonly known as: LEXAPRO Take 10 mg by mouth daily.   Eucerin Skin Calming Crea Apply 1 application topically as needed (dry skin).   gabapentin 100 MG capsule Commonly known as: NEURONTIN Take 100 mg by mouth 2 (two) times daily.   hydrochlorothiazide 25 MG tablet Commonly known as: HYDRODIURIL Take 25 mg by mouth daily.   HYDROcodone-acetaminophen 5-325 MG tablet Commonly known as: NORCO/VICODIN Take 1-2 tablets by mouth every 4 (four) hours as needed for severe pain.   meloxicam 7.5 MG tablet Commonly known as: MOBIC Take 7.5 mg by mouth daily.   metFORMIN 1000 MG tablet Commonly known as: GLUCOPHAGE Take 1,000 mg by mouth 2 (two) times daily with a meal.   omeprazole 20 MG capsule Commonly known as: PRILOSEC Take 20 mg by mouth daily.   Rocklatan 0.02-0.005 % Soln Generic drug: Netarsudil-Latanoprost Place 1 drop into both eyes at bedtime.   rosuvastatin 20 MG tablet Commonly known as: CRESTOR Take 20 mg by mouth daily.   sitaGLIPtin 100 MG tablet Commonly known as: JANUVIA Take 100 mg by mouth daily.   tamsulosin 0.4 MG Caps  capsule Commonly known as: FLOMAX Take 0.4 mg by mouth daily.      No Active Allergies Follow-up Information    Newt Minion, MD In 1 week.   Specialty: Orthopedic Surgery Contact information: [place] [id number] [phone]            The results of significant diagnostics from this hospitalization (including imaging, microbiology, ancillary and laboratory) are listed below for reference.     Significant Diagnostic Studies: Mr Foot Left W Wo Contrast  Result Date: 07/23/2019 CLINICAL DATA:  Osteomyelitis, foot swelling, diabetic EXAM: MRI OF THE LEFT FOREFOOT WITHOUT AND WITH CONTRAST TECHNIQUE: Multiplanar, multisequence MR imaging of the left forefoot was performed both before and after administration of intravenous contrast. CONTRAST:  21m GADAVIST GADOBUTROL 1 MMOL/ML IV SOLN COMPARISON:  None. FINDINGS: Bones/Joint/Cartilage There is increased marrow signal seen throughout the fifth metatarsal head with subtle T1 hypointensity. There is a small joint effusion seen at the fifth MTP joint. No definite area of cortical destruction however is noted. 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 There is hallux valgus deformity of the forefoot. Ligaments The Lisfranc ligament and partially visualized collateral ligaments are intact. Muscles and Tendons There is mild fatty atrophy noted within the muscles surrounding the forefoot. There is also diffusely increased signal seen throughout the muscles, likely due to the denervation atrophy. The flexor and extensor tendons appear to be intact. The plantar fascia is intact. Soft tissues Diffuse dorsal soft tissue edema seen. There is a area of superficial ulceration seen overlying the lateral aspect of the fifth MTP joint measuring approximately a 2 cm in length. Small subcutaneous emphysema seen within this region. There is a focal fluid collection measuring approximately 2 cm seen beneath this area which extends to the plantar surface of the fifth metatarsal head. IMPRESSION: 1. Area of superficial ulceration with subcutaneous emphysema and early abscess/sinus tract extending to the fifth metatarsal head. 2. There is findings likely consistent with acute osteomyelitis involving the fifth metatarsal head. 3. Small joint effusion at the fifth MTP joint. Electronically Signed   By: BPrudencio PairM.D.   On: 07/23/2019 14:07   Dg Foot  Complete Left  Result Date: 07/21/2019 CLINICAL DATA:  Infected wound, left foot and leg swelling EXAM: LEFT FOOT - COMPLETE 3+ VIEW COMPARISON:  None. FINDINGS: The osseous structures appear diffusely demineralized which may limit detection of small or nondisplaced fractures. Circumferential swelling of the foot. Focal ulceration noted along the lateral base of the fifth metatarsophalangeal joint with subcutaneous gas and few punctate radiodensities which appear confined to the skin surface. No subjacent osseous erosion or periostitis. Large osteophyte formations are noted at the bases of the third fourth and fifth proximal phalanges. Additional degenerative changes are noted throughout the mid and hindfoot. Large corticated os peroneum is noted IMPRESSION: 1. Focal ulceration along the lateral base of the fifth proximal phalanx joint with subcutaneous gas and few punctate radiodensities which appear confined to the skin surface. No subjacent osseous erosion to suggest early radiographic signs of osteomyelitis however MRI would be more sensitive and specific for early features. 2. Circumferential swelling of the foot. 3. Degenerative changes as above. Electronically Signed   By: PLovena LeM.D.   On: 07/21/2019 14:39   Vas UKoreaABurnard BuntingWith/wo Tbi  Result Date: 07/23/2019 LOWER EXTREMITY DOPPLER STUDY Indications: Ulceration. High Risk Factors: Diabetes.  Limitations: Today's exam was limited due to patient pain tolerance. Comparison Study: No prior  studies. Performing Technologist: Carlos Levering Rvt  Examination Guidelines: A complete evaluation includes at minimum, Doppler waveform signals and systolic blood pressure reading at the level of bilateral brachial, anterior tibial, and posterior tibial arteries, when vessel segments are accessible. Bilateral testing is considered an integral part of a complete examination. Photoelectric Plethysmograph (PPG) waveforms and toe systolic pressure readings are included  as required and additional duplex testing as needed. Limited examinations for reoccurring indications may be performed as noted.  ABI Findings: +---------+------------------+-----+---------+--------+  Right     Rt Pressure (mmHg) Index Waveform  Comment   +---------+------------------+-----+---------+--------+  Brachial  118                      triphasic           +---------+------------------+-----+---------+--------+  PTA       130                1.10  triphasic           +---------+------------------+-----+---------+--------+  DP        135                1.14  triphasic           +---------+------------------+-----+---------+--------+  Great Toe 101                0.86                      +---------+------------------+-----+---------+--------+ +---------+------------------+-----+---------+-----------+  Left      Lt Pressure (mmHg) Index Waveform  Comment      +---------+------------------+-----+---------+-----------+  Brachial                                     Contracture  +---------+------------------+-----+---------+-----------+  PTA       131                1.11  triphasic              +---------+------------------+-----+---------+-----------+  DP        132                1.12  triphasic              +---------+------------------+-----+---------+-----------+  Great Toe 85                 0.72                         +---------+------------------+-----+---------+-----------+ +-------+-----------+-----------+------------+------------+  ABI/TBI Today's ABI Today's TBI Previous ABI Previous TBI  +-------+-----------+-----------+------------+------------+  Right   1.14        0.86                                   +-------+-----------+-----------+------------+------------+  Left    1.12        0.72                                   +-------+-----------+-----------+------------+------------+  Summary: Right: Resting right ankle-brachial index is within normal range. No evidence of significant right lower  extremity arterial disease. The right toe-brachial index is normal. Left: Resting left ankle-brachial index is within normal range. No evidence of significant left lower  extremity arterial disease. The left toe-brachial index is normal. The blood pressure cuff was placed on the proximal calf for patient comfort.  *See table(s) above for measurements and observations.  Electronically signed by Servando Snare MD on 07/23/2019 at 4:26:04 PM.    Final    Le Venous  Result Date: 07/01/2019  Lower Venous Study Indications: Edema, and Erythema.  Limitations: LE edema, depth of vessels. Comparison Study: no prior Performing Technologist: June Leap RDMS, RVT  Examination Guidelines: A complete evaluation includes B-mode imaging, spectral Doppler, color Doppler, and power Doppler as needed of all accessible portions of each vessel. Bilateral testing is considered an integral part of a complete examination. Limited examinations for reoccurring indications may be performed as noted.  +---------+---------------+---------+-----------+----------+-------------------+  LEFT      Compressibility Phasicity Spontaneity Properties Thrombus Aging       +---------+---------------+---------+-----------+----------+-------------------+  CFV       Full            Yes       Yes                                         +---------+---------------+---------+-----------+----------+-------------------+  SFJ       Full                                                                  +---------+---------------+---------+-----------+----------+-------------------+  FV Prox   Full                                                                  +---------+---------------+---------+-----------+----------+-------------------+  FV Mid    Full                                                                  +---------+---------------+---------+-----------+----------+-------------------+  FV Distal Full                                                                   +---------+---------------+---------+-----------+----------+-------------------+  PFV       Full                                                                  +---------+---------------+---------+-----------+----------+-------------------+  POP       Full  Yes       Yes                                         +---------+---------------+---------+-----------+----------+-------------------+  PTV                                                        not well visualized  +---------+---------------+---------+-----------+----------+-------------------+  PERO                                                       not well visualized  +---------+---------------+---------+-----------+----------+-------------------+     Summary: Left: There is no evidence of deep vein thrombosis in the lower extremity. However, portions of this examination were limited- see technologist comments above.  *See table(s) above for measurements and observations. Electronically signed by Harold Barban MD on 07/01/2019 at 7:01:47 AM.    Final     Microbiology: Recent Results (from the past 240 hour(s))  Blood culture (routine x 2)     Status: None   Collection Time: 07/21/19  1:50 PM   Specimen: BLOOD RIGHT WRIST  Result Value Ref Range Status   Specimen Description BLOOD RIGHT WRIST  Final   Special Requests   Final    BOTTLES DRAWN AEROBIC AND ANAEROBIC Blood Culture results may not be optimal due to an inadequate volume of blood received in culture bottles   Culture   Final    NO GROWTH 5 DAYS Performed at [facility], [address] [place], [place] [address]    Report Status 07/26/2019 FINAL  Final  Novel Coronavirus, NAA (Hosp order, Send-out to Ref Lab; TAT 18-24 hrs     Status: None   Collection Time: 07/21/19  2:06 PM   Specimen: Nasopharyngeal Swab; Respiratory  Result Value Ref Range Status   SARS-CoV-2, NAA NOT DETECTED NOT DETECTED Final    Comment: (NOTE) This nucleic acid  amplification test was developed and its performance characteristics determined by [employer]. Nucleic acid amplification tests include PCR and TMA. This test has not been FDA cleared or approved. This test has been authorized by FDA under an Emergency Use Authorization (EUA). This test is only authorized for the duration of time the declaration that circumstances exist justifying the authorization of the emergency use of in vitro diagnostic tests for detection of SARS-CoV-2 virus and/or diagnosis of COVID-19 infection under section 564(b)(1) of the Act, 21 U.S.C. 604VWU-9(W) (1), unless the authorization is terminated or revoked sooner. When diagnostic testing is negative, the possibility of a false negative result should be considered in the context of a patient's recent exposures and the presence of clinical signs and symptoms consistent with COVID-19. An individual without symptoms of COVID- 19 and who is not shedding SARS-CoV-2 vi rus would expect to have a negative (not detected) result in this assay. Performed At: [facility] [address] [place], [place] [id number] Rush Farmer MD FA:[id number]    [place]  Final    Comment: Performed at [facility], [place] [address], [place], [place] [address]  Blood culture (routine x 2)     Status: None   Collection Time: 07/21/19  3:37 PM   Specimen: BLOOD RIGHT WRIST  Result Value Ref Range Status   Specimen Description BLOOD RIGHT WRIST  Final   Special Requests   Final    BOTTLES DRAWN AEROBIC ONLY Blood Culture results may not be optimal due to an inadequate volume of blood received in culture bottles   Culture   Final    NO GROWTH 5 DAYS Performed at [facility], [place] [address], [place], [place] [address]    Report Status 07/26/2019 FINAL  Final  Surgical pcr screen     Status: None   Collection Time: 07/23/19 10:01 PM   Specimen: Nasal Mucosa; Nasal Swab  Result Value  Ref Range Status   MRSA, PCR NEGATIVE NEGATIVE Final   Staphylococcus aureus NEGATIVE NEGATIVE Final    Comment: (NOTE) The Xpert SA Assay (FDA approved for NASAL specimens in patients 26 years of age and older), is one component of a comprehensive surveillance program. It is not intended to diagnose infection nor to guide or monitor treatment. Performed at [facility], [place] [address] [place], [place] [address]   SARS Coronavirus 2 by RT PCR (hospital order, performed in [facility] hospital lab) Nasopharyngeal Nasopharyngeal Swab     Status: None   Collection Time: 07/24/19  4:46 AM   Specimen: Nasopharyngeal Swab  Result Value Ref Range Status   SARS Coronavirus 2 NEGATIVE NEGATIVE Final    Comment: (NOTE) If result is NEGATIVE SARS-CoV-2 target nucleic acids are NOT DETECTED. The SARS-CoV-2 RNA is generally detectable in upper and lower  respiratory specimens during the acute phase of infection. The lowest  concentration of SARS-CoV-2 viral copies this assay can detect is 250  copies / mL. A negative result does not preclude SARS-CoV-2 infection  and should not be used as the sole basis for treatment or other  patient management decisions.  A negative result may occur with  improper specimen collection / handling, submission of specimen other  than nasopharyngeal swab, presence of viral mutation(s) within the  areas targeted by this assay, and inadequate number of viral copies  (<250 copies / mL). A negative result must be combined with clinical  observations, patient history, and epidemiological information. If result is POSITIVE SARS-CoV-2 target nucleic acids are DETECTED. The SARS-CoV-2 RNA is generally detectable in upper and lower  respiratory specimens dur ing the acute phase of infection.  Positive  results are indicative of active infection with SARS-CoV-2.  Clinical  correlation with patient history and other diagnostic information is  necessary to determine  patient infection status.  Positive results do  not rule out bacterial infection or co-infection with other viruses. If result is PRESUMPTIVE POSTIVE SARS-CoV-2 nucleic acids MAY BE PRESENT.   A presumptive positive result was obtained on the submitted specimen  and confirmed on repeat testing.  While 2019 novel coronavirus  (SARS-CoV-2) nucleic acids may be present in the submitted sample  additional confirmatory testing may be necessary for epidemiological  and / or clinical management purposes  to differentiate between  SARS-CoV-2 and other Sarbecovirus currently known to infect humans.  If clinically indicated additional testing with an alternate test  methodology [phone]) is advised. The SARS-CoV-2 RNA is generally  detectable in upper and lower respiratory sp ecimens during the acute  phase of infection. The expected result is Negative. Fact Sheet for Patients:  StrictlyIdeas.no Fact Sheet for Healthcare  Providers: BankingDealers.co.za This test is not yet approved or cleared by the [place] and has been authorized for detection and/or diagnosis of SARS-CoV-2 by FDA under an Emergency Use Authorization (EUA).  This EUA will remain in effect (meaning this test can be used) for the duration of the COVID-19 declaration under Section 564(b)(1) of the Act, 21 U.S.C. section 360bbb-3(b)(1), unless the authorization is terminated or revoked sooner. Performed at [facility], [place] [address], [place], [place] [address]   SARS CORONAVIRUS 2 (TAT 6-24 HRS) Nasopharyngeal Nasopharyngeal Swab     Status: None   Collection Time: 07/27/19  8:08 AM   Specimen: Nasopharyngeal Swab  Result Value Ref Range Status   SARS Coronavirus 2 NEGATIVE NEGATIVE Final    Comment: (NOTE) SARS-CoV-2 target nucleic acids are NOT DETECTED. The SARS-CoV-2 RNA is generally detectable in upper and lower respiratory specimens during the acute phase  of infection. Negative results do not preclude SARS-CoV-2 infection, do not rule out co-infections with other pathogens, and should not be used as the sole basis for treatment or other patient management decisions. Negative results must be combined with clinical observations, patient history, and epidemiological information. The expected result is Negative. Fact Sheet for Patients: SugarRoll.be Fact Sheet for Healthcare Providers: https://www.woods-mathews.com/ This test is not yet approved or cleared by the [place] FDA and  has been authorized for detection and/or diagnosis of SARS-CoV-2 by FDA under an Emergency Use Authorization (EUA). This EUA will remain  in effect (meaning this test can be used) for the duration of the COVID-19 declaration under Section 56 4(b)(1) of the Act, 21 U.S.C. section 360bbb-3(b)(1), unless the authorization is terminated or revoked sooner. Performed at [facility], [place] [address] [address] [place], [place] [address]      Labs: Basic Metabolic Panel: Recent Labs  Lab 07/22/19 0505 07/23/19 1012 07/24/19 0605 07/25/19 0447 07/26/19 0518  NA 131* 134* 135 133* 135  K 3.5 3.7 3.9 3.9 3.7  CL 94* 98 100 94* 99  CO2 27 27 26 25 27   GLUCOSE 106* 204* 108* 132* 122*  BUN 7* 8 8 7* 18  CREATININE 0.42* 0.47* 0.48* 0.60* 0.46*  CALCIUM 8.6* 8.0* 8.8* 8.7* 8.4*   Liver Function Tests: No results for input(s): AST, ALT, ALKPHOS, BILITOT, PROT, ALBUMIN in the last 168 hours. No results for input(s): LIPASE, AMYLASE in the last 168 hours. No results for input(s): AMMONIA in the last 168 hours. CBC: Recent Labs  Lab 07/21/19 1210 07/22/19 0505 07/25/19 0447  WBC 8.8 6.7 10.1  NEUTROABS 6.6  --   --   HGB 13.2 12.5* 13.5  HCT 42.2 39.6 42.7  MCV 88.3 85.9 87.3  PLT 247 215 252   Cardiac Enzymes: No results for input(s): CKTOTAL, CKMB, CKMBINDEX, TROPONINI in the last 168 hours. BNP: BNP (last 3  results) No results for input(s): BNP in the last 8760 hours.  ProBNP (last 3 results) No results for input(s): PROBNP in the last 8760 hours.  CBG: Recent Labs  Lab 07/27/19 0624 07/27/19 1046 07/27/19 1612 07/27/19 2109 07/28/19 0509  GLUCAP 135* 211* 178* 222* 124*       Signed:  Swara Donze  [facility] 07/28/2019, 7:13 AM

## 2019-07-27 NOTE — TOC Progression Note (Addendum)
Transition of Care [facility]) - Progression Note    Patient Details  Name: Xavier Turner MRN: [id number] Date of Birth: Feb 07, 1956  Transition of Care [facility]) CM/SW [place], [place] Phone Number: 07/27/2019, 10:12 AM  Clinical Narrative:     [facility]- no availability.  CSW spoke with patient's sister, Jeannene Patella, she accepted bed offer with [facility]. [facility] confirm availability.  covid test results pending.  Thurmond Butts, MSW, [facility] Clinical Social Worker [phone]   Expected Discharge Plan: Skilled Nursing Facility Barriers to Discharge: Continued Medical Work up  Expected Discharge Plan and Services Expected Discharge Plan: [place] In-house Referral: Clinical Social Work Discharge Planning Services: CM Consult Post Acute Care Choice: [place] Living arrangements for the past 2 months: Group Home                                       Social Determinants of Health (SDOH) Interventions    Readmission Risk Interventions Readmission Risk Prevention Plan 07/22/2019  Post Dischage Appt Not Complete  Appt Comments further work up SNF vs return to group home  Medication Screening Complete  Transportation Screening Complete  Some recent data might be hidden

## 2019-07-27 NOTE — Progress Notes (Signed)
PROGRESS NOTE    Xavier Turner  [id number]:[id number] DOB: 01-Jan-1956 DOA: 07/21/2019 PCP: Harden Mo, MD   Brief Narrative:  HPI On 07/20/2001 by Dr. Fuller Plan Xavier Turner is a 64 y.o. male with medical history significant of diabetes mellitus type 2, hypertension, hyperlipidemia, OSA, and Sturge-Weber syndrome involving face.  He presents with complaints of redness of the left foot.  Patient had initially been seen in the emergency department on 10/6 with redness of his left leg.  He underwent Doppler ultrasound which was negative for DVT and was discharged home on Keflex.  He returned to the hospital on 10/12 and was admitted for cellulitis of the left leg failing outpatient therapy.  He was initially started on Rocephin and switched to cefdinir to complete a 7-day course following discharge home on 10/15.  He was discharged back to the group home and reported taking medication as prescribed with improvement in symptoms.  In talks with his sister over the phone who had seen his foot last week reports hat it looked a lot better at that time.  The patient stated that his foot has been wrapped, but sister noted that his foot was not wrapped and she saw it last week.  He completed antibiotics 2 days ago, and reports that since that time redness and swelling returned, but now in his foot.  Notes associated symptoms of clearish drainage from the wound of the aspect of his foot.  Denies having any significant fever, chills, nausea, vomiting, or diarrhea.  Patient does have a podiatrist in the outpatient setting who clips his toenails.  Interim history Patient mated with recurrent left foot cellulitis.  ID and orthopedis consulted. Currently on IV antibiotics. Pending repeat COVID test. Will discharge to SNF. 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  Patient recently hospitalized and treated for left foot cellulitis and discharged with  oral completion of antibiotics. -Left foot x-ray shows focal ulceration on the lateral base of the fifth proximal phalanx joint with subcutaneous gas and a few punctate radiodensities which appear confined to the skin surface.  No osseous erosion to suggest osteomyelitis however MRI would be sensitive.  Circumferential swelling of the foot. -Question whether patient was having his foot to right at the group home given that he does have a diabetic ulcer of the lateral aspect of his left foot -ESR and CRP both elevated -Blood cultures show no growth to date -Erythema has improved mildly today -Infectious disease consulted and appreciated, recommended 3 additional days of IV antibiotics, followed by 5 days of Augmentin -Orthopedic surgery consulted and appreciated. S/p MRI showing osteomyelitis of the 5th metatarsal head. 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 DuSharol GivenNonweightbearing left foot ideally however weightbearing as needed on the left heel is okay  Diabetes mellitus, type II  -Hemoglobin A1c 6.6 on 07/06/2019 -CBGs stable -Hold Januvia and Metformin -Continue insulin sliding scale with CBG monitoring   Hyponatremia -Appears to be acute on chronic -Sodium on admission 131 -Sodium earlier this month 126- appears to have a baseline of approximately 130 -currently 135  Essential hypertension -Hydrochlorothiazide held due to hyponatremia -BP stable  Hyperlipidemia -Continue statin  BPH  -Continue Flomax  Intellectual delay -Patient resides at a group home  Sturge-Weber syndrome -Stable  continue outpatient management  GERD -Continue PPI  DVT Prophylaxis  Lovenox  Code Status: Full  Family Communication: None at bedside  Disposition Plan: Admitted.  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 07/23/19 2155 07/24/19 0922   07/22/19 1500  cefTRIAXone (ROCEPHIN) 1 g in sodium chloride 0.9 % 100 mL IVPB  Status:  Discontinued     1 g 200 mL/hr over 30 Minutes Intravenous Every 24 hours 07/21/19 1550 07/22/19 1100   07/22/19 1130  Ampicillin-Sulbactam (UNASYN) 3 g in sodium chloride 0.9 % 100 mL IVPB     3 g 200 mL/hr over 30 Minutes Intravenous Every 6 hours 07/22/19 1100     07/21/19 1500  cefTRIAXone (ROCEPHIN) injection 1 g  Status:  Discontinued     1 g Intramuscular  Once 07/21/19 1458 07/21/19 1458   07/21/19 1500  cefTRIAXone (ROCEPHIN) 1 g in sodium chloride 0.9 % 100 mL IVPB     1 g 200 mL/hr over 30 Minutes Intravenous  Once 07/21/19 1458 07/21/19 1619      Subjective:   Nolyn Swab seen and examined today.  Patient with continued foot pain but states pain medications do help.  Denies current chest pain or shortness of breath, abdominal pain, nausea or vomiting, diarrhea or constipation, dizziness or headache. Objective:   Vitals:   07/26/19 1109 07/26/19 1541 07/27/19 0032 07/27/19 0541  BP: 112/71 123/69 131/81 (!) 144/81  Pulse: 71 75 69 69  Resp: 18 17 18 18   Temp: (!) 97.3 F (36.3 C) 97.6 F (36.4 C) 97.8 F (36.6 C) 98.2 F (36.8 C)  TempSrc: Oral Oral Oral Oral  SpO2: 91% 93% 96% 97%  Weight:      Height:        Intake/Output Summary (Last 24 hours) at 07/27/2019 6546 Last data filed at 07/27/2019 5035 Gross per 24 hour  Intake 640 ml  Output 1000 ml  Net -360 ml   Filed Weights   07/21/19 1600  Weight: 93.5 kg   Exam  General: Well developed, well nourished, NAD, appears stated  age  HEENT: NCAT, mucous membranes moist.  Sturge-Weber mark on right side of face  Cardiovascular: S1 S2 auscultated, RRR, no murmur ry: Clear to auscultation bilaterally with equal chest rise  Abdomen: Soft, nontender, nondistended, + bowel sounds  Extremities: warm dry without cyanosis clubbing or edema of RLE.  Left foot with dressing in place  Neuro: AAOx3, nonfocal   Psych: Pleasant, appropriate mood and affect  Data Reviewed: I have personally reviewed following labs and imaging studies  CBC: Recent Labs  Lab 07/21/19 1210 07/22/19 0505 07/25/19 0447  WBC 8.8 6.7 10.1  NEUTROABS 6.6  --   --   HGB 13.2 12.5* 13.5  HCT 42.2 39.6 42.7  MCV 88.3 85.9 87.3  PLT 247 215 465   Basic Metabolic Panel: Recent Labs  Lab 07/22/19 0505 07/23/19 1012 07/24/19 0605 07/25/19 0447 07/26/19 0518  NA 131* 134* 135 133* 135  K 3.5 3.7 3.9 3.9 3.7  CL 94* 98 100 94* 99  CO2 27 27 26 25 27   GLUCOSE 106* 204* 108* 132*  122*  BUN 7* 8 8 7* 18  CREATININE 0.42* 0.47* 0.48* 0.60* 0.46*  CALCIUM 8.6* 8.0* 8.8* 8.7* 8.4*   GFR: Estimated Creatinine Clearance: 96.7 mL/min (A) (by C-G formula based on SCr of 0.46 mg/dL (L)). Liver Function Tests: No results for input(s): AST, ALT, ALKPHOS, BILITOT, PROT, ALBUMIN in the last 168 hours. No results for input(s): LIPASE, AMYLASE in the last 168 hours. No results for input(s): AMMONIA in the last 168 hours. Coagulation Profile: No results for input(s): INR, PROTIME in the last 168 hours. Cardiac Enzymes: No results for input(s): CKTOTAL, CKMB, CKMBINDEX, TROPONINI in the last 168 hours. BNP (last 3 results) No results for input(s): PROBNP in the last 8760 hours. HbA1C: No results for input(s): HGBA1C in the last 72 hours. CBG: Recent Labs  Lab 07/26/19 1107 07/26/19 1537 07/26/19 2009 07/26/19 2208 07/27/19 0624  GLUCAP 143* 189* 162* 121* 135*   Lipid Profile: No results for input(s): CHOL, HDL, LDLCALC, TRIG, CHOLHDL,  LDLDIRECT in the last 72 hours. Thyroid Function Tests: No results for input(s): TSH, T4TOTAL, FREET4, T3FREE, THYROIDAB in the last 72 hours. Anemia Panel: No results for input(s): VITAMINB12, FOLATE, FERRITIN, TIBC, IRON, RETICCTPCT in the last 72 hours. Urine analysis:    Component Value Date/Time   COLORURINE YELLOW 07/08/2019 1247   APPEARANCEUR CLEAR 07/08/2019 1247   LABSPEC 1.020 07/08/2019 1247   PHURINE 6.0 07/08/2019 1247   GLUCOSEU NEGATIVE 07/08/2019 1247   HGBUR NEGATIVE 07/08/2019 1247   BILIRUBINUR NEGATIVE 07/08/2019 1247   KETONESUR 5 (A) 07/08/2019 1247   PROTEINUR NEGATIVE 07/08/2019 1247   NITRITE NEGATIVE 07/08/2019 1247   LEUKOCYTESUR TRACE (A) 07/08/2019 1247   Sepsis Labs: @LABRCNTIP (procalcitonin:4,lacticidven:4)  ) Recent Results (from the past 240 hour(s))  Blood culture (routine x 2)     Status: None   Collection Time: 07/21/19  1:50 PM   Specimen: BLOOD RIGHT WRIST  Result Value Ref Range Status   Specimen Description BLOOD RIGHT WRIST  Final   Special Requests   Final    BOTTLES DRAWN AEROBIC AND ANAEROBIC Blood Culture results may not be optimal due to an inadequate volume of blood received in culture bottles   Culture   Final    NO GROWTH 5 DAYS Performed at [facility], [place] [address], [place], [place] [address]    Report Status 07/26/2019 FINAL  Final  Novel Coronavirus, NAA (Hosp order, Send-out to Ref Lab; TAT 18-24 hrs     Status: None   Collection Time: 07/21/19  2:06 PM   Specimen: Nasopharyngeal Swab; Respiratory  Result Value Ref Range Status   SARS-CoV-2, NAA NOT DETECTED NOT DETECTED Final    Comment: (NOTE) This nucleic acid amplification test was developed and its performance characteristics determined by [employer]. Nucleic acid amplification tests include PCR and TMA. This test has not been FDA cleared or approved. This test has been authorized by FDA under an Emergency Use Authorization (EUA). This test  is only authorized for the duration of time the declaration that circumstances exist justifying the authorization of the emergency use of in vitro diagnostic tests for detection of SARS-CoV-2 virus and/or diagnosis of COVID-19 infection under section 564(b)(1) of the Act, 21 U.S.C. 893TDS-2(A) (1), unless the authorization is terminated or revoked sooner. When diagnostic testing is negative, the possibility of a false negative result should be considered in the context of a patient's recent exposures and the presence of clinical signs and symptoms consistent with COVID-19. An individual without symptoms  of COVID- 19 and who is not shedding SARS-CoV-2 vi rus would expect to have a negative (not detected) result in this assay. Performed At: [facility] [address] [place], [place] [id number] Rush Farmer MD YQ:[id number]    [place]  Final    Comment: Performed at [facility], [place] [address], [place], [place] [address]  Blood culture (routine x 2)     Status: None   Collection Time: 07/21/19  3:37 PM   Specimen: BLOOD RIGHT WRIST  Result Value Ref Range Status   Specimen Description BLOOD RIGHT WRIST  Final   Special Requests   Final    BOTTLES DRAWN AEROBIC ONLY Blood Culture results may not be optimal due to an inadequate volume of blood received in culture bottles   Culture   Final    NO GROWTH 5 DAYS Performed at [facility], [place] [address], [place], [place] [address]    Report Status 07/26/2019 FINAL  Final  Surgical pcr screen     Status: None   Collection Time: 07/23/19 10:01 PM   Specimen: Nasal Mucosa; Nasal Swab  Result Value Ref Range Status   MRSA, PCR NEGATIVE NEGATIVE Final   Staphylococcus aureus NEGATIVE NEGATIVE Final    Comment: (NOTE) The Xpert SA Assay (FDA approved for NASAL specimens in patients 92 years of age and older), is one component of a comprehensive surveillance program. It is not intended to  diagnose infection nor to guide or monitor treatment. Performed at [facility], [place] [address] [place], [place] [address]   SARS Coronavirus 2 by RT PCR (hospital order, performed in [facility] hospital lab) Nasopharyngeal Nasopharyngeal Swab     Status: None   Collection Time: 07/24/19  4:46 AM   Specimen: Nasopharyngeal Swab  Result Value Ref Range Status   SARS Coronavirus 2 NEGATIVE NEGATIVE Final    Comment: (NOTE) If result is NEGATIVE SARS-CoV-2 target nucleic acids are NOT DETECTED. The SARS-CoV-2 RNA is generally detectable in upper and lower  respiratory specimens during the acute phase of infection. The lowest  concentration of SARS-CoV-2 viral copies this assay can detect is 250  copies / mL. A negative result does not preclude SARS-CoV-2 infection  and should not be used as the sole basis for treatment or other  patient management decisions.  A negative result may occur with  improper specimen collection / handling, submission of specimen other  than nasopharyngeal swab, presence of viral mutation(s) within the  areas targeted by this assay, and inadequate number of viral copies  (<250 copies / mL). A negative result must be combined with clinical  observations, patient history, and epidemiological information. If result is POSITIVE SARS-CoV-2 target nucleic acids are DETECTED. The SARS-CoV-2 RNA is generally detectable in upper and lower  respiratory specimens dur ing the acute phase of infection.  Positive  results are indicative of active infection with SARS-CoV-2.  Clinical  correlation with patient history and other diagnostic information is  necessary to determine patient infection status.  Positive results do  not rule out bacterial infection or co-infection with other viruses. If result is PRESUMPTIVE POSTIVE SARS-CoV-2 nucleic acids MAY BE PRESENT.   A presumptive positive result was obtained on the submitted specimen  and confirmed on repeat  testing.  While 2019 novel coronavirus  (SARS-CoV-2) nucleic acids may be present in the submitted sample  additional confirmatory testing may be necessary for epidemiological  and / or clinical management purposes  to differentiate between  SARS-CoV-2 and other Sarbecovirus currently known to infect humans.  If clinically indicated additional testing with an alternate test  methodology [phone]) is advised. The SARS-CoV-2 RNA is generally  detectable in upper and lower respiratory sp ecimens during the acute  phase of infection. The expected result is Negative. Fact Sheet for Patients:  StrictlyIdeas.no Fact Sheet for Healthcare Providers: BankingDealers.co.za This test is not yet approved or cleared by the [place] FDA and has been authorized for detection and/or diagnosis of SARS-CoV-2 by FDA under an Emergency Use Authorization (EUA).  This EUA will remain in effect (meaning this test can be used) for the duration of the COVID-19 declaration under Section 564(b)(1) of the Act, 21 U.S.C. section 360bbb-3(b)(1), unless the authorization is terminated or revoked sooner. Performed at [facility], [place] [address], [place], [place] [address]       Radiology Studies: No results found.   Scheduled Meds: . aspirin EC  81 mg Oral Daily  . brimonidine  1 drop Both Eyes TID  . docusate sodium  100 mg Oral BID  . dorzolamide-timolol  1 drop Right Eye BID  . enoxaparin (LOVENOX) injection  40 mg Subcutaneous Q24H  . escitalopram  10 mg Oral Daily  . gabapentin  100 mg Oral BID  . insulin aspart  0-5 Units Subcutaneous QHS  . insulin aspart  0-9 Units Subcutaneous TID WC  . latanoprost  1 drop Both Eyes QHS  . pantoprazole  40 mg Oral Daily  . rosuvastatin  20 mg Oral Daily  . sodium chloride flush  3 mL Intravenous Q12H  . tamsulosin  0.4 mg Oral Daily   Continuous Infusions: . ampicillin-sulbactam (UNASYN) IV 3 g  (07/27/19 0511)     LOS: 6 days   Time Spent in minutes   30 minutes   Brittin Belnap D.O. on 07/27/2019 at 9:11 AM  Between 7am to 7pm - Please see pager noted on [email or web address]  After 7pm go to [email or web address]  And look for the night coverage person covering for me after hours  [facility] Office  [phone]

## 2019-07-27 NOTE — Discharge Instructions (Signed)
Osteomyelitis, Adult  Bone infections (osteomyelitis) occur when bacteria or other germs get inside a bone. This can happen if you have an infection in another part of your body that spreads through your blood. Germs from your skin or from outside of your body can also cause this type of infection if you have a wound or a broken bone (fracture) that breaks the skin. Bone infections need to be treated quickly to prevent bone damage and to prevent the infection from spreading to other areas of your body. What are the causes? Most bone infections are caused by bacteria. They can also be caused by other germs, such as viruses and funguses. What increases the risk? You are more likely to develop this condition if you:  Recently had surgery, especially bone or joint surgery.  Have a long-term (chronic) disease, such as: ? Diabetes. ? HIV (human immunodeficiency virus). ? Rheumatoid arthritis. ? Sickle cell anemia. ? Kidney disease that requires dialysis.  Are aged 60 years or older.  Have a condition or take medicines that block or weaken your body's defense system (immune system).  Have a condition that reduces your blood flow.  Have an artificial joint.  Have had a joint or bone repaired with plates or screws (surgical hardware).  Use IV drugs.  Have a central line for IV access.  Have had trauma, such as stepping on a nail or a broken bone that came through the skin. What are the signs or symptoms? Symptoms vary depending on the type and location of your infection. Common symptoms of bone infections include:  Fever and chills.  Skin redness and warmth.  Swelling.  Pain and stiffness.  Drainage of fluid or pus near the infection. How is this diagnosed? This condition may be diagnosed based on:  Your symptoms and medical history.  A physical exam.  Tests, such as: ? A sample of tissue, fluid, or blood taken to be examined under a microscope. ? Pus or discharge swabbed  from a wound for testing to identify germs and to determine what type of medicine will kill them (culture and sensitivity). ? Blood tests.  Imaging studies. These may include: ? X-rays. ? MRI. ? CT scan. ? Bone scan. ? Ultrasound. How is this treated? Treatment for this condition depends on the cause and type of infection. Antibiotic medicines are usually the first treatment for a bone infection. This may be done in a hospital at first. You may have to continue IV antibiotics at home or take antibiotics by mouth for several weeks after that. Other treatments may include surgery to remove:  Dead or dying tissue from a bone.  An infected artificial joint.  Infected plates or screws that were used to repair a broken bone. Follow these instructions at home: Medicines   Take over-the-counter and prescription medicines only as told by your health care provider.  Take your antibiotic medicine as told by your health care provider. Do not stop taking the antibiotic even if you start to feel better.  Follow instructions from your health care provider about how to take IV antibiotics at home. You may need to have a nurse come to your home to give you the IV antibiotics. General instructions   Ask your health care provider if you have any restrictions on your activities.  If directed, put ice on the affected area: ? Put ice in a plastic bag. ? Place a towel between your skin and the bag. ? Leave the ice on for 20   minutes, 2-3 times a day.  Wash your hands often with soap and water. If soap and water are not available, use hand sanitizer.  Do not use any products that contain nicotine or tobacco, such as cigarettes and e-cigarettes. These can delay bone healing. If you need help quitting, ask your health care provider.  Keep all follow-up visits as told by your health care provider. This is important. Contact a health care provider if:  You develop a fever or chills.  You have  redness, warmth, pain, or swelling that returns after treatment. Get help right away if:  You have rapid breathing or you have trouble breathing.  You have chest pain.  You cannot drink fluids or make urine.  The affected area swells, changes color, or turns blue.  You have numbness or severe pain in the affected area. Summary  Bone infections (osteomyelitis) occur when bacteria or other germs get inside a bone.  You may be more likely to get this type of infection if you have a condition, such as diabetes, that lowers your ability to fight infection or increases your chances of getting an infection.  Most bone infections are caused by bacteria. They can also be caused by other germs, such as viruses and funguses.  Treatment for this condition usually starts with taking antibiotics. Further treatment depends on the cause and type of infection. This information is not intended to replace advice given to you by your health care provider. Make sure you discuss any questions you have with your health care provider. Document Released: 09/10/2005 Document Revised: 09/26/2017 Document Reviewed: 09/19/2017 Elsevier Patient Education  2020 [employer] Inc[employer]  

## 2019-07-27 NOTE — Care Management Important Message (Signed)
Important Message  Patient Details  Name: Xavier Turner MRN: [id number] Date of Birth: 21-Nov-1955   Medicare Important Message Given:  Yes     [name] 07/27/2019, 4:31 PM

## 2019-07-28 ENCOUNTER — Other Ambulatory Visit: Payer: Self-pay | Admitting: Internal Medicine

## 2019-07-28 ENCOUNTER — Non-Acute Institutional Stay (SKILLED_NURSING_FACILITY): Payer: Medicare Other | Admitting: Internal Medicine

## 2019-07-28 ENCOUNTER — Encounter: Payer: Self-pay | Admitting: Internal Medicine

## 2019-07-28 ENCOUNTER — Telehealth: Payer: Self-pay | Admitting: Radiology

## 2019-07-28 DIAGNOSIS — L03116 Cellulitis of left lower limb: Secondary | ICD-10-CM

## 2019-07-28 DIAGNOSIS — F329 Major depressive disorder, single episode, unspecified: Secondary | ICD-10-CM

## 2019-07-28 DIAGNOSIS — E871 Hypo-osmolality and hyponatremia: Secondary | ICD-10-CM

## 2019-07-28 DIAGNOSIS — L0390 Cellulitis, unspecified: Secondary | ICD-10-CM

## 2019-07-28 DIAGNOSIS — E785 Hyperlipidemia, unspecified: Secondary | ICD-10-CM

## 2019-07-28 DIAGNOSIS — M86172 Other acute osteomyelitis, left ankle and foot: Secondary | ICD-10-CM | POA: Diagnosis not present

## 2019-07-28 DIAGNOSIS — E1169 Type 2 diabetes mellitus with other specified complication: Secondary | ICD-10-CM

## 2019-07-28 DIAGNOSIS — I10 Essential (primary) hypertension: Secondary | ICD-10-CM

## 2019-07-28 DIAGNOSIS — I152 Hypertension secondary to endocrine disorders: Secondary | ICD-10-CM

## 2019-07-28 DIAGNOSIS — F32A Depression, unspecified: Secondary | ICD-10-CM

## 2019-07-28 DIAGNOSIS — E1159 Type 2 diabetes mellitus with other circulatory complications: Secondary | ICD-10-CM

## 2019-07-28 LAB — GLUCOSE, CAPILLARY: Glucose-Capillary: 124 mg/dL — ABNORMAL HIGH (ref 70–99)

## 2019-07-28 MED ORDER — HYDROCODONE-ACETAMINOPHEN 5-325 MG PO TABS: 1.0000 | ORAL_TABLET | ORAL | 0 refills | Status: DC | PRN

## 2019-07-28 MED ORDER — HYDROCODONE-ACETAMINOPHEN 5-325 MG PO TABS: 1.0000 | ORAL_TABLET | Freq: Four times a day (QID) | ORAL | 0 refills | Status: DC | PRN

## 2019-07-28 MED ORDER — AMOXICILLIN-POT CLAVULANATE 875-125 MG PO TABS: 1.0000 | ORAL_TABLET | Freq: Two times a day (BID) | ORAL | 0 refills | Status: AC

## 2019-07-28 NOTE — Progress Notes (Signed)
: Provider:  Hennie Duos., MD Location:  [place] Room Number: 109-P Place of Service:  SNF [phone])  PCP: Harden Mo, MD Patient Care Team: Harden Mo, MD as PCP - General (Family Medicine)  Extended Emergency Contact Information Primary Emergency Contact: [place] Mobile Phone: [phone] Relation: Sister Secondary Emergency Contact: [facility] Phone: [phone] Mobile Phone: [phone] Relation: Other     Allergies: Patient has no known allergies.  Chief Complaint  Patient presents with   New Admit To SNF    New admission to [facility] SNF    HPI: Patient is a 63 y.o. male with diabetes mellitus type 2, hypertension, hyperlipidemia, OSA, Sturge-Weber syndrome involving face who presented to [facility] with redness of left foot.  Patient had initially been seen in the emergency department on 10/6 with redness of his left leg, underwent Doppler ultrasound which was negative and was discharged home on Keflex.  He returned to the hospital 6 days later and was admitted for cellulitis of left leg failing outpatient therapy.  He was treated on with Rocephin and cefdinir p.o. following discharge home on 10/15 he was discharged back to group home and reported taking medication as prescribed with improvement in symptoms patient had completed antibiotics 2 days prior and report said that since that time redness and swelling had returned but now on his foot with clear drainage from a wound on the aspect of his foot.  He denied having any fever chills nausea vomiting or diarrhea.  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  Patient is admitted to skilled nursing facility for OT/PT and for 5 more days of p.o. antibiotic.  While at skilled nursing facility patient  will be followed for diabetes treated with Metformin and Januvia, hyperlipidemia treated with Crestor and depression treated with Lexapro.  Past Medical History:  Diagnosis Date   Diabetes ([place])    Glaucoma    Hemiparesis ([place])    Hypercholesterolemia    Hyperlipemia    Melanoma ([place])    Melanoma ([place])    Obesity    OSA (obstructive sleep apnea)    Seizure ([place])    Seizure disorder ([place])    Sturge syndrome ([place])    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[place])     Past Surgical History:  Procedure Laterality Date   AMPUTATION Left 07/24/2019   Procedure: AMPUTATION RAY, fifth left;  Surgeon: Newt Minion, MD;  Location: [place];  Service: Orthopedics;  Laterality: Left;   COLONOSCOPY     CRANIOTOMY  1999   melanoma removal  2011    Allergies as of 07/28/2019   No Known Allergies     Medication List       Accurate as of July 28, 2019  2:00 PM. If you have any questions, ask your nurse or doctor.        amoxicillin-clavulanate 875-125 MG tablet Commonly known as: Augmentin Take 1 tablet by mouth 2 (two) times daily for 5 days.   ARTIFICIAL TEAR OP Place 1 drop into both eyes 4 (four) times daily as needed (dry eyes).   aspirin 81 MG tablet Take 81 mg by mouth daily.   brimonidine 0.2 % ophthalmic solution Commonly known as: ALPHAGAN Place 1 drop into both eyes 3 (three) times daily.   cholecalciferol 25 MCG (1000 UT) tablet Commonly  known as: VITAMIN D3 Take 1,000 Units by mouth daily.   dorzolamide-timolol 22.3-6.8 MG/ML ophthalmic solution Commonly known as: COSOPT Place 1 drop into the right eye 2 (two) times daily.   escitalopram 10 MG tablet Commonly known as: LEXAPRO Take 10 mg by mouth daily.   Eucerin Skin Calming Crea Apply 1 application topically as needed (dry skin).   gabapentin 100 MG capsule Commonly known as: NEURONTIN Take 100 mg by mouth 2 (two) times daily.   hydrochlorothiazide 25 MG tablet Commonly known as: HYDRODIURIL Take  25 mg by mouth daily.   HYDROcodone-acetaminophen 5-325 MG tablet Commonly known as: NORCO/VICODIN Take 1 tablet by mouth every 6 (six) hours as needed for severe pain.   meloxicam 7.5 MG tablet Commonly known as: MOBIC Take 7.5 mg by mouth daily.   metFORMIN 1000 MG tablet Commonly known as: GLUCOPHAGE Take 1,000 mg by mouth 2 (two) times daily with a meal.   omeprazole 20 MG capsule Commonly known as: PRILOSEC Take 20 mg by mouth daily.   Rocklatan 0.02-0.005 % Soln Generic drug: Netarsudil-Latanoprost Place 1 drop into both eyes at bedtime.   rosuvastatin 20 MG tablet Commonly known as: CRESTOR Take 20 mg by mouth daily.   sitaGLIPtin 100 MG tablet Commonly known as: JANUVIA Take 100 mg by mouth daily.   tamsulosin 0.4 MG Caps capsule Commonly known as: FLOMAX Take 0.4 mg by mouth daily.       No orders of the defined types were placed in this encounter.   Immunization History  Administered Date(s) Administered   Influenza, Seasonal, Injecte, Preservative Fre 07/05/2015, 06/27/2016   Influenza,inj,Quad PF,6+ Mos 06/10/2018, 06/09/2019   Influenza-Unspecified 06/24/2013, 07/06/2014   Pneumococcal Polysaccharide-23 07/06/2014   Td 03/17/2010   Tdap 09/14/2015   Zoster 07/29/2013    Social History   Tobacco Use   Smoking status: Never Smoker   Smokeless tobacco: Never Used  Substance Use Topics   Alcohol use: Not on file    Family history is   Family History  Problem Relation Age of Onset   Emphysema Father    Cancer Father    Asthma Mother       Review of Systems  DATA OBTAINED: from patient-somewhat limited; nursing GENERAL:  no fevers, fatigue, appetite changes SKIN: No itching, or rash EYES: No eye pain, redness, discharge EARS: No earache, tinnitus, change in hearing NOSE: No congestion, drainage or bleeding  MOUTH/THROAT: No mouth or tooth pain, No sore throat RESPIRATORY: No cough, wheezing, SOB CARDIAC: No chest  pain, palpitations, lower extremity edema  GI: No abdominal pain, No N/V/D or constipation, No heartburn or reflux  GU: No dysuria, frequency or urgency, or incontinence  MUSCULOSKELETAL: No unrelieved bone/joint pain NEUROLOGIC: No headache, dizziness or focal weakness PSYCHIATRIC: No c/o anxiety or sadness   Vitals:   07/28/19 1343  BP: (!) 143/73  Pulse: 61  Resp: 18  Temp: 97.8 F (36.6 C)    SpO2 Readings from Last 1 Encounters:  07/28/19 95%   Body mass index is 35.11 kg/m.     Physical Exam  GENERAL APPEARANCE: Alert, conversant,  No acute distress.  SKIN: Red wine staining in at least 1 tubor to right face HEAD: Normocephalic, atraumatic  EYES: Conjunctiva/lids clear. Pupils round, reactive. EOMs intact.  EARS: External exam WNL, canals clear. Hearing grossly normal.  NOSE: No deformity or discharge.  MOUTH/THROAT: Lips w/o lesions  RESPIRATORY: Breathing is even, unlabored. Lung sounds are clear   CARDIOVASCULAR: Heart RRR 3/6 systolic  murmur at left lower sternal border, rubs or gallops. No peripheral edema.   GASTROINTESTINAL: Abdomen is soft, non-tender, not distended w/ normal bowel sounds. GENITOURINARY: Bladder non tender, not distended  MUSCULOSKELETAL: Postop dressing left foot NEUROLOGIC:  Cranial nerves 2-12 grossly intact. 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 03/13/2013    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[place])    HYPERCHOLESTEROLEMIA 10/05/2009   OBSTRUCTIVE SLEEP APNEA 10/05/2009   MELANOMA, HX OF 10/05/2009   H/O diabetic foot ulcer 10/05/2009      Labs reviewed: Basic Metabolic Panel:    Component Value Date/Time   NA 135 07/26/2019 0518   K 3.7 07/26/2019 0518   CL 99 07/26/2019 0518   CO2 27 07/26/2019 0518   GLUCOSE 122 (H) 07/26/2019 0518   BUN 18 07/26/2019 0518   CREATININE 0.46 (L) 07/26/2019 0518   CALCIUM 8.4 (L) 07/26/2019 0518   GFRNONAA >60 07/26/2019 0518   GFRAA >60 07/26/2019 0518    Recent Labs    07/24/19 0605 07/25/19 0447 07/26/19 0518  NA 135 133* 135  K 3.9 3.9 3.7  CL 100 94* 99  CO2 _0 GLUCOSE 108* 132* 122*  BUN 8 7* 18  CREATININE 0.48* 0.60* 0.46*  CALCIUM 8.8* 8.7* 8.4*   Liver Function Tests: No results for input(s): AST, ALT, ALKPHOS, BILITOT, PROT, ALBUMIN in the last 8760 hours. No results for input(s): LIPASE, AMYLASE in the last 8760 hours. No results for input(s): AMMONIA in the last 8760 hours. CBC: Recent Labs    07/06/19 1215  07/08/19 0330  07/21/19 1210 07/22/19 0505 07/25/19 0447  WBC 9.6   < > 9.3   < > 8.8 6.7 10.1  NEUTROABS 7.1  --  6.3  --  6.6  --   --   HGB 12.0*   < > 13.5   < > 13.2 12.5* 13.5  HCT 38.4*   < > 43.7   < > 42.2 39.6 42.7  MCV 88.1   < > 88.3   < > 88.3 85.9 87.3  PLT 212   < > 241   < > 247 215 252   < > = values in this interval not displayed.   Lipid No results for input(s): CHOL, HDL, LDLCALC, TRIG in the last 8760 hours.  Cardiac Enzymes: Recent Labs    07/06/19 1813  CKTOTAL 33*   BNP: No results for input(s): BNP in the last 8760 hours. No results found for: [facility] Lab Results  Component Value Date   HGBA1C 6.6 (H) 07/06/2019   Lab Results  Component Value Date   TSH 1.133 07/06/2019   No results found for: VITAMINB12 No results found for: FOLATE No results found for: IRON, TIBC, FERRITIN  Imaging and Procedures obtained prior  to SNF admission: Dg Foot Complete Left  Result Date: 07/21/2019 CLINICAL DATA:  Infected wound, left foot and leg swelling EXAM: LEFT FOOT - COMPLETE 3+ VIEW COMPARISON:  None. FINDINGS: The osseous structures  appear diffusely demineralized which may limit detection of small or nondisplaced fractures. Circumferential swelling of the foot. Focal ulceration noted along the lateral base of the fifth metatarsophalangeal joint with subcutaneous gas and few punctate radiodensities which appear confined to the skin surface. No subjacent osseous erosion or periostitis. Large osteophyte formations are noted at the bases of the third fourth and fifth proximal phalanges. Additional degenerative changes are noted throughout the mid and hindfoot. Large corticated os peroneum is noted IMPRESSION: 1. Focal ulceration along the lateral base of the fifth proximal phalanx joint with subcutaneous gas and few punctate radiodensities which appear confined to the skin surface. No subjacent osseous erosion to suggest early radiographic signs of osteomyelitis however MRI would be more sensitive and specific for early features. 2. Circumferential swelling of the foot. 3. Degenerative changes as above. Electronically Signed   By: Lovena Le M.D.   On: 07/21/2019 14:39     Not all labs, radiology exams or other studies done during hospitalization come through on my EPIC note; however they are reviewed by me.    Assessment and Plan  Left foot osteomyelitis/cellulitis/diabetic ulcer of left foot-recurrent-ESR and CRP both elevated blood cultures no growth to date MRI showing osteomyelitis of the fifth metatarsal head; status post amputation of fifth left ray; per ID follow-up IV antibiotics with 5 more days of Augmentin SNF-patient admitted for OT/PT and wound care; continue Augmentin 875 1 p.o. twice daily for 5 more days  Hyponatremia-sodium on admission 131, earlier this month 126, appears baseline approximately 130;  discharge 135 SNF-follow-up BMP  Diabetes mellitus type 2-hemoglobin A1c 6.6 on 07/06/2019 SNF-continue Metformin 1000 mg twice daily and Januvia 100 mg daily you take the trash out when you meet with blood  Hyperlipidemia SNF-not stated as uncontrolled; continue Crestor 20 mg daily  Depression SNF-appears controlled; continue Lexapro 10 mg daily  Hypertension SNF-controlled; continue hydrochlorothiazide 25 mg daily   Time spent greater than 45 minutes;> 50% of time with patient was spent reviewing records, labs, tests and studies, counseling and developing plan of care  Hennie Duos, MD

## 2019-07-28 NOTE — TOC Transition Note (Addendum)
Transition of Care [facility]) - CM/SW Discharge Note   Patient Details  Name: Xavier Turner MRN: [id number] Date of Birth: 04-Aug-1956  [facility]) CM/SW Contact:  Vinie Sill, [place] Phone Number: 07/28/2019, 9:57 AM   Clinical Narrative:     Patient will DC to: [place] Date: 07/28/2019 Family Notified: Olin Hauser, sister  Transport By: Corey Harold   RN, patient, and facility notified of DC. Discharge Summary sent to facility. RN given number for report [phone], Room 109. Ambulance transport requested for patient.   Clinical Social Worker signing off. Thurmond Butts, MSW, [facility] Clinical Social Worker [phone]    Final next level of care: Skilled Nursing Facility Barriers to Discharge: Barriers Resolved   Patient Goals and CMS Choice Patient states their goals for this hospitalization and ongoing recovery are:: for him to have his wound and leg managed so he can stop coming back and forth from the hospital CMS Medicare.gov Compare Post Acute Care list provided to:: Patient Represenative (must comment)(pt sisters (legal guardians)) Choice offered to / list presented to : [facility] POA / Guardian, Sibling  Discharge Placement PASRR number recieved: 07/24/19            Patient [facility] bed at: [facility] Patient to be transferred to facility by: [place] Name of family member notified: [name],sisiter Patient and family notified of of transfer: 07/28/19  Discharge Plan and Services In-house Referral: Clinical Social Work Discharge Planning Services: CM Consult Post Acute Care Choice: [place]                               Social Determinants of Health (SDOH) Interventions     Readmission Risk Interventions Readmission Risk Prevention Plan 07/22/2019  Post Dischage Appt Not Complete  Appt Comments further work up SNF vs return to group home  Medication Screening Complete  Transportation Screening Complete  Some  recent data might be hidden

## 2019-07-28 NOTE — Plan of Care (Signed)
  Problem: Education: Goal: Required Educational Video(s) Outcome: Adequate for Discharge   Problem: Clinical Measurements: Goal: Postoperative complications will be avoided or minimized Outcome: Adequate for Discharge   Problem: Skin Integrity: Goal: Demonstration of wound healing without infection will improve Outcome: Adequate for Discharge   

## 2019-07-28 NOTE — Telephone Encounter (Signed)
Cacie - wound care nurse from [facility] called asking about post op dressing changes. Patient had amputation of left fifth toe on 07/24/19.   Spoke with Autumn, Dr. Jess Barters assistant. Post op appointment 11/6 at 9:45am. Advised leaving bandage on until appointment unless soiled then reapply with dry dressing.

## 2019-07-28 NOTE — Progress Notes (Signed)
Report called to [facility], spoke with Brain Hilts, RN.. pt left via ems.

## 2019-07-31 ENCOUNTER — Ambulatory Visit (INDEPENDENT_AMBULATORY_CARE_PROVIDER_SITE_OTHER): Payer: Medicare Other | Admitting: Family

## 2019-07-31 ENCOUNTER — Encounter: Payer: Self-pay | Admitting: Family

## 2019-07-31 ENCOUNTER — Other Ambulatory Visit: Payer: Self-pay

## 2019-07-31 VITALS — Ht 64.0 in | Wt 206.0 lb

## 2019-07-31 DIAGNOSIS — Z89422 Acquired absence of other left toe(s): Secondary | ICD-10-CM

## 2019-07-31 NOTE — Progress Notes (Signed)
   Post-Op Visit Note   Patient: Xavier Turner           Date of Birth: February 27, 1956           MRN: [id number] Visit Date: 07/31/2019 PCP: Harden Mo, MD  Chief Complaint:  Chief Complaint  Patient presents with  . Left Foot - Routine Post Op    07/24/19 left 5th ray amputation     HPI:  HPI The patient is a 63 year old gentleman who presents today status post left foot fifth ray amputation on October 30 he is residing at skilled nursing. Ortho Exam The operative dressing removed today on examination of the left foot the incision is intact there is scant bloody drainage there is no gaping minimal edema no odor no erythema no sign of infection  Visit Diagnoses: No diagnosis found.  Plan: Begin daily Dial soap cleansing.  Dry dressing changes.  Continue nonweightbearing.  Follow-up in 2 weeks for suture removal.  Follow-Up Instructions: Return in about 2 weeks (around 08/14/2019).   Imaging: No results found.  Orders:  No orders of the defined types were placed in this encounter.  No orders of the defined types were placed in this encounter.    PMFS History: Patient Active Problem List   Diagnosis Date Noted  . Acute osteomyelitis of metatarsal bone of left foot ([place]) 07/28/2019  . Hyperlipidemia associated with type 2 diabetes mellitus ([place]) 07/28/2019  . Hypertension associated with diabetes ([place]) 07/28/2019  . Depression 07/28/2019  . Subacute osteomyelitis, left ankle and foot ([place])   . Recurrent cellulitis of lower extremity 07/21/2019  . Recurrent cellulitis 07/21/2019  . Lactic acidosis 07/21/2019  . Hyponatremia 07/08/2019  . GERD (gastroesophageal reflux disease) 07/08/2019  . Type 2 diabetes mellitus with hyperlipidemia ([place]) 07/08/2019  . BPH (benign prostatic hyperplasia) 07/08/2019  . Cellulitis 07/06/2019  . 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 HYPERCHOLESTEROLEMIA 10/05/2009  . OBSTRUCTIVE  SLEEP APNEA 10/05/2009  . MELANOMA, HX OF 10/05/2009  . H/O diabetic foot ulcer 10/05/2009   Past Medical History:  Diagnosis Date  . Diabetes ([place])   . Glaucoma   . Hemiparesis ([place])   . Hypercholesterolemia   . Hyperlipemia   . Melanoma ([place])   . Melanoma ([place])   . Obesity   . OSA (obstructive sleep apnea)   . Seizure ([place])   . Seizure disorder ([place])   . Sturge syndrome ([place])   . 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[facility])     Family History  Problem Relation Age of Onset  . Emphysema Father   . Cancer Father   . Asthma Mother     Past Surgical History:  Procedure Laterality Date  . AMPUTATION Left 07/24/2019   Procedure: AMPUTATION RAY, fifth left;  Surgeon: Newt Minion, MD;  Location: [place];  Service: Orthopedics;  Laterality: Left;  . COLONOSCOPY    . CRANIOTOMY  1999  . melanoma removal  2011   Social History   Occupational History  . Occupation: Engineer, civil (consulting)    Comment: resides in a group home([facility])  Tobacco Use  . Smoking status: Never Smoker  . Smokeless tobacco: Never Used  Substance and Sexual Activity  . Alcohol use: Not on file  . Drug use: Not on file  . Sexual activity: Not on file

## 2019-08-03 ENCOUNTER — Encounter: Payer: Self-pay | Admitting: Internal Medicine

## 2019-08-03 ENCOUNTER — Non-Acute Institutional Stay (SKILLED_NURSING_FACILITY): Payer: Medicare Other | Admitting: Internal Medicine

## 2019-08-03 DIAGNOSIS — M86172 Other acute osteomyelitis, left ankle and foot: Secondary | ICD-10-CM

## 2019-08-03 DIAGNOSIS — Z89422 Acquired absence of other left toe(s): Secondary | ICD-10-CM | POA: Diagnosis not present

## 2019-08-03 NOTE — Progress Notes (Signed)
: Provider:  Noah Delaine. Sheppard Coil MD Location:  [place] Room Number: [id number] Place of Service:  SNF ([phone])  PCP: Harden Mo, MD Patient Care Team: Harden Mo, MD as PCP - General (Family Medicine)  Extended Emergency Contact Information Primary Emergency Contact: [place] Mobile Phone: [phone] Relation: Sister Secondary Emergency Contact: [facility] Phone: [phone] Mobile Phone: [phone] Relation: Other     Allergies: Patient has no known allergies.  Chief Complaint  Patient presents with  . Hospitalization Follow-up    Hospitalization Follow Up    HPI: Patient is 63 y.o. male with diabetes mellitus type 2, hypertension, hyperlipidemia, OSA, Sturge-Weber syndrome involving face, who was admitted to [facility] from 10/27-11/3 for diabetic foot ulcer with osteomyelitis of the fifth metatarsal head status post ray amputation who is being seen in 1 week follow-up for admission to SNF.  Patient was seen on 11/6 by orthopedics and dressing was removed, patient can now wash the wound and if he dressed with a dry dressing.  Patient is still unable to bear weight on the left until f and feels like he is doing well.  [place]-up with orthopedic in 2 weeks.  Patient has no complaints and feels like he is doing well with rehab.  Past Medical History:  Diagnosis Date  . Diabetes ([place])   . Glaucoma   . Hemiparesis ([place])   . Hypercholesterolemia   . Hyperlipemia   . Melanoma ([place])   . Melanoma ([place])   . Obesity   . OSA (obstructive sleep apnea)   . Seizure ([place])   . Seizure disorder ([place])   . Sturge syndrome ([place])   . Sturge-Weber syndrome [facility])     Past Surgical History:  Procedure Laterality Date  . AMPUTATION Left 07/24/2019   Procedure: AMPUTATION RAY, fifth left;  Surgeon: Newt Minion, MD;  Location: [place];  Service: Orthopedics;  Laterality: Left;  . COLONOSCOPY    . CRANIOTOMY  1999  . melanoma  removal  2011    Allergies as of 08/03/2019   No Known Allergies     Medication List       Accurate as of August 03, 2019  3:37 PM. If you have any questions, ask your nurse or doctor.        STOP taking these medications   HYDROcodone-acetaminophen 5-325 MG tablet Commonly known as: NORCO/VICODIN Stopped by: Inocencio Homes, MD     TAKE these medications   ARTIFICIAL TEAR OP Place 1 drop into both eyes 4 (four) times daily as needed (dry eyes).   aspirin 81 MG tablet Take 81 mg by mouth daily.   brimonidine 0.2 % ophthalmic solution Commonly known as: ALPHAGAN Place 1 drop into both eyes 3 (three) times daily.   cholecalciferol 25 MCG (1000 UT) tablet Commonly known as: VITAMIN D3 Take 1,000 Units by mouth daily.   dorzolamide-timolol 22.3-6.8 MG/ML ophthalmic solution Commonly known as: COSOPT Place 1 drop into the right eye 2 (two) times daily.   escitalopram 10 MG tablet Commonly known as: LEXAPRO Take 10 mg by mouth daily.   Eucerin Skin Calming Crea Apply 1 application topically as needed (dry skin).   gabapentin 100 MG capsule Commonly known as: NEURONTIN Take 100 mg by mouth 2 (two) times daily.   hydrochlorothiazide 25 MG tablet Commonly known as: HYDRODIURIL Take 25 mg by mouth daily.   meloxicam 7.5 MG tablet Commonly known as: MOBIC Take 7.5 mg by mouth daily.  metFORMIN 1000 MG tablet Commonly known as: GLUCOPHAGE Take 1,000 mg by mouth 2 (two) times daily with a meal.   omeprazole 20 MG capsule Commonly known as: PRILOSEC Take 20 mg by mouth daily.   Rocklatan 0.02-0.005 % Soln Generic drug: Netarsudil-Latanoprost Place 1 drop into both eyes at bedtime.   rosuvastatin 20 MG tablet Commonly known as: CRESTOR Take 20 mg by mouth daily.   sitaGLIPtin 100 MG tablet Commonly known as: JANUVIA Take 100 mg by mouth daily.   tamsulosin 0.4 MG Caps capsule Commonly known as: FLOMAX Take 0.4 mg by mouth daily.       No orders of  the defined types were placed in this encounter.   Immunization History  Administered Date(s) Administered  . Influenza, Seasonal, Injecte, Preservative Fre 07/05/2015, 06/27/2016  . Influenza,inj,Quad PF,6+ Mos 06/10/2018, 06/09/2019  . Influenza-Unspecified 06/24/2013, 07/06/2014  . Pneumococcal Polysaccharide-23 07/06/2014  . Td 03/17/2010  . Tdap 09/14/2015  . Zoster 07/29/2013    Social History   Tobacco Use  . Smoking status: Never Smoker  . Smokeless tobacco: Never Used  Substance Use Topics  . Alcohol use: Not on file    Family history is   Family History  Problem Relation Age of Onset  . Emphysema Father   . Cancer Father   . Asthma Mother       Review of Systems  DATA OBTAINED: from patient, nurse GENERAL:  no fevers, fatigue, appetite changes SKIN: No itching, or rash EYES: No eye pain, redness, discharge EARS: No earache, tinnitus, change in hearing NOSE: No congestion, drainage or bleeding  MOUTH/THROAT: No mouth or tooth pain, No sore throat RESPIRATORY: No cough, wheezing, SOB CARDIAC: No chest pain, palpitations, lower extremity edema  GI: No abdominal pain, No N/V/D or constipation, No heartburn or reflux  GU: No dysuria, frequency or urgency, or incontinence  MUSCULOSKELETAL: No unrelieved bone/joint pain NEUROLOGIC: No headache, dizziness or focal weakness PSYCHIATRIC: No c/o anxiety or sadness   Vitals:   08/03/19 1529  BP: 120/72  Pulse: 78  Resp: 18  Temp: 98 F (36.7 C)  SpO2: 95%    SpO2 Readings from Last 1 Encounters:  08/03/19 95%   Body mass index is 33.47 kg/m.     Physical Exam  GENERAL APPEARANCE: Alert, conversant,  No acute distress.  SKIN: Port wine discoloration right face HEAD: Normocephalic, atraumatic  EYES: Conjunctiva/lids clear;  EOMs intact.  EARS: External exam WNL, canals clear. Hearing grossly normal.  NOSE: No deformity or discharge.  MOUTH/THROAT: Lips w/o lesions  RESPIRATORY: Breathing is  even, unlabored. Lung sounds are clear   CARDIOVASCULAR: Heart RRR no murmurs, rubs or gallops. No peripheral edema.   GASTROINTESTINAL: Abdomen is soft, non-tender, not distended w/ normal bowel sounds. GENITOURINARY: Bladder non tender, not distended  MUSCULOSKELETAL: Dressing left foot NEUROLOGIC:  Cranial nerves 2-12 grossly intact. Moves all extremities  PSYCHIATRIC: Mood and affect appropriate to situation with some mental delay, no behavioral issues  Patient Active Problem List   Diagnosis Date Noted  . 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 Acute osteomyelitis of metatarsal bone of left foot ([place]) 07/28/2019  . Hyperlipidemia associated with type 2 diabetes mellitus ([place]) 07/28/2019  . Hypertension associated with diabetes ([place]) 07/28/2019  . Depression 07/28/2019  . Subacute osteomyelitis, left ankle and foot ([place])   . Recurrent cellulitis of lower extremity 07/21/2019  . Recurrent cellulitis 07/21/2019  . Lactic acidosis 07/21/2019  . Hyponatremia  07/08/2019  . GERD (gastroesophageal reflux disease) 07/08/2019  . Type 2 diabetes mellitus with hyperlipidemia ([place]) 07/08/2019  . BPH (benign prostatic hyperplasia) 07/08/2019  . Cellulitis 07/06/2019  . 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[place])   . HYPERCHOLESTEROLEMIA 10/05/2009  . OBSTRUCTIVE SLEEP APNEA 10/05/2009  . MELANOMA, HX OF 10/05/2009  . H/O diabetic foot ulcer 10/05/2009      Labs reviewed: Basic Metabolic Panel:    Component Value Date/Time   NA 135 07/26/2019 0518   K 3.7 07/26/2019 0518   CL 99 07/26/2019 0518   CO2 27 07/26/2019 0518   GLUCOSE 122 (H) 07/26/2019 0518   BUN 18 07/26/2019 0518   CREATININE 0.46 (L) 07/26/2019 0518   CALCIUM 8.4 (L) 07/26/2019 0518   GFRNONAA >60 07/26/2019 0518   GFRAA >60 07/26/2019 0518    Recent Labs    07/24/19 0605 07/25/19 0447 07/26/19 0518  NA 135 133* 135  K 3.9  3.9 3.7  CL 100 94* 99  CO2 26 25 27   GLUCOSE 108* 132* 122*  BUN 8 7* 18  CREATININE 0.48* 0.60* 0.46*  CALCIUM 8.8* 8.7* 8.4*   Liver Function Tests: No results for input(s): AST, ALT, ALKPHOS, BILITOT, PROT, ALBUMIN in the last 8760 hours. No results for input(s): LIPASE, AMYLASE in the last 8760 hours. No results for input(s): AMMONIA in the last 8760 hours. CBC: Recent Labs    07/06/19 1215  07/08/19 0330  07/21/19 1210 07/22/19 0505 07/25/19 0447  WBC 9.6   < > 9.3   < > 8.8 6.7 10.1  NEUTROABS 7.1  --  6.3  --  6.6  --   --   HGB 12.0*   < > 13.5   < > 13.2 12.5* 13.5  HCT 38.4*   < > 43.7   < > 42.2 39.6 42.7  MCV 88.1   < > 88.3   < > 88.3 85.9 87.3  PLT 212   < > 241   < > 247 215 252   < > = values in this interval not displayed.   Lipid No results for input(s): CHOL, HDL, LDLCALC, TRIG in the last 8760 hours.  Cardiac Enzymes: Recent Labs    07/06/19 1813  CKTOTAL 33*   BNP: No results for input(s): BNP in the last 8760 hours. No results found for: [facility] Lab Results  Component Value Date   HGBA1C 6.6 (H) 07/06/2019   Lab Results  Component Value Date   TSH 1.133 07/06/2019   No results found for: VITAMINB12 No results found for: FOLATE No results found for: IRON, TIBC, FERRITIN  Imaging and Procedures obtained prior to SNF admission: Dg Foot Complete Left  Result Date: 07/21/2019 CLINICAL DATA:  Infected wound, left foot and leg swelling EXAM: LEFT FOOT - COMPLETE 3+ VIEW COMPARISON:  None. FINDINGS: The osseous structures appear diffusely demineralized which may limit detection of small or nondisplaced fractures. Circumferential swelling of the foot. Focal ulceration noted along the lateral base of the fifth metatarsophalangeal joint with subcutaneous gas and few punctate radiodensities which appear confined to the skin surface. No subjacent osseous erosion or periostitis. Large osteophyte formations are noted at the bases of the third fourth  and fifth proximal phalanges. Additional degenerative changes are noted throughout the mid and hindfoot. Large corticated os peroneum is noted IMPRESSION: 1. Focal ulceration along the lateral base of the fifth proximal phalanx joint with subcutaneous gas and few punctate radiodensities which  appear confined to the skin surface. No subjacent osseous erosion to suggest early radiographic signs of osteomyelitis however MRI would be more sensitive and specific for early features. 2. Circumferential swelling of the foot. 3. Degenerative changes as above. Electronically Signed   By: Lovena Le M.D.   On: 07/21/2019 14:39     Not all labs, radiology exams or other studies done during hospitalization come through on my EPIC note; however they are reviewed by me.    Assessment and Plan  Left foot osteomyelitis/status post ray amputation-patient seen by Ortho and wound looks good; patient can now get wound wet but no weightbearing for at least another 2 weeks till seen by orthopedics and follow-up again; continue wound care with dry dressings and continue supportive care    Hennie Duos, MD

## 2019-08-08 ENCOUNTER — Encounter: Payer: Self-pay | Admitting: Internal Medicine

## 2019-08-14 ENCOUNTER — Ambulatory Visit (INDEPENDENT_AMBULATORY_CARE_PROVIDER_SITE_OTHER): Payer: Medicare Other | Admitting: Family

## 2019-08-14 ENCOUNTER — Encounter: Payer: Self-pay | Admitting: Family

## 2019-08-14 ENCOUNTER — Other Ambulatory Visit: Payer: Self-pay

## 2019-08-14 VITALS — Ht 64.0 in | Wt 195.0 lb

## 2019-08-14 DIAGNOSIS — Z89422 Acquired absence of other left toe(s): Secondary | ICD-10-CM

## 2019-08-14 NOTE — Progress Notes (Signed)
Post-Op Visit Note   Patient: Xavier Turner           Date of Birth: 1955-12-12           MRN: [id number] Visit Date: 08/14/2019 PCP: Harden Mo, MD  Chief Complaint: No chief complaint on file.   HPI:  HPI The patient is a 63 year old gentleman seen today status post fifth ray amputation on the left.  He is residing at skilled nursing.  Ortho Exam Examination of the left fifth ray amputation this is well-healed sutures are in place.  There is 1 area that is 2 mm in diameter this is not yet healed there is no depth there is no drainage no surrounding erythema no swelling  Visit Diagnoses: No diagnosis found.  Plan: Continue daily dose of cleansing.  Dry dressing changes.  He may begin advancing his weightbearing as tolerated in regular shoewear.  He will continue his PRAFO when he is at rest.  Sutures harvested today without incident.  Did also provide an order to [facility] clinic for custom orthotics with a spacer and carbon fiber plate.  May consider extra-depth shoes.  Follow-Up Instructions: No follow-ups on file.   Imaging: No results found.  Orders:  No orders of the defined types were placed in this encounter.  No orders of the defined types were placed in this encounter.    PMFS History: Patient Active Problem List   Diagnosis Date Noted  . 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 Acute osteomyelitis of metatarsal bone of left foot ([place]) 07/28/2019  . Hyperlipidemia associated with type 2 diabetes mellitus ([place]) 07/28/2019  . Hypertension associated with diabetes ([place]) 07/28/2019  . Depression 07/28/2019  . Subacute osteomyelitis, left ankle and foot ([place])   . Recurrent cellulitis of lower extremity 07/21/2019  . Recurrent cellulitis 07/21/2019  . Lactic acidosis 07/21/2019  . Hyponatremia 07/08/2019  . GERD (gastroesophageal reflux disease) 07/08/2019  . Type 2 diabetes mellitus with hyperlipidemia ([place]) 07/08/2019   . BPH (benign prostatic hyperplasia) 07/08/2019  . Cellulitis 07/06/2019  . 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[place])   . HYPERCHOLESTEROLEMIA 10/05/2009  . OBSTRUCTIVE SLEEP APNEA 10/05/2009  . MELANOMA, HX OF 10/05/2009  . H/O diabetic foot ulcer 10/05/2009   Past Medical History:  Diagnosis Date  . Diabetes ([place])   . Glaucoma   . Hemiparesis ([place])   . Hypercholesterolemia   . Hyperlipemia   . Melanoma ([place])   . Melanoma ([place])   . Obesity   . OSA (obstructive sleep apnea)   . Seizure ([place])   . Seizure disorder ([place])   . Sturge syndrome ([place])   . 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H[facility])     Family History  Problem Relation Age of Onset  . Emphysema Father   . Cancer Father   . Asthma Mother     Past Surgical History:  Procedure Laterality Date  . AMPUTATION Left 07/24/2019   Procedure: AMPUTATION RAY, fifth left;  Surgeon: Newt Minion, MD;  Location: [place];  Service: Orthopedics;  Laterality: Left;  . COLONOSCOPY    . CRANIOTOMY  1999  . melanoma removal  2011   Social History   Occupational History  . Occupation: Engineer, civil (consulting)    Comment: resides in a group home([facility])  Tobacco Use  . Smoking status: Never Smoker  . Smokeless tobacco: Never Used  Substance and Sexual Activity  . Alcohol use: Not  on file  . Drug use: Not on file  . Sexual activity: Not on file

## 2019-08-24 ENCOUNTER — Non-Acute Institutional Stay (SKILLED_NURSING_FACILITY): Payer: Medicare Other | Admitting: Internal Medicine

## 2019-08-24 DIAGNOSIS — R600 Localized edema: Secondary | ICD-10-CM | POA: Diagnosis not present

## 2019-08-25 ENCOUNTER — Encounter: Payer: Self-pay | Admitting: Internal Medicine

## 2019-08-25 ENCOUNTER — Non-Acute Institutional Stay (SKILLED_NURSING_FACILITY): Payer: Medicare Other | Admitting: Internal Medicine

## 2019-08-25 DIAGNOSIS — R600 Localized edema: Secondary | ICD-10-CM

## 2019-08-25 DIAGNOSIS — L03116 Cellulitis of left lower limb: Secondary | ICD-10-CM | POA: Diagnosis not present

## 2019-08-25 NOTE — Progress Notes (Signed)
Location:   [place]'s clerk of Service:   SNF Provider:Arda Daggs D Sheppard Coil MD  Harden Mo, MD  Patient Care Team: Harden Mo, MD as PCP - General (Family Medicine)  Extended Emergency Contact Information Primary Emergency Contact: [place] Mobile Phone: [phone] Relation: Sister Secondary Emergency Contact: [facility] Phone: [phone] Mobile Phone: [phone] Relation: Other    Allergies: Patient has no known allergies.  Chief Complaint  Patient presents with  . Acute Visit    HPI: Patient is 63 y.o. male who nursing asked me to see because patient has developed some bilateral lower extremity edema patient has trace edema in the right lower extremity 1+ on the left.  Patient has a wound on his left foot that is reported by the wound nurse to look good.  Patient has no complaints of pain  Past Medical History:  Diagnosis Date  . Diabetes ([place])   . Glaucoma   . Hemiparesis ([place])   . Hypercholesterolemia   . Hyperlipemia   . Melanoma ([place])   . Melanoma ([place])   . Obesity   . OSA (obstructive sleep apnea)   . Seizure ([place])   . Seizure disorder ([place])   . Sturge syndrome ([place])   . Sturge-Weber syndrome [facility])     Past Surgical History:  Procedure Laterality Date  . AMPUTATION Left 07/24/2019   Procedure: AMPUTATION RAY, fifth left;  Surgeon: Newt Minion, MD;  Location: [place];  Service: Orthopedics;  Laterality: Left;  . COLONOSCOPY    . CRANIOTOMY  1999  . melanoma removal  2011    Allergies as of 08/24/2019   No Known Allergies     Medication List       Accurate as of August 24, 2019 11:59 PM. If you have any questions, ask your nurse or doctor.        ARTIFICIAL TEAR OP Place 1 drop into both eyes 4 (four) times daily as needed (dry eyes).   aspirin 81 MG tablet Take 81 mg by mouth daily.   brimonidine 0.2 % ophthalmic solution Commonly known as: ALPHAGAN Place 1 drop into both eyes 3 (three) times daily.    cholecalciferol 25 MCG (1000 UT) tablet Commonly known as: VITAMIN D3 Take 1,000 Units by mouth daily.   dorzolamide-timolol 22.3-6.8 MG/ML ophthalmic solution Commonly known as: COSOPT Place 1 drop into the right eye 2 (two) times daily.   escitalopram 10 MG tablet Commonly known as: LEXAPRO Take 10 mg by mouth daily.   Eucerin Skin Calming Crea Apply 1 application topically as needed (dry skin).   gabapentin 100 MG capsule Commonly known as: NEURONTIN Take 100 mg by mouth 2 (two) times daily.   hydrochlorothiazide 25 MG tablet Commonly known as: HYDRODIURIL Take 25 mg by mouth daily.   meloxicam 7.5 MG tablet Commonly known as: MOBIC Take 7.5 mg by mouth daily.   metFORMIN 1000 MG tablet Commonly known as: GLUCOPHAGE Take 1,000 mg by mouth 2 (two) times daily with a meal.   omeprazole 20 MG capsule Commonly known as: PRILOSEC Take 20 mg by mouth daily.   Rocklatan 0.02-0.005 % Soln Generic drug: Netarsudil-Latanoprost Place 1 drop into both eyes at bedtime.   rosuvastatin 20 MG tablet Commonly known as: CRESTOR Take 20 mg by mouth daily.   sitaGLIPtin 100 MG tablet Commonly known as: JANUVIA Take 100 mg by mouth daily.   tamsulosin 0.4 MG Caps capsule Commonly known as: FLOMAX Take 0.4 mg by mouth daily.  No orders of the defined types were placed in this encounter.   Immunization History  Administered Date(s) Administered  . Influenza, Seasonal, Injecte, Preservative Fre 07/05/2015, 06/27/2016  . Influenza,inj,Quad PF,6+ Mos 06/10/2018, 06/09/2019  . Influenza-Unspecified 06/24/2013, 07/06/2014  . Pneumococcal Polysaccharide-23 07/06/2014  . Td 03/17/2010  . Tdap 09/14/2015  . Zoster 07/29/2013    Social History   Tobacco Use  . Smoking status: Never Smoker  . Smokeless tobacco: Never Used  Substance Use Topics  . Alcohol use: Not on file    Review of Systems  GENERAL:  no fevers, fatigue, appetite changes SKIN: No itching, rash  HEENT: No complaint RESPIRATORY: No cough, wheezing, SOB CARDIAC: No chest pain, palpitations, lower extremity edema  GI: No abdominal pain, No N/V/D or constipation, No heartburn or reflux  GU: No dysuria, frequency or urgency, or incontinence  MUSCULOSKELETAL: No unrelieved bone/joint pain NEUROLOGIC: No headache, dizziness  PSYCHIATRIC: No overt anxiety or sadness  Vitals:   08/25/19 1556  BP: 123/79  Pulse: 76  Resp: 19  Temp: 97.8 F (36.6 C)   Body mass index is 33.47 kg/m. Physical Exam  GENERAL APPEARANCE: Alert, conversant, No acute distress  SKIN: Port wine stain to right face; very faint erythema to left shin, no heat HEENT: Unremarkable RESPIRATORY: Breathing is even, unlabored. Lung sounds are clear   CARDIOVASCULAR: Heart RRR no murmurs, rubs or gallops. peripheral edema right lower extremity, 1+ edema left lower extremity GASTROINTESTINAL: Abdomen is soft, non-tender, not distended w/ normal bowel sounds.  GENITOURINARY: Bladder non tender, not distended  MUSCULOSKELETAL: No abnormal joints or musculature NEUROLOGIC: Cranial nerves 2-12 grossly intact. Moves all extremities PSYCHIATRIC: Mood and affect appropriate to situation, no behavioral issues  Patient Active Problem List   Diagnosis Date Noted  . 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 Acute osteomyelitis of metatarsal bone of left foot ([place]) 07/28/2019  . Hyperlipidemia associated with type 2 diabetes mellitus ([place]) 07/28/2019  . Hypertension associated with diabetes ([place]) 07/28/2019  . Depression 07/28/2019  . Subacute osteomyelitis, left ankle and foot ([place])   . Recurrent cellulitis of lower extremity 07/21/2019  . Recurrent cellulitis 07/21/2019  . Lactic acidosis 07/21/2019  . Hyponatremia 07/08/2019  . GERD (gastroesophageal reflux disease) 07/08/2019  . Type 2 diabetes mellitus with hyperlipidemia ([place]) 07/08/2019  . BPH (benign prostatic hyperplasia)  07/08/2019  . Cellulitis 07/06/2019  . 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[place])   . HYPERCHOLESTEROLEMIA 10/05/2009  . OBSTRUCTIVE SLEEP APNEA 10/05/2009  . MELANOMA, HX OF 10/05/2009  . H/O diabetic foot ulcer 10/05/2009    CMP     Component Value Date/Time   NA 135 07/26/2019 0518   K 3.7 07/26/2019 0518   CL 99 07/26/2019 0518   CO2 27 07/26/2019 0518   GLUCOSE 122 (H) 07/26/2019 0518   BUN 18 07/26/2019 0518   CREATININE 0.46 (L) 07/26/2019 0518   CALCIUM 8.4 (L) 07/26/2019 0518   GFRNONAA >60 07/26/2019 0518   GFRAA >60 07/26/2019 0518   Recent Labs    07/24/19 0605 07/25/19 0447 07/26/19 0518  NA 135 133* 135  K 3.9 3.9 3.7  CL 100 94* 99  CO2 26 25 27   GLUCOSE 108* 132* 122*  BUN 8 7* 18  CREATININE 0.48* 0.60* 0.46*  CALCIUM 8.8* 8.7* 8.4*   No results for input(s): AST, ALT, ALKPHOS, BILITOT, PROT, ALBUMIN in the last 8760 hours. Recent Labs  07/06/19 1215  07/08/19 0330  07/21/19 1210 07/22/19 0505 07/25/19 0447  WBC 9.6   < > 9.3   < > 8.8 6.7 10.1  NEUTROABS 7.1  --  6.3  --  6.6  --   --   HGB 12.0*   < > 13.5   < > 13.2 12.5* 13.5  HCT 38.4*   < > 43.7   < > 42.2 39.6 42.7  MCV 88.1   < > 88.3   < > 88.3 85.9 87.3  PLT 212   < > 241   < > 247 215 252   < > = values in this interval not displayed.   No results for input(s): CHOL, LDLCALC, TRIG in the last 8760 hours.  Invalid input(s): HCL No results found for: MICROALBUR Lab Results  Component Value Date   TSH 1.133 07/06/2019   Lab Results  Component Value Date   HGBA1C 6.6 (H) 07/06/2019   No results found for: CHOL, HDL, LDLCALC, LDLDIRECT, TRIG, CHOLHDL  Significant Diagnostic Results in last 30 days:  No results found.  Assessment and Plan  Bilateral lower extremity edema-patient cannot use TED hose because of his wound; will use Lasix 20 mg daily for 5 days; monitor response  No problem-specific Assessment & Plan notes found  for this encounter.   Labs/tests ordered:    Hennie Duos, MD

## 2019-08-25 NOTE — Progress Notes (Signed)
Location:   [facility] of Service:   SNF provider: Provider: Webb Silversmith d Sheppard Coil md  Harden Mo, MD  Patient Care Team: Harden Mo, MD as PCP - General (Family Medicine)  Extended Emergency Contact Information Primary Emergency Contact: [place] Mobile Phone: [phone] Relation: Sister Secondary Emergency Contact: [facility] Phone: [phone] Mobile Phone: [phone] Relation: Other    Allergies: Patient has no known allergies.  Chief Complaint  Patient presents with  . Acute Visit    HPI: Patient is 63 y.o. male who is being seen for left lower extremity swelling.  In addition and the leg is very firm and tight but nontender.  The anterior leg is a little more pink than prior.  No fever chills nausea vomiting fatigue or any other systemic symptom.  Past Medical History:  Diagnosis Date  . Diabetes ([place])   . Glaucoma   . Hemiparesis ([place])   . Hypercholesterolemia   . Hyperlipemia   . Melanoma ([place])   . Melanoma ([place])   . Obesity   . OSA (obstructive sleep apnea)   . Seizure ([place])   . Seizure disorder ([place])   . 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 AMPUTATION Left 07/24/2019   Procedure: AMPUTATION RAY, fifth left;  Surgeon: Newt Minion, MD;  Location: [place];  Service: Orthopedics;  Laterality: Left;  . COLONOSCOPY    . CRANIOTOMY  1999  . melanoma removal  2011    Allergies as of 08/25/2019   No Known Allergies     Medication List       Accurate as of August 25, 2019  3:55 PM. If you have any questions, ask your nurse or doctor.        ARTIFICIAL TEAR OP Place 1 drop into both eyes 4 (four) times daily as needed (dry eyes).   aspirin 81 MG tablet Take 81 mg by mouth daily.   brimonidine 0.2 % ophthalmic solution Commonly known as: ALPHAGAN Place 1 drop into both eyes 3 (three) times daily.   cholecalciferol 25 MCG (1000 UT)  tablet Commonly known as: VITAMIN D3 Take 1,000 Units by mouth daily.   dorzolamide-timolol 22.3-6.8 MG/ML ophthalmic solution Commonly known as: COSOPT Place 1 drop into the right eye 2 (two) times daily.   escitalopram 10 MG tablet Commonly known as: LEXAPRO Take 10 mg by mouth daily.   Eucerin Skin Calming Crea Apply 1 application topically as needed (dry skin).   gabapentin 100 MG capsule Commonly known as: NEURONTIN Take 100 mg by mouth 2 (two) times daily.   hydrochlorothiazide 25 MG tablet Commonly known as: HYDRODIURIL Take 25 mg by mouth daily.   meloxicam 7.5 MG tablet Commonly known as: MOBIC Take 7.5 mg by mouth daily.   metFORMIN 1000 MG tablet Commonly known as: GLUCOPHAGE Take 1,000 mg by mouth 2 (two) times daily with a meal.   omeprazole 20 MG capsule Commonly known as: PRILOSEC Take 20 mg by mouth daily.   Rocklatan 0.02-0.005 % Soln Generic drug: Netarsudil-Latanoprost Place 1 drop into both eyes at bedtime.   rosuvastatin 20 MG tablet Commonly known as: CRESTOR Take 20 mg by mouth daily.   sitaGLIPtin 100 MG tablet Commonly known as: JANUVIA Take 100 mg by mouth daily.   tamsulosin 0.4 MG Caps capsule Commonly known as: FLOMAX Take 0.4 mg by mouth daily.       No orders  of the defined types were placed in this encounter.   Immunization History  Administered Date(s) Administered  . Influenza, Seasonal, Injecte, Preservative Fre 07/05/2015, 06/27/2016  . Influenza,inj,Quad PF,6+ Mos 06/10/2018, 06/09/2019  . Influenza-Unspecified 06/24/2013, 07/06/2014  . Pneumococcal Polysaccharide-23 07/06/2014  . Td 03/17/2010  . Tdap 09/14/2015  . Zoster 07/29/2013    Social History   Tobacco Use  . Smoking status: Never Smoker  . Smokeless tobacco: Never Used  Substance Use Topics  . Alcohol use: Not on file    Review of Systems  GENERAL:  no fevers, fatigue, appetite changes SKIN: No itching, rash HEENT: No complaint  RESPIRATORY: No cough, wheezing, SOB CARDIAC: No chest pain, palpitations, left lower extremity edema  GI: No abdominal pain, No N/V/D or constipation, No heartburn or reflux  GU: No dysuria, frequency or urgency, or incontinence  MUSCULOSKELETAL: No unrelieved bone/joint pain NEUROLOGIC: No headache, dizziness  PSYCHIATRIC: No overt anxiety or sadness  Vitals:   08/25/19 1554  BP: 100/64  Pulse: 90  Resp: 16  Temp: (!) 97 F (36.1 C)   Body mass index is 33.47 kg/m. Physical Exam  GENERAL APPEARANCE: Alert, conversant, No acute distress  SKIN: Distal anterior leg on the left is minimally pink but hot/I not think it is intersegmental nails does appreciate the fact that she take all of this she 11 that showed that she is from 85 HEENT: Unremarkable RESPIRATORY: Breathing is even, unlabored. Lung sounds are clear   CARDIOVASCULAR: Heart RRR no murmurs, rubs or gallops.  Left lower extremity edema mild, but leg is very tight and firm; there is no posterior tenderness GASTROINTESTINAL: Abdomen is soft, non-tender, not distended w/ normal bowel sounds.  GENITOURINARY: Bladder non tender, not distended  MUSCULOSKELETAL: No abnormal joints or musculature NEUROLOGIC: Cranial nerves 2-12 grossly intact. Moves all extremities PSYCHIATRIC: Mood and affect appropriate to situation, no behavioral issues  Patient Active Problem List   Diagnosis Date Noted  . 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 Acute osteomyelitis of metatarsal bone of left foot ([place]) 07/28/2019  . Hyperlipidemia associated with type 2 diabetes mellitus ([place]) 07/28/2019  . Hypertension associated with diabetes ([place]) 07/28/2019  . Depression 07/28/2019  . Subacute osteomyelitis, left ankle and foot ([place])   . Recurrent cellulitis of lower extremity 07/21/2019  . Recurrent cellulitis 07/21/2019  . Lactic acidosis 07/21/2019  . Hyponatremia 07/08/2019  . GERD (gastroesophageal  reflux disease) 07/08/2019  . Type 2 diabetes mellitus with hyperlipidemia ([place]) 07/08/2019  . BPH (benign prostatic hyperplasia) 07/08/2019  . Cellulitis 07/06/2019  . 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 HYPERCHOLESTEROLEMIA 10/05/2009  . OBSTRUCTIVE SLEEP APNEA 10/05/2009  . MELANOMA, HX OF 10/05/2009  . H/O diabetic foot ulcer 10/05/2009    CMP     Component Value Date/Time   NA 135 07/26/2019 0518   K 3.7 07/26/2019 0518   CL 99 07/26/2019 0518   CO2 27 07/26/2019 0518   GLUCOSE 122 (H) 07/26/2019 0518   BUN 18 07/26/2019 0518   CREATININE 0.46 (L) 07/26/2019 0518   CALCIUM 8.4 (L) 07/26/2019 0518   GFRNONAA >60 07/26/2019 0518   GFRAA >60 07/26/2019 0518   Recent Labs    07/24/19 0605 07/25/19 0447 07/26/19 0518  NA 135 133* 135  K 3.9 3.9 3.7  CL 100 94* 99  CO2 26 25 27   GLUCOSE 108* 132* 122*  BUN 8 7* 18  CREATININE 0.48* 0.60* 0.46*  CALCIUM 8.8* 8.7* 8.4*   No results for input(s): AST, ALT, ALKPHOS, BILITOT, PROT, ALBUMIN in the last 8760 hours. Recent Labs    07/06/19 1215  07/08/19 0330  07/21/19 1210 07/22/19 0505 07/25/19 0447  WBC 9.6   < > 9.3   < > 8.8 6.7 10.1  NEUTROABS 7.1  --  6.3  --  6.6  --   --   HGB 12.0*   < > 13.5   < > 13.2 12.5* 13.5  HCT 38.4*   < > 43.7   < > 42.2 39.6 42.7  MCV 88.1   < > 88.3   < > 88.3 85.9 87.3  PLT 212   < > 241   < > 247 215 252   < > = values in this interval not displayed.   No results for input(s): CHOL, LDLCALC, TRIG in the last 8760 hours.  Invalid input(s): HCL No results found for: MICROALBUR Lab Results  Component Value Date   TSH 1.133 07/06/2019   Lab Results  Component Value Date   HGBA1C 6.6 (H) 07/06/2019   No results found for: CHOL, HDL, LDLCALC, LDLDIRECT, TRIG, CHOLHDL  Significant Diagnostic Results in last 30 days:  No results found.  Assessment and Plan  Left lower extremity swelling/cellulitis -need to rule out  DVT; if negative for DVT then will need to consider early cellulitis.  Late entry-leg is negative for DVT; therefore will treat with doxycycline 100 mg twice daily for 7 days    Hennie Duos, MD

## 2019-08-27 ENCOUNTER — Encounter: Payer: Self-pay | Admitting: Internal Medicine

## 2019-08-27 ENCOUNTER — Telehealth: Payer: Self-pay | Admitting: Orthopedic Surgery

## 2019-08-27 NOTE — Telephone Encounter (Signed)
FYI Received call from patient's sister [name] she advised she went to visit her brother spoke with him  through glass and his foot is red,swollen and there was some bleeding. She asked that I contact [place] [facility] to make an appointment for  the patient to see Dr Sharol Given. I called Rober Minion [phone] to schedule an appointment for the patient spoke with  Erline Levine and Olu RN and she transferred me to  [facility] (Treatment nurse) Per Myriam Jacobson patient did not need an appointment and there is no infection in the patient foot. Myriam Jacobson advised she change the patient's dressing each day. Patient was put on antibiotics (Doxycycline) per the doctor at [facility]. I called [name] and advised her of the information that Myriam Jacobson gave me.  The  Number to contact Jeannene Patella is [phone] (Patient's sister)

## 2019-08-27 NOTE — Telephone Encounter (Signed)
Thank you, patient is scheduled to come in on 09/04/19 with our office and if earlier appt is needed, patient or his sister can call our office and schedule that. Thank you will inform Dr Sharol Given.

## 2019-08-27 NOTE — Progress Notes (Signed)
Location:  [place] Room Number: 103-P Place of Service:  SNF (31) Provider:  Granville Lewis, PA  Patient Care Team: Harden Mo, MD as PCP - General (Family Medicine)  Extended Emergency Contact Information Primary Emergency Contact: [place] Mobile Phone: [phone] Relation: Sister Secondary Emergency Contact: [facility] Phone: [phone] Mobile Phone: [phone] Relation: Other  Code Status:  DNR Goals of care: Advanced Directive information Advanced Directives 08/03/2019  Does Patient Have a Medical Advance Directive? Yes  Type of Advance Directive Out of facility DNR (pink MOST or yellow form)  Does patient want to make changes to medical advance directive? No - Patient declined  Would patient like information on creating a medical advance directive? -  Pre-existing out of facility DNR order (yellow form or pink MOST form) Yellow form placed in chart (order not valid for inpatient use)     Chief Complaint  Patient presents with  . Medical Management of Chronic Issues    Routine [facility] visit    HPI:  Pt is a 63 y.o. male seen today for medical management of chronic diseases.     Past Medical History:  Diagnosis Date  . Diabetes ([place])   . Glaucoma   . Hemiparesis ([place])   . Hypercholesterolemia   . Hyperlipemia   . Melanoma ([place])   . Melanoma ([place])   . Obesity   . OSA (obstructive sleep apnea)   . Seizure ([place])   . Seizure disorder ([place])   . 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 AMPUTATION Left 07/24/2019   Procedure: AMPUTATION RAY, fifth left;  Surgeon: Newt Minion, MD;  Location: [place];  Service: Orthopedics;  Laterality: Left;  . COLONOSCOPY    . CRANIOTOMY  1999  . melanoma removal  2011    No Known Allergies  Outpatient Encounter Medications as of 08/27/2019  Medication Sig  . ARTIFICIAL TEAR OP Place 1 drop  into both eyes 4 (four) times daily as needed (dry eyes).   Marland Kitchen aspirin 81 MG tablet Take 81 mg by mouth daily.    . brimonidine (ALPHAGAN) 0.2 % ophthalmic solution Place 1 drop into both eyes 3 (three) times daily.  . cholecalciferol (VITAMIN D3) 25 MCG (1000 UT) tablet Take 1,000 Units by mouth daily.  . dorzolamide-timolol (COSOPT) 22.3-6.8 MG/ML ophthalmic solution Place 1 drop into the right eye 2 (two) times daily.    . Emollient (EUCERIN SKIN CALMING) CREA Apply 1 application topically as needed (dry skin).  Marland Kitchen escitalopram (LEXAPRO) 10 MG tablet Take 10 mg by mouth daily.  Marland Kitchen gabapentin (NEURONTIN) 100 MG capsule Take 100 mg by mouth 2 (two) times daily.  . hydrochlorothiazide (HYDRODIURIL) 25 MG tablet Take 25 mg by mouth daily.   . meloxicam (MOBIC) 7.5 MG tablet Take 7.5 mg by mouth daily.  . metFORMIN (GLUCOPHAGE) 1000 MG tablet Take 1,000 mg by mouth 2 (two) times daily with a meal.  . omeprazole (PRILOSEC) 20 MG capsule Take 20 mg by mouth daily.   Marland Kitchen ROCKLATAN 0.02-0.005 % SOLN Place 1 drop into both eyes at bedtime.  . rosuvastatin (CRESTOR) 20 MG tablet Take 20 mg by mouth daily.  . sitaGLIPtin (JANUVIA) 100 MG tablet Take 100 mg by mouth daily.  . tamsulosin (FLOMAX) 0.4 MG CAPS Take 0.4 mg by mouth daily.    No facility-administered encounter medications on file as of 08/27/2019.  Review of Systems  Immunization History  Administered Date(s) Administered  . Influenza, Seasonal, Injecte, Preservative Fre 07/05/2015, 06/27/2016  . Influenza,inj,Quad PF,6+ Mos 06/10/2018, 06/09/2019  . Influenza-Unspecified 06/24/2013, 07/06/2014  . Pneumococcal Polysaccharide-23 07/06/2014  . Td 03/17/2010  . Tdap 09/14/2015  . Zoster 07/29/2013   Pertinent  Health Maintenance Due  Topic Date Due  . FOOT EXAM  04/09/1965  . OPHTHALMOLOGY EXAM  04/09/1965  . URINE MICROALBUMIN  04/09/1965  . COLONOSCOPY  07/10/2015  . HEMOGLOBIN A1C  01/04/2020  . INFLUENZA VACCINE  Completed    No flowsheet data found. Functional Status Survey:    Vitals:   08/27/19 1551  BP: 119/72  Pulse: 72  Temp: (!) 84 F (28.9 C)  TempSrc: Oral  Weight: 195 lb (88.5 kg)  Height: 5\' 4"  (1.626 m)   Body mass index is 33.47 kg/m. Physical Exam  Labs reviewed: Recent Labs    07/24/19 0605 07/25/19 0447 07/26/19 0518  NA 135 133* 135  K 3.9 3.9 3.7  CL 100 94* 99  CO2 26 25 27   GLUCOSE 108* 132* 122*  BUN 8 7* 18  CREATININE 0.48* 0.60* 0.46*  CALCIUM 8.8* 8.7* 8.4*   No results for input(s): AST, ALT, ALKPHOS, BILITOT, PROT, ALBUMIN in the last 8760 hours. Recent Labs    07/06/19 1215  07/08/19 0330  07/21/19 1210 07/22/19 0505 07/25/19 0447  WBC 9.6   < > 9.3   < > 8.8 6.7 10.1  NEUTROABS 7.1  --  6.3  --  6.6  --   --   HGB 12.0*   < > 13.5   < > 13.2 12.5* 13.5  HCT 38.4*   < > 43.7   < > 42.2 39.6 42.7  MCV 88.1   < > 88.3   < > 88.3 85.9 87.3  PLT 212   < > 241   < > 247 215 252   < > = values in this interval not displayed.   Lab Results  Component Value Date   TSH 1.133 07/06/2019   Lab Results  Component Value Date   HGBA1C 6.6 (H) 07/06/2019   No results found for: CHOL, HDL, LDLCALC, LDLDIRECT, TRIG, CHOLHDL  Significant Diagnostic Results in last 30 days:  No results found.  Assessment/Plan There are no diagnoses linked to this encounter.

## 2019-08-28 ENCOUNTER — Non-Acute Institutional Stay (SKILLED_NURSING_FACILITY): Payer: Medicare Other | Admitting: Internal Medicine

## 2019-08-28 ENCOUNTER — Encounter: Payer: Self-pay | Admitting: Internal Medicine

## 2019-08-28 DIAGNOSIS — Z89422 Acquired absence of other left toe(s): Secondary | ICD-10-CM

## 2019-08-28 DIAGNOSIS — N400 Enlarged prostate without lower urinary tract symptoms: Secondary | ICD-10-CM

## 2019-08-28 DIAGNOSIS — I10 Essential (primary) hypertension: Secondary | ICD-10-CM

## 2019-08-28 DIAGNOSIS — Q858 Other phakomatoses, not elsewhere classified: Secondary | ICD-10-CM

## 2019-08-28 DIAGNOSIS — I152 Hypertension secondary to endocrine disorders: Secondary | ICD-10-CM

## 2019-08-28 DIAGNOSIS — E1169 Type 2 diabetes mellitus with other specified complication: Secondary | ICD-10-CM

## 2019-08-28 DIAGNOSIS — E1159 Type 2 diabetes mellitus with other circulatory complications: Secondary | ICD-10-CM

## 2019-08-28 DIAGNOSIS — L03116 Cellulitis of left lower limb: Secondary | ICD-10-CM | POA: Diagnosis not present

## 2019-08-28 DIAGNOSIS — M86172 Other acute osteomyelitis, left ankle and foot: Secondary | ICD-10-CM | POA: Diagnosis not present

## 2019-08-28 DIAGNOSIS — G4733 Obstructive sleep apnea (adult) (pediatric): Secondary | ICD-10-CM

## 2019-08-28 DIAGNOSIS — Q8589 Other phakomatoses, not elsewhere classified: Secondary | ICD-10-CM

## 2019-08-28 DIAGNOSIS — E785 Hyperlipidemia, unspecified: Secondary | ICD-10-CM

## 2019-08-28 DIAGNOSIS — K219 Gastro-esophageal reflux disease without esophagitis: Secondary | ICD-10-CM

## 2019-08-28 MED ORDER — VITAMIN D 25 MCG (1000 UNIT) PO TABS: 1000.0000 [IU] | ORAL_TABLET | Freq: Every day | ORAL | 0 refills | Status: DC

## 2019-08-28 MED ORDER — ESCITALOPRAM OXALATE 10 MG PO TABS: 10.0000 mg | ORAL_TABLET | Freq: Every day | ORAL | 0 refills | Status: DC

## 2019-08-28 MED ORDER — ROCKLATAN 0.02-0.005 % OP SOLN: 1.0000 [drp] | Freq: Every day | OPHTHALMIC | 0 refills | Status: AC

## 2019-08-28 MED ORDER — DORZOLAMIDE HCL-TIMOLOL MAL 2-0.5 % OP SOLN: 1.0000 [drp] | Freq: Two times a day (BID) | OPHTHALMIC | 0 refills | Status: AC

## 2019-08-28 MED ORDER — HYDROCHLOROTHIAZIDE 25 MG PO TABS: 25.0000 mg | ORAL_TABLET | Freq: Every day | ORAL | 0 refills | Status: DC

## 2019-08-28 MED ORDER — ROSUVASTATIN CALCIUM 20 MG PO TABS: 20.0000 mg | ORAL_TABLET | Freq: Every day | ORAL | 0 refills | Status: DC

## 2019-08-28 MED ORDER — METFORMIN HCL 1000 MG PO TABS: 1000.0000 mg | ORAL_TABLET | Freq: Two times a day (BID) | ORAL | 0 refills | Status: AC

## 2019-08-28 MED ORDER — BRIMONIDINE TARTRATE 0.2 % OP SOLN: 1.0000 [drp] | Freq: Three times a day (TID) | OPHTHALMIC | 0 refills | Status: AC

## 2019-08-28 MED ORDER — SITAGLIPTIN PHOSPHATE 100 MG PO TABS: 100.0000 mg | ORAL_TABLET | Freq: Every day | ORAL | 0 refills | Status: AC

## 2019-08-28 MED ORDER — OMEPRAZOLE 20 MG PO CPDR: 20.0000 mg | DELAYED_RELEASE_CAPSULE | Freq: Every day | ORAL | 0 refills | Status: DC

## 2019-08-28 MED ORDER — MELOXICAM 7.5 MG PO TABS: 7.5000 mg | ORAL_TABLET | Freq: Every day | ORAL | 0 refills | Status: DC

## 2019-08-28 MED ORDER — GABAPENTIN 100 MG PO CAPS: 100.0000 mg | ORAL_CAPSULE | Freq: Two times a day (BID) | ORAL | 0 refills | Status: AC

## 2019-08-28 MED ORDER — TAMSULOSIN HCL 0.4 MG PO CAPS: 0.4000 mg | ORAL_CAPSULE | Freq: Every day | ORAL | 0 refills | Status: AC

## 2019-08-28 NOTE — Progress Notes (Signed)
This encounter was created in error - please disregard.

## 2019-08-28 NOTE — Progress Notes (Signed)
Location:   [facility]   Place of Service:   SNF Provider: Hennie Duos MD  PCP: Harden Mo, MD Patient Care Team: Harden Mo, MD as PCP - General (Family Medicine)  Extended Emergency Contact Information Primary Emergency Contact: [place] Mobile Phone: [phone] Relation: Sister Secondary Emergency Contact: [facility] Phone: [phone] Mobile Phone: [phone] Relation: Other  No Known Allergies  Chief Complaint  Patient presents with  . Discharge Note    HPI:  63 y.o. male male with diabetes type 2, hypertension, hyperlipidemia, OSA, Sturge-Weber syndrome involving the face, who presented to [facility] with redness of his left foot.  Patient has been seen in the ED prior for cellulitis and treated and return back to his group home.  Patient returned to the ED when 2 days after finishing antibiotics patient's redness and swelling returned and patient had clear drainage from a wound on the lateral aspect of his foot.  He did not have any fever chills nausea vomiting or diarrhea.  Patient was admitted to [facility] from 10/27-11/3 where he was diagnosed with diabetic foot ulcer of left foot with osteomyelitis of the left fifth metatarsal head.  Patient underwent an amputation ray of the left fifth.  Patient was admitted to skilled nursing facility for OT/PT and for 5 more days of p.o. antibiotic patient is now ready to be discharged to [facility] group home in [place].  Past Medical History:  Diagnosis Date  . Diabetes ([place])   . Glaucoma   . Hemiparesis ([place])   . Hypercholesterolemia   . Hyperlipemia   . Melanoma ([place])   . Melanoma ([place])   . Obesity   . OSA (obstructive sleep apnea)   . Seizure ([place])   . Seizure disorder ([place])   . 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 AMPUTATION Left 07/24/2019   Procedure: AMPUTATION RAY, fifth left;  Surgeon:  Newt Minion, MD;  Location: [place];  Service: Orthopedics;  Laterality: Left;  . COLONOSCOPY    . CRANIOTOMY  1999  . melanoma removal  2011     reports that he has never smoked. He has never used smokeless tobacco. No history on file for alcohol and drug. Social History   Socioeconomic History  . Marital status: Single    Spouse name: Not on file  . Number of children: Not on file  . Years of education: Not on file  . Highest education level: Not on file  Occupational History  . Occupation: Engineer, civil (consulting)    Comment: resides in a group home([facility])  Social Needs  . Financial resource strain: Not on file  . Food insecurity    Worry: Not on file    Inability: Not on file  . Transportation needs    Medical: Not on file    Non-medical: Not on file  Tobacco Use  . Smoking status: Never Smoker  . Smokeless tobacco: Never Used  Substance and Sexual Activity  . Alcohol use: Not on file  . Drug use: Not on file  . Sexual activity: Not on file  Lifestyle  . Physical activity    Days per week: Not on file    Minutes per session: Not on file  . Stress: Not on file  Relationships  . Social Herbalist on phone: Not on file    Gets together: Not on file  Attends religious service: Not on file    Active member of club or organization: Not on file    Attends meetings of clubs or organizations: Not on file    Relationship status: Not on file  . Intimate partner violence    Fear of current or ex partner: Not on file    Emotionally abused: Not on file    Physically abused: Not on file    Forced sexual activity: Not on file  Other Topics Concern  . Not on file  Social History Narrative   Single.  Employed by [employer].  Never smoker. Has legal guardian.     Pertinent  Health Maintenance Due  Topic Date Due  . URINE MICROALBUMIN  04/09/1965  . OPHTHALMOLOGY EXAM  06/04/2019  . HEMOGLOBIN A1C  01/04/2020  . FOOT EXAM  06/08/2020  . COLONOSCOPY   04/29/2022  . INFLUENZA VACCINE  Completed    Medications: Allergies as of 08/28/2019   No Known Allergies     Medication List       Accurate as of August 28, 2019 11:59 PM. If you have any questions, ask your nurse or doctor.        STOP taking these medications   doxycycline 100 MG tablet Commonly known as: VIBRA-TABS Stopped by: Inocencio Homes, MD   furosemide 20 MG tablet Commonly known as: LASIX Stopped by: Inocencio Homes, MD     TAKE these medications   ARTIFICIAL TEAR OP Place 1 drop into both eyes 4 (four) times daily as needed (dry eyes).   aspirin 81 MG tablet Take 81 mg by mouth daily.   brimonidine 0.2 % ophthalmic solution Commonly known as: ALPHAGAN Place 1 drop into both eyes 3 (three) times daily.   cholecalciferol 25 MCG (1000 UT) tablet Commonly known as: VITAMIN D3 Take 1 tablet (1,000 Units total) by mouth daily.   dorzolamide-timolol 22.3-6.8 MG/ML ophthalmic solution Commonly known as: COSOPT Place 1 drop into the right eye 2 (two) times daily.   escitalopram 10 MG tablet Commonly known as: LEXAPRO Take 1 tablet (10 mg total) by mouth daily.   Eucerin Skin Calming Crea Apply 1 application topically as needed (dry skin).   gabapentin 100 MG capsule Commonly known as: NEURONTIN Take 1 capsule (100 mg total) by mouth 2 (two) times daily.   hydrochlorothiazide 25 MG tablet Commonly known as: HYDRODIURIL Take 1 tablet (25 mg total) by mouth daily.   meloxicam 7.5 MG tablet Commonly known as: MOBIC Take 1 tablet (7.5 mg total) by mouth daily.   metFORMIN 1000 MG tablet Commonly known as: GLUCOPHAGE Take 1 tablet (1,000 mg total) by mouth 2 (two) times daily with a meal.   omeprazole 20 MG capsule Commonly known as: PRILOSEC Take 1 capsule (20 mg total) by mouth daily.   Rocklatan 0.02-0.005 % Soln Generic drug: Netarsudil-Latanoprost Place 1 drop into both eyes at bedtime.   rosuvastatin 20 MG tablet Commonly known as:  CRESTOR Take 1 tablet (20 mg total) by mouth daily.   sitaGLIPtin 100 MG tablet Commonly known as: JANUVIA Take 1 tablet (100 mg total) by mouth daily.   tamsulosin 0.4 MG Caps capsule Commonly known as: FLOMAX Take 1 capsule (0.4 mg total) by mouth daily.        Vitals:   08/28/19 1556  BP: 112/72  Pulse: 78  Resp: 18  Temp: (!) 97.5 F (36.4 C)  Height: 5\' 4"  (1.626 m)   Body mass index is 33.47 kg/m.  Physical  Exam  GENERAL APPEARANCE: Alert, conversant. No acute distress.  HEENT: Unremarkable. RESPIRATORY: Breathing is even, unlabored. Lung sounds are clear   CARDIOVASCULAR: Heart RRR no murmurs, rubs or gallops. No peripheral edema.  GASTROINTESTINAL: Abdomen is soft, non-tender, not distended w/ normal bowel sounds.  NEUROLOGIC: Cranial nerves 2-12 grossly intact. Moves all extremities   Labs reviewed: Basic Metabolic Panel: Recent Labs    07/24/19 0605 07/25/19 0447 07/26/19 0518  NA 135 133* 135  K 3.9 3.9 3.7  CL 100 94* 99  CO2 26 25 27   GLUCOSE 108* 132* 122*  BUN 8 7* 18  CREATININE 0.48* 0.60* 0.46*  CALCIUM 8.8* 8.7* 8.4*   No results found for: [facility] Liver Function Tests: No results for input(s): AST, ALT, ALKPHOS, BILITOT, PROT, ALBUMIN in the last 8760 hours. No results for input(s): LIPASE, AMYLASE in the last 8760 hours. No results for input(s): AMMONIA in the last 8760 hours. CBC: Recent Labs    07/06/19 1215  07/08/19 0330  07/21/19 1210 07/22/19 0505 07/25/19 0447  WBC 9.6   < > 9.3   < > 8.8 6.7 10.1  NEUTROABS 7.1  --  6.3  --  6.6  --   --   HGB 12.0*   < > 13.5   < > 13.2 12.5* 13.5  HCT 38.4*   < > 43.7   < > 42.2 39.6 42.7  MCV 88.1   < > 88.3   < > 88.3 85.9 87.3  PLT 212   < > 241   < > 247 215 252   < > = values in this interval not displayed.   Lipid No results for input(s): CHOL, HDL, LDLCALC, TRIG in the last 8760 hours. Cardiac Enzymes: Recent Labs    07/06/19 1813  CKTOTAL 33*   BNP: No results  for input(s): BNP in the last 8760 hours. CBG: Recent Labs    07/27/19 1612 07/27/19 2109 07/28/19 0509  GLUCAP 178* 222* 124*    Procedures and Imaging Studies During Stay: No results found.  Assessment/Plan:   No diagnosis found.   Patient is being discharged with the following home health services:  OT/PT  Patient is being discharged with the following durable medical equipment:  Standards [facility] [id number]  Patient has been advised to f/u with their PCP in 1-2 weeks to bring them up to date on their rehab stay.  Social services at facility was responsible for arranging this appointment.  Pt was provided with a 30 day supply of prescriptions for medications and refills must be obtained from their PCP.  For controlled substances, a more limited supply may be provided adequate until PCP appointment only.    Time spent > 35 min'> 50% of time with patient was spent reviewing records, labs, tests and studies, counseling and developing plan of care  Hennie Duos MD

## 2019-08-30 ENCOUNTER — Encounter: Payer: Self-pay | Admitting: Internal Medicine

## 2019-09-04 ENCOUNTER — Encounter: Payer: Self-pay | Admitting: Internal Medicine

## 2019-09-04 ENCOUNTER — Ambulatory Visit (INDEPENDENT_AMBULATORY_CARE_PROVIDER_SITE_OTHER): Payer: Medicare Other | Admitting: Family

## 2019-09-04 ENCOUNTER — Other Ambulatory Visit: Payer: Self-pay

## 2019-09-04 ENCOUNTER — Encounter: Payer: Self-pay | Admitting: Family

## 2019-09-04 VITALS — Ht 64.0 in | Wt 195.0 lb

## 2019-09-04 DIAGNOSIS — Z89422 Acquired absence of other left toe(s): Secondary | ICD-10-CM

## 2019-09-04 NOTE — Progress Notes (Signed)
Post-Op Visit Note   Patient: Xavier Turner           Date of Birth: 1955-09-26           MRN: [id number] Visit Date: 09/04/2019 PCP: Harden Mo, MD  Chief Complaint:  Chief Complaint  Patient presents with  . Left Foot - Routine Post Op    07/24/2019 Left foot 5th ray    HPI:  HPI The patient is a 63 year old gentleman seen today status post fifth ray amputation on October 30.  He is in a wheelchair however he does state he is done some weightbearing he is accompanied by a care assistant.  There is no dressing in place over his incision Ortho Exam He has been discharged from skilled nursing  Has equal bilateral lower extremity edema with pitting On examination the incision is nearly healed there are 2 areas 2 mm in diameter that have granulation tissue there is no drainage no surrounding erythema no odor no sign of infection  Visit Diagnoses:  1. 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  He may follow-up in the office as needed.  Did provide an order for custom orthotic and extra-depth shoes Follow-Up Instructions: Return if symptoms worsen or fail to improve.   Imaging: No results found.  Orders:  No orders of the defined types were placed in this encounter.  No orders of the defined types were placed in this encounter.    PMFS History: Patient Active Problem List   Diagnosis Date Noted  . 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 Acute osteomyelitis of metatarsal bone of left foot ([place]) 07/28/2019  . Hyperlipidemia associated with type 2 diabetes mellitus ([place]) 07/28/2019  . Hypertension associated with diabetes ([place]) 07/28/2019  . Depression 07/28/2019  . Subacute osteomyelitis, left ankle and foot ([place])   . Recurrent cellulitis of lower extremity 07/21/2019  . Recurrent cellulitis 07/21/2019  . Lactic acidosis 07/21/2019    . Hyponatremia 07/08/2019  . GERD (gastroesophageal reflux disease) 07/08/2019  . Type 2 diabetes mellitus with hyperlipidemia ([place]) 07/08/2019  . BPH (benign prostatic hyperplasia) 07/08/2019  . Cellulitis 07/06/2019  . 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 HYPERCHOLESTEROLEMIA 10/05/2009  . OBSTRUCTIVE SLEEP APNEA 10/05/2009  . MELANOMA, HX OF 10/05/2009  . H/O diabetic foot ulcer 10/05/2009   Past Medical History:  Diagnosis Date  . Diabetes ([place])   . Glaucoma   . Hemiparesis ([place])   . Hypercholesterolemia   . Hyperlipemia   . Melanoma ([place])   . Melanoma ([place])   . Obesity   . OSA (obstructive sleep apnea)   . Seizure ([place])   . Seizure disorder ([place])   . Sturge syndrome ([place])   . 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[facility])     Family History  Problem Relation Age of Onset  . Emphysema Father   . Cancer Father   . Asthma Mother     Past Surgical History:  Procedure Laterality Date  . AMPUTATION Left 07/24/2019   Procedure: AMPUTATION RAY, fifth left;  Surgeon: Newt Minion, MD;  Location: [facility];  Service: Orthopedics;  Laterality: Left;  . COLONOSCOPY    . CRANIOTOMY  1999  . melanoma removal  2011   Social History   Occupational History  . Occupation: Engineer, civil (consulting)  Comment: resides in a group home([facility])  Tobacco Use  . Smoking status: Never Smoker  . Smokeless tobacco: Never Used  Substance and Sexual Activity  . Alcohol use: Not on file  . Drug use: Not on file  . Sexual activity: Not on file

## 2019-09-07 ENCOUNTER — Emergency Department (HOSPITAL_COMMUNITY)
Admission: EM | Admit: 2019-09-07 | Discharge: 2019-09-08 | Disposition: A | Discharge status: Home / Self Care | Payer: Medicare Other | Attending: Emergency Medicine | Admitting: Emergency Medicine

## 2019-09-07 ENCOUNTER — Emergency Department (HOSPITAL_COMMUNITY): Payer: Medicare Other

## 2019-09-07 DIAGNOSIS — L03116 Cellulitis of left lower limb: Secondary | ICD-10-CM | POA: Insufficient documentation

## 2019-09-07 DIAGNOSIS — Z7982 Long term (current) use of aspirin: Secondary | ICD-10-CM | POA: Insufficient documentation

## 2019-09-07 DIAGNOSIS — E119 Type 2 diabetes mellitus without complications: Secondary | ICD-10-CM | POA: Insufficient documentation

## 2019-09-07 DIAGNOSIS — Z79899 Other long term (current) drug therapy: Secondary | ICD-10-CM | POA: Diagnosis not present

## 2019-09-07 DIAGNOSIS — Z7984 Long term (current) use of oral hypoglycemic drugs: Secondary | ICD-10-CM | POA: Diagnosis not present

## 2019-09-07 DIAGNOSIS — Z5189 Encounter for other specified aftercare: Secondary | ICD-10-CM

## 2019-09-07 DIAGNOSIS — Z4800 Encounter for change or removal of nonsurgical wound dressing: Secondary | ICD-10-CM | POA: Diagnosis present

## 2019-09-07 LAB — URINALYSIS, ROUTINE W REFLEX MICROSCOPIC
Bilirubin Urine: NEGATIVE
Glucose, UA: NEGATIVE mg/dL
Hgb urine dipstick: NEGATIVE
Ketones, ur: NEGATIVE mg/dL
Leukocytes,Ua: NEGATIVE
Nitrite: NEGATIVE
Protein, ur: NEGATIVE mg/dL
Specific Gravity, Urine: 1.025 (ref 1.005–1.030)
pH: 6 (ref 5.0–8.0)

## 2019-09-07 LAB — COMPREHENSIVE METABOLIC PANEL
ALT: 33 U/L (ref 0–44)
AST: 23 U/L (ref 15–41)
Albumin: 3.2 g/dL — ABNORMAL LOW (ref 3.5–5.0)
Alkaline Phosphatase: 82 U/L (ref 38–126)
Anion gap: 8 (ref 5–15)
BUN: 17 mg/dL (ref 8–23)
CO2: 29 mmol/L (ref 22–32)
Calcium: 8.8 mg/dL — ABNORMAL LOW (ref 8.9–10.3)
Chloride: 97 mmol/L — ABNORMAL LOW (ref 98–111)
Creatinine, Ser: 0.46 mg/dL — ABNORMAL LOW (ref 0.61–1.24)
GFR calc Af Amer: >60 mL/min (ref >60)
GFR calc non Af Amer: >60 mL/min (ref >60)
Glucose, Bld: 143 mg/dL — ABNORMAL HIGH (ref 70–99)
Potassium: 3.8 mmol/L (ref 3.5–5.1)
Sodium: 134 mmol/L — ABNORMAL LOW (ref 135–145)
Total Bilirubin: 1.3 mg/dL — ABNORMAL HIGH (ref 0.3–1.2)
Total Protein: 6.2 g/dL — ABNORMAL LOW (ref 6.5–8.1)

## 2019-09-07 LAB — CBC WITH DIFFERENTIAL/PLATELET
Abs Immature Granulocytes: 0.03 10*3/uL (ref 0.00–0.07)
Basophils Absolute: 0.1 10*3/uL (ref 0.0–0.1)
Basophils Relative: 1 %
Eosinophils Absolute: 0.2 10*3/uL (ref 0.0–0.5)
Eosinophils Relative: 3 %
HCT: 39.2 % (ref 39.0–52.0)
Hemoglobin: 12.6 g/dL — ABNORMAL LOW (ref 13.0–17.0)
Immature Granulocytes: 0 %
Lymphocytes Relative: 21 %
Lymphs Abs: 1.5 10*3/uL (ref 0.7–4.0)
MCH: 27.6 pg (ref 26.0–34.0)
MCHC: 32.1 g/dL (ref 30.0–36.0)
MCV: 85.8 fL (ref 80.0–100.0)
Monocytes Absolute: 0.5 10*3/uL (ref 0.1–1.0)
Monocytes Relative: 8 %
Neutro Abs: 4.6 10*3/uL (ref 1.7–7.7)
Neutrophils Relative %: 67 %
Platelets: 199 10*3/uL (ref 150–400)
RBC: 4.57 MIL/uL (ref 4.22–5.81)
RDW: 14.4 % (ref 11.5–15.5)
WBC: 6.8 10*3/uL (ref 4.0–10.5)
nRBC: 0 % (ref 0.0–0.2)

## 2019-09-07 LAB — LACTIC ACID, PLASMA
Lactic Acid, Venous: 1.4 mmol/L (ref 0.5–1.9)
Lactic Acid, Venous: 1.5 mmol/L (ref 0.5–1.9)

## 2019-09-07 MED ORDER — DOXYCYCLINE HYCLATE 100 MG PO CAPS: 100.0000 mg | ORAL_CAPSULE | Freq: Two times a day (BID) | ORAL | 0 refills | Status: DC

## 2019-09-07 MED ORDER — SODIUM CHLORIDE 0.9% FLUSH: 3.0000 mL | Freq: Once | INTRAVENOUS | Status: DC

## 2019-09-07 MED ORDER — CEPHALEXIN 250 MG PO CAPS
500.0000 mg | ORAL_CAPSULE | Freq: Once | ORAL | Status: AC
  Administered 2019-09-07: 500 mg via ORAL
  Filled 2019-09-07: qty 2

## 2019-09-07 MED ORDER — DOXYCYCLINE HYCLATE 100 MG PO TABS
100.0000 mg | ORAL_TABLET | Freq: Once | ORAL | Status: AC
  Administered 2019-09-07: 100 mg via ORAL
  Filled 2019-09-07: qty 1

## 2019-09-07 MED ORDER — CEPHALEXIN 500 MG PO CAPS: 500.0000 mg | ORAL_CAPSULE | Freq: Four times a day (QID) | ORAL | 0 refills | Status: AC

## 2019-09-07 NOTE — Discharge Instructions (Addendum)
You may have diarrhea from the antibiotics.  It is very important that you continue to take the antibiotics even if you get diarrhea unless a medical professional tells you that you may stop taking them.  If you stop too early the bacteria you are being treated for will become stronger and you may need different, more powerful antibiotics that have more side effects and worsening diarrhea.  Please stay well hydrated and consider probiotics as they may decrease the severity of your diarrhea.   °

## 2019-09-07 NOTE — ED Provider Notes (Signed)
[facility] EMERGENCY DEPARTMENT Provider Note   CSN: [id number] Arrival date & time: 09/07/19  1627     History Chief Complaint  Patient presents with  . Wound Check    Xavier Turner is a 63 y.o. male with a past medical history of Sturge-Weber syndrome, seizure disorder, diabetes, status post partial amputation of the fifth toe of the left foot on 07/31/2019 with Dr. Sharol Given, who presents today for concern of infection.  History obtained from patient, his sister who is his legal guardian, and chart review.  Patient reports that over the past day his leg has become increasingly red and swollen.  He does not have any fevers.  He was seen on 09/04/2019 for concern of increased swelling and at that point his legs were symmetrical.     HPI     Past Medical History:  Diagnosis Date  . Diabetes ([place])   . Glaucoma   . Hemiparesis ([place])   . Hypercholesterolemia   . Hyperlipemia   . Melanoma ([place])   . Melanoma ([place])   . Obesity   . OSA (obstructive sleep apnea)   . Seizure ([place])   . Seizure disorder ([place])   . Sturge syndrome ([place])   . Sturge-Weber syndrome [facility])     Patient Active Problem List   Diagnosis Date Noted  . 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 Acute osteomyelitis of metatarsal bone of left foot ([place]) 07/28/2019  . Hyperlipidemia associated with type 2 diabetes mellitus ([place]) 07/28/2019  . Hypertension associated with diabetes ([place]) 07/28/2019  . Depression 07/28/2019  . Subacute osteomyelitis, left ankle and foot ([place])   . Recurrent cellulitis of lower extremity 07/21/2019  . Recurrent cellulitis 07/21/2019  . Lactic acidosis 07/21/2019  . Hyponatremia 07/08/2019  . GERD (gastroesophageal reflux disease) 07/08/2019  . Type 2 diabetes mellitus with hyperlipidemia ([place]) 07/08/2019  . BPH (benign prostatic hyperplasia) 07/08/2019  . Cellulitis 07/06/2019  . 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 HYPERCHOLESTEROLEMIA 10/05/2009  . OBSTRUCTIVE SLEEP APNEA 10/05/2009  . MELANOMA, HX OF 10/05/2009  . H/O diabetic foot ulcer 10/05/2009    Past Surgical History:  Procedure Laterality Date  . AMPUTATION Left 07/24/2019   Procedure: AMPUTATION RAY, fifth left;  Surgeon: Newt Minion, MD;  Location: [facility];  Service: Orthopedics;  Laterality: Left;  . COLONOSCOPY    . CRANIOTOMY  1999  . melanoma removal  2011       Family History  Problem Relation Age of Onset  . Emphysema Father   . Cancer Father   . Asthma Mother     Social History   Tobacco Use  . Smoking status: Never Smoker  . Smokeless tobacco: Never Used  Substance Use Topics  . Alcohol use: Not on file  . Drug use: Not on file    Home Medications Prior to Admission medications   Medication Sig Start Date End Date Taking? Authorizing Provider  ARTIFICIAL TEAR OP Place 1 drop into both eyes 4 (four) times daily as needed (dry eyes).     [provider]  aspirin 81 MG tablet Take 81 mg by mouth daily.      [provider]  brimonidine (ALPHAGAN) 0.2 % ophthalmic solution Place 1 drop into both eyes 3 (three) times daily. 08/28/19   Hennie Duos, MD  cephALEXin (KEFLEX) 500 MG capsule Take 1 capsule (500  mg total) by mouth 4 (four) times daily for 10 days. 09/07/19 09/17/19  Lorin Glass, PA-C  cholecalciferol (VITAMIN D3) 25 MCG (1000 UT) tablet Take 1 tablet (1,000 Units total) by mouth daily. 08/28/19   Hennie Duos, MD  dorzolamide-timolol (COSOPT) 22.3-6.8 MG/ML ophthalmic solution Place 1 drop into the right eye 2 (two) times daily. 08/28/19   Hennie Duos, MD  doxycycline (VIBRAMYCIN) 100 MG capsule Take 1 capsule (100 mg total) by mouth 2 (two) times daily. 09/07/19   Lorin Glass, PA-C  Emollient (EUCERIN SKIN CALMING) CREA Apply 1 application topically as needed (dry skin).    [provider]    escitalopram (LEXAPRO) 10 MG tablet Take 1 tablet (10 mg total) by mouth daily. 08/28/19   Hennie Duos, MD  gabapentin (NEURONTIN) 100 MG capsule Take 1 capsule (100 mg total) by mouth 2 (two) times daily. 08/28/19   Hennie Duos, MD  hydrochlorothiazide (HYDRODIURIL) 25 MG tablet Take 1 tablet (25 mg total) by mouth daily. 08/28/19   Hennie Duos, MD  meloxicam (MOBIC) 7.5 MG tablet Take 1 tablet (7.5 mg total) by mouth daily. 08/28/19   Hennie Duos, MD  metFORMIN (GLUCOPHAGE) 1000 MG tablet Take 1 tablet (1,000 mg total) by mouth 2 (two) times daily with a meal. 08/28/19   Hennie Duos, MD  omeprazole (PRILOSEC) 20 MG capsule Take 1 capsule (20 mg total) by mouth daily. 08/28/19   Hennie Duos, MD  ROCKLATAN 0.02-0.005 % SOLN Place 1 drop into both eyes at bedtime. 08/28/19   Hennie Duos, MD  rosuvastatin (CRESTOR) 20 MG tablet Take 1 tablet (20 mg total) by mouth daily. 08/28/19   Hennie Duos, MD  sitaGLIPtin (JANUVIA) 100 MG tablet Take 1 tablet (100 mg total) by mouth daily. 08/28/19   Hennie Duos, MD  tamsulosin (FLOMAX) 0.4 MG CAPS capsule Take 1 capsule (0.4 mg total) by mouth daily. 08/28/19   Hennie Duos, MD    Allergies    Patient has no known allergies.  Review of Systems   Review of Systems  Constitutional: Negative for chills and fever.  Respiratory: Negative for shortness of breath.   Cardiovascular: Negative for chest pain and palpitations.  Gastrointestinal: Negative for abdominal pain.  Skin: Positive for color change.  Neurological: Negative for weakness and headaches.  All other systems reviewed and are negative.   Physical Exam Updated Vital Signs BP (!) 123/93 (BP Location: Right Arm)   Pulse 70   Temp 98.4 F (36.9 C) (Oral)   Resp 18   SpO2 97%   Physical Exam Vitals and nursing note reviewed.  Constitutional:      General: He is not in acute distress.    Appearance: He is well-developed. He is not  diaphoretic.  HENT:     Head: Atraumatic.     Comments: Edema and skin discoloration of the right side of the face.  Eyes:     General: No scleral icterus.       Right eye: No discharge.        Left eye: No discharge.     Conjunctiva/sclera: Conjunctivae normal.  Cardiovascular:     Rate and Rhythm: Normal rate and regular rhythm.     Pulses: Normal pulses.  Pulmonary:     Effort: Pulmonary effort is normal. No respiratory distress.     Breath sounds: No stridor.  Abdominal:     General: There is no distension.  Musculoskeletal:        General: No deformity.     Cervical back: Normal range of motion.     Comments: Left leg edema.  Localized around ankle and foot.  Edema does not extend to calf.    Skin:    General: Skin is warm and dry.     Comments: Please see clinical image.  There is redness and swelling of the left foot and pitting edema.  There is a small amount of crusting and yellow discharge around the wounds on the lateral side of the left foot.  Redness extends mid way up the thigh.   There is no redness over the left thigh or calf.    Neurological:     Mental Status: He is alert. Mental status is at baseline.     Motor: No abnormal muscle tone.  Psychiatric:        Behavior: Behavior normal.           ED Results / Procedures / Treatments   Labs (all labs ordered are listed, but only abnormal results are displayed) Labs Reviewed  COMPREHENSIVE METABOLIC PANEL - Abnormal; Notable for the following components:      Result Value   Sodium 134 (*)    Chloride 97 (*)    Glucose, Bld 143 (*)    Creatinine, Ser 0.46 (*)    Calcium 8.8 (*)    Total Protein 6.2 (*)    Albumin 3.2 (*)    Total Bilirubin 1.3 (*)    All other components within normal limits  CBC WITH DIFFERENTIAL/PLATELET - Abnormal; Notable for the following components:   Hemoglobin 12.6 (*)    All other components within normal limits  URINALYSIS, ROUTINE W REFLEX MICROSCOPIC - Abnormal;  Notable for the following components:   APPearance HAZY (*)    All other components within normal limits  LACTIC ACID, PLASMA  LACTIC ACID, PLASMA    EKG None  Radiology DG Tibia/Fibula Left  Result Date: 09/07/2019 CLINICAL DATA:  Swelling and redness. EXAM: LEFT TIBIA AND FIBULA - 2 VIEW COMPARISON:  None. FINDINGS: There is diffuse nonspecific soft tissue swelling about the lower extremity. There is no displaced fracture. No dislocation. No radiopaque foreign body. No radiographic evidence for osteomyelitis. IMPRESSION: Diffuse nonspecific soft tissue swelling. No displaced fracture or dislocation. No radiographic evidence for osteomyelitis. No radiopaque foreign body. Electronically Signed   By: Constance Holster M.D.   On: 09/07/2019 22:51   DG Foot Complete Left  Result Date: 09/07/2019 CLINICAL DATA:  Pain and swelling status post recent amputation EXAM: LEFT FOOT - COMPLETE 3+ VIEW COMPARISON:  07/21/2019 FINDINGS: Patient is status post transmetatarsal amputation through the fifth digit. There is nonspecific soft tissue swelling about the foot. Advanced degenerative changes are noted at the fourth metatarsophalangeal joint. There is no acute displaced fracture. No dislocation. IMPRESSION: 1. Status post transmetatarsal amputation through the fifth digit. 2. Advanced degenerative changes at the fourth metatarsophalangeal joint. 3. Nonspecific soft tissue swelling about the foot. Electronically Signed   By: Constance Holster M.D.   On: 09/07/2019 22:53    Procedures Procedures (including critical care time)  Medications Ordered in ED Medications  sodium chloride flush (NS) 0.9 % injection 3 mL (has no administration in time range)  cephALEXin (KEFLEX) capsule 500 mg (500 mg Oral Given 09/07/19 2348)  doxycycline (VIBRA-TABS) tablet 100 mg (100 mg Oral Given 09/07/19 2348)    ED Course  I have reviewed the triage vital signs and  the nursing notes.  Pertinent labs & imaging  results that were available during my care of the patient were reviewed by me and considered in my medical decision making (see chart for details).    MDM Rules/Calculators/A&P                     RYOMA PACKETT presents today for evaluation of left foot redness and swelling.  He is status post left partial fifth ray amputation.  On exam he has redness and swelling of the left leg with small amount of drainage crusted from residual wounds.  Labs were obtained and reviewed without any evidence of infection.  He is afebrile, not tachycardic or tachypneic does not meet Sirs/sepsis criteria.  He denies any chest pain or shortness of breath.  He will be treated with Keflex and doxycycline for suspected cellulitis.  In addition orders were placed for outpatient DVT study.  Based on the lower suspicion of DVT he is not started on anticoagulation.  I spoke with his sister, who is his legal guardian about plan and answered all her questions.  This patient was seen as a shared visit with Dr. Roslynn Amble.  Return precautions were discussed with patient who states their understanding.  At the time of discharge patient denied any unaddressed complaints or concerns.  Patient is agreeable for discharge home.  Final Clinical Impression(s) / ED Diagnoses Final diagnoses:  Visit for wound check  Cellulitis of left foot    Rx / DC Orders ED Discharge Orders         Ordered    cephALEXin (KEFLEX) 500 MG capsule  4 times daily     09/07/19 2323    doxycycline (VIBRAMYCIN) 100 MG capsule  2 times daily     09/07/19 2323    LE VENOUS     09/07/19 2346           Lorin Glass, [place] 09/08/19 0018    Lucrezia Starch, MD 09/09/19 [phone]

## 2019-09-07 NOTE — ED Triage Notes (Signed)
Pt arrives to ED with c/c of wound check after having left pinky toe removed 1 week ago. Pt lives at a group home and today after nurse checked his toe she sent him to ER due to redness and swelling.

## 2019-09-08 ENCOUNTER — Ambulatory Visit (HOSPITAL_COMMUNITY): Payer: Medicare Other

## 2019-09-08 NOTE — ED Notes (Signed)
Called and spoke with pt's group home resident manager. Manager said he would arrange a ride home for patient. States it would be about an hour until they get here. Pt offered drink and placed in hallway bed. Discharge instructions reviewed

## 2019-09-09 ENCOUNTER — Telehealth: Payer: Self-pay | Admitting: Orthopedic Surgery

## 2019-09-09 ENCOUNTER — Encounter (HOSPITAL_COMMUNITY): Payer: Medicare Other

## 2019-09-09 NOTE — Telephone Encounter (Signed)
Called patient spoke with Group Home representative left message on voicesmail asking for a return call to schedule an appointment with Dr Sharol Given or Velta Addison. Patient was seen in the ED

## 2019-09-10 ENCOUNTER — Other Ambulatory Visit: Payer: Self-pay

## 2019-09-10 ENCOUNTER — Ambulatory Visit (HOSPITAL_COMMUNITY)
Admission: RE | Admit: 2019-09-10 | Discharge: 2019-09-10 | Disposition: A | Discharge status: Home / Self Care | Payer: Medicare Other | Source: Ambulatory Visit | Attending: Surgery | Admitting: Surgery

## 2019-09-10 ENCOUNTER — Ambulatory Visit (HOSPITAL_COMMUNITY): Payer: Medicare Other

## 2019-09-10 DIAGNOSIS — R609 Edema, unspecified: Secondary | ICD-10-CM

## 2019-09-10 NOTE — Progress Notes (Signed)
Left lower extremity venous duplex has been completed. Preliminary results can be found in CV Proc through chart review.   09/10/19 4:38 PM Carlos Levering RVT

## 2019-09-11 DIAGNOSIS — S98139A Complete traumatic amputation of one unspecified lesser toe, initial encounter: Secondary | ICD-10-CM | POA: Insufficient documentation

## 2019-09-11 DIAGNOSIS — Z7409 Other reduced mobility: Secondary | ICD-10-CM | POA: Insufficient documentation

## 2019-09-29 ENCOUNTER — Telehealth: Payer: Self-pay | Admitting: Family

## 2019-09-29 NOTE — Telephone Encounter (Signed)
Do e have any paperwork on this pt?

## 2019-09-29 NOTE — Telephone Encounter (Signed)
Sharyn Lull from [facility]. She wanted to follow up on paperwork regarding the patient that was faxed over on December 23rd at 1:22  Call back number: [phone]

## 2019-09-30 NOTE — Telephone Encounter (Signed)
No, haven't seen anything from [facility] in a while.

## 2019-10-01 NOTE — Telephone Encounter (Signed)
Email sent to [facility] with [facility] to advise that we did not receive any paperwork on this pt and if she would like to resend then I can have Dr. Sharol Given to sign.

## 2019-10-21 ENCOUNTER — Encounter: Payer: Self-pay | Admitting: Podiatry

## 2019-10-21 ENCOUNTER — Other Ambulatory Visit: Payer: Self-pay

## 2019-10-21 ENCOUNTER — Ambulatory Visit (INDEPENDENT_AMBULATORY_CARE_PROVIDER_SITE_OTHER): Payer: Medicare Other | Admitting: Podiatry

## 2019-10-21 VITALS — BP 130/73 | HR 80

## 2019-10-21 DIAGNOSIS — B351 Tinea unguium: Secondary | ICD-10-CM

## 2019-10-21 DIAGNOSIS — E785 Hyperlipidemia, unspecified: Secondary | ICD-10-CM

## 2019-10-21 DIAGNOSIS — Z89422 Acquired absence of other left toe(s): Secondary | ICD-10-CM

## 2019-10-21 DIAGNOSIS — M79675 Pain in left toe(s): Secondary | ICD-10-CM | POA: Diagnosis not present

## 2019-10-21 DIAGNOSIS — M79674 Pain in right toe(s): Secondary | ICD-10-CM | POA: Diagnosis not present

## 2019-10-21 DIAGNOSIS — E1169 Type 2 diabetes mellitus with other specified complication: Secondary | ICD-10-CM | POA: Diagnosis not present

## 2019-10-21 NOTE — Progress Notes (Addendum)
This patient presents to the office with chief complaint of long thick nails and diabetic feet.  This patient  says there  is  no pain and discomfort in his  feet.  This patient says there are long thick painful nails.  These nails are painful walking and wearing shoes.  Patient has history of amputation fifth ray left foot.   Patient is unable to  self treat his own nails . This patient presents  to the office today for treatment of the  long nails and a foot evaluation due to history of  Diabetes. Patient has history of Sturge-Weber syndrome.  General Appearance  Alert, conversant and in no acute stress.  Vascular  Dorsalis pedis and posterior tibial  pulses are palpable  bilaterally.  Capillary return is within normal limits  bilaterally. Temperature is within normal limits  bilaterally.  Neurologic  Senn-Weinstein monofilament wire test within normal limits  bilaterally. Muscle power within normal limits bilaterally.  Nails Thick disfigured discolored nails with subungual debris  from hallux to fifth toes bilaterally except fifth toenail left foot.. No evidence of bacterial infection or drainage bilaterally.  Orthopedic  No limitations of motion of motion feet .  No crepitus or effusions noted.  No bony pathology or digital deformities noted. Amputation fifth ray left foot.  Skin  normotropic skin with no porokeratosis noted bilaterally.  No signs of infections or ulcers noted.     Onychomycosis  Diabetes with no foot complications  IE  Debride nails x 9.  A diabetic foot exam was performed and there is no evidence of any vascular or neurologic pathology.   RTC 3 months.   Gardiner Barefoot DPM

## 2019-12-30 ENCOUNTER — Other Ambulatory Visit: Payer: Self-pay

## 2019-12-30 ENCOUNTER — Ambulatory Visit (INDEPENDENT_AMBULATORY_CARE_PROVIDER_SITE_OTHER): Payer: Medicare Other

## 2019-12-30 ENCOUNTER — Ambulatory Visit (INDEPENDENT_AMBULATORY_CARE_PROVIDER_SITE_OTHER): Payer: Medicare Other | Admitting: Podiatry

## 2019-12-30 DIAGNOSIS — Z89422 Acquired absence of other left toe(s): Secondary | ICD-10-CM | POA: Diagnosis not present

## 2019-12-30 MED ORDER — MELOXICAM 15 MG PO TABS: 15.0000 mg | ORAL_TABLET | Freq: Every day | ORAL | 1 refills | Status: DC

## 2020-01-03 NOTE — Progress Notes (Signed)
   HPI: 64 y.o. male presenting today with a chief complaint of shooting pain to the left medial foot that began 2-3 weeks ago. He reports associated swelling. Walking increases the pain. He has been wearing his DM shoes as directed. Patient is here for further evaluation and treatment.   Past Medical History:  Diagnosis Date  . Diabetes ([place])   . Glaucoma   . Hemiparesis ([place])   . Hypercholesterolemia   . Hyperlipemia   . Melanoma ([place])   . Melanoma ([place])   . Obesity   . OSA (obstructive sleep apnea)   . Seizure ([place])   . Seizure disorder ([place])   . Sturge syndrome ([place])   . Sturge-Weber syndrome [facility])      Physical Exam: General: The patient is alert and oriented x3 in no acute distress.  Dermatology: Skin is warm, dry and supple bilateral lower extremities. Negative for open lesions or macerations.  Vascular: Palpable pedal pulses bilaterally. No edema or erythema noted. Capillary refill within normal limits.  Neurological: Epicritic and protective threshold grossly intact bilaterally.   Musculoskeletal Exam: Pain with palpation noted to the left foot. Range of motion within normal limits to all pedal and ankle joints bilateral. Muscle strength 5/5 in all groups bilateral.   Radiographic Exam:  Normal osseous mineralization. Joint spaces preserved. No fracture/dislocation/boney destruction.    Assessment: 1. Left foot capsulitis  2. H/o left fifth ray amputation DOS 07/2019   Plan of Care:  1. Patient evaluated. X-Rays reviewed.  2. Compression anklet dispensed.  3. Continue wearing DM shoes and brace.  4. Prescription for Meloxicam 15 mg provided to patient. 5. Return to clinic at next scheduled appointment.      Edrick Kins, DPM [facility]  Dr. Edrick Kins, DPM    [address] [place], [place] [address]                Office [phone]  Fax [phone]

## 2020-01-20 ENCOUNTER — Other Ambulatory Visit: Payer: Self-pay

## 2020-01-20 ENCOUNTER — Encounter: Payer: Self-pay | Admitting: Podiatry

## 2020-01-20 ENCOUNTER — Ambulatory Visit (INDEPENDENT_AMBULATORY_CARE_PROVIDER_SITE_OTHER): Payer: Medicare Other | Admitting: Podiatry

## 2020-01-20 VITALS — Temp 97.7°F

## 2020-01-20 DIAGNOSIS — Z89422 Acquired absence of other left toe(s): Secondary | ICD-10-CM

## 2020-01-20 DIAGNOSIS — B351 Tinea unguium: Secondary | ICD-10-CM

## 2020-01-20 DIAGNOSIS — E785 Hyperlipidemia, unspecified: Secondary | ICD-10-CM

## 2020-01-20 DIAGNOSIS — E1169 Type 2 diabetes mellitus with other specified complication: Secondary | ICD-10-CM | POA: Diagnosis not present

## 2020-01-20 DIAGNOSIS — M79675 Pain in left toe(s): Secondary | ICD-10-CM

## 2020-01-20 DIAGNOSIS — M79674 Pain in right toe(s): Secondary | ICD-10-CM

## 2020-01-20 NOTE — Progress Notes (Signed)
This patient returns to my office for at risk foot care.  This patient requires this care by a professional since this patient will be at risk due to having diabetes  This patient is unable to cut nails himself since the patient cannot reach his nails.These nails are painful walking and wearing shoes.  This patient presents for at risk foot care today.  General Appearance  Alert, conversant and in no acute stress.  Vascular  Dorsalis pedis and posterior tibial  pulses are palpable  bilaterally.  Capillary return is within normal limits  bilaterally. Temperature is within normal limits  bilaterally.  Neurologic  Senn-Weinstein monofilament wire test within normal limits  bilaterally. Muscle power within normal limits bilaterally.  Nails Thick disfigured discolored nails with subungual debris  from hallux to fifth toes bilaterally except fifth toe left foot.. No evidence of bacterial infection or drainage bilaterally.  Orthopedic  No limitations of motion  feet .  No crepitus or effusions noted.  No bony pathology or digital deformities noted. Amputation 5th met /toe left foot.  Skin  normotropic skin with no porokeratosis noted bilaterally.  No signs of infections or ulcers noted.     Onychomycosis  Pain in right toes  Pain in left toes  Consent was obtained for treatment procedures.   Mechanical debridement of nails 1-5  bilaterally performed with a nail nipper.  Filed with dremel without incident.    Return office visit    3 months                  Told patient to return for periodic foot care and evaluation due to potential at risk complications.   Gardiner Barefoot DPM

## 2020-02-15 ENCOUNTER — Ambulatory Visit (INDEPENDENT_AMBULATORY_CARE_PROVIDER_SITE_OTHER): Payer: Medicare Other | Admitting: Podiatry

## 2020-02-15 ENCOUNTER — Encounter: Payer: Self-pay | Admitting: Podiatry

## 2020-02-15 ENCOUNTER — Other Ambulatory Visit: Payer: Self-pay

## 2020-02-15 DIAGNOSIS — M216X9 Other acquired deformities of unspecified foot: Secondary | ICD-10-CM

## 2020-02-15 DIAGNOSIS — E785 Hyperlipidemia, unspecified: Secondary | ICD-10-CM

## 2020-02-15 DIAGNOSIS — M722 Plantar fascial fibromatosis: Secondary | ICD-10-CM | POA: Diagnosis not present

## 2020-02-15 DIAGNOSIS — M79672 Pain in left foot: Secondary | ICD-10-CM

## 2020-02-15 DIAGNOSIS — E1169 Type 2 diabetes mellitus with other specified complication: Secondary | ICD-10-CM

## 2020-02-15 NOTE — Progress Notes (Signed)
Subjective:  Patient ID: Xavier Turner, male    DOB: May 31, 1956,  MRN: [id number]  No chief complaint on file.   64 y.o. male presents with the above complaint.  Patient presents with left heel pain that has been going on for last couple months has progressive gotten worse.  Patient states his pain when ambulating.  Pain with pressure.  Patient experiences post static dyskinesia type of symptoms.  Patient has not been treated for plantar fasciitis.  He has not previously gotten injection to this area.  He denies any other acute complaints.  He has not seen any other further provider for plantar fasciitis.  Review of Systems: Negative except as noted in the HPI. Denies N/V/F/Ch.  Past Medical History:  Diagnosis Date  . Diabetes ([place])   . Glaucoma   . Hemiparesis ([place])   . Hypercholesterolemia   . Hyperlipemia   . Melanoma ([place])   . Melanoma ([place])   . Obesity   . OSA (obstructive sleep apnea)   . Seizure ([place])   . Seizure disorder ([place])   . 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  ARTIFICIAL TEAR OP, Place 1 drop into both eyes 4 (four) times daily as needed (dry eyes). , Disp: , Rfl:  .  aspirin 81 MG tablet, Take 81 mg by mouth daily.  , Disp: , Rfl:  .  brimonidine (ALPHAGAN) 0.2 % ophthalmic solution, Place 1 drop into both eyes 3 (three) times daily., Disp: 5 mL, Rfl: 0 .  cholecalciferol (VITAMIN D3) 25 MCG (1000 UT) tablet, Take 1 tablet (1,000 Units total) by mouth daily., Disp: 30 tablet, Rfl: 0 .  clotrimazole-betamethasone (LOTRISONE) cream, APPLY TO AFFECTED AREA TWICE A DAY, Disp: , Rfl:  .  dorzolamide-timolol (COSOPT) 22.3-6.8 MG/ML ophthalmic solution, Place 1 drop into the right eye 2 (two) times daily., Disp: 10 mL, Rfl: 0 .  doxycycline (VIBRAMYCIN) 100 MG capsule, Take 1 capsule (100 mg total) by mouth 2 (two) times daily., Disp: 20 capsule, Rfl: 0 .  Emollient (EUCERIN SKIN CALMING) CREA, Apply 1 application  topically as needed (dry skin)., Disp: , Rfl:  .  escitalopram (LEXAPRO) 10 MG tablet, Take 1 tablet (10 mg total) by mouth daily., Disp: 30 tablet, Rfl: 0 .  gabapentin (NEURONTIN) 100 MG capsule, Take 1 capsule (100 mg total) by mouth 2 (two) times daily., Disp: 60 capsule, Rfl: 0 .  hydrochlorothiazide (HYDRODIURIL) 25 MG tablet, Take 1 tablet (25 mg total) by mouth daily., Disp: 30 tablet, Rfl: 0 .  meloxicam (MOBIC) 15 MG tablet, Take 1 tablet (15 mg total) by mouth daily., Disp: 30 tablet, Rfl: 1 .  metFORMIN (GLUCOPHAGE) 1000 MG tablet, Take 1 tablet (1,000 mg total) by mouth 2 (two) times daily with a meal., Disp: 60 tablet, Rfl: 0 .  Misc. Devices MISC, Please supply patient with shower chair with U-shaped seat., Disp: , Rfl:  .  nystatin (MYCOSTATIN/NYSTOP) powder, Use daily in L axilla prn., Disp: , Rfl:  .  omeprazole (PRILOSEC) 20 MG capsule, Take 1 capsule (20 mg total) by mouth daily., Disp: 30 capsule, Rfl: 0 .  pantoprazole (PROTONIX) 40 MG tablet, Take 40 mg by mouth daily., Disp: , Rfl:  .  RHOPRESSA 0.02 % SOLN, , Disp: , Rfl:  .  ROCKLATAN 0.02-0.005 % SOLN, Place 1 drop into both eyes at bedtime., Disp: 2.5 mL, Rfl: 0 .  rosuvastatin (CRESTOR) 20 MG tablet, Take 1  tablet (20 mg total) by mouth daily., Disp: 30 tablet, Rfl: 0 .  sitaGLIPtin (JANUVIA) 100 MG tablet, Take 1 tablet (100 mg total) by mouth daily., Disp: 30 tablet, Rfl: 0 .  tamsulosin (FLOMAX) 0.4 MG CAPS capsule, Take 1 capsule (0.4 mg total) by mouth daily., Disp: 30 capsule, Rfl: 0  Social History   Tobacco Use  Smoking Status Never Smoker  Smokeless Tobacco Never Used    No Known Allergies Objective:  There were no vitals filed for this visit. There is no height or weight on file to calculate BMI. Constitutional Well developed. Well nourished.  Vascular Dorsalis pedis pulses palpable bilaterally. Posterior tibial pulses palpable bilaterally. Capillary refill normal to all digits.  No cyanosis or  clubbing noted. Pedal hair growth normal.  Neurologic Normal speech. Oriented to person, place, and time. Epicritic sensation to light touch grossly present bilaterally.  Dermatologic Nails well groomed and normal in appearance. No open wounds. No skin lesions.  Orthopedic: Normal joint ROM without pain or crepitus bilaterally. No visible deformities. Tender to palpation at the calcaneal tuber left. No pain with calcaneal squeeze left. Ankle ROM diminished range of motion left. Silfverskiold Test: positive left.  Equinus present to bilateral lower extremity.   Radiographs: Taken and reviewed. No acute fractures or dislocations. No evidence of stress fracture.  Plantar heel spur present. Posterior heel spur present.  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  Patient has a history of partial fifth ray amputation.  Cavovarus adductus foot type noted.  Assessment:   1. Type 2 diabetes mellitus with hyperlipidemia ([place])   2. Plantar fasciitis, left   3. Pain of left heel    Plan:  Patient was evaluated and treated and all questions answered.  Plantar Fasciitis, left secondary to cavus adductovarus foot type - XR reviewed as above.  - Educated on icing and stretching. Instructions given.  - Injection delivered to the plantar fascia as below. - DME: Plantar Fascial Brace - Pharmacologic management: Meloxicam -I discussed with the patient in extensive detail that the etiology of plantar fasciitis is likely not treated.  The cable adductovarus foot type from history of partial fifth ray amputation site and decrease in the inversion pull off the peroneal tendons.  Procedure: Injection Tendon/Ligament Location: Left plantar fascia at the glabrous junction; medial approach. Skin Prep: alcohol Injectate: 0.5 cc 0.5% marcaine plain, 0.5 cc of 1% Lidocaine, 0.5 cc kenalog 10. Disposition: Patient tolerated procedure well. Injection site dressed with a band-aid.  No follow-ups on file.

## 2020-03-16 ENCOUNTER — Ambulatory Visit: Payer: Medicare Other | Admitting: Podiatry

## 2020-04-11 ENCOUNTER — Other Ambulatory Visit: Payer: Self-pay

## 2020-04-11 ENCOUNTER — Ambulatory Visit (INDEPENDENT_AMBULATORY_CARE_PROVIDER_SITE_OTHER): Payer: Medicare Other | Admitting: Podiatry

## 2020-04-11 DIAGNOSIS — E785 Hyperlipidemia, unspecified: Secondary | ICD-10-CM

## 2020-04-11 DIAGNOSIS — E1169 Type 2 diabetes mellitus with other specified complication: Secondary | ICD-10-CM | POA: Diagnosis not present

## 2020-04-11 DIAGNOSIS — M7732 Calcaneal spur, left foot: Secondary | ICD-10-CM

## 2020-04-11 DIAGNOSIS — M722 Plantar fascial fibromatosis: Secondary | ICD-10-CM

## 2020-04-11 DIAGNOSIS — M79672 Pain in left foot: Secondary | ICD-10-CM

## 2020-04-12 ENCOUNTER — Encounter: Payer: Self-pay | Admitting: Podiatry

## 2020-04-12 NOTE — Progress Notes (Signed)
Subjective:  Patient ID: Xavier Turner, male    DOB: [birth year],  MRN: [id number]  Chief Complaint  Patient presents with  . Follow-up    L plantar fasciitis. Pt stated, "It's fine. 5/10 pain today". Caregiver reported that pt takes an "increased" dose of meloxicam daily.    64 y.o. male presents with the above complaint.  Patient presents with a follow-up of left heel pain consistent with plantar fasciitis.  Patient states pain has improved considerably.  He has been wearing the brace as well as the injection helped a lot.  He would like to know what his next treatment options are as he still continues to have a lot of pain.  He denies any other acute complaints.  It is greater than 50% resolved.  Review of Systems: Negative except as noted in the HPI. Denies N/V/F/Ch.  Past Medical History:  Diagnosis Date  . Diabetes ([place])   . Glaucoma   . Hemiparesis ([place])   . Hypercholesterolemia   . Hyperlipemia   . Melanoma ([place])   . Melanoma ([place])   . Obesity   . OSA (obstructive sleep apnea)   . Seizure ([place])   . Seizure disorder ([place])   . 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  ARTIFICIAL TEAR OP, Place 1 drop into both eyes 4 (four) times daily as needed (dry eyes). , Disp: , Rfl:  .  aspirin 81 MG tablet, Take 81 mg by mouth daily.  , Disp: , Rfl:  .  brimonidine (ALPHAGAN) 0.2 % ophthalmic solution, Place 1 drop into both eyes 3 (three) times daily., Disp: 5 mL, Rfl: 0 .  cholecalciferol (VITAMIN D3) 25 MCG (1000 UT) tablet, Take 1 tablet (1,000 Units total) by mouth daily., Disp: 30 tablet, Rfl: 0 .  clotrimazole-betamethasone (LOTRISONE) cream, APPLY TO AFFECTED AREA TWICE A DAY, Disp: , Rfl:  .  dorzolamide-timolol (COSOPT) 22.3-6.8 MG/ML ophthalmic solution, Place 1 drop into the right eye 2 (two) times daily., Disp: 10 mL, Rfl: 0 .  doxycycline (VIBRAMYCIN) 100 MG capsule, Take 1 capsule (100 mg total) by mouth 2 (two) times  daily., Disp: 20 capsule, Rfl: 0 .  Emollient (EUCERIN SKIN CALMING) CREA, Apply 1 application topically as needed (dry skin)., Disp: , Rfl:  .  escitalopram (LEXAPRO) 10 MG tablet, Take 1 tablet (10 mg total) by mouth daily., Disp: 30 tablet, Rfl: 0 .  gabapentin (NEURONTIN) 100 MG capsule, Take 1 capsule (100 mg total) by mouth 2 (two) times daily., Disp: 60 capsule, Rfl: 0 .  hydrochlorothiazide (HYDRODIURIL) 25 MG tablet, Take 1 tablet (25 mg total) by mouth daily., Disp: 30 tablet, Rfl: 0 .  meloxicam (MOBIC) 15 MG tablet, Take 1 tablet (15 mg total) by mouth daily., Disp: 30 tablet, Rfl: 1 .  metFORMIN (GLUCOPHAGE) 1000 MG tablet, Take 1 tablet (1,000 mg total) by mouth 2 (two) times daily with a meal., Disp: 60 tablet, Rfl: 0 .  Misc. Devices MISC, Please supply patient with shower chair with U-shaped seat., Disp: , Rfl:  .  nystatin (MYCOSTATIN/NYSTOP) powder, Use daily in L axilla prn., Disp: , Rfl:  .  omeprazole (PRILOSEC) 20 MG capsule, Take 1 capsule (20 mg total) by mouth daily., Disp: 30 capsule, Rfl: 0 .  pantoprazole (PROTONIX) 40 MG tablet, Take 40 mg by mouth daily., Disp: , Rfl:  .  RHOPRESSA 0.02 % SOLN, , Disp: , Rfl:  .  ROCKLATAN 0.02-0.005 %  SOLN, Place 1 drop into both eyes at bedtime., Disp: 2.5 mL, Rfl: 0 .  rosuvastatin (CRESTOR) 20 MG tablet, Take 1 tablet (20 mg total) by mouth daily., Disp: 30 tablet, Rfl: 0 .  sitaGLIPtin (JANUVIA) 100 MG tablet, Take 1 tablet (100 mg total) by mouth daily., Disp: 30 tablet, Rfl: 0 .  tamsulosin (FLOMAX) 0.4 MG CAPS capsule, Take 1 capsule (0.4 mg total) by mouth daily., Disp: 30 capsule, Rfl: 0  Social History   Tobacco Use  Smoking Status Never Smoker  Smokeless Tobacco Never Used    No Known Allergies Objective:  There were no vitals filed for this visit. There is no height or weight on file to calculate BMI. Constitutional Well developed. Well nourished.  Vascular Dorsalis pedis pulses palpable  bilaterally. Posterior tibial pulses palpable bilaterally. Capillary refill normal to all digits.  No cyanosis or clubbing noted. Pedal hair growth normal.  Neurologic Normal speech. Oriented to person, place, and time. Epicritic sensation to light touch grossly present bilaterally.  Dermatologic Nails well groomed and normal in appearance. No open wounds. No skin lesions.  Orthopedic: Normal joint ROM without pain or crepitus bilaterally. No visible deformities. Tender to palpation at the calcaneal tuber left. No pain with calcaneal squeeze left. Ankle ROM diminished range of motion left. Silfverskiold Test: positive left.  Equinus present to bilateral lower extremity.   Radiographs: Taken and reviewed. No acute fractures or dislocations. No evidence of stress fracture.  Plantar heel spur present. Posterior heel spur present.  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  Patient has a history of partial fifth ray amputation.  Cavovarus adductus foot type noted.  Assessment:   1. Type 2 diabetes mellitus with hyperlipidemia ([place])   2. Plantar fasciitis, left   3. Pain of left heel   4. Heel spur, left    Plan:  Patient was evaluated and treated and all questions answered.  Plantar Fasciitis, left secondary to cavus adductovarus foot type - XR reviewed as above.  - Educated on icing and stretching. Instructions given.  -Second injection delivered to the plantar fascia as below. - DME: Night splint - Pharmacologic management: Meloxicam -I discussed with the patient in extensive detail that the etiology of plantar fasciitis is likely not treated.  The cable adductovarus foot type from history of partial fifth ray amputation site and decrease in the inversion pull off the peroneal tendons.  Procedure: Injection Tendon/Ligament Location: Left plantar fascia at the glabrous junction; medial approach. Skin Prep: alcohol Injectate: 0.5 cc 0.5% marcaine plain, 0.5 cc of 1% Lidocaine,  0.5 cc kenalog 10. Disposition: Patient tolerated procedure well. Injection site dressed with a band-aid.  No follow-ups on file.

## 2020-04-22 ENCOUNTER — Ambulatory Visit: Payer: Medicare Other | Admitting: Podiatry

## 2020-07-13 ENCOUNTER — Encounter: Payer: Self-pay | Admitting: Podiatry

## 2020-07-13 ENCOUNTER — Ambulatory Visit (INDEPENDENT_AMBULATORY_CARE_PROVIDER_SITE_OTHER): Payer: Medicare Other | Admitting: Podiatry

## 2020-07-13 ENCOUNTER — Other Ambulatory Visit: Payer: Self-pay

## 2020-07-13 DIAGNOSIS — Z89422 Acquired absence of other left toe(s): Secondary | ICD-10-CM | POA: Diagnosis not present

## 2020-07-13 DIAGNOSIS — M79675 Pain in left toe(s): Secondary | ICD-10-CM

## 2020-07-13 DIAGNOSIS — E1169 Type 2 diabetes mellitus with other specified complication: Secondary | ICD-10-CM | POA: Diagnosis not present

## 2020-07-13 DIAGNOSIS — M79674 Pain in right toe(s): Secondary | ICD-10-CM

## 2020-07-13 DIAGNOSIS — E785 Hyperlipidemia, unspecified: Secondary | ICD-10-CM

## 2020-07-13 DIAGNOSIS — B351 Tinea unguium: Secondary | ICD-10-CM | POA: Diagnosis not present

## 2020-07-13 NOTE — Progress Notes (Signed)
This patient returns to my office for at risk foot care.  This patient requires this care by a professional since this patient will be at risk due to having diabetes  This patient is unable to cut nails himself since the patient cannot reach his nails.These nails are painful walking and wearing shoes.  This patient presents for at risk foot care today.  General Appearance  Alert, conversant and in no acute stress.  Vascular  Dorsalis pedis and posterior tibial  pulses are palpable  bilaterally.  Capillary return is within normal limits  bilaterally. Temperature is within normal limits  bilaterally.  Neurologic  Senn-Weinstein monofilament wire test within normal limits  bilaterally. Muscle power within normal limits bilaterally.  Nails Thick disfigured discolored nails with subungual debris  from hallux to fourth toenail left and 2-5 right foot.  . No evidence of bacterial infection or drainage bilaterally.  Orthopedic  No limitations of motion  feet .  No crepitus or effusions noted.  No bony pathology or digital deformities noted. Amputation 5th met /toe left foot.  Skin  normotropic skin with no porokeratosis noted bilaterally.  No signs of infections or ulcers noted.     Onychomycosis  Pain in right toes  Pain in left toes  Consent was obtained for treatment procedures.   Mechanical debridement of nails 1-5  bilaterally performed with a nail nipper.  Filed with dremel without incident.    Return office visit    3 months                  Told patient to return for periodic foot care and evaluation due to potential at risk complications.   Gardiner Barefoot DPM

## 2020-10-18 ENCOUNTER — Other Ambulatory Visit: Payer: Self-pay

## 2020-10-18 ENCOUNTER — Encounter: Payer: Self-pay | Admitting: Podiatry

## 2020-10-18 ENCOUNTER — Ambulatory Visit (INDEPENDENT_AMBULATORY_CARE_PROVIDER_SITE_OTHER): Payer: Medicare Other | Admitting: Podiatry

## 2020-10-18 DIAGNOSIS — M79675 Pain in left toe(s): Secondary | ICD-10-CM | POA: Diagnosis not present

## 2020-10-18 DIAGNOSIS — M79674 Pain in right toe(s): Secondary | ICD-10-CM

## 2020-10-18 DIAGNOSIS — B351 Tinea unguium: Secondary | ICD-10-CM | POA: Diagnosis not present

## 2020-10-18 DIAGNOSIS — E1169 Type 2 diabetes mellitus with other specified complication: Secondary | ICD-10-CM

## 2020-10-18 DIAGNOSIS — Z89422 Acquired absence of other left toe(s): Secondary | ICD-10-CM

## 2020-10-18 DIAGNOSIS — E785 Hyperlipidemia, unspecified: Secondary | ICD-10-CM

## 2020-10-18 NOTE — Progress Notes (Signed)
This patient returns to my office for at risk foot care.  This patient requires this care by a professional since this patient will be at risk due to having diabetes  This patient is unable to cut nails himself since the patient cannot reach his nails.These nails are painful walking and wearing shoes.  This patient presents for at risk foot care today.  General Appearance  Alert, conversant and in no acute stress.  Vascular  Dorsalis pedis and posterior tibial  pulses are palpable  bilaterally.  Capillary return is within normal limits  bilaterally. Temperature is within normal limits  Bilaterally. Venous stasis legs  B/L.  Neurologic  Senn-Weinstein monofilament wire test within normal limits  bilaterally. Muscle power within normal limits bilaterally.  Nails Thick disfigured discolored nails with subungual debris  from hallux to fourth toenail left and 2-5 right foot.  . No evidence of bacterial infection or drainage bilaterally.  Orthopedic  No limitations of motion  feet .  No crepitus or effusions noted.  No bony pathology or digital deformities noted. Amputation 5th met /toe left foot.  Skin  normotropic skin with no porokeratosis noted bilaterally.  No signs of infections or ulcers noted.     Onychomycosis  Pain in right toes  Pain in left toes  Consent was obtained for treatment procedures.   Mechanical debridement of nails 1-5  bilaterally performed with a nail nipper.  Filed with dremel without incident.    Return office visit    3 months                  Told patient to return for periodic foot care and evaluation due to potential at risk complications.   Gardiner Barefoot DPM

## 2021-01-17 ENCOUNTER — Encounter: Payer: Self-pay | Admitting: Podiatry

## 2021-01-17 ENCOUNTER — Other Ambulatory Visit: Payer: Self-pay

## 2021-01-17 ENCOUNTER — Ambulatory Visit (INDEPENDENT_AMBULATORY_CARE_PROVIDER_SITE_OTHER): Payer: Medicare Other | Admitting: Podiatry

## 2021-01-17 DIAGNOSIS — M79674 Pain in right toe(s): Secondary | ICD-10-CM

## 2021-01-17 DIAGNOSIS — E785 Hyperlipidemia, unspecified: Secondary | ICD-10-CM

## 2021-01-17 DIAGNOSIS — Z89422 Acquired absence of other left toe(s): Secondary | ICD-10-CM

## 2021-01-17 DIAGNOSIS — M79675 Pain in left toe(s): Secondary | ICD-10-CM

## 2021-01-17 DIAGNOSIS — L84 Corns and callosities: Secondary | ICD-10-CM | POA: Diagnosis not present

## 2021-01-17 DIAGNOSIS — B351 Tinea unguium: Secondary | ICD-10-CM | POA: Diagnosis not present

## 2021-01-17 DIAGNOSIS — E1169 Type 2 diabetes mellitus with other specified complication: Secondary | ICD-10-CM

## 2021-01-17 NOTE — Progress Notes (Signed)
This patient returns to my office for at risk foot care.  This patient requires this care by a professional since this patient will be at risk due to having diabetes  This patient is unable to cut nails himself since the patient cannot reach his nails.These nails are painful walking and wearing shoes.  This patient presents for at risk foot care today.  General Appearance  Alert, conversant and in no acute stress.  Vascular  Dorsalis pedis and posterior tibial  pulses are palpable  bilaterally.  Capillary return is within normal limits  bilaterally. Temperature is within normal limits  Bilaterally. Venous stasis legs  B/L.  Neurologic  Senn-Weinstein monofilament wire test within normal limits  bilaterally. Muscle power within normal limits bilaterally.  Nails Thick disfigured discolored nails with subungual debris  from hallux to fourth toenail left and 2-5 right foot.  . No evidence of bacterial infection or drainage bilaterally.  Orthopedic  No limitations of motion  feet .  No crepitus or effusions noted.  No bony pathology or digital deformities noted. Amputation 5th met /toe left foot.  Skin  normotropic skin with no porokeratosis noted bilaterally.  No signs of infections or ulcers noted.  Pre-ulcerous callus fifth metabase left foot.   Onychomycosis  Pain in right toes  Pain in left toes  Pre-ulcerous callus left foot.  Consent was obtained for treatment procedures.   Mechanical debridement of nails 1-5  bilaterally performed with a nail nipper.  Filed with dremel without incident. Debride callus with #15 blade and dremel tool.  Padding applied to insole.  Patient received brace from  [facility] and needs to return for their evaluation.  Return office visit    3 months                  Told patient to return for periodic foot care and evaluation due to potential at risk complications.   Gardiner Barefoot DPM

## 2021-02-07 ENCOUNTER — Other Ambulatory Visit: Payer: Self-pay | Admitting: Gastroenterology

## 2021-02-24 ENCOUNTER — Ambulatory Visit (HOSPITAL_COMMUNITY): Admission: RE | Admit: 2021-02-24 | Payer: Medicare Other | Source: Home / Self Care | Admitting: Gastroenterology

## 2021-02-24 ENCOUNTER — Encounter (HOSPITAL_COMMUNITY): Admission: RE | Payer: Self-pay | Source: Home / Self Care

## 2021-02-24 SURGERY — ESOPHAGOGASTRODUODENOSCOPY (EGD) WITH PROPOFOL
Anesthesia: Monitor Anesthesia Care

## 2021-04-04 ENCOUNTER — Encounter: Payer: Self-pay | Admitting: Podiatry

## 2021-04-04 ENCOUNTER — Ambulatory Visit (INDEPENDENT_AMBULATORY_CARE_PROVIDER_SITE_OTHER): Payer: Medicare Other | Admitting: Podiatry

## 2021-04-04 ENCOUNTER — Other Ambulatory Visit: Payer: Self-pay

## 2021-04-04 DIAGNOSIS — M79675 Pain in left toe(s): Secondary | ICD-10-CM | POA: Diagnosis not present

## 2021-04-04 DIAGNOSIS — E1169 Type 2 diabetes mellitus with other specified complication: Secondary | ICD-10-CM | POA: Diagnosis not present

## 2021-04-04 DIAGNOSIS — Z89422 Acquired absence of other left toe(s): Secondary | ICD-10-CM | POA: Diagnosis not present

## 2021-04-04 DIAGNOSIS — E785 Hyperlipidemia, unspecified: Secondary | ICD-10-CM | POA: Diagnosis not present

## 2021-04-04 DIAGNOSIS — B351 Tinea unguium: Secondary | ICD-10-CM

## 2021-04-04 DIAGNOSIS — M79674 Pain in right toe(s): Secondary | ICD-10-CM | POA: Diagnosis not present

## 2021-04-04 DIAGNOSIS — L84 Corns and callosities: Secondary | ICD-10-CM

## 2021-04-04 MED ORDER — UREA 40 % EX CREA: 1.0000 "application " | TOPICAL_CREAM | Freq: Every day | CUTANEOUS | 2 refills | Status: DC

## 2021-04-04 NOTE — Progress Notes (Signed)
  Subjective:  Patient ID: TRES GRZYWACZ, male    DOB: 1956-03-06,  MRN: [id number]  Chief Complaint  Patient presents with   Nail Problem     small ulceration bottom of left foot with pain from previous 5th left toe amputation; diabetic foot and nail care   Diabetic Ulcer    65 y.o. male presents with the above complaint. History confirmed with patient.  Calluses became increasingly painful and needed to come in sooner than he had been scheduled.  The nails are thick and painful again as well  Objective:  Physical Exam: warm, good capillary refill, no trophic changes or ulcerative lesions, normal DP and PT pulses, and abnormal sensory exam.  Preulcerative calluses submetatarsal 4 and plantar to the cuboid, no open ulceration after debridement.  He has a history of left fifth ray amputation.  Thickened elongated nails x9 with yellow discoloration and subungual debris  Assessment:   1. Pre-ulcerative calluses   2. Pain due to onychomycosis of toenails of both feet   3. 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 Type 2 diabetes mellitus with hyperlipidemia ([place])      Plan:  Patient was evaluated and treated and all questions answered.  Patient educated on diabetes. Discussed proper diabetic foot care and discussed risks and complications of disease. Educated patient in depth on reasons to return to the office immediately should he/she discover anything concerning or new on the feet. All questions answered. Discussed proper shoes as well.   Discussed the etiology and treatment options for the condition in detail with the patient. Educated patient on the topical and oral treatment options for mycotic nails. Recommended debridement of the nails today. Sharp and mechanical debridement performed of all painful and mycotic nails today. Nails debrided in length and thickness using a nail nipper to level of comfort. Discussed treatment options including appropriate shoe  gear. Follow up as needed for painful nails.  All symptomatic hyperkeratoses were safely debrided with a sterile #15 blade to patient's level of comfort without incident. We discussed preventative and palliative care of these lesions including supportive and accommodative shoegear, padding, prefabricated and custom molded accommodative orthoses, use of a pumice stone and lotions/creams daily. Recommended urea cream and dispensed offloading dancers pad  Return in about 3 months (around 07/05/2021) for at risk diabetic foot care.

## 2021-04-19 ENCOUNTER — Ambulatory Visit: Payer: Medicare Other | Admitting: Podiatry

## 2021-04-25 ENCOUNTER — Ambulatory Visit (INDEPENDENT_AMBULATORY_CARE_PROVIDER_SITE_OTHER): Payer: Medicare Other | Admitting: Podiatry

## 2021-04-25 ENCOUNTER — Other Ambulatory Visit: Payer: Self-pay

## 2021-04-25 DIAGNOSIS — M7752 Other enthesopathy of left foot: Secondary | ICD-10-CM | POA: Diagnosis not present

## 2021-04-25 DIAGNOSIS — M216X9 Other acquired deformities of unspecified foot: Secondary | ICD-10-CM | POA: Diagnosis not present

## 2021-04-25 MED ORDER — CEFADROXIL 500 MG PO CAPS: 500.0000 mg | ORAL_CAPSULE | Freq: Two times a day (BID) | ORAL | 0 refills | Status: DC

## 2021-04-30 ENCOUNTER — Encounter: Payer: Self-pay | Admitting: Podiatry

## 2021-04-30 NOTE — Progress Notes (Signed)
  Subjective:  Patient ID: Xavier Turner, male    DOB: 1956/05/09,  MRN: [id number]  Chief Complaint  Patient presents with   Foot Pain    left foot-mass has developed on the bottom    65 y.o. male returns with the above complaint. History confirmed with patient.  Area on the bottom of his foot has become very painful for him, this is a new issue  Objective:  Physical Exam: warm, good capillary refill, no trophic changes or ulcerative lesions, normal DP and PT pulses, and abnormal sensory exam.  He has severe pes cavus foot deformity.  Prominent area of tenderness over the fifth metatarsal base plantarly.  No cellulitis.  No open lesions.  No wounds. Assessment:   1. Bursitis of left foot   2. Cavus foot, acquired       Plan:  Patient was evaluated and treated and all questions answered.  Discussed with the patient and his caretaker that he appears to have bursitis of a subcutaneous adventitial bursa plantar to the fifth metatarsal which is common and pes cavus foot deformity.  Recommended injection therapy.  I discussed the rationale behind this and why this bursitis develops with his foot deformity.  Discussed offloading with padding as well.  Return as needed for this.  Injection was performed following sterile prep with alcohol with 1 cc of 2% lidocaine plain, 5 mg of Kenalog and 2 mg of dexamethasone phosphate.  He tolerated this well and it was dressed with a Band-Aid   Return if symptoms worsen or fail to improve.

## 2021-06-05 ENCOUNTER — Telehealth: Payer: Self-pay | Admitting: Podiatry

## 2021-06-05 NOTE — Telephone Encounter (Signed)
Follow up apportionment has been made with . Dr. Rolley Sims

## 2021-06-05 NOTE — Telephone Encounter (Signed)
Patient's group home manager called about his left foot stating he has a knot on the side of his foot

## 2021-06-16 ENCOUNTER — Ambulatory Visit (INDEPENDENT_AMBULATORY_CARE_PROVIDER_SITE_OTHER): Payer: Medicare Other | Admitting: Podiatrist

## 2021-06-16 ENCOUNTER — Encounter: Payer: Self-pay | Admitting: Podiatrist

## 2021-06-16 ENCOUNTER — Other Ambulatory Visit: Payer: Self-pay

## 2021-06-16 DIAGNOSIS — M216X9 Other acquired deformities of unspecified foot: Secondary | ICD-10-CM

## 2021-06-16 DIAGNOSIS — L84 Corns and callosities: Secondary | ICD-10-CM | POA: Diagnosis not present

## 2021-06-16 NOTE — Progress Notes (Signed)
  Chief Complaint  Patient presents with   Foot Pain    Left foot mass on the side of foot causing discomfort and pain mostly when pressure is applied      HPI: Patient is 65 y.o. male who presents today for pain on the lateral side of the left foot with pressure.  He recently got a new pair of diabetic shoes with double upright bracing.  He relates he had an injection at the last visit which helped for a little while.  He has had a fifth ray amputation in the past for a non healing ulcer. He did well after the procedure and healed without complication.    No Known Allergies  Review of Systems No fevers, chills, nausea, muscle aches, no difficulty breathing, no calf pain, no chest pain or shortness of breath.   Physical Exam  GENERAL APPEARANCE: Alert, conversant. Appropriately groomed. No acute distress.   VASCULAR: Pedal pulses palpable DP and PT bilateral.  Capillary refill time is immediate to all digits,  Proximal to distal cooling it warm to warm.  Digital perfusion adequate.   NEUROLOGIC: sensation is decreased to 5.07 monofilament at 2/5 sites bilateral.  Light touch is intact bilateral, vibratory sensation intact bilateral  MUSCULOSKELETAL: acceptable muscle strength, tone and stability bilateral.  Fifth ray amputation noted on the left foot.  cavus foot type noted with a prominent fourth metatarsal and cuboid present laterally with pain with pressure.   DERMATOLOGIC: skin is warm, supple, and dry.  No open lesions noted.  Callus present submet 4 and sub cuboid of the left foot.  Hemorrhagic pre ulcerative appearance of the calluses are noted.  No redness, swelling, drainage or sign of infection is noted.  Discomfort in these areas is also noted with palpation.  Digital nails appear mycotic.   Assessment   1. Cavus foot, acquired   2. Pre-ulcerative calluses      Plan  Discussed treatment options and alternatives with the patient and the caregiver.  At today's visit I  recommended trimming the callus and applying padding to the area this was carried out today with a 15 blade without complication.  An offloading pad was applied.  Recommended he try this for couple days it if it still painful he may want to speak with the orthotist to offload the fourth metatarsal and cuboid region of the left foot within the insert itself.  I also trim the nails today as a courtesy.  He will be seen back in 3 months for preventive diabetic foot care and if any problems arise prior to that visit he is instructed to call.

## 2021-06-16 NOTE — Patient Instructions (Signed)
Corns and Calluses Corns are small areas of thickened skin that form on the top, sides, or tip of a toe. Corns have a cone-shaped core with a point that can press on a nerve below. This causes pain. Calluses are areas of thickened skin that can form anywhere on the body, including the hands, fingers, palms, soles of the feet, and heels. Calluses are usually larger than corns. What are the causes? Corns and calluses are caused by rubbing (friction) or pressure, such as from shoes that are too tight or do not fit properly. What increases the risk? Corns are more likely to develop in people who have misshapen toes (toe deformities), such as hammer toes. Calluses can form with friction to any area of the skin. They are more likely to develop in people who: Work with their hands. Wear shoes that fit poorly, are too tight, or are high-heeled. Have toe deformities. What are the signs or symptoms? Symptoms of a corn or callus include: A hard growth on the skin. Pain or tenderness under the skin. Redness and swelling. Increased discomfort while wearing tight-fitting shoes, if your feet are affected. If a corn or callus becomes infected, symptoms may include: Redness and swelling that gets worse. Pain. Fluid, blood, or pus draining from the corn or callus. How is this diagnosed? Corns and calluses may be diagnosed based on your symptoms, your medical history, and a physical exam. How is this treated? Treatment for corns and calluses may include: Removing the cause of the friction or pressure. This may involve: Changing your shoes. Wearing shoe inserts (orthotics) or other protective layers in your shoes, such as a corn pad. Wearing gloves. Applying medicine to the skin (topical medicine) to help soften skin in the hardened, thickened areas. Removing layers of dead skin with a file to reduce the size of the corn or callus. Removing the corn or callus with a scalpel or laser. Taking antibiotic  medicines, if your corn or callus is infected. Having surgery, if a toe deformity is the cause. Follow these instructions at home:  Take over-the-counter and prescription medicines only as told by your health care provider. If you were prescribed an antibiotic medicine, take it as told by your health care provider. Do not stop taking it even if your condition improves. Wear shoes that fit well. Avoid wearing high-heeled shoes and shoes that are too tight or too loose. Wear any padding, protective layers, gloves, or orthotics as told by your health care provider. Soak your hands or feet. Then use a file or pumice stone to soften your corn or callus. Do this as told by your health care provider. Check your corn or callus every day for signs of infection. Contact a health care provider if: Your symptoms do not improve with treatment. You have redness or swelling that gets worse. Your corn or callus becomes painful. You have fluid, blood, or pus coming from your corn or callus. You have new symptoms. Get help right away if: You develop severe pain with redness. Summary Corns are small areas of thickened skin that form on the top, sides, or tip of a toe. These can be painful. Calluses are areas of thickened skin that can form anywhere on the body, including the hands, fingers, palms, and soles of the feet. Calluses are usually larger than corns. Corns and calluses are caused by rubbing (friction) or pressure, such as from shoes that are too tight or do not fit properly. Treatment may include wearing padding, protective   layers, gloves, or orthotics as told by your health care provider. This information is not intended to replace advice given to you by your health care provider. Make sure you discuss any questions you have with your health care provider. Document Revised: 01/07/2020 Document Reviewed: 01/07/2020 Elsevier Patient Education  2022 [employer] Inc.  

## 2021-07-10 ENCOUNTER — Ambulatory Visit: Payer: Medicare Other | Admitting: Podiatry

## 2021-07-19 DIAGNOSIS — R609 Edema, unspecified: Secondary | ICD-10-CM | POA: Insufficient documentation

## 2021-09-19 ENCOUNTER — Emergency Department (HOSPITAL_COMMUNITY)
Admission: EM | Admit: 2021-09-19 | Discharge: 2021-09-19 | Disposition: A | Discharge status: Home / Self Care | Payer: Medicare Other | Attending: Emergency Medicine | Admitting: Emergency Medicine

## 2021-09-19 ENCOUNTER — Encounter (HOSPITAL_COMMUNITY): Payer: Self-pay

## 2021-09-19 ENCOUNTER — Emergency Department (HOSPITAL_COMMUNITY): Payer: Medicare Other

## 2021-09-19 DIAGNOSIS — I10 Essential (primary) hypertension: Secondary | ICD-10-CM | POA: Diagnosis not present

## 2021-09-19 DIAGNOSIS — S0083XA Contusion of other part of head, initial encounter: Secondary | ICD-10-CM | POA: Diagnosis not present

## 2021-09-19 DIAGNOSIS — Z7982 Long term (current) use of aspirin: Secondary | ICD-10-CM | POA: Insufficient documentation

## 2021-09-19 DIAGNOSIS — Y92009 Unspecified place in unspecified non-institutional (private) residence as the place of occurrence of the external cause: Secondary | ICD-10-CM | POA: Diagnosis not present

## 2021-09-19 DIAGNOSIS — Z7984 Long term (current) use of oral hypoglycemic drugs: Secondary | ICD-10-CM | POA: Insufficient documentation

## 2021-09-19 DIAGNOSIS — S0093XA Contusion of unspecified part of head, initial encounter: Secondary | ICD-10-CM

## 2021-09-19 DIAGNOSIS — W01198A Fall on same level from slipping, tripping and stumbling with subsequent striking against other object, initial encounter: Secondary | ICD-10-CM | POA: Insufficient documentation

## 2021-09-19 DIAGNOSIS — E119 Type 2 diabetes mellitus without complications: Secondary | ICD-10-CM | POA: Diagnosis not present

## 2021-09-19 DIAGNOSIS — Z79899 Other long term (current) drug therapy: Secondary | ICD-10-CM | POA: Insufficient documentation

## 2021-09-19 DIAGNOSIS — S0990XA Unspecified injury of head, initial encounter: Secondary | ICD-10-CM | POA: Diagnosis present

## 2021-09-19 DIAGNOSIS — W010XXA Fall on same level from slipping, tripping and stumbling without subsequent striking against object, initial encounter: Secondary | ICD-10-CM

## 2021-09-19 NOTE — Discharge Instructions (Signed)
It was our pleasure to provide your ER care today - we hope that you feel better.  Fall precautions.   Return to ER if worse, new symptoms, new or severe pain, severe headache, persistent vomiting, trouble breathing, or other concern.

## 2021-09-19 NOTE — ED Triage Notes (Signed)
Patient had a witnessed fall in the kitchen. Patient state he hit his head. Patient denies being on blood thinners.

## 2021-09-19 NOTE — ED Notes (Signed)
Patient guardian informed of patient being discharge back to the facility.

## 2021-09-19 NOTE — ED Notes (Addendum)
Xavier Turner the [facility] group home is informed of patient returning to the group home.

## 2021-09-19 NOTE — ED Notes (Signed)
[facility] call to transport the patient back to the group home.

## 2021-09-19 NOTE — ED Provider Notes (Signed)
[facility] DEPT Provider Note   CSN: [id number] Arrival date & time: 09/19/21  1409     History No chief complaint on file.   Xavier Turner is a 65 y.o. male.  Patient wit hx dm, presents s/p mechanical fall, states trip and fall at home, hit head, was dazed, no loc, dull pain to area of hitting head. No faintness or dizziness prior to fall. No severe headache. No neck pain or radicular pain. No back pain. Denies chest pain or sob. No abd pain or nv. Denies extremity pain or injury, skin intact. No anticoag use.   The history is provided by the patient and medical records.      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 ([place]) 07/31/2019   Acute osteomyelitis of metatarsal bone of left foot ([place]) 07/28/2019   Hyperlipidemia associated with type 2 diabetes mellitus ([place]) 07/28/2019   Hypertension associated with diabetes ([place]) 07/28/2019   Depression 07/28/2019   Subacute osteomyelitis, left ankle and foot ([facility])    Recurrent cellulitis of lower extremity 07/21/2019   Recurrent cellulitis 07/21/2019   Lactic acidosis 07/21/2019   Hyponatremia 07/08/2019   GERD (gastroesophageal reflux disease) 07/08/2019   Type 2 diabetes mellitus with hyperlipidemia ([place]) 07/08/2019   BPH (benign prostatic hyperplasia) 07/08/2019   Cellulitis 07/06/2019   Central sleep 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 03/13/2013   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[place])    HYPERCHOLESTEROLEMIA 10/05/2009    OBSTRUCTIVE SLEEP APNEA 10/05/2009   MELANOMA, HX OF 10/05/2009   H/O diabetic foot ulcer 10/05/2009    Past Surgical History:  Procedure Laterality Date   AMPUTATION Left 07/24/2019   Procedure: AMPUTATION RAY, fifth left;  Surgeon: Newt Minion, MD;  Location: [facility];  Service: Orthopedics;  Laterality: Left;   COLONOSCOPY     CRANIOTOMY  1999   melanoma removal  2011       Family History  Problem Relation Age of Onset   Emphysema Father    Cancer Father    Asthma Mother     Social History   Tobacco Use   Smoking status: Never   Smokeless tobacco: Never    Home Medications Prior to Admission medications   Medication Sig Start Date End Date Taking? Authorizing Provider  aspirin 81 MG chewable tablet Chew 81 mg by mouth daily.    [provider]  brimonidine (ALPHAGAN) 0.2 % ophthalmic solution Place 1 drop into both eyes 3 (three) times daily. 08/28/19   Hennie Duos, MD  carboxymethylcellulose (REFRESH PLUS) 0.5 % SOLN Place 1 drop into both eyes 4 (four) times daily as needed (dry eyes).    [provider]  cholecalciferol (VITAMIN D3) 25 MCG (1000 UT) tablet Take 1 tablet (1,000 Units total) by mouth daily. Patient taking differently: Take 2,000 Units by mouth daily. 08/28/19   Hennie Duos, MD  dorzolamide-timolol (COSOPT) 22.3-6.8  MG/ML ophthalmic solution Place 1 drop into the right eye 2 (two) times daily. Patient taking differently: Place 1 drop into both eyes 2 (two) times daily. 08/28/19   Hennie Duos, MD  escitalopram (LEXAPRO) 10 MG tablet Take 1 tablet (10 mg total) by mouth daily. 08/28/19   Hennie Duos, MD  furosemide (LASIX) 20 MG tablet Take 20 mg by mouth daily.    [provider]  gabapentin (NEURONTIN) 100 MG capsule Take 1 capsule (100 mg total) by mouth 2 (two) times daily. 08/28/19   Hennie Duos, MD  hydrochlorothiazide (HYDRODIURIL) 25 MG tablet Take 1 tablet (25 mg total) by mouth daily. Patient  not taking: No sig reported 08/28/19   Hennie Duos, MD  hydrOXYzine (ATARAX/VISTARIL) 10 MG tablet Take 5 mg by mouth at bedtime as needed for itching.    [provider]  JARDIANCE 10 MG TABS tablet Take 10 mg by mouth daily. 04/03/21   [provider]  meloxicam (MOBIC) 15 MG tablet Take 1 tablet (15 mg total) by mouth daily. 12/30/19   Edrick Kins, DPM  metFORMIN (GLUCOPHAGE) 1000 MG tablet Take 1 tablet (1,000 mg total) by mouth 2 (two) times daily with a meal. 08/28/19   Hennie Duos, MD  Misc. Devices MISC Please supply patient with shower chair with U-shaped seat. 10/07/19   [provider]  nystatin (MYCOSTATIN/NYSTOP) powder Apply 1 application topically 3 (three) times daily as needed (rash). Apply groin, underarms, and belly fold    [provider]  omeprazole (PRILOSEC) 20 MG capsule Take 1 capsule (20 mg total) by mouth daily. Patient taking differently: Take 40 mg by mouth daily. 08/28/19   Hennie Duos, MD  pioglitazone (ACTOS) 15 MG tablet Take 15 mg by mouth daily. 06/28/20   [provider]  RHOPRESSA 0.02 % SOLN Place 1 drop into both eyes every evening. 09/21/19   [provider]  ROCKLATAN 0.02-0.005 % SOLN Place 1 drop into both eyes at bedtime. Patient not taking: No sig reported 08/28/19   Hennie Duos, MD  rosuvastatin (CRESTOR) 20 MG tablet Take 1 tablet (20 mg total) by mouth daily. 08/28/19   Hennie Duos, MD  sitaGLIPtin (JANUVIA) 100 MG tablet Take 1 tablet (100 mg total) by mouth daily. 08/28/19   Hennie Duos, MD  Skin Protectants, Misc. (MINERIN CREME) CREA Apply 1 application topically daily as needed (dry skin).    [provider]  tamsulosin (FLOMAX) 0.4 MG CAPS capsule Take 1 capsule (0.4 mg total) by mouth daily. 08/28/19   Hennie Duos, MD  urea (CARMOL) 40 % CREA Apply 1 application topically daily. To calluses on feet 04/04/21   Criselda Peaches, DPM    Allergies     Patient has no known allergies.  Review of Systems   Review of Systems  Constitutional:  Negative for fever.  HENT:  Negative for nosebleeds.   Eyes:  Negative for pain and visual disturbance.  Respiratory:  Negative for shortness of breath.   Cardiovascular:  Negative for chest pain.  Gastrointestinal:  Negative for abdominal pain, nausea and vomiting.  Genitourinary:  Negative for flank pain.  Musculoskeletal:  Negative for back pain and neck pain.  Skin:  Negative for wound.  Neurological:  Negative for weakness and numbness.  Hematological:  Does not bruise/bleed easily.  Psychiatric/Behavioral:  Negative for agitation.    Physical Exam Updated Vital Signs BP (!) 121/50 (BP Location: Right Arm)  Pulse 92    Temp 98.4 F (36.9 C)    Resp 16    SpO2 95%   Physical Exam Vitals and nursing note reviewed.  Constitutional:      Appearance: Normal appearance. He is well-developed.  HENT:     Head:     Comments: Tenderness scalp. Large hemangioma to right head/face/neck area - pt indicates c/w baseline.     Nose: Nose normal.     Mouth/Throat:     Mouth: Mucous membranes are moist.     Pharynx: Oropharynx is clear.  Eyes:     General: No scleral icterus.    Extraocular Movements: Extraocular movements intact.     Conjunctiva/sclera: Conjunctivae normal.     Pupils: Pupils are equal, round, and reactive to light.  Neck:     Trachea: No tracheal deviation.  Cardiovascular:     Rate and Rhythm: Normal rate and regular rhythm.     Pulses: Normal pulses.     Heart sounds: Normal heart sounds. No murmur heard.   No friction rub. No gallop.  Pulmonary:     Effort: Pulmonary effort is normal. No accessory muscle usage or respiratory distress.     Breath sounds: Normal breath sounds.  Chest:     Chest wall: No tenderness.  Abdominal:     General: Bowel sounds are normal. There is no distension.     Palpations: Abdomen is soft.     Tenderness: There is no abdominal  tenderness.  Genitourinary:    Comments: No cva tenderness. Musculoskeletal:        General: No swelling.     Cervical back: Normal range of motion and neck supple. No rigidity.     Comments: CTLS spine, non tender, aligned, no step off. Good rom bilateral extremities without pain or focal bony tenderness.  Skin:    General: Skin is warm and dry.     Findings: No rash.  Neurological:     Mental Status: He is alert.     Comments: Alert, speech clear. GCS 15. Motor/sens grossly intact bil.   Psychiatric:        Mood and Affect: Mood normal.    ED Results / Procedures / Treatments   Labs (all labs ordered are listed, but only abnormal results are displayed) Labs Reviewed - No data to display  EKG None  Radiology CT Head Wo Contrast  Result Date: 09/19/2021 CLINICAL DATA:  Head trauma, fall EXAM: CT HEAD WITHOUT CONTRAST TECHNIQUE: Contiguous axial images were obtained from the base of the skull through the vertex without intravenous contrast. COMPARISON:  01/03/2007 FINDINGS: Brain: Redemonstrated extensive right hemisphere encephalomalacia, with diffuse calcifications, largely dural and gyral, and unchanged compared to the prior exam. The left hemisphere is without evidence of acute infarct, hemorrhage, mass, or mass effect. Unchanged size and configuration of the ventricles. No new extra-axial collection. Vascular: No hyperdense vessel. Skull: Prior right frontoparietal craniotomy. Hyperostosis frontalis. Sinuses/Orbits: No acute finding in the sinuses. Redemonstrated right exophthalmos, which appears slightly increased compared to 2008. No retro-orbital mass. Status post bilateral lens replacements. Other: The mastoids are well aerated. IMPRESSION: 1. 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 2. Right exophthalmos, which is somewhat  increased compared to the prior exam, without evidence of retro-orbital mass. 3. No acute intracranial process. Electronically Signed   By: Merilyn Baba M.D.   On: 09/19/2021 16:29    Procedures Procedures  Medications Ordered in ED Medications - No data to display  ED Course  I have reviewed the triage vital signs and the nursing notes.  Pertinent labs & imaging results that were available during my care of the patient were reviewed by me and considered in my medical decision making (see chart for details).    MDM Rules/Calculators/A&P                         Imaging ordered.   Reviewed nursing notes and prior charts for additional history.   CT reviewed/interpreted by me - no hem.  Pt comfortable appearing, no acute pain or distress.  Pt currently appears stable for d/c.   Rec pcp f/u.     Final Clinical Impression(s) / ED Diagnoses Final diagnoses:  None    Rx / DC Orders ED Discharge Orders     None        Lajean Saver, MD 09/19/21 1730

## 2021-09-21 ENCOUNTER — Ambulatory Visit (INDEPENDENT_AMBULATORY_CARE_PROVIDER_SITE_OTHER): Payer: Medicare Other | Admitting: Podiatry

## 2021-09-21 ENCOUNTER — Other Ambulatory Visit: Payer: Self-pay

## 2021-09-21 DIAGNOSIS — M79675 Pain in left toe(s): Secondary | ICD-10-CM | POA: Diagnosis not present

## 2021-09-21 DIAGNOSIS — M79674 Pain in right toe(s): Secondary | ICD-10-CM

## 2021-09-21 DIAGNOSIS — B351 Tinea unguium: Secondary | ICD-10-CM

## 2021-09-21 DIAGNOSIS — L03116 Cellulitis of left lower limb: Secondary | ICD-10-CM | POA: Diagnosis not present

## 2021-09-21 MED ORDER — CEPHALEXIN 500 MG PO CAPS: 500.0000 mg | ORAL_CAPSULE | Freq: Four times a day (QID) | ORAL | 0 refills | Status: DC

## 2021-09-21 NOTE — Patient Instructions (Signed)
Lab work ordered and attached. Please complete at [facility] by next visit   Keflex 500mg  four times daily by mouth. Rx sent to [facility] in [place]  Return in 2 week s

## 2021-09-22 LAB — CBC WITH DIFFERENTIAL/PLATELET
Basophils Absolute: 0.1 10*3/uL (ref 0.0–0.2)
Basos: 1 %
EOS (ABSOLUTE): 0.2 10*3/uL (ref 0.0–0.4)
Eos: 2 %
Hematocrit: 38.3 % (ref 37.5–51.0)
Hemoglobin: 11.4 g/dL — ABNORMAL LOW (ref 13.0–17.7)
Immature Grans (Abs): 0 10*3/uL (ref 0.0–0.1)
Immature Granulocytes: 0 %
Lymphocytes Absolute: 1.3 10*3/uL (ref 0.7–3.1)
Lymphs: 13 %
MCH: 22.8 pg — ABNORMAL LOW (ref 26.6–33.0)
MCHC: 29.8 g/dL — ABNORMAL LOW (ref 31.5–35.7)
MCV: 77 fL — ABNORMAL LOW (ref 79–97)
Monocytes Absolute: 0.8 10*3/uL (ref 0.1–0.9)
Monocytes: 8 %
Neutrophils Absolute: 7.6 10*3/uL — ABNORMAL HIGH (ref 1.4–7.0)
Neutrophils: 76 %
Platelets: 208 10*3/uL (ref 150–450)
RBC: 5 x10E6/uL (ref 4.14–5.80)
RDW: 16.4 % — ABNORMAL HIGH (ref 11.6–15.4)
WBC: 10.1 10*3/uL (ref 3.4–10.8)

## 2021-09-22 LAB — SEDIMENTATION RATE: Sed Rate: 29 mm/hr (ref 0–30)

## 2021-09-22 LAB — C-REACTIVE PROTEIN: CRP: 1 mg/L (ref 0–10)

## 2021-09-25 ENCOUNTER — Encounter: Payer: Self-pay | Admitting: Podiatry

## 2021-09-25 NOTE — Progress Notes (Signed)
Subjective:  Patient ID: Xavier Turner, male    DOB: 06/05/1956,  MRN: [id number]  Chief Complaint  Patient presents with   Nail Problem    diabetic foot care    66 y.o. male returns with the above complaint. History confirmed with patient.  Area on the bottom of his foot still very painful and started becoming red and swollen about a week ago.  Objective:  Physical Exam: warm, good capillary refill, no trophic changes or ulcerative lesions, normal DP and PT pulses, and abnormal sensory exam.  He has severe pes cavus foot deformity.  Prominent area of tenderness over the fifth metatarsal base plantarly.  He has edema and pain with peeling skin around the area as well as erythema of the midfoot..  Thickened elongated nails with yellow discoloration and subungual debris x10 Assessment:   1. Cellulitis of left foot   2. Pain due to onychomycosis of toenails of both feet       Plan:  Patient was evaluated and treated and all questions answered.  Appears to have cellulitis secondary to superficial prominent area of preulcerative callus.  I placed him on Keflex 500 mg 4 times daily and ordered lab work on him including CRP ESR and CBC.  I will follow-up with him in 2 weeks for review of this.  Discussed the etiology and treatment options for the condition in detail with the patient. Educated patient on the topical and oral treatment options for mycotic nails. Recommended debridement of the nails today. Sharp and mechanical debridement performed of all painful and mycotic nails today. Nails debrided in length and thickness using a nail nipper to level of comfort. Discussed treatment options including appropriate shoe gear. Follow up as needed for painful nails.    Return in about 2 weeks (around 10/05/2021) for recheck cellulitis , after lab work to review.

## 2021-10-05 ENCOUNTER — Ambulatory Visit (INDEPENDENT_AMBULATORY_CARE_PROVIDER_SITE_OTHER): Payer: Medicare Other | Admitting: Podiatry

## 2021-10-05 ENCOUNTER — Other Ambulatory Visit: Payer: Self-pay

## 2021-10-05 DIAGNOSIS — R2242 Localized swelling, mass and lump, left lower limb: Secondary | ICD-10-CM | POA: Diagnosis not present

## 2021-10-05 DIAGNOSIS — L03116 Cellulitis of left lower limb: Secondary | ICD-10-CM | POA: Diagnosis not present

## 2021-10-05 MED ORDER — CEPHALEXIN 500 MG PO CAPS: 500.0000 mg | ORAL_CAPSULE | Freq: Four times a day (QID) | ORAL | 0 refills | Status: AC

## 2021-10-06 ENCOUNTER — Ambulatory Visit (HOSPITAL_COMMUNITY)
Admission: RE | Admit: 2021-10-06 | Discharge: 2021-10-06 | Disposition: A | Discharge status: Home / Self Care | Payer: Medicare Other | Source: Ambulatory Visit | Attending: Podiatry | Admitting: Podiatry

## 2021-10-06 ENCOUNTER — Telehealth: Payer: Self-pay | Admitting: *Deleted

## 2021-10-06 DIAGNOSIS — R2242 Localized swelling, mass and lump, left lower limb: Secondary | ICD-10-CM | POA: Insufficient documentation

## 2021-10-06 NOTE — Telephone Encounter (Signed)
Called to give results for patient, is negative for left leg DVT, sending patient home.

## 2021-10-08 ENCOUNTER — Encounter: Payer: Self-pay | Admitting: Podiatry

## 2021-10-08 NOTE — Progress Notes (Signed)
°  Subjective:  Patient ID: Xavier Turner, male    DOB: Aug 22, 1956,  MRN: [id number]  Chief Complaint  Patient presents with   Cellulitis    Left foot cellulitis  Pt stated that his leg is sore and feels tight     65 y.o. male returns with the above complaint. History confirmed with patient.  Area on the bottom of his foot still very painful and started becoming red and swollen about a week ago.  He had some improvement since being on the antibiotic besides quite a bit of swelling and pain in his leg  Objective:  Physical Exam: warm, good capillary refill, no trophic changes or ulcerative lesions, normal DP and PT pulses, and abnormal sensory exam.  He has severe pes cavus foot deformity.  Prominent area of tenderness over the fifth metatarsal base plantarly.  He has edema and pain with peeling skin around the area as well as erythema of the midfoot..  Thickened elongated nails with yellow discoloration and subungual debris x10 Assessment:   1. Cellulitis of left foot   2. Localized swelling of left lower leg       Plan:  Patient was evaluated and treated and all questions answered.  Overall is had some improvement but not quite enough that I would like to see.  I am ordering a ultrasound DVT study to ensure this not a DVT, and MRI of the ankle to evaluate for possible abscess in the foot extending up into the ankle as well as renewing his Keflex prescription for him.  I will see him back in 2 weeks for review of the above   Return in about 2 weeks (around 10/19/2021) for recheck cellulitis .

## 2021-10-09 NOTE — Telephone Encounter (Signed)
Results  in epic to view

## 2021-10-19 ENCOUNTER — Other Ambulatory Visit: Payer: Self-pay

## 2021-10-19 ENCOUNTER — Ambulatory Visit
Admission: RE | Admit: 2021-10-19 | Discharge: 2021-10-19 | Disposition: A | Discharge status: Home / Self Care | Payer: Medicare Other | Source: Ambulatory Visit | Attending: Podiatry | Admitting: Podiatry

## 2021-10-19 DIAGNOSIS — L03116 Cellulitis of left lower limb: Secondary | ICD-10-CM

## 2021-11-02 ENCOUNTER — Telehealth: Payer: Self-pay | Admitting: *Deleted

## 2021-11-02 MED ORDER — UREA 40 % EX CREA: 1.0000 "application " | TOPICAL_CREAM | Freq: Every day | CUTANEOUS | 2 refills | Status: DC

## 2021-11-02 NOTE — Telephone Encounter (Signed)
Patient is calling for a refill of urea cream, please send to pharmacy on file.

## 2021-11-07 ENCOUNTER — Ambulatory Visit: Payer: Medicare Other | Admitting: Podiatry

## 2021-11-20 ENCOUNTER — Ambulatory Visit (INDEPENDENT_AMBULATORY_CARE_PROVIDER_SITE_OTHER): Payer: Medicare Other | Admitting: Podiatry

## 2021-11-20 ENCOUNTER — Other Ambulatory Visit: Payer: Self-pay

## 2021-11-20 ENCOUNTER — Other Ambulatory Visit: Payer: Self-pay | Admitting: Podiatry

## 2021-11-20 DIAGNOSIS — M10472 Other secondary gout, left ankle and foot: Secondary | ICD-10-CM

## 2021-11-20 DIAGNOSIS — L03116 Cellulitis of left lower limb: Secondary | ICD-10-CM | POA: Diagnosis not present

## 2021-11-20 MED ORDER — CEPHALEXIN 500 MG PO CAPS: 500.0000 mg | ORAL_CAPSULE | Freq: Four times a day (QID) | ORAL | 0 refills | Status: AC

## 2021-11-21 LAB — BASIC METABOLIC PANEL (7)
BUN/Creatinine Ratio: 47 — ABNORMAL HIGH (ref 10–24)
BUN: 20 mg/dL (ref 8–27)
CO2: 27 mmol/L (ref 20–29)
Chloride: 101 mmol/L (ref 96–106)
Creatinine, Ser: 0.43 mg/dL — ABNORMAL LOW (ref 0.76–1.27)
Glucose: 109 mg/dL — ABNORMAL HIGH (ref 70–99)
Potassium: 4.2 mmol/L (ref 3.5–5.2)
Sodium: 141 mmol/L (ref 134–144)
eGFR: 118 mL/min/{1.73_m2} (ref >59)

## 2021-11-21 LAB — URIC ACID: Uric Acid: 2.5 mg/dL — ABNORMAL LOW (ref 3.8–8.4)

## 2021-11-21 NOTE — Progress Notes (Signed)
°  Subjective:  Patient ID: Xavier Turner, male    DOB: 07-Dec-1955,  MRN: [id number]  Chief Complaint  Patient presents with   cellulitis of foot     2 week f/u for recheck cellulitis left foot/lower leg    66 y.o. male returns with the above complaint. History confirmed with patient.  Foot and ankle continues to be quite swollen and painful and red for him  Objective:  Physical Exam: warm, good capillary refill, no trophic changes or ulcerative lesions, normal DP and PT pulses, and abnormal sensory exam.  He has severe pes cavus foot deformity.  Prominent area of tenderness over the fifth metatarsal base plantarly.  He has edema and pain with peeling skin around the area as well as erythema of the midfoot this extends into the ankle and leg, has not improved much.  Thickened elongated nails with yellow discoloration and subungual debris x10  Summary:  RIGHT:  - No evidence of common femoral vein obstruction.     LEFT:  - There is no evidence of deep vein thrombosis in the lower extremity.  - There is no evidence of superficial venous thrombosis.     - No cystic structure found in the popliteal fossa.      *See table(s) above for measurements and observations.   Electronically signed by Orlie Pollen on 10/06/2021 at 4:20:40 PM.  Assessment:   1. Cellulitis of left foot   2. Other secondary acute gout of left foot       Plan:  Patient was evaluated and treated and all questions answered.  Still has had not any improvement.  I renewed his Keflex he said he did improve somewhat while he was on the Keflex but it got worse after he came off of it.  I am also ordering lab work to evaluate for the possibility of gout causing this.  His DVT ultrasound was negative which I reviewed with him.  I will see him back in 3 weeks for reevaluation   Return in about 3 weeks (around 12/11/2021) for recheck foot / lab work .

## 2021-12-12 ENCOUNTER — Ambulatory Visit (INDEPENDENT_AMBULATORY_CARE_PROVIDER_SITE_OTHER): Payer: Medicare Other | Admitting: Podiatry

## 2021-12-12 ENCOUNTER — Other Ambulatory Visit: Payer: Self-pay

## 2021-12-12 DIAGNOSIS — M7752 Other enthesopathy of left foot: Secondary | ICD-10-CM

## 2021-12-14 ENCOUNTER — Encounter (HOSPITAL_COMMUNITY): Payer: Self-pay | Admitting: Pharmacy Technician

## 2021-12-14 ENCOUNTER — Emergency Department (HOSPITAL_COMMUNITY): Payer: Medicare Other

## 2021-12-14 ENCOUNTER — Inpatient Hospital Stay (HOSPITAL_COMMUNITY)
Admission: EM | Admit: 2021-12-14 | Discharge: 2021-12-18 | DRG: 638 | Disposition: A | Discharge status: Skilled Nursing Facility | Payer: Medicare Other | Attending: Internal Medicine | Admitting: Internal Medicine

## 2021-12-14 ENCOUNTER — Other Ambulatory Visit: Payer: Self-pay

## 2021-12-14 DIAGNOSIS — G40909 Epilepsy, unspecified, not intractable, without status epilepticus: Secondary | ICD-10-CM | POA: Diagnosis present

## 2021-12-14 DIAGNOSIS — F32A Depression, unspecified: Secondary | ICD-10-CM | POA: Diagnosis present

## 2021-12-14 DIAGNOSIS — Z89422 Acquired absence of other left toe(s): Secondary | ICD-10-CM

## 2021-12-14 DIAGNOSIS — Z79899 Other long term (current) drug therapy: Secondary | ICD-10-CM

## 2021-12-14 DIAGNOSIS — E7800 Pure hypercholesterolemia, unspecified: Secondary | ICD-10-CM | POA: Diagnosis present

## 2021-12-14 DIAGNOSIS — E119 Type 2 diabetes mellitus without complications: Secondary | ICD-10-CM | POA: Diagnosis present

## 2021-12-14 DIAGNOSIS — E11628 Type 2 diabetes mellitus with other skin complications: Principal | ICD-10-CM | POA: Diagnosis present

## 2021-12-14 DIAGNOSIS — E1169 Type 2 diabetes mellitus with other specified complication: Secondary | ICD-10-CM | POA: Diagnosis present

## 2021-12-14 DIAGNOSIS — L03116 Cellulitis of left lower limb: Secondary | ICD-10-CM | POA: Diagnosis not present

## 2021-12-14 DIAGNOSIS — E8809 Other disorders of plasma-protein metabolism, not elsewhere classified: Secondary | ICD-10-CM | POA: Diagnosis present

## 2021-12-14 DIAGNOSIS — Q8589 Other phakomatoses, not elsewhere classified: Secondary | ICD-10-CM

## 2021-12-14 DIAGNOSIS — Z7982 Long term (current) use of aspirin: Secondary | ICD-10-CM

## 2021-12-14 DIAGNOSIS — Z7984 Long term (current) use of oral hypoglycemic drugs: Secondary | ICD-10-CM

## 2021-12-14 DIAGNOSIS — Z85820 Personal history of malignant melanoma of skin: Secondary | ICD-10-CM

## 2021-12-14 DIAGNOSIS — N400 Enlarged prostate without lower urinary tract symptoms: Secondary | ICD-10-CM | POA: Diagnosis present

## 2021-12-14 DIAGNOSIS — M79605 Pain in left leg: Principal | ICD-10-CM

## 2021-12-14 DIAGNOSIS — D509 Iron deficiency anemia, unspecified: Secondary | ICD-10-CM | POA: Diagnosis present

## 2021-12-14 DIAGNOSIS — I152 Hypertension secondary to endocrine disorders: Secondary | ICD-10-CM | POA: Diagnosis present

## 2021-12-14 DIAGNOSIS — R944 Abnormal results of kidney function studies: Secondary | ICD-10-CM | POA: Diagnosis present

## 2021-12-14 DIAGNOSIS — E1159 Type 2 diabetes mellitus with other circulatory complications: Secondary | ICD-10-CM | POA: Diagnosis present

## 2021-12-14 DIAGNOSIS — G4733 Obstructive sleep apnea (adult) (pediatric): Secondary | ICD-10-CM | POA: Diagnosis present

## 2021-12-14 DIAGNOSIS — Z791 Long term (current) use of non-steroidal anti-inflammatories (NSAID): Secondary | ICD-10-CM

## 2021-12-14 LAB — COMPREHENSIVE METABOLIC PANEL
ALT: 44 U/L (ref 0–44)
AST: 36 U/L (ref 15–41)
Albumin: 3.3 g/dL — ABNORMAL LOW (ref 3.5–5.0)
Alkaline Phosphatase: 71 U/L (ref 38–126)
Anion gap: 8 (ref 5–15)
BUN: 25 mg/dL — ABNORMAL HIGH (ref 8–23)
CO2: 28 mmol/L (ref 22–32)
Calcium: 8.6 mg/dL — ABNORMAL LOW (ref 8.9–10.3)
Chloride: 105 mmol/L (ref 98–111)
Creatinine, Ser: 0.45 mg/dL — ABNORMAL LOW (ref 0.61–1.24)
GFR, Estimated: >60 mL/min (ref >60)
Glucose, Bld: 123 mg/dL — ABNORMAL HIGH (ref 70–99)
Potassium: 3.8 mmol/L (ref 3.5–5.1)
Sodium: 141 mmol/L (ref 135–145)
Total Bilirubin: 0.8 mg/dL (ref 0.3–1.2)
Total Protein: 6.1 g/dL — ABNORMAL LOW (ref 6.5–8.1)

## 2021-12-14 LAB — CBC WITH DIFFERENTIAL/PLATELET
Abs Immature Granulocytes: 0.03 10*3/uL (ref 0.00–0.07)
Basophils Absolute: 0 10*3/uL (ref 0.0–0.1)
Basophils Relative: 0 %
Eosinophils Absolute: 0.2 10*3/uL (ref 0.0–0.5)
Eosinophils Relative: 2 %
HCT: 39.2 % (ref 39.0–52.0)
Hemoglobin: 11.5 g/dL — ABNORMAL LOW (ref 13.0–17.0)
Immature Granulocytes: 0 %
Lymphocytes Relative: 21 %
Lymphs Abs: 1.7 10*3/uL (ref 0.7–4.0)
MCH: 23.2 pg — ABNORMAL LOW (ref 26.0–34.0)
MCHC: 29.3 g/dL — ABNORMAL LOW (ref 30.0–36.0)
MCV: 79 fL — ABNORMAL LOW (ref 80.0–100.0)
Monocytes Absolute: 0.7 10*3/uL (ref 0.1–1.0)
Monocytes Relative: 8 %
Neutro Abs: 5.6 10*3/uL (ref 1.7–7.7)
Neutrophils Relative %: 69 %
Platelets: 198 10*3/uL (ref 150–400)
RBC: 4.96 MIL/uL (ref 4.22–5.81)
RDW: 18.8 % — ABNORMAL HIGH (ref 11.5–15.5)
WBC: 8.2 10*3/uL (ref 4.0–10.5)
nRBC: 0 % (ref 0.0–0.2)

## 2021-12-14 LAB — LACTIC ACID, PLASMA
Lactic Acid, Venous: 1.5 mmol/L (ref 0.5–1.9)
Lactic Acid, Venous: 2.3 mmol/L (ref 0.5–1.9)

## 2021-12-14 LAB — HIV ANTIBODY (ROUTINE TESTING W REFLEX): HIV Screen 4th Generation wRfx: NONREACTIVE

## 2021-12-14 MED ORDER — FUROSEMIDE 20 MG PO TABS
20.0000 mg | ORAL_TABLET | Freq: Every day | ORAL | Status: DC
  Administered 2021-12-15 – 2021-12-18 (×4): 20 mg via ORAL
  Filled 2021-12-14 (×6): qty 1

## 2021-12-14 MED ORDER — LACTATED RINGERS IV SOLN: INTRAVENOUS | Status: AC

## 2021-12-14 MED ORDER — DORZOLAMIDE HCL-TIMOLOL MAL 2-0.5 % OP SOLN
1.0000 [drp] | Freq: Two times a day (BID) | OPHTHALMIC | Status: DC
  Administered 2021-12-14 – 2021-12-18 (×8): 1 [drp] via OPHTHALMIC
  Filled 2021-12-14: qty 10

## 2021-12-14 MED ORDER — PANTOPRAZOLE SODIUM 40 MG PO TBEC
40.0000 mg | DELAYED_RELEASE_TABLET | Freq: Every day | ORAL | Status: DC
  Administered 2021-12-15 – 2021-12-18 (×4): 40 mg via ORAL
  Filled 2021-12-14 (×4): qty 1

## 2021-12-14 MED ORDER — BISACODYL 5 MG PO TBEC: 5.0000 mg | DELAYED_RELEASE_TABLET | Freq: Every day | ORAL | Status: DC | PRN

## 2021-12-14 MED ORDER — TAMSULOSIN HCL 0.4 MG PO CAPS
0.4000 mg | ORAL_CAPSULE | Freq: Every day | ORAL | Status: DC
  Administered 2021-12-15 – 2021-12-17 (×3): 0.4 mg via ORAL
  Filled 2021-12-14 (×3): qty 1

## 2021-12-14 MED ORDER — CEFAZOLIN SODIUM-DEXTROSE 1-4 GM/50ML-% IV SOLN
1.0000 g | Freq: Three times a day (TID) | INTRAVENOUS | Status: DC
  Administered 2021-12-14 – 2021-12-15 (×2): 1 g via INTRAVENOUS
  Filled 2021-12-14 (×2): qty 50

## 2021-12-14 MED ORDER — BRIMONIDINE TARTRATE 0.2 % OP SOLN
1.0000 [drp] | Freq: Three times a day (TID) | OPHTHALMIC | Status: DC
  Administered 2021-12-14 – 2021-12-18 (×11): 1 [drp] via OPHTHALMIC
  Filled 2021-12-14: qty 5

## 2021-12-14 MED ORDER — MELOXICAM 7.5 MG PO TABS
15.0000 mg | ORAL_TABLET | Freq: Every day | ORAL | Status: DC
  Administered 2021-12-15 – 2021-12-18 (×4): 15 mg via ORAL
  Filled 2021-12-14 (×4): qty 2

## 2021-12-14 MED ORDER — LACTATED RINGERS IV SOLN: INTRAVENOUS | Status: DC

## 2021-12-14 MED ORDER — LINAGLIPTIN 5 MG PO TABS
5.0000 mg | ORAL_TABLET | Freq: Every day | ORAL | Status: DC
  Administered 2021-12-15 – 2021-12-18 (×4): 5 mg via ORAL
  Filled 2021-12-14 (×4): qty 1

## 2021-12-14 MED ORDER — ASPIRIN EC 81 MG PO TBEC
81.0000 mg | DELAYED_RELEASE_TABLET | Freq: Every day | ORAL | Status: DC
  Administered 2021-12-14 – 2021-12-18 (×5): 81 mg via ORAL
  Filled 2021-12-14 (×5): qty 1

## 2021-12-14 MED ORDER — GABAPENTIN 100 MG PO CAPS
100.0000 mg | ORAL_CAPSULE | Freq: Two times a day (BID) | ORAL | Status: DC
  Administered 2021-12-14 – 2021-12-18 (×8): 100 mg via ORAL
  Filled 2021-12-14 (×8): qty 1

## 2021-12-14 MED ORDER — ROSUVASTATIN CALCIUM 20 MG PO TABS
20.0000 mg | ORAL_TABLET | Freq: Every day | ORAL | Status: DC
  Administered 2021-12-15 – 2021-12-18 (×4): 20 mg via ORAL
  Filled 2021-12-14 (×5): qty 1

## 2021-12-14 MED ORDER — OXYCODONE HCL 5 MG PO TABS: 5.0000 mg | ORAL_TABLET | ORAL | Status: DC | PRN

## 2021-12-14 MED ORDER — ESCITALOPRAM OXALATE 10 MG PO TABS
10.0000 mg | ORAL_TABLET | Freq: Every day | ORAL | Status: DC
  Administered 2021-12-15 – 2021-12-18 (×4): 10 mg via ORAL
  Filled 2021-12-14 (×4): qty 1

## 2021-12-14 MED ORDER — ACETAMINOPHEN 650 MG RE SUPP: 650.0000 mg | Freq: Four times a day (QID) | RECTAL | Status: DC | PRN

## 2021-12-14 MED ORDER — ACETAMINOPHEN 325 MG PO TABS
650.0000 mg | ORAL_TABLET | Freq: Four times a day (QID) | ORAL | Status: DC | PRN
  Administered 2021-12-16: 650 mg via ORAL
  Filled 2021-12-14: qty 2

## 2021-12-14 MED ORDER — METFORMIN HCL 500 MG PO TABS
1000.0000 mg | ORAL_TABLET | Freq: Two times a day (BID) | ORAL | Status: DC
  Administered 2021-12-15 – 2021-12-18 (×7): 1000 mg via ORAL
  Filled 2021-12-14 (×7): qty 2

## 2021-12-14 MED ORDER — VITAMIN D 25 MCG (1000 UNIT) PO TABS
2000.0000 [IU] | ORAL_TABLET | Freq: Every day | ORAL | Status: DC
  Administered 2021-12-15 – 2021-12-18 (×4): 2000 [IU] via ORAL
  Filled 2021-12-14 (×4): qty 2

## 2021-12-14 MED ORDER — HYDROXYZINE HCL 10 MG PO TABS
5.0000 mg | ORAL_TABLET | Freq: Every evening | ORAL | Status: DC | PRN
  Filled 2021-12-14: qty 1

## 2021-12-14 MED ORDER — POLYETHYLENE GLYCOL 3350 17 G PO PACK: 17.0000 g | PACK | Freq: Every day | ORAL | Status: DC | PRN

## 2021-12-14 MED ORDER — ENOXAPARIN SODIUM 40 MG/0.4ML IJ SOSY
40.0000 mg | PREFILLED_SYRINGE | INTRAMUSCULAR | Status: DC
  Administered 2021-12-14 – 2021-12-17 (×4): 40 mg via SUBCUTANEOUS
  Filled 2021-12-14 (×4): qty 0.4

## 2021-12-14 MED ORDER — FERROUS SULFATE 325 (65 FE) MG PO TABS
325.0000 mg | ORAL_TABLET | Freq: Every day | ORAL | Status: DC
  Administered 2021-12-15 – 2021-12-18 (×4): 325 mg via ORAL
  Filled 2021-12-14 (×4): qty 1

## 2021-12-14 MED ORDER — EMPAGLIFLOZIN 10 MG PO TABS
10.0000 mg | ORAL_TABLET | Freq: Every day | ORAL | Status: DC
  Administered 2021-12-15 – 2021-12-18 (×4): 10 mg via ORAL
  Filled 2021-12-14 (×4): qty 1

## 2021-12-14 NOTE — ED Provider Notes (Signed)
Admit when labs return ?Physical Exam  ?BP 123/66 (BP Location: Right Arm)   Pulse 79   Temp 98.2 ?F (36.8 ?C) (Oral)   Resp 18   SpO2 94%  ? ?Physical Exam ? ?Procedures  ?Procedures ? ?ED Course / MDM  ?  ?Medical Decision Making ?Amount and/or Complexity of Data Reviewed ?Labs: ordered. ?Radiology: ordered. ? ?Risk ?Decision regarding hospitalization. ? ? ? ?Labs without significant abnormality.  Reviewed by myself. ? ?Consult: Internal medicine teaching service for admission Dr.A[place]onsah accepts. ? ? ? ?  ?Charlesetta Shanks, MD ?12/14/21 1638 ? ?

## 2021-12-14 NOTE — ED Triage Notes (Signed)
Pt here with L foot pain for several years, worsening over the last few weeks. Pt also needing assessment for SNF placement.  ?

## 2021-12-14 NOTE — Evaluation (Signed)
Physical Therapy Evaluation ?Patient Details ?Name: Xavier Turner ?MRN: [id number] ?DOB: 18-Jun-1956 ?Today's Date: 12/14/2021 ? ?History of Present Illness ? Pt presented to ED on 3/23 from group home with worsening left foot pain. 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  ?Clinical Impression ? Pt presents with a recent decline in ability to safely mobilize at group home due to lt foot pain. Per group home staff member he is too high of a fall risk to return to the home without rehab. Expect pt can progress back to a higher level of independence.    ?   ? ?Recommendations for follow up therapy are one component of a multi-disciplinary discharge planning process, led by the attending physician.  Recommendations may be updated based on patient status, additional functional criteria and insurance authorization. ? ?Follow Up Recommendations Skilled nursing-short term rehab (<3 hours/day) ? ?  ?Assistance Recommended at Discharge Frequent or constant Supervision/Assistance  ?Patient can return home with the following ? A little help with walking and/or transfers;A little help with bathing/dressing/bathroom ? ?  ?Equipment Recommendations None recommended by PT  ?Recommendations for Other Services ?    ?  ?Functional Status Assessment Patient has had a recent decline in their functional status and/or demonstrates limited ability to make significant improvements in function in a reasonable and predictable amount of time  ? ?  ?Precautions / Restrictions Precautions ?Precautions: Fall ?Required Braces or Orthoses: Other Brace ?Other Brace: Pt using lt foot boot/splint for amb ?Restrictions ?Weight Bearing Restrictions: No  ? ?  ? ?Mobility ? Bed Mobility ?Overal bed mobility: Needs Assistance ?Bed Mobility: Supine to Sit, Sit to Supine ?  ?  ?Supine to sit: Min assist ?Sit to supine: Min assist ?  ?General bed mobility comments: Assist  to elevate trunk into sitting and assist to bring left leg back up onto stretcher returning to supine ?  ? ?Transfers ?Overall transfer level: Needs assistance ?Equipment used: Straight cane ?Transfers: Sit to/from Stand, Bed to chair/wheelchair/BSC ?Sit to Stand: Min assist ?  ?Step pivot transfers: Min assist ?  ?  ?  ?General transfer comment: Assist to bring hips up and for balance. Stretcher to w/c to stretcher with cane and min assist for balance. ?  ? ?Ambulation/Gait ?  ?  ?  ?  ?  ?  ?  ?General Gait Details: at baseline uses w/c due to lt foot pain and fall risk ? ?Stairs ?  ?  ?  ?  ?  ? ?Wheelchair Mobility ?  ? ?Modified Rankin (Stroke Patients Only) ?  ? ?  ? ?Balance Overall balance assessment: Needs assistance ?Sitting-balance support: No upper extremity supported, Feet unsupported ?Sitting balance-Leahy Scale: Fair ?  ?  ?Standing balance support: Single extremity supported ?Standing balance-Leahy Scale: Poor ?Standing balance comment: cane and min guard for static standing ?  ?  ?  ?  ?  ?  ?  ?  ?  ?  ?  ?   ? ? ? ?Pertinent Vitals/Pain Pain Assessment ?Pain Assessment: Faces ?Faces Pain Scale: Hurts little more ?Pain Location: lt foot ?Pain Descriptors / Indicators: Grimacing, Guarding ?Pain Intervention(s): Limited activity within patient's tolerance  ? ? ?Home Living Family/patient expects to be discharged to:: Group home ?  ?  ?  ?  ?  ?  ?  ?  ?  ?   ?  ?Prior Function Prior Level of Function : Needs  assist ?  ?  ?  ?  ?  ?  ?Mobility Comments: Uses cane to transfer into his w/c and he uses his w/c to mobilize in the group home. Recently limited by lt foot pain ?  ?  ? ? ?Hand Dominance  ? Dominant Hand: Right ? ?  ?Extremity/Trunk Assessment  ? 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  ? ?Lower Extremity Assessment ?Lower Extremity Assessment: Generalized weakness;LLE  deficits/detail ?LLE Deficits / Details: LLE limited by pain and chronic weakness ?  ? ?   ?Communication  ? Communication: No difficulties  ?Cognition Arousal/Alertness: Awake/alert ?Behavior During Therapy: [facility] for tasks assessed/performed ?Overall Cognitive Status: History of cognitive impairments - at baseline ?  ?  ?  ?  ?  ?  ?  ?  ?  ?  ?  ?  ?  ?  ?  ?  ?  ?  ?  ? ?  ?General Comments   ? ?  ?Exercises    ? ?Assessment/Plan  ?  ?PT Assessment Patient needs continued PT services  ?PT Problem List Decreased strength;Decreased balance;Decreased mobility;Pain ? ?   ?  ?PT Treatment Interventions DME instruction;Gait training;Functional mobility training;Therapeutic activities;Therapeutic exercise;Balance training;Patient/family education   ? ?PT Goals (Current goals can be found in the Care Plan section)  ?Acute Rehab PT Goals ?Patient Stated Goal: get foot better ?PT Goal Formulation: With patient ?Time For Goal Achievement: 12/28/21 ?Potential to Achieve Goals: Good ? ?  ?Frequency Min 2X/week ?  ? ? ?Co-evaluation   ?  ?  ?  ?  ? ? ?  ?AM-PAC PT "6 Clicks" Mobility  ?Outcome Measure Help needed turning from your back to your side while in a flat bed without using bedrails?: A Little ?Help needed moving from lying on your back to sitting on the side of a flat bed without using bedrails?: A Little ?Help needed moving to and from a bed to a chair (including a wheelchair)?: A Little ?Help needed standing up from a chair using your arms (e.g., wheelchair or bedside chair)?: A Little ?Help needed to walk in hospital room?: Total ?Help needed climbing 3-5 steps with a railing? : Total ?6 Click Score: 14 ? ?  ?End of Session   ?Activity Tolerance: Patient tolerated treatment well ?Patient left: in bed;with family/visitor present (caregivers present) ?  ?PT Visit Diagnosis: Other abnormalities of gait and mobility (R26.89);Pain ?Pain - Right/Left: Left ?Pain - part of body: Ankle and joints of foot ?  ? ?Time:  9622-2979 ?PT Time Calculation (min) (ACUTE ONLY): 13 min ? ? ?Charges:   PT Evaluation ?$PT Eval Low Complexity: 1 Low ?  ?  ?   ? ? ?[facility] PT ?Acute Rehabilitation Services ?Pager [phone] ?Office [phone] ? ? ?Shary Decamp [facility] ?12/14/2021, 4:56 PM ?

## 2021-12-14 NOTE — Hospital Course (Addendum)
Non-purulent LLE cellulitis without SIRS criteria ?Hx bilateral LE edema ?Patient presented with prolonged history of L foot pain that had worsened to the point of causing difficulty with ambulation over the previous few weeks. At presentation he was hemodynamically stable without leukocytosis or fever but did have bump in lactic acid from 1.5>2.3. Left foot x-ray showed moderate to high-grade soft tissue swelling of the forefoot, no definite acute fracture or cortical erosion seen to indicate radiographic evidence of acute osteomyelitis. Home lasix was continued and he received gentle hydration with IVF. He was started on cefazolin at admission and vancomycin was started on HD1. Blood cultures collected at admission showed no growth to date. Cefazolin was ultimately discontinued. MRI left foot was obtained but limited due to high-grade patient motion artifact; it did not show any signs of osteomyelitis or fluid collection. At time of discharge he remained hemodynamically stable without fever or leukocytosis and overall clinical improvement of the LLE and foot. ?  ?Type 2 diabetes mellitus ?Last HbA1c in 06/2021 of 6.4%. Outpatient regimen of tradjenta 5 mg daily, metformin 1000 mg BID, jardiance 10 mg daily. ?-Continue home tradjenta 5 mg daily, metformin 1000 mg BID, jardiance 10 mg daily. ?  ?Hyperlipidemia ?Chronic condition. Continued home rosuvastatin 20 mg daily. ?  ?History of iron deficiency anemia ?Admission labs reveal microcytic anemia which was comparable to baseline labs in Care Everywhere. He was hemodynamically stable without signs of acute bleed at this time. Home ferrous sulfate was continued. CBC was stable throughout admission. ? ?Hx BPH ?Chronic condition. Continued home tamsulosin. ? ?Hx depression ?Chronic condition. Continued home lexapro. ?

## 2021-12-14 NOTE — ED Provider Notes (Signed)
?[facility] ?Provider Note ? ? ?CSN: [id number] ?Arrival date & time: 12/14/21  1015 ? ?  ? ?History ? ?Chief Complaint  ?Patient presents with  ? Foot Pain  ? ? ?Xavier Turner is a 66 y.o. male. ? ?The history is provided by the patient, a caregiver and medical records. No language interpreter was used.  ?Foot Pain ?This is a recurrent problem. The current episode started more than 1 week ago. The problem occurs constantly. The problem has been gradually worsening. Pertinent negatives include no chest pain, no abdominal pain, no headaches and no shortness of breath. The symptoms are aggravated by standing and walking. Nothing relieves the symptoms. He has tried nothing (antibiotics) for the symptoms. The treatment provided no relief.  ? ?  ? ?Home Medications ?Prior to Admission medications   ?Medication Sig Start Date End Date Taking? Authorizing Provider  ?aspirin 81 MG chewable tablet Chew 81 mg by mouth daily.    [provider]  ?brimonidine (ALPHAGAN) 0.2 % ophthalmic solution Place 1 drop into both eyes 3 (three) times daily. 08/28/19   Hennie Duos, MD  ?carboxymethylcellulose (REFRESH PLUS) 0.5 % SOLN Place 1 drop into both eyes 4 (four) times daily as needed (dry eyes).    [provider]  ?cholecalciferol (VITAMIN D3) 25 MCG (1000 UT) tablet Take 1 tablet (1,000 Units total) by mouth daily. ?Patient taking differently: Take 2,000 Units by mouth daily. 08/28/19   Hennie Duos, MD  ?dorzolamide-timolol (COSOPT) 22.3-6.8 MG/ML ophthalmic solution Place 1 drop into the right eye 2 (two) times daily. ?Patient taking differently: Place 1 drop into both eyes 2 (two) times daily. 08/28/19   Hennie Duos, MD  ?escitalopram (LEXAPRO) 10 MG tablet Take 1 tablet (10 mg total) by mouth daily. 08/28/19   Hennie Duos, MD  ?furosemide (LASIX) 20 MG tablet Take 20 mg by mouth daily.    [provider]  ?gabapentin (NEURONTIN) 100 MG capsule  Take 1 capsule (100 mg total) by mouth 2 (two) times daily. 08/28/19   Hennie Duos, MD  ?hydrochlorothiazide (HYDRODIURIL) 25 MG tablet Take 1 tablet (25 mg total) by mouth daily. ?Patient not taking: No sig reported 08/28/19   Hennie Duos, MD  ?hydrOXYzine (ATARAX/VISTARIL) 10 MG tablet Take 5 mg by mouth at bedtime as needed for itching.    [provider]  ?JARDIANCE 10 MG TABS tablet Take 10 mg by mouth daily. 04/03/21   [provider]  ?meloxicam (MOBIC) 15 MG tablet Take 1 tablet (15 mg total) by mouth daily. 12/30/19   Edrick Kins, DPM  ?metFORMIN (GLUCOPHAGE) 1000 MG tablet Take 1 tablet (1,000 mg total) by mouth 2 (two) times daily with a meal. 08/28/19   Hennie Duos, MD  ?Misc. Devices MISC Please supply patient with shower chair with U-shaped seat. 10/07/19   [provider]  ?nystatin (MYCOSTATIN/NYSTOP) powder Apply 1 application topically 3 (three) times daily as needed (rash). Apply groin, underarms, and belly fold    [provider]  ?omeprazole (PRILOSEC) 20 MG capsule Take 1 capsule (20 mg total) by mouth daily. ?Patient taking differently: Take 40 mg by mouth daily. 08/28/19   Hennie Duos, MD  ?pioglitazone (ACTOS) 15 MG tablet Take 15 mg by mouth daily. 06/28/20   [provider]  ?RHOPRESSA 0.02 % SOLN Place 1 drop into both eyes every evening. 09/21/19   [provider]  ?ROCKLATAN 0.02-0.005 % SOLN Place  1 drop into both eyes at bedtime. ?Patient not taking: No sig reported 08/28/19   Hennie Duos, MD  ?rosuvastatin (CRESTOR) 20 MG tablet Take 1 tablet (20 mg total) by mouth daily. 08/28/19   Hennie Duos, MD  ?sitaGLIPtin (JANUVIA) 100 MG tablet Take 1 tablet (100 mg total) by mouth daily. 08/28/19   Hennie Duos, MD  ?Skin Protectants, Misc. (MINERIN CREME) CREA Apply 1 application topically daily as needed (dry skin).    [provider]  ?tamsulosin (FLOMAX) 0.4 MG CAPS capsule Take 1 capsule (0.4  mg total) by mouth daily. 08/28/19   Hennie Duos, MD  ?urea (CARMOL) 40 % CREA Apply 1 application topically daily. To calluses on feet 11/02/21   Criselda Peaches, DPM  ?   ? ?Allergies    ?Patient has no known allergies.   ? ?Review of Systems   ?Review of Systems  ?Constitutional:  Negative for chills, diaphoresis, fatigue and fever.  ?HENT:  Negative for congestion.   ?Eyes:  Negative for visual disturbance.  ?Respiratory:  Negative for cough, chest tightness and shortness of breath.   ?Cardiovascular:  Negative for chest pain.  ?Gastrointestinal:  Negative for abdominal pain, constipation, diarrhea, nausea and vomiting.  ?Genitourinary:  Negative for dysuria, flank pain and frequency.  ?Musculoskeletal:  Negative for back pain, neck pain and neck stiffness.  ?Skin:  Positive for rash.  ?Neurological:  Negative for light-headedness, numbness and headaches.  ?Psychiatric/Behavioral:  Negative for agitation and confusion.   ?All other systems reviewed and are negative. ? ?Physical Exam ?Updated Vital Signs ?BP 123/66 (BP Location: Right Arm)   Pulse 79   Temp 98.2 ?F (36.8 ?C) (Oral)   Resp 18   SpO2 94%  ?Physical Exam ?Vitals and nursing note reviewed.  ?Constitutional:   ?   General: He is not in acute distress. ?   Appearance: He is well-developed. He is not ill-appearing, toxic-appearing or diaphoretic.  ?HENT:  ?   Head: Normocephalic and atraumatic.  ?   Nose: Nose normal.  ?   Mouth/Throat:  ?   Mouth: Mucous membranes are moist.  ?Eyes:  ?   Extraocular Movements: Extraocular movements intact.  ?   Conjunctiva/sclera: Conjunctivae normal.  ?   Pupils: Pupils are equal, round, and reactive to light.  ?Cardiovascular:  ?   Rate and Rhythm: Normal rate and regular rhythm.  ?   Heart sounds: No murmur heard. ?Pulmonary:  ?   Effort: Pulmonary effort is normal. No respiratory distress.  ?   Breath sounds: Normal breath sounds. No wheezing, rhonchi or rales.  ?Chest:  ?   Chest wall: No tenderness.   ?Abdominal:  ?   General: Abdomen is flat.  ?   Palpations: Abdomen is soft.  ?   Tenderness: There is no abdominal tenderness. There is no right CVA tenderness, left CVA tenderness, guarding or rebound.  ?Musculoskeletal:     ?   General: Swelling and tenderness present.  ?   Cervical back: Neck supple. No tenderness.  ?Skin: ?   General: Skin is warm and dry.  ?   Capillary Refill: Capillary refill takes less than 2 seconds.  ?   Findings: Erythema present.  ?Neurological:  ?   General: No focal deficit present.  ?   Mental Status: He is alert.  ?   Motor: Weakness present.  ?Psychiatric:     ?   Mood and Affect: Mood normal.  ? ? ?ED Results /  Procedures / Treatments   ?Labs ?(all labs ordered are listed, but only abnormal results are displayed) ?Labs Reviewed  ?CBC WITH DIFFERENTIAL/PLATELET - Abnormal; Notable for the following components:  ?    Result Value  ? Hemoglobin 11.5 (*)   ? MCV 79.0 (*)   ? MCH 23.2 (*)   ? MCHC 29.3 (*)   ? RDW 18.8 (*)   ? All other components within normal limits  ?COMPREHENSIVE METABOLIC PANEL - Abnormal; Notable for the following components:  ? Glucose, Bld 123 (*)   ? BUN 25 (*)   ? Creatinine, Ser 0.45 (*)   ? Calcium 8.6 (*)   ? Total Protein 6.1 (*)   ? Albumin 3.3 (*)   ? All other components within normal limits  ?CULTURE, BLOOD (ROUTINE X 2)  ?CULTURE, BLOOD (ROUTINE X 2)  ?LACTIC ACID, PLASMA  ?CBC WITH DIFFERENTIAL/PLATELET  ?LACTIC ACID, PLASMA  ? ? ?EKG ?None ? ?Radiology ?DG Foot Complete Left ? ?Result Date: 12/14/2021 ?CLINICAL DATA:  Worsening foot pain.  Chronic. EXAM: LEFT FOOT - COMPLETE 3+ VIEW COMPARISON:  Left foot radiographs 12/30/2019 FINDINGS: Postsurgical changes are again seen of amputation of fifth ray to the proximal metatarsal shaft. Chronic thickening of the proximal lateral fourth metatarsal shaft is unchanged. Moderate fourth tarsometatarsal lateral degenerative spurring with mild joint space narrowing. Mild great toe metatarsophalangeal joint  space narrowing and peripheral osteophytosis. New moderate to high-grade forefoot soft tissue swelling. No definite cortical erosion is seen. No definite acute fracture.  No dislocation. IMPRESSION:: IMPRESSION

## 2021-12-14 NOTE — [place] FL2 (Signed)
?[place] MEDICAID FL2 LEVEL OF CARE SCREENING TOOL  ?  ? ?IDENTIFICATION  ?Patient Name: ?Xavier Turner Birthdate: 1956/06/23 Sex: male Admission Date (Current Location): ?12/14/2021  ?[place] and [place] Number: ? [place] ?  Facility and Address:  ?[facility] [place] [address] [place], [place] [address] ?     Provider Number: ?[id number]  ?Attending Physician Name and Address:  ?Tegeler, Gwenyth Allegra, * ? Relative Name and Phone Number:  ?Joylene Igo, [place], [phone] ?   ?Current Level of Care: ?Other (Comment) (Group Home) Recommended Level of Care: ?[place] Prior Approval Number: ?  ? ?Date Approved/Denied: ?  PASRR Number: ?[id number] B ? ?Discharge Plan: ?SNF ?  ? ?Current Diagnoses: ?Patient Active Problem List  ? Diagnosis Date Noted  ? Pre-ulcerative calluses 01/17/2021  ? Pain due to onychomycosis of toenails of both feet 10/21/2019  ? 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 Acute osteomyelitis of metatarsal bone of left foot ([place]) 07/28/2019  ? Hyperlipidemia associated with type 2 diabetes mellitus ([place]) 07/28/2019  ? Hypertension associated with diabetes ([place]) 07/28/2019  ? Depression 07/28/2019  ? Subacute osteomyelitis, left ankle and foot ([place])   ? Recurrent cellulitis of lower extremity 07/21/2019  ? Recurrent cellulitis 07/21/2019  ? Lactic acidosis 07/21/2019  ? Hyponatremia 07/08/2019  ? GERD (gastroesophageal reflux disease) 07/08/2019  ? Type 2 diabetes mellitus with hyperlipidemia ([place]) 07/08/2019  ? BPH (benign prostatic hyperplasia) 07/08/2019  ? Cellulitis 07/06/2019  ? 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[place])   ? HYPERCHOLESTEROLEMIA 10/05/2009  ? OBSTRUCTIVE SLEEP APNEA 10/05/2009  ? MELANOMA, HX OF 10/05/2009  ? H/O diabetic foot ulcer 10/05/2009  ? ? ?Orientation RESPIRATION BLADDER Height & Weight   ?  ?Self, Time,  Situation, Place ? Normal Incontinent Weight:   ?Height:     ?BEHAVIORAL SYMPTOMS/MOOD NEUROLOGICAL BOWEL NUTRITION STATUS  ?    Continent Diet (Low carb and low cholesterol)  ?AMBULATORY STATUS COMMUNICATION OF NEEDS Skin   ?Extensive Assist Verbally Other (Comment) (Sturge-Weber syndrome and dry skin) ?  ?  ?  ?    ?     ?     ? ? ?Personal Care Assistance Level of Assistance  ?Bathing, Feeding, Dressing Bathing Assistance: Maximum assistance ?Feeding assistance: Independent ?Dressing Assistance: Limited assistance ?   ? ?Functional Limitations Info  ?Sight, Hearing, Speech Sight Info: Impaired (Right side of eye) ?Hearing Info: Adequate ?Speech Info: Adequate  ? ? ?SPECIAL CARE FACTORS FREQUENCY  ?    ?  ?  ?  ?  ?  ?  ?   ? ? ?Contractures Contractures Info: Not present  ? ? ?Additional Factors Info  ?Code Status, Allergies Code Status Info: Full ?Allergies Info: No known allergies listed ?  ?  ?  ?   ? ?Current Medications (12/14/2021):  This is the current hospital active medication list ?No current facility-administered medications for this encounter.  ? ?Current Outpatient Medications  ?Medication Sig Dispense Refill  ? aspirin 81 MG chewable tablet Chew 81 mg by mouth daily.    ? brimonidine (ALPHAGAN) 0.2 % ophthalmic solution Place 1 drop into both eyes 3 (three) times daily. 5 mL 0  ? carboxymethylcellulose (REFRESH PLUS) 0.5 % SOLN Place 1 drop into both eyes 4 (four) times daily as needed (dry eyes).    ? cholecalciferol (VITAMIN D3) 25 MCG (1000 UT)  tablet Take 1 tablet (1,000 Units total) by mouth daily. (Patient taking differently: Take 2,000 Units by mouth daily.) 30 tablet 0  ? dorzolamide-timolol (COSOPT) 22.3-6.8 MG/ML ophthalmic solution Place 1 drop into the right eye 2 (two) times daily. (Patient taking differently: Place 1 drop into both eyes 2 (two) times daily.) 10 mL 0  ? escitalopram (LEXAPRO) 10 MG tablet Take 1 tablet (10 mg total) by mouth daily. 30 tablet 0  ? furosemide (LASIX) 20 MG  tablet Take 20 mg by mouth daily.    ? gabapentin (NEURONTIN) 100 MG capsule Take 1 capsule (100 mg total) by mouth 2 (two) times daily. 60 capsule 0  ? hydrochlorothiazide (HYDRODIURIL) 25 MG tablet Take 1 tablet (25 mg total) by mouth daily. (Patient not taking: No sig reported) 30 tablet 0  ? hydrOXYzine (ATARAX/VISTARIL) 10 MG tablet Take 5 mg by mouth at bedtime as needed for itching.    ? JARDIANCE 10 MG TABS tablet Take 10 mg by mouth daily.    ? meloxicam (MOBIC) 15 MG tablet Take 1 tablet (15 mg total) by mouth daily. 30 tablet 1  ? metFORMIN (GLUCOPHAGE) 1000 MG tablet Take 1 tablet (1,000 mg total) by mouth 2 (two) times daily with a meal. 60 tablet 0  ? Misc. Devices MISC Please supply patient with shower chair with U-shaped seat.    ? nystatin (MYCOSTATIN/NYSTOP) powder Apply 1 application topically 3 (three) times daily as needed (rash). Apply groin, underarms, and belly fold    ? omeprazole (PRILOSEC) 20 MG capsule Take 1 capsule (20 mg total) by mouth daily. (Patient taking differently: Take 40 mg by mouth daily.) 30 capsule 0  ? pioglitazone (ACTOS) 15 MG tablet Take 15 mg by mouth daily.    ? RHOPRESSA 0.02 % SOLN Place 1 drop into both eyes every evening.    ? ROCKLATAN 0.02-0.005 % SOLN Place 1 drop into both eyes at bedtime. (Patient not taking: No sig reported) 2.5 mL 0  ? rosuvastatin (CRESTOR) 20 MG tablet Take 1 tablet (20 mg total) by mouth daily. 30 tablet 0  ? sitaGLIPtin (JANUVIA) 100 MG tablet Take 1 tablet (100 mg total) by mouth daily. 30 tablet 0  ? Skin Protectants, Misc. (MINERIN CREME) CREA Apply 1 application topically daily as needed (dry skin).    ? tamsulosin (FLOMAX) 0.4 MG CAPS capsule Take 1 capsule (0.4 mg total) by mouth daily. 30 capsule 0  ? urea (CARMOL) 40 % CREA Apply 1 application topically daily. To calluses on feet 120 g 2  ? ? ? ?Discharge Medications: ?Please see discharge summary for a list of discharge medications. ? ?Relevant Imaging Results: ? ?Relevant Lab  Results: ? ? ?Additional Information ?SS#[id number]. Moderna covid vaccines plus 1 booster ? ?Raina Mina, LCSWA ? ? ? ? ?

## 2021-12-14 NOTE — Progress Notes (Signed)
CSW spoke with patients group home [facility] [place], [phone] and patients care coordinator [facility]. CSW was told that patient has been approved at [facility] in [place] but they stated they cannot take him with his medicaid long-term pending. Patients medicaid for long term will have to be approved before he can transition. CSW was also told they stated that they needed an FL2 from the hospital. [facility] stated that the DSS worker stated that his PASRR is expired. CSW stated there are two numbers listed and one is expired. CSW provided care coordinator with the number that does not show  expired to see if that will help get him placed. No other needs were indicated.  ?

## 2021-12-14 NOTE — H&P (Addendum)
? ? ? ?Date: 12/14/2021     ?     ?     ?Patient Name:  Xavier Turner MRN: [id number]  ?DOB: 1955/09/25 Age / Sex: 66 y.o., male   ?PCP: Vicenta Aly, [place]    ?     ?Medical Service: Internal Medicine Teaching Service    ?     ?Attending Physician: Dr. Heber [place], Rachel Moulds, DO    ?First Contact: Dr. Farrel Gordon Pager: [phone]  ?Second Contact: Dr. Linwood Dibbles Pager: [phone]  ?     ?After Hours (After 5p/  First Contact Pager: [phone]  ?weekends / holidays): Second Contact Pager: [phone]  ? ?Chief Complaint: L foot pain ? 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  He explains that his problems with pain and infection began after the left fifth toe was amputated in October 2020.  He denies previous diabetic foot wound to the area, and his group home caretaker states that he believes the problems began after Xavier Turner received a steroid injection to the area. ? ?The entire plantar aspect of the left foot is tender to palpation, with most pain localized to lateral plantar aspect.  He describes the pain as feeling like he is being stabbed in that area with multiple knives that are cutting through the flash.  He has not tried anything to alleviate the pain and says that he has been trying to hold out for as long as he could without taking pain medications.  Walking significantly worsens the pain.  He does have some improvement of the pain when he is at rest and had improvement after recent steroid injection to the area a few days ago.  During my exam he rates the pain as an 8 out of 10, but notes that at its worst it is a 10 out of 10. ? ?He denies fever,  chills, unintentional weight loss, weakness, syncope or presyncope. ? ?History obtained from the patient and his caregiver, Aaron Edelman.  Xavier Turner has confirmation of the brain to be contacted regarding medical updates and decision-making. ? ?ED course: Labs at time of presentation revealed elevated BUN 25, low creatinine 0.45, hypoalbuminemia 3.3; microcytic anemia with hemoglobin 11.5, MCV 79.0, but no leukocytosis; initial lactic acid of 1.5 with increased to 2.3 on repeat labs.  Blood cultures were collected.  Left foot x-ray remarkable for worsened moderate to high-grade soft tissue swelling of the forefoot, no definite acute fracture or cortical erosion seen to indicate radiographic evidence of acute osteomyelitis.  No fluids or antibiotics given in ED. ?  ? ?Meds:  ?Current Meds  ?Medication Sig  ? aspirin 81 MG chewable tablet Chew 81 mg by mouth daily.  ? brimonidine (ALPHAGAN) 0.2 % ophthalmic solution Place 1 drop into both eyes 3 (three) times daily.  ? carboxymethylcellulose (REFRESH PLUS) 0.5 % SOLN Place 1 drop into both eyes 4 (four) times daily as needed (dry eyes).  ? cholecalciferol (VITAMIN D3) 25 MCG (1000 UT) tablet Take 1 tablet (1,000 Units total) by mouth daily. (Patient taking differently: Take 2,000 Units by mouth daily.)  ? dorzolamide-timolol (COSOPT) 22.3-6.8 MG/ML ophthalmic solution Place 1 drop into the  right eye 2 (two) times daily. (Patient taking differently: Place 1 drop into both eyes in the morning and at bedtime.)  ? escitalopram (LEXAPRO) 10 MG tablet Take 1 tablet (10 mg total) by mouth daily.  ? ferrous sulfate 325 (65 FE) MG tablet Take 325 mg by mouth daily with breakfast.  ? furosemide (LASIX) 20 MG tablet Take 20 mg by mouth daily.  ? gabapentin (NEURONTIN) 100 MG capsule Take 1 capsule (100 mg total) by mouth 2 (two) times daily.  ? hydrOXYzine (ATARAX/VISTARIL) 10 MG tablet Take 5 mg by mouth at bedtime as needed for itching.  ? JARDIANCE 10 MG TABS tablet Take 10 mg by  mouth daily.  ? lanolin-mineral oil (BABY OIL) OIL 1 drop See admin instructions. Instill 1 drop into each ear canal 2 times a week  ? meloxicam (MOBIC) 15 MG tablet Take 1 tablet (15 mg total) by mouth daily.  ? metFORMIN (GLUCOPHAGE) 1000 MG tablet Take 1 tablet (1,000 mg total) by mouth 2 (two) times daily with a meal.  ? Multiple Vitamin (MULTIVITAMIN) TABS Take 1 tablet by mouth daily with breakfast.  ? nystatin (MYCOSTATIN/NYSTOP) powder Apply 1 application. topically daily as needed (for irritation- groin, belly "fold," and underarms).  ? nystatin cream (MYCOSTATIN) Apply 1 application. topically 2 (two) times daily as needed (to affected areas- for recurring flares).  ? omeprazole (PRILOSEC) 20 MG capsule Take 1 capsule (20 mg total) by mouth daily. (Patient taking differently: Take 40 mg by mouth in the morning.)  ? RHOPRESSA 0.02 % SOLN Place 1 drop into both eyes every evening.  ? rosuvastatin (CRESTOR) 20 MG tablet Take 1 tablet (20 mg total) by mouth daily. (Patient taking differently: Take 20 mg by mouth at bedtime.)  ? sitaGLIPtin (JANUVIA) 100 MG tablet Take 1 tablet (100 mg total) by mouth daily.  ? Skin Protectants, Misc. (MINERIN CREME) CREA Apply 1 application. topically daily as needed (affected areas- for dry skin).  ? tamsulosin (FLOMAX) 0.4 MG CAPS capsule Take 1 capsule (0.4 mg total) by mouth daily.  ? urea (CARMOL) 40 % CREA Apply 1 application topically daily. To calluses on feet (Patient taking differently: Apply 1 application. topically See admin instructions. Apply cream once a day to calluses on both feet)  ? ? ? ?Allergies: ?Allergies as of 12/14/2021  ? (No Known Allergies)  ? ?Past Medical History:  ?Diagnosis Date  ? Diabetes ([place])   ? Glaucoma   ? Hemiparesis ([place])   ? Hypercholesterolemia   ? Hyperlipemia   ? Melanoma ([employer])   ? Melanoma ([place])   ? Obesity   ? OSA (obstructive sleep apnea)   ? Seizure ([place])   ? Seizure disorder ([place])   ? Sturge syndrome   ? Sturge-Weber syndrome    ? ? ?Family History: Did not obtain ? ?Social History: Patient lives in a group home at this time but has recently been approved at a skilled nursing facility where he will have access to better and safer care.  He is able to do some self-care tasks such as brushing his teeth on his own however relies on others for larger tasks. ? ?Review of Systems: ?A complete ROS was negative except as per HPI.  ? ?Physical Exam: ?Blood pressure 119/66, pulse 84, temperature 98.2 ?F (36.8 ?C), temperature source Oral, resp. rate 17, SpO2 94 %. ?Constitutional: Patient is sitting comfortably in personal wheelchair beside hospital bed.  No acute distress. ?Cardio: Regular rate and rhythm.  No murmurs, rubs,  gallops. ?Pulm: Clear to auscultation bilaterally. ?MSK: RLE without increased erythema, edema, warmth.  LLE with increased warmth to mid shin area and 1+ pitting edema with overlying erythema to mid shin.  No open lesions or wounds on the left foot and there are appropriate post-surgical changes noted to the lateral aspect of the left foot.  No increased erythema noted over the left foot.  Plantar aspect of left foot is tender to palpation with increased tenderness over lateral plantar aspect. ?Skin: Skin is warm and dry with increased dryness noted over the left foot.  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 ?Neuro: Patient is awake and appropriately conversive engaging in conversation.  No focal deficit noted. ?Psych: Pleasant mood and affect. ? ? ? ? ? ? ?Assessment & Plan by Problem: ?Principal Problem: ?  Cellulitis of left foot ? ?Non-purulent LLE cellulitis without SIRS criteria ?Hx bilateral LE edema ?Patient presents with prolonged history of L foot pain. He follows with Dr. Sherryle Lis at [facility] who initially started Keflex 500 mg BID September 21, 2021. This has been renewed several times due to incomplete healing of the area of cellulitis to the foot and he has been seen several times over the last  year for high risk foot care at which visits he was noted to have pain in the lateral L plantar foot. At this time he is hemodynamically stable without leukocytosis or fever but did have bump in lactic ac

## 2021-12-14 NOTE — ED Notes (Signed)
Pt given snack bag per MD ?

## 2021-12-14 NOTE — Progress Notes (Signed)
Pt arrived to [address]. Alert and oriented x4. Identified appropriately. ?VS stable, no signs of distress. ?Pt oriented to room and equipment, instructed to use call bell for assistance, and call bell left within  pt reach. ?Bed alarm in place. ?Will continue to monitor pt and treat per MD orders. ?

## 2021-12-14 NOTE — Discharge Planning (Signed)
RNCM consulted regarding safe discharge planning (Home with [facility]).  Physical Therapy evaluation consult placed; will follow up after recommendations from PT.  ? ? ?

## 2021-12-15 ENCOUNTER — Encounter: Payer: Self-pay | Admitting: Podiatry

## 2021-12-15 ENCOUNTER — Observation Stay (HOSPITAL_COMMUNITY): Payer: Medicare Other

## 2021-12-15 DIAGNOSIS — E1159 Type 2 diabetes mellitus with other circulatory complications: Secondary | ICD-10-CM | POA: Diagnosis not present

## 2021-12-15 DIAGNOSIS — D509 Iron deficiency anemia, unspecified: Secondary | ICD-10-CM | POA: Diagnosis not present

## 2021-12-15 DIAGNOSIS — Z89422 Acquired absence of other left toe(s): Secondary | ICD-10-CM | POA: Diagnosis not present

## 2021-12-15 DIAGNOSIS — E785 Hyperlipidemia, unspecified: Secondary | ICD-10-CM | POA: Diagnosis not present

## 2021-12-15 DIAGNOSIS — L03116 Cellulitis of left lower limb: Secondary | ICD-10-CM

## 2021-12-15 DIAGNOSIS — Z7984 Long term (current) use of oral hypoglycemic drugs: Secondary | ICD-10-CM | POA: Diagnosis not present

## 2021-12-15 DIAGNOSIS — Z7982 Long term (current) use of aspirin: Secondary | ICD-10-CM | POA: Diagnosis not present

## 2021-12-15 DIAGNOSIS — F32A Depression, unspecified: Secondary | ICD-10-CM | POA: Diagnosis present

## 2021-12-15 DIAGNOSIS — N400 Enlarged prostate without lower urinary tract symptoms: Secondary | ICD-10-CM | POA: Diagnosis present

## 2021-12-15 DIAGNOSIS — R944 Abnormal results of kidney function studies: Secondary | ICD-10-CM | POA: Diagnosis present

## 2021-12-15 DIAGNOSIS — Z79899 Other long term (current) drug therapy: Secondary | ICD-10-CM | POA: Diagnosis not present

## 2021-12-15 DIAGNOSIS — E11628 Type 2 diabetes mellitus with other skin complications: Secondary | ICD-10-CM | POA: Diagnosis present

## 2021-12-15 DIAGNOSIS — E1169 Type 2 diabetes mellitus with other specified complication: Secondary | ICD-10-CM

## 2021-12-15 DIAGNOSIS — Z791 Long term (current) use of non-steroidal anti-inflammatories (NSAID): Secondary | ICD-10-CM | POA: Diagnosis not present

## 2021-12-15 DIAGNOSIS — I152 Hypertension secondary to endocrine disorders: Secondary | ICD-10-CM | POA: Diagnosis not present

## 2021-12-15 DIAGNOSIS — Q8589 Other phakomatoses, not elsewhere classified: Secondary | ICD-10-CM | POA: Diagnosis not present

## 2021-12-15 DIAGNOSIS — G4733 Obstructive sleep apnea (adult) (pediatric): Secondary | ICD-10-CM | POA: Diagnosis present

## 2021-12-15 DIAGNOSIS — G40909 Epilepsy, unspecified, not intractable, without status epilepticus: Secondary | ICD-10-CM | POA: Diagnosis present

## 2021-12-15 DIAGNOSIS — E8809 Other disorders of plasma-protein metabolism, not elsewhere classified: Secondary | ICD-10-CM | POA: Diagnosis present

## 2021-12-15 DIAGNOSIS — E7800 Pure hypercholesterolemia, unspecified: Secondary | ICD-10-CM | POA: Diagnosis present

## 2021-12-15 DIAGNOSIS — Z85820 Personal history of malignant melanoma of skin: Secondary | ICD-10-CM | POA: Diagnosis not present

## 2021-12-15 LAB — BASIC METABOLIC PANEL
Anion gap: 6 (ref 5–15)
BUN: 17 mg/dL (ref 8–23)
CO2: 29 mmol/L (ref 22–32)
Calcium: 8.7 mg/dL — ABNORMAL LOW (ref 8.9–10.3)
Chloride: 104 mmol/L (ref 98–111)
Creatinine, Ser: 0.43 mg/dL — ABNORMAL LOW (ref 0.61–1.24)
GFR, Estimated: >60 mL/min (ref >60)
Glucose, Bld: 115 mg/dL — ABNORMAL HIGH (ref 70–99)
Potassium: 3.7 mmol/L (ref 3.5–5.1)
Sodium: 139 mmol/L (ref 135–145)

## 2021-12-15 LAB — CBC
HCT: 38.1 % — ABNORMAL LOW (ref 39.0–52.0)
Hemoglobin: 11.4 g/dL — ABNORMAL LOW (ref 13.0–17.0)
MCH: 23.4 pg — ABNORMAL LOW (ref 26.0–34.0)
MCHC: 29.9 g/dL — ABNORMAL LOW (ref 30.0–36.0)
MCV: 78.1 fL — ABNORMAL LOW (ref 80.0–100.0)
Platelets: 190 10*3/uL (ref 150–400)
RBC: 4.88 MIL/uL (ref 4.22–5.81)
RDW: 18.9 % — ABNORMAL HIGH (ref 11.5–15.5)
WBC: 8.1 10*3/uL (ref 4.0–10.5)
nRBC: 0 % (ref 0.0–0.2)

## 2021-12-15 MED ORDER — VANCOMYCIN HCL 750 MG/150ML IV SOLN
750.0000 mg | Freq: Two times a day (BID) | INTRAVENOUS | Status: DC
  Administered 2021-12-15 – 2021-12-18 (×6): 750 mg via INTRAVENOUS
  Filled 2021-12-15 (×7): qty 150

## 2021-12-15 MED ORDER — VANCOMYCIN HCL 1500 MG/300ML IV SOLN
1500.0000 mg | Freq: Once | INTRAVENOUS | Status: AC
  Administered 2021-12-15: 1500 mg via INTRAVENOUS
  Filled 2021-12-15: qty 300

## 2021-12-15 NOTE — Progress Notes (Signed)
Pharmacy Antibiotic Note ? ?Xavier Turner is a 66 y.o. male admitted on 12/14/2021 with cellulitis.  Pt has had ongoing cellulitis for several months, s/p multiple courses of Keflex.  Pt is being admitted for IV antibiotics.  Pharmacy has been consulted for Vancomyin dosing. ? ?Plan: ?Vancomycin '1500mg'$  IV x 1 ?Vancomycin 750 mg IV q12h ?eAUC 502, using SCr 0.8 and Vd 0.5 ?Anticipate no levels needed given plan for short course. ?Rx will continue to follow renal function, cx data, and clinical progress. ? ? ?Weight: 88.5 kg (195 lb 1.7 oz) ? ?Temp (24hrs), Avg:97.7 ?F (36.5 ?C), Min:97.4 ?F (36.3 ?C), Max:98.2 ?F (36.8 ?C) ? ?Recent Labs  ?Lab 12/14/21 ?1500 12/14/21 ?1509 12/14/21 ?1640 12/15/21 ?5427  ?WBC 8.2  --   --  8.1  ?CREATININE 0.45*  --   --  0.43*  ?LATICACIDVEN  --  1.5 2.3*  --   ?  ?CrCl cannot be calculated (Unknown ideal weight.).   ? ?No Known Allergies ? ?Antimicrobials this admission: ?Ancef 3/23 >>  ?Vancomycin 3/24 >>  ? ?Dose adjustments this admission: ? ? ?Microbiology results: ?3/23 BCx:  ? ? ?Thank you for allowing pharmacy to be a part of this patient?s care. ? ?[employer], Pharm.D., BCPS ?Clinical Pharmacist ?Clinical phone for 12/15/2021 from 7:30-3:00 is [phone]. ? ?**Pharmacist phone directory can be found on [email or web address] listed under [place]. ? ?12/15/2021 10:12 AM ? ? ?

## 2021-12-15 NOTE — Plan of Care (Signed)
?  Problem: Education: ?Goal: Knowledge of General Education information will improve ?Description: Including pain rating scale, medication(s)/side effects and non-pharmacologic comfort measures ?Outcome: Progressing ?  ?Problem: Health Behavior/Discharge Planning: ?Goal: Ability to manage health-related needs will improve ?Outcome: Progressing ?  ?Problem: Clinical Measurements: ?Goal: Ability to maintain clinical measurements within normal limits will improve ?Outcome: Progressing ?Goal: Respiratory complications will improve ?Outcome: Progressing ?  ?Problem: Nutrition: ?Goal: Adequate nutrition will be maintained ?Outcome: Progressing ?  ?Problem: Pain Managment: ?Goal: General experience of comfort will improve ?Outcome: Progressing ?  ?Problem: Safety: ?Goal: Ability to remain free from injury will improve ?Outcome: Progressing ?  ?Problem: Skin Integrity: ?Goal: Risk for impaired skin integrity will decrease ?Outcome: Progressing ?  ?

## 2021-12-15 NOTE — Progress Notes (Signed)
? 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  ? ?Overnight Events: None ? ?Interim History: Patient feels okay this morning and states that his pain is about the same as yesterday. He was able to walk to the restroom this morning without significant pain which is an improvement from admission. ? ?OBJECTIVE:  ?Vital Signs: ?Vitals:  ? 12/14/21 1635 12/14/21 2009 12/15/21 0030 12/15/21 0657  ?BP: 119/66 117/63 115/68 114/65  ?Pulse: 84 80 75 65  ?Resp: '17 16 16 18  '$ ?Temp:  97.7 ?F (36.5 ?C) 98.1 ?F (36.7 ?C) 97.6 ?F (36.4 ?C)  ?TempSrc:  Oral Oral Oral  ?SpO2: 94% 95% 96% 96%  ? ?Supplemental O2: Room Air ?SpO2: 96 % ? ?There were no vitals filed for this visit. ? ? ?Intake/Output Summary (Last 24 hours) at 12/15/2021 6063 ?Last data filed at 12/15/2021 0600 ?Gross per 24 hour  ?Intake 1231.15 ml  ?Output 250 ml  ?Net 981.15 ml  ? ?Net IO Since Admission: 981.15 mL [12/15/21 0712] ? ?Physical Exam: ?Constitutional: Patient is resting comfortably in bed. No acute distress noted. ?Cardio: Regular rate and rhythm.  No murmurs, rubs, gallops. ?Pulm: Normal work of breathing on room air. ?MSK: LLE erythema is relatively unchanged from admission but pitting edema is improved, now 0-1+ dependent pitting edema. Minimally increased warmth compared to RLE. There is a dark scab on the underside of the foot but it is not draining or bleeding at this time and no increased warmth to the lesion. Tenderness persists from heel through toes and lateral foot as previously noted. ?Skin: Skin is warm and dry with increased dryness noted over  the left foot.  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 ?Neuro: Patient is awake, alert, and oriented. ?Psych: Pleasant mood and affect. ? ?Patient Lines/Drains/Airways Status   ? ? Active Line/Drains/Airways   ? ? Name Placement date Placement time Site Days  ? Peripheral IV 07/24/19 Anterior;Right Forearm 07/24/19  0609  Forearm  875  ? Peripheral IV 12/14/21 20 G Distal;Left;Posterior Forearm 12/14/21  --  Forearm  1  ? Incision (Closed) 07/24/19 Foot Left 07/24/19  0754  -- 875  ? Wound / Incision (Open or Dehisced) 07/22/19 Diabetic ulcer Foot Left 07/22/19  1233  Foot  877  ? Wound / Incision (Open or Dehisced) 12/14/21 (MASD) Moisture Associated Skin Damage Groin Anterior;Left;Right;Lateral 12/14/21  1949  Groin  1  ? ?  ?  ? ?  ? ? ? ?ASSESSMENT/PLAN:  ?Assessment: ?Principal Problem: ?  Cellulitis of left foot ? ? ?Plan: ?LLE cellulitis ?Hx bilateral LE edema ?Given inadequate response to multiple rounds of Keflex, he was started on cefazolin at admission with decision to expand further to vancomycin 03/24. Remains hemodynamically stable without leukocytosis or fever and he is s/p LR 100 cc/h. Blood cultures show NGTD. Exam today revealed that LLE erythema is relatively unchanged from admission but pitting edema is improved, now 0-1+ dependent pitting edema, with minimally increased warmth compared to RLE.  ?-Continue home lasix 20 mg daily for history of bilateral LE edema ?-F/u blood cultures ?-Continue cefazolin 1 g IV q8h ?-Start vancomycin  1500 mg IV x 1 dose, followed by 750 mg IV BID ? -Consider transitioning to linezolid if continued improvement is seen on exam 03/25 ?-Trend CBC, fever curve ?  ?Type 2 diabetes mellitus ?Last HbA1c in 06/2021 of 6.4%. Outpatient regimen of tradjenta 5 mg daily, metformin 1000 mg BID, jardiance 10 mg daily. ?-Continue home tradjenta 5 mg daily, metformin 1000 mg BID, jardiance 10 mg daily. ?  ?Hyperlipidemia ?Chronic condition currently managed outpatient with  rosuvastatin 20 mg daily. ?-Continue home rosuvastatin 20 mg daily. ?  ?History of iron deficiency anemia ?Hemoglobin is stable at this time. He is hemodynamically stable without signs of acute bleed. ?-Continue home ferrous sulfate ?-Trend CBC ? ?Best Practice: ?Diet: Diabetic diet ?IVF: None ?VTE: enoxaparin (LOVENOX) injection 40 mg Start: 12/14/21 1830 ?Code: Full ?AB: Cefazolin 1 g IV TID, vancomycin 750 mg IV BID ?Therapy Recs: SNF, DME: none ?Family Contact: Aaron Edelman, helper from group home, called and notified. ?DISPO: Anticipated discharge in 1-2 days to [facility] pending Medical stability and Insurance for SNF coverage. ? ?Signature: ?Farrel Gordon, D.O.  ?Internal Medicine Resident, PGY-1 ?[facility]  ?Pager: [phone] ?7:12 AM, 12/15/2021  ? ?Please contact the on call pager after 5 pm and on weekends at [phone]. ? ?

## 2021-12-15 NOTE — NC FL2 (Signed)
?[place] MEDICAID FL2 LEVEL OF CARE SCREENING TOOL  ?  ? ?IDENTIFICATION  ?Patient Name: ?Xavier Turner Birthdate: 03-12-1956 Sex: male Admission Date (Current Location): ?12/14/2021  ?[place] and [place] Number: ? [place] ?  Facility and Address:  ?[facility], [place] [address], [place], [place] [address] ?     Provider Number: ?[id number]  ?Attending Physician Name and Address:  ?Lucious Groves, DO ? Relative Name and Phone Number:  ?Joylene Igo, [place], [phone] ?   ?Current Level of Care: ?Other (Comment) (Group Home) Recommended Level of Care: ?[place] Prior Approval Number: ?  ? ?Date Approved/Denied: ?  PASRR Number: ?[id number] B ? ?Discharge Plan: ?SNF ?  ? ?Current Diagnoses: ?Patient Active Problem List  ? Diagnosis Date Noted  ? Iron deficiency anemia 12/15/2021  ? Cellulitis of left foot 12/14/2021  ? Pre-ulcerative calluses 01/17/2021  ? Pain due to onychomycosis of toenails of both feet 10/21/2019  ? 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 ([place]) 07/31/2019  ? Acute osteomyelitis of metatarsal bone of left foot ([place]) 07/28/2019  ? Hyperlipidemia associated with type 2 diabetes mellitus ([place]) 07/28/2019  ? Hypertension associated with diabetes ([place]) 07/28/2019  ? Depression 07/28/2019  ? Subacute osteomyelitis, left ankle and foot ([place])   ? Recurrent cellulitis of lower extremity 07/21/2019  ? Recurrent cellulitis 07/21/2019  ? Lactic acidosis 07/21/2019  ? Hyponatremia 07/08/2019  ? GERD (gastroesophageal reflux disease) 07/08/2019  ? Type 2 diabetes mellitus with hyperlipidemia ([place]) 07/08/2019  ? BPH (benign prostatic hyperplasia) 07/08/2019  ? Cellulitis 07/06/2019  ? 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[place])   ? HYPERCHOLESTEROLEMIA 10/05/2009  ? OBSTRUCTIVE SLEEP APNEA 10/05/2009  ? MELANOMA, HX OF 10/05/2009  ? H/O diabetic foot ulcer 10/05/2009   ? ? ?Orientation RESPIRATION BLADDER Height & Weight   ?  ?Self, Time, Situation, Place ? Normal Continent Weight: 195 lb 1.7 oz (88.5 kg) ?Height:     ?BEHAVIORAL SYMPTOMS/MOOD NEUROLOGICAL BOWEL NUTRITION STATUS  ?    Continent Diet (Low carb and low cholesterol)  ?AMBULATORY STATUS COMMUNICATION OF NEEDS Skin   ?Extensive Assist Verbally Other (Comment) (Sturge-Weber syndrome and dry skin) ?  ?  ?  ?    ?     ?     ? ? ?Personal Care Assistance Level of Assistance  ?Bathing, Feeding, Dressing Bathing Assistance: Limited assistance ?Feeding assistance: Independent ?Dressing Assistance: Limited assistance ?   ? ?Functional Limitations Info  ?Sight, Hearing, Speech Sight Info: Impaired (Right side of eye) ?Hearing Info: Adequate ?Speech Info: Adequate  ? ? ?SPECIAL CARE FACTORS FREQUENCY  ?PT (By licensed PT), OT (By licensed OT)   ?  ?PT Frequency: 5x week ?OT Frequency: 5x week ?  ?  ?  ?   ? ? ?Contractures Contractures Info: Not present  ? ? ?Additional Factors Info  ?Code Status, Allergies Code Status Info: Full ?Allergies Info: No known allergies listed ?  ?  ?  ?   ? ?Current Medications (12/15/2021):  This is the current hospital active medication list ?Current Facility-Administered Medications  ?Medication Dose Route Frequency Provider Last Rate Last Admin  ? acetaminophen (TYLENOL) tablet 650 mg  650 mg Oral Q6H PRN Lacinda Axon, MD      ? Or  ? acetaminophen (TYLENOL) suppository 650 mg  650 mg Rectal Q6H PRN Lacinda Axon, MD      ?  aspirin EC tablet 81 mg  81 mg Oral Daily Farrel Gordon, DO   81 mg at 12/15/21 1009  ? bisacodyl (DULCOLAX) EC tablet 5 mg  5 mg Oral Daily PRN Lacinda Axon, MD      ? brimonidine (ALPHAGAN) 0.2 % ophthalmic solution 1 drop  1 drop Both Eyes TID Farrel Gordon, DO   1 drop at 12/15/21 1011  ? cholecalciferol (VITAMIN D3) tablet 2,000 Units  2,000 Units Oral Daily Farrel Gordon, DO   2,000 Units at 12/15/21 1009  ? dorzolamide-timolol (COSOPT) 22.3-6.8 MG/ML  ophthalmic solution 1 drop  1 drop Both Eyes BID Farrel Gordon, DO   1 drop at 12/15/21 1011  ? empagliflozin (JARDIANCE) tablet 10 mg  10 mg Oral Daily Farrel Gordon, DO   10 mg at 12/15/21 1009  ? enoxaparin (LOVENOX) injection 40 mg  40 mg Subcutaneous Q24H Lacinda Axon, MD   40 mg at 12/14/21 1919  ? escitalopram (LEXAPRO) tablet 10 mg  10 mg Oral Daily Farrel Gordon, DO   10 mg at 12/15/21 1009  ? ferrous sulfate tablet 325 mg  325 mg Oral Q breakfast Farrel Gordon, DO   325 mg at 12/15/21 0818  ? furosemide (LASIX) tablet 20 mg  20 mg Oral Daily Farrel Gordon, DO   20 mg at 12/15/21 1009  ? gabapentin (NEURONTIN) capsule 100 mg  100 mg Oral BID Farrel Gordon, DO   100 mg at 12/15/21 1009  ? hydrOXYzine (ATARAX) tablet 5 mg  5 mg Oral QHS PRN Farrel Gordon, DO      ? linagliptin (TRADJENTA) tablet 5 mg  5 mg Oral Daily Farrel Gordon, DO   5 mg at 12/15/21 1009  ? meloxicam (MOBIC) tablet 15 mg  15 mg Oral Daily Farrel Gordon, DO   15 mg at 12/15/21 1009  ? metFORMIN (GLUCOPHAGE) tablet 1,000 mg  1,000 mg Oral BID WC Farrel Gordon, DO   1,000 mg at 12/15/21 0818  ? oxyCODONE (Oxy IR/ROXICODONE) immediate release tablet 5 mg  5 mg Oral Q4H PRN Lacinda Axon, MD      ? pantoprazole (PROTONIX) EC tablet 40 mg  40 mg Oral Daily Farrel Gordon, DO   40 mg at 12/15/21 1009  ? polyethylene glycol (MIRALAX / GLYCOLAX) packet 17 g  17 g Oral Daily PRN Lacinda Axon, MD      ? rosuvastatin (CRESTOR) tablet 20 mg  20 mg Oral Daily Farrel Gordon, DO   20 mg at 12/15/21 1009  ? tamsulosin (FLOMAX) capsule 0.4 mg  0.4 mg Oral QPC supper Farrel Gordon, DO      ? vancomycin (VANCOREADY) IVPB 750 mg/150 mL  750 mg Intravenous Q12H Hammons, Kimberly B, RPH      ? ? ? ?Discharge Medications: ?Please see discharge summary for a list of discharge medications. ? ?Relevant Imaging Results: ? ?Relevant Lab Results: ? ? ?Additional Information ?SS#[phone]. Moderna covid vaccines plus 1 booster ? ?Joanne Chars, LCSW ? ? ? ? ?

## 2021-12-15 NOTE — Progress Notes (Signed)
Physical Therapy Treatment ?Patient Details ?Name: Xavier Turner ?MRN: [id number] ?DOB: March 25, 1956 ?Today's Date: 12/15/2021 ? ? ?History of Present Illness Pt presented to ED on 3/23 from group home with worsening left foot pain due to cellulitis. 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 ? ?  ?PT Comments  ? ? Pt reports pain in L foot is improving (reports change in pain meds) and he was able to ambulate 70' in hallway. Did require min A for balance and mod cues for transfers.  Continue to recommend SNF due to fall risk and pt at group home.  ?   ?Recommendations for follow up therapy are one component of a multi-disciplinary discharge planning process, led by the attending physician.  Recommendations may be updated based on patient status, additional functional criteria and insurance authorization. ? ?Follow Up Recommendations ? Skilled nursing-short term rehab (<3 hours/day) ?  ?  ?Assistance Recommended at Discharge Frequent or constant Supervision/Assistance  ?Patient can return home with the following A little help with walking and/or transfers;A little help with bathing/dressing/bathroom ?  ?Equipment Recommendations ? None recommended by PT  ?  ?Recommendations for Other Services   ? ? ?  ?Precautions / Restrictions Precautions ?Precautions: Fall ?Required Braces or Orthoses: Other Brace ?Other Brace: Pt using lt foot boot/splint for amb ?Restrictions ?Weight Bearing Restrictions: No  ?  ? ?Mobility ? Bed Mobility ?Overal bed mobility: Needs Assistance ?Bed Mobility: Supine to Sit ?  ?  ?Supine to sit: Min assist ?  ?  ?General bed mobility comments: Assist to elevate trunk to sit; increased time for transfer ?  ? ?Transfers ?Overall transfer level: Needs assistance ?Equipment used: Straight cane ?Transfers: Sit to/from Stand ?Sit to Stand: Min assist ?  ?  ?  ?  ?  ?General transfer comment: Sit to stand x 3 during  session; required repeat attempts, cues for hand placement and use of momentum ?  ? ?Ambulation/Gait ?Ambulation/Gait assistance: Min assist ?Gait Distance (Feet): 70 Feet ?Assistive device: Straight cane ?Gait Pattern/deviations: Step-through pattern, Decreased stride length, Shuffle ?Gait velocity: decreased ?  ?  ?General Gait Details: Ambulated with PRAFO on L foot and tends to internally rotate; ambulated 38' with cues for posture and increasing BOS; min A to steady ? ? ?Stairs ?  ?  ?  ?  ?  ? ? ?Wheelchair Mobility ?  ? ?Modified Rankin (Stroke Patients Only) ?  ? ? ?  ?Balance Overall balance assessment: Needs assistance ?Sitting-balance support: No upper extremity supported, Feet supported ?Sitting balance-Leahy Scale: Fair ?  ?  ?Standing balance support: Single extremity supported ?Standing balance-Leahy Scale: Poor ?Standing balance comment: cane and min guard for static standing ?  ?  ?  ?  ?  ?  ?  ?  ?  ?  ?  ?  ? ?  ?Cognition Arousal/Alertness: Awake/alert ?Behavior During Therapy: [facility] for tasks assessed/performed ?Overall Cognitive Status: History of cognitive impairments - at baseline ?  ?  ?  ?  ?  ?  ?  ?  ?  ?  ?  ?  ?  ?  ?  ?  ?  ?  ?  ? ?  ?Exercises   ? ?  ?General Comments   ?  ?  ? ?Pertinent Vitals/Pain Pain Assessment ?Pain Assessment: No/denies pain  ? ? ?Home Living   ?  ?  ?  ?  ?  ?  ?  ?  ?  ?   ?  ?  Prior Function    ?  ?  ?   ? ?PT Goals (current goals can now be found in the care plan section) Progress towards PT goals: Progressing toward goals ? ?  ?Frequency ? ? ? Min 2X/week ? ? ? ?  ?PT Plan Current plan remains appropriate  ? ? ?Co-evaluation   ?  ?  ?  ?  ? ?  ?AM-PAC PT "6 Clicks" Mobility   ?Outcome Measure ? Help needed turning from your back to your side while in a flat bed without using bedrails?: A Little ?Help needed moving from lying on your back to sitting on the side of a flat bed without using bedrails?: A Little ?Help needed moving to and from a bed to a chair  (including a wheelchair)?: A Little ?Help needed standing up from a chair using your arms (e.g., wheelchair or bedside chair)?: A Little ?Help needed to walk in hospital room?: A Little ?Help needed climbing 3-5 steps with a railing? : Total ?6 Click Score: 16 ? ?  ?End of Session   ?Activity Tolerance: Patient tolerated treatment well ?Patient left: in chair;with call bell/phone within reach;with chair alarm set ?Nurse Communication: Mobility status ?PT Visit Diagnosis: Other abnormalities of gait and mobility (R26.89);Pain ?Pain - Right/Left: Left ?Pain - part of body: Ankle and joints of foot ?  ? ? ?Time: 8250-5397 ?PT Time Calculation (min) (ACUTE ONLY): 25 min ? ?Charges:  $Gait Training: 8-22 mins ?$Therapeutic Activity: 8-22 mins          ?          ? ?Abran Richard, PT ?Acute Rehab Services ?Pager [phone] ?[facility] [phone] ? ? ? ?Xavier Turner ?12/15/2021, 5:16 PM ? ?

## 2021-12-15 NOTE — TOC Initial Note (Signed)
Transition of Care (TOC) - Initial/Assessment Note  ? ? ?Patient Details  ?Name: Xavier Turner ?MRN: [id number] ?Date of Birth: [birth year] ? ?Transition of Care [facility]) CM/SW Contact:    ?Joanne Chars, LCSW ?Phone Number: ?12/15/2021, 3:14 PM ? ?Clinical Narrative:   Pt has legal guardian, sister Shelly Rubenstein.  CSW spoke with pt in and introduced self, then spoke with sister on phone.  Discussed recommendation for SNF, sister would like to discuss with [facility], [place]: [phone].  CSW received call from [facility], pt has LTC bed at [facility] in [place] once medicaid is on place.  [facility] would suggest calling them for STR as well.  Unclear on when medicaid will be active but has been submitted/applied for for some time.  CSW spoke with Aaron Edelman at the group home where pt has been staying--he understood that if pt STR ended prior to medicaid being active, pt would have to return and said they can do this.  CSW spoke with Ricky/admissions at [place].  Per Audry Pili, [facility] has not offered pt a LTC bed and is not planning to do so.  They are also not willing to consider him for STR.  CSW called [facility] and [name] back and updated them.  They were unaware of this, said the plan has been LTC at [facility] for several months.  [name] in agreement to send out referral to [facility] area SNF for STR, which was done.               ? ? ?Expected Discharge Plan: [place] ?Barriers to Discharge: SNF Pending bed offer ? ? ?Patient Goals and CMS Choice ?  ?  ?  ? ?Expected Discharge Plan and Services ?Expected Discharge Plan: [place] ?In-house Referral: Clinical Social Work ?  ?Post Acute Care Choice: [place] ?Living arrangements for the past 2 months: Group Home ?                ?  ?  ?  ?  ?  ?  ?  ?  ?  ?  ? ?Prior Living Arrangements/Services ?Living arrangements for the past 2 months: Group Home ?Lives with:: Facility Resident ?Patient language and need for interpreter  reviewed:: Yes ?Do you feel safe going back to the place where you live?: Yes      ?Need for Family Participation in Patient Care: Yes (Comment) ?Care giver support system in place?: Yes (comment) ?Current home services: Other (comment) (none) ?Criminal Activity/Legal Involvement Pertinent to Current Situation/Hospitalization: No - Comment as needed ? ?Activities of Daily Living ?Home Assistive Devices/Equipment: Cane (specify quad or straight) ?ADL Screening (condition at time of admission) ?Patient's cognitive ability adequate to safely complete daily activities?: Yes ?Is the patient deaf or have difficulty hearing?: No ?Does the patient have difficulty seeing, even when wearing glasses/contacts?: No ?Does the patient have difficulty concentrating, remembering, or making decisions?: No ?Patient able to express need for assistance with ADLs?: Yes ?Does the patient have difficulty dressing or bathing?: Yes ?Independently performs ADLs?: No ?Communication: Independent ?Dressing (OT): Needs assistance ?Is this a change from baseline?: Pre-admission baseline ?Grooming: Independent ?Feeding: Independent ?Bathing: Needs assistance ?Is this a change from baseline?: Pre-admission baseline ?Toileting: Needs assistance ?Is this a change from baseline?: Pre-admission baseline ?In/Out Bed: Needs assistance ?Is this a change from baseline?: Pre-admission baseline ?Walks in Home: Needs assistance ?Is this a change from baseline?: Pre-admission baseline ?Does the patient have difficulty walking or climbing stairs?: Yes ?Weakness  of Legs: Left ?Weakness of Arms/Hands: Left ? ?Permission Sought/Granted ?Permission sought to share information with :  (Pt sister Jeannene Patella is his legal guardian) ?  ?   ?   ?   ?   ? ?Emotional Assessment ?Appearance:: Appears stated age ?Attitude/Demeanor/Rapport: Engaged ?Affect (typically observed): Appropriate, Pleasant ?Orientation: : Oriented to Self, Oriented to Place, Oriented to  Time, Oriented to  Situation ?Alcohol / Substance Use: Not Applicable ?Psych Involvement: No (comment) ? ?Admission diagnosis:  Cellulitis of left foot [L03.116] ?Pain of left lower extremity [M79.605] ?Patient Active Problem List  ? Diagnosis Date Noted  ? Iron deficiency anemia 12/15/2021  ? Cellulitis of left foot 12/14/2021  ? Pre-ulcerative calluses 01/17/2021  ? Pain due to onychomycosis of toenails of both feet 10/21/2019  ? 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 Acute osteomyelitis of metatarsal bone of left foot ([place]) 07/28/2019  ? Hyperlipidemia associated with type 2 diabetes mellitus ([place]) 07/28/2019  ? Hypertension associated with diabetes ([place]) 07/28/2019  ? Depression 07/28/2019  ? Subacute osteomyelitis, left ankle and foot ([place])   ? Recurrent cellulitis of lower extremity 07/21/2019  ? Recurrent cellulitis 07/21/2019  ? Lactic acidosis 07/21/2019  ? Hyponatremia 07/08/2019  ? GERD (gastroesophageal reflux disease) 07/08/2019  ? Type 2 diabetes mellitus with hyperlipidemia ([place]) 07/08/2019  ? BPH (benign prostatic hyperplasia) 07/08/2019  ? Cellulitis 07/06/2019  ? 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[place])   ? HYPERCHOLESTEROLEMIA 10/05/2009  ? OBSTRUCTIVE SLEEP APNEA 10/05/2009  ? MELANOMA, HX OF 10/05/2009  ? H/O diabetic foot ulcer 10/05/2009  ? ?PCP:  Vicenta Aly, [place] ?Pharmacy:   ?[place] ?[place] [address] ?Phone: [phone] Fax: [phone] ? ?[place], [place] [address] ?[place] [address] ?[address] ?[place] [address] ?Phone: [phone] Fax: [phone] ? ? ? ? ?Social Determinants of Health (SDOH) Interventions ?  ? ?Readmission Risk Interventions ? ?  07/22/2019  ? 12:27 PM  ?Readmission Risk Prevention Plan  ?Post Dischage Appt Not Complete  ?Appt Comments further work up SNF vs  return to group home  ?Medication Screening Complete  ?Transportation Screening Complete  ? ? ? ?

## 2021-12-15 NOTE — Progress Notes (Signed)
?  Subjective:  ?Patient ID: Xavier Turner, male    DOB: Apr 20, 1956,  MRN: [id number] ? ?Chief Complaint  ?Patient presents with  ? cellulitis  ?    2 week f/u for recheck cellulitis left foot  ? ? ?66 y.o. male returns with the above complaint. History confirmed with patient.  Foot and ankle continues to be quite swollen mostly plantar ? ?Objective:  ?Physical Exam: ?warm, good capillary refill, no trophic changes or ulcerative lesions, normal DP and PT pulses, and abnormal sensory exam.  He has severe pes cavus foot deformity.  Prominent area of tenderness over the fifth metatarsal base plantarly.  Erythema and peeling skin has resolved.  Thickened elongated nails with yellow discoloration and subungual debris x10 ? ?Summary:  ?RIGHT:  ?- No evidence of common femoral vein obstruction.  ?   ?LEFT:  ?- There is no evidence of deep vein thrombosis in the lower extremity.  ?- There is no evidence of superficial venous thrombosis.  ?   ?- No cystic structure found in the popliteal fossa.  ?   ? ?*See table(s) above for measurements and observations.  ? ?Electronically signed by Orlie Pollen on 10/06/2021 at 4:20:40 PM.  ? ?Lab work was unrevealing for gout or infectious process ?Assessment:  ? ?1. Bursitis of left foot   ? ? ? ? ?Plan:  ?Patient was evaluated and treated and all questions answered. ? ?I reviewed the results of his lab work with him.  At this point I do not think this is cellulitis or gout and we have explored all options of treatment for this.  I recommended a corticosteroid injection into an area that there is a fluctuant bursa deep to the fifth metatarsal head and separate to the base.  This was done after prep with alcohol and 5 mg of Kenalog and 5 mg of dexamethasone was injected into each area.  He tolerated well. ? ?Return in about 6 weeks (around 01/23/2022).  ? ? ?

## 2021-12-15 NOTE — Progress Notes (Signed)
Able to obtain limited MRI exam. Pt began moving towards the end of pre-contrast imaging, immobilized foot best as possible with cushions to prevent motion degradation, made several attempts to verbally redirect pt and multiple repeat images with no improvement. Non-diagnostic image quality did not justify contrast administration. Order changed from W/WO to [place] to reflect this. Exam completed as a limited WO study. Best possible images obtained. ?

## 2021-12-16 LAB — BASIC METABOLIC PANEL
Anion gap: 7 (ref 5–15)
BUN: 15 mg/dL (ref 8–23)
CO2: 27 mmol/L (ref 22–32)
Calcium: 8.9 mg/dL (ref 8.9–10.3)
Chloride: 103 mmol/L (ref 98–111)
Creatinine, Ser: 0.41 mg/dL — ABNORMAL LOW (ref 0.61–1.24)
GFR, Estimated: >60 mL/min (ref >60)
Glucose, Bld: 111 mg/dL — ABNORMAL HIGH (ref 70–99)
Potassium: 3.8 mmol/L (ref 3.5–5.1)
Sodium: 137 mmol/L (ref 135–145)

## 2021-12-16 LAB — CBC
HCT: 38.5 % — ABNORMAL LOW (ref 39.0–52.0)
Hemoglobin: 11.4 g/dL — ABNORMAL LOW (ref 13.0–17.0)
MCH: 23.4 pg — ABNORMAL LOW (ref 26.0–34.0)
MCHC: 29.6 g/dL — ABNORMAL LOW (ref 30.0–36.0)
MCV: 78.9 fL — ABNORMAL LOW (ref 80.0–100.0)
Platelets: 179 10*3/uL (ref 150–400)
RBC: 4.88 MIL/uL (ref 4.22–5.81)
RDW: 18.6 % — ABNORMAL HIGH (ref 11.5–15.5)
WBC: 8 10*3/uL (ref 4.0–10.5)
nRBC: 0 % (ref 0.0–0.2)

## 2021-12-16 NOTE — Progress Notes (Signed)
? ?Subjective:  ? ?Hospital day:2  ? ?Overnight event: No acute events overnight ? ?Interim history: Patient evaluated at the bedside sitting comfortably in a chair. Patient has no acute concerns. Denies any fevers, chills, chest pain or shortness of breath. States he has been drinking and eating fine.  ? ?Objective: ? ?Vital signs in last 24 hours: ?Vitals:  ? 12/15/21 1157 12/15/21 1537 12/15/21 2220 12/16/21 0656  ?BP: (!) 116/58 130/70 133/73 121/77  ?Pulse: 73 73 73 62  ?Resp: '15 16 16 18  '$ ?Temp: 97.9 ?F (36.6 ?C) (!) 97.5 ?F (36.4 ?C) 97.8 ?F (36.6 ?C) 97.7 ?F (36.5 ?C)  ?TempSrc: Oral Oral Oral Oral  ?SpO2: 97% 97% 97% 96%  ?Weight:      ? ? ?Filed Weights  ? 12/15/21 0818  ?Weight: 88.5 kg  ? ? ? ?Intake/Output Summary (Last 24 hours) at 12/16/2021 0706 ?Last data filed at 12/16/2021 0600 ?Gross per 24 hour  ?Intake 840 ml  ?Output 1300 ml  ?Net -460 ml  ? ?Net IO Since Admission: 521.15 mL [12/16/21 0706] ? ?No results for input(s): GLUCAP in the last 72 hours.  ? ?Pertinent Labs: ? ?  Latest Ref Rng & Units 12/16/2021  ?  1:49 AM 12/15/2021  ?  3:34 AM 12/14/2021  ?  3:00 PM  ?CBC  ?WBC 4.0 - 10.5 K/uL 8.0   8.1   8.2    ?Hemoglobin 13.0 - 17.0 g/dL 11.4   11.4   11.5    ?Hematocrit 39.0 - 52.0 % 38.5   38.1   39.2    ?Platelets 150 - 400 K/uL 179   190   198    ? ? ? ?  Latest Ref Rng & Units 12/16/2021  ?  1:49 AM 12/15/2021  ?  3:34 AM 12/14/2021  ?  3:00 PM  ?CMP  ?Glucose 70 - 99 mg/dL 111   115   123    ?BUN 8 - 23 mg/dL '15   17   25    '$ ?Creatinine 0.61 - 1.24 mg/dL 0.41   0.43   0.45    ?Sodium 135 - 145 mmol/L 137   139   141    ?Potassium 3.5 - 5.1 mmol/L 3.8   3.7   3.8    ?Chloride 98 - 111 mmol/L 103   104   105    ?CO2 22 - 32 mmol/L '27   29   28    '$ ?Calcium 8.9 - 10.3 mg/dL 8.9   8.7   8.6    ?Total Protein 6.5 - 8.1 g/dL   6.1    ?Total Bilirubin 0.3 - 1.2 mg/dL   0.8    ?Alkaline Phos 38 - 126 U/L   71    ?AST 15 - 41 U/L   36    ?ALT 0 - 44 U/L   44    ? ? ?Imaging: ?MR FOOT LEFT WO  CONTRAST ? ?Result Date: 12/15/2021 ?CLINICAL DATA:  Foot swelling. Diabetic. Osteomyelitis suspected. Bilateral lower extremity edema. History of left fifth toe amputation secondary to osteomyelitis. Recurrent cellulitis of left foot, worsening. Inability to ambulate due to pain over the last month. Left fifth toe amputated 06/2019. EXAM: MRI OF THE LEFT FOOT WITHOUT CONTRAST TECHNIQUE: Multiplanar, multisequence MR imaging of the left forefoot was performed. No intravenous contrast was administered. The technologist notes the study was initially ordered with and without contrast, however due to patient motion artifact and inability to improve, repeat noncontrast  images were performed but no postcontrast imaging was obtained. COMPARISON:  Left foot radiographs 12/14/2021; MRI left ankle 10/19/2021, MRI left forefoot FINDINGS: Despite efforts by the technologist and patient, moderate to high-grade motion artifact is present on today's exam and could not be eliminated. This reduces exam sensitivity and specificity. Bones/Joint/Cartilage Within the limitations of patient motion artifact postsurgical changes are again seen of amputation of the fifth digit to the level of the proximal metatarsal shaft. Motion artifact markedly limits evaluation however no definite marrow edema is seen within the visualized forefoot. No definite cortical erosion within the limitations of the study. Ligaments The Lisfranc ligament complex is intact. Muscles and Tendons There is high-grade extensor digitorum longus and mild quadratus plantae, abductor hallucis longus, and abductor digiti minimi muscle atrophy and fatty infiltration. Soft tissues There is moderate dorsal midfoot and forefoot soft tissue swelling. This is high-grade at the dorsal lateral aspect of the forefoot, in the region of the fourth and fifth toes. There is decreased T1 and decreased T2 signal scarring within the soft tissues lateral to the mid fourth metatarsal, likely  expected postoperative changes from prior amputation. No walled-off fluid collection is seen. IMPRESSION: 1. Compared to 07/23/2019 MRI, interval amputation of fifth digit to the base of fifth metatarsal. 2. Unfortunately, moderate to high-grade patient motion artifact technically limits this study. Within this limitation no definite marrow edema or cortical erosion is seen to indicate MRI evidence of acute osteomyelitis. 3. Moderate to high-grade dorsal soft tissue swelling greatest at the distal dorsal lateral midfoot in the region of the fourth and fifth rays. No walled-off fluid collection is seen. Electronically Signed   By: Yvonne Kendall M.D.   On: 12/15/2021 12:21   ? ?Physical Exam ? ?General: Pleasant, well-appearing elderly man sitting in chair. No acute distress. 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 Atraumatic. ?CV: RRR. No m/r/g. No LE edema ?Pulmonary: Lungs CTAB. Normal effort. No wheezing or rales. ?Abdominal: Soft, nontender, nondistended. Normal bowel sounds. ?Extremities: Improved LLE erythema, warmth and edema. Moderate left foot tenderness to palpation. Distal pulses 2+. ?Skin: Warm and dry.  Port wine stain on face.  ?Neuro: A&Ox3.  Moves all extremities. Normal sensation to gross touch. ?Psych: Normal mood and affect ? ? ?Assessment/Plan: ?Xavier Turner is a 66 y.o. 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  ? ?Principal Problem: ?  Cellulitis of left foot ?Active Problems: ?  Type 2 diabetes mellitus with hyperlipidemia ([place]) ?  Hypertension associated with diabetes ([place]) ?  Iron deficiency anemia ? ?LLE cellulitis ?Hx bilateral LE edema ?Remains afebrile with no white count.  Cellulitis improving on IV antibiotics with Vanco and cefazolin. Will discontinue on cefazolin and continue on IV Vanc with plan to discharge home on linezolid at discharge for total 10-day course of antibiotics. MRI left foot limited due to high-grade patient motion artifact but does not show any signs of osteomyelitis or fluid collection. Kidney function remained stable. Blood cultures still NGTD. No LTC bed offer for patient to referral has been sent to [facility] area SNF for acute rehab ?--Continue vancomycin ?--Pending SNF placement ?--Trend CBCs, fever curve ?--Continue Lasix 20 mg daily ?  ?Type 2 diabetes mellitus ?  Last HbA1c in 06/2021 of 6.4%. Outpatient regimen of tradjenta 5 mg daily, metformin 1000 mg BID, jardiance 10 mg daily. Blood sugars remain stable between 110s to 120s. ?-Continue home tradjenta 5 mg daily, metformin 1000 mg BID, jardiance 10 mg daily, gabapentin 100 mg BID ?  ?Hyperlipidemia ?Chronic condition currently managed outpatient with rosuvastatin 20 mg daily. ?-Continue home rosuvastatin 20 mg daily. ?  ?History of iron deficiency anemia ?Hemoglobin stable at 11.4. ?-Continue home ferrous sulfate ?-Trend CBC ? ?BPH ?--Continue tamsulosin 0.4 mg daily ? ?Depression ?On Lexapro 10 mg daily. We will need to decrease to 5 mg daily if discharged on linezolid to decrease risk of serotonin syndrome. ?--Continue Lexapro 10 mg daily ? ?Diet: CM ?IVF: None ?VTE: Lovenox ?CODE: Full ? ?Prior to Admission Living Arrangement: Group home ?Anticipated Discharge Location: SNF ?Barriers to Discharge: Pending SNF placement ?Dispo: Anticipated discharge in approximately 1-2 day(s).  ? ?Signed: ?Lacinda Axon, MD ?12/16/2021, 7:06 AM  ?Pager: [phone] ?Internal Medicine Teaching Service ?After 5pm on weekdays and 1pm on weekends: On Call pager: [phone] ? ?

## 2021-12-17 DIAGNOSIS — E1159 Type 2 diabetes mellitus with other circulatory complications: Secondary | ICD-10-CM

## 2021-12-17 DIAGNOSIS — I152 Hypertension secondary to endocrine disorders: Secondary | ICD-10-CM | POA: Diagnosis not present

## 2021-12-17 DIAGNOSIS — L03116 Cellulitis of left lower limb: Secondary | ICD-10-CM | POA: Diagnosis not present

## 2021-12-17 LAB — BASIC METABOLIC PANEL
Anion gap: 5 (ref 5–15)
BUN: 15 mg/dL (ref 8–23)
CO2: 28 mmol/L (ref 22–32)
Calcium: 8.7 mg/dL — ABNORMAL LOW (ref 8.9–10.3)
Chloride: 104 mmol/L (ref 98–111)
Creatinine, Ser: 0.51 mg/dL — ABNORMAL LOW (ref 0.61–1.24)
GFR, Estimated: >60 mL/min (ref >60)
Glucose, Bld: 130 mg/dL — ABNORMAL HIGH (ref 70–99)
Potassium: 3.4 mmol/L — ABNORMAL LOW (ref 3.5–5.1)
Sodium: 137 mmol/L (ref 135–145)

## 2021-12-17 LAB — CBC
HCT: 39.1 % (ref 39.0–52.0)
Hemoglobin: 11.6 g/dL — ABNORMAL LOW (ref 13.0–17.0)
MCH: 23.4 pg — ABNORMAL LOW (ref 26.0–34.0)
MCHC: 29.7 g/dL — ABNORMAL LOW (ref 30.0–36.0)
MCV: 79 fL — ABNORMAL LOW (ref 80.0–100.0)
Platelets: 167 10*3/uL (ref 150–400)
RBC: 4.95 MIL/uL (ref 4.22–5.81)
RDW: 18.3 % — ABNORMAL HIGH (ref 11.5–15.5)
WBC: 10.3 10*3/uL (ref 4.0–10.5)
nRBC: 0 % (ref 0.0–0.2)

## 2021-12-17 NOTE — Progress Notes (Signed)
? ?Subjective:  ? ?Hospital day: 3 ? ?Overnight event: No acute events overnight ? ?Interim history: Patient evaluated at the bedside sitting comfortably in chair. States he is feeling well.  Denies any fevers, chills, shortness of breath or pain. Eating and drinking well. Last BM yesterday.  ? ?Objective: ? ?Vital signs in last 24 hours: ?Vitals:  ? 12/16/21 0725 12/16/21 1526 12/16/21 2047 12/17/21 0537  ?BP: 123/65 116/67 (!) 115/57 131/78  ?Pulse: 60 68 71 85  ?Resp: 16 16    ?Temp: 98.1 ?F (36.7 ?C) (!) 97.3 ?F (36.3 ?C) 98.2 ?F (36.8 ?C) 97.6 ?F (36.4 ?C)  ?TempSrc: Oral Oral  Oral  ?SpO2: 96% 97% 96% 94%  ?Weight:      ? ? ?Filed Weights  ? 12/15/21 0818  ?Weight: 88.5 kg  ? ? ? ?Intake/Output Summary (Last 24 hours) at 12/17/2021 0643 ?Last data filed at 12/17/2021 0141 ?Gross per 24 hour  ?Intake 540 ml  ?Output 800 ml  ?Net -260 ml  ? ?Net IO Since Admission: 261.15 mL [12/17/21 0643] ? ?No results for input(s): GLUCAP in the last 72 hours.  ? ?Pertinent Labs: ? ?  Latest Ref Rng & Units 12/17/2021  ?  4:55 AM 12/16/2021  ?  1:49 AM 12/15/2021  ?  3:34 AM  ?CBC  ?WBC 4.0 - 10.5 K/uL 10.3   8.0   8.1    ?Hemoglobin 13.0 - 17.0 g/dL 11.6   11.4   11.4    ?Hematocrit 39.0 - 52.0 % 39.1   38.5   38.1    ?Platelets 150 - 400 K/uL 167   179   190    ? ? ? ?  Latest Ref Rng & Units 12/17/2021  ?  4:55 AM 12/16/2021  ?  1:49 AM 12/15/2021  ?  3:34 AM  ?CMP  ?Glucose 70 - 99 mg/dL 130   111   115    ?BUN 8 - 23 mg/dL '15   15   17    '$ ?Creatinine 0.61 - 1.24 mg/dL 0.51   0.41   0.43    ?Sodium 135 - 145 mmol/L 137   137   139    ?Potassium 3.5 - 5.1 mmol/L 3.4   3.8   3.7    ?Chloride 98 - 111 mmol/L 104   103   104    ?CO2 22 - 32 mmol/L '28   27   29    '$ ?Calcium 8.9 - 10.3 mg/dL 8.7   8.9   8.7    ? ? ?Imaging: ?No results found. ? ?Physical Exam ? ?General: Pleasant, well-appearing elderly man sitting in chair. No acute distress. 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 Atraumatic. ?CV: RRR. No m/r/g. No LE  edema ?Pulmonary: Lungs CTAB. Normal effort. No wheezing or rales. ?Abdominal: Soft, nontender, nondistended. Normal bowel sounds. ?Extremities: Improved LLE erythema, warmth and edema. Mild left foot tenderness. 2+ distal pulses. ?Skin: Warm and dry. Port wine stain on face.  ?Neuro: A&Ox3. Moves all extremities. Normal sensation to gross touch. ?Psych: Normal mood and affect ? ? ?Assessment/Plan: ?Xavier Turner is a 66 y.o. 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  ? ?  Principal Problem: ?  Cellulitis of left foot ?Active Problems: ?  Type 2 diabetes mellitus with hyperlipidemia ([place]) ?  Hypertension associated with diabetes ([place]) ?  Iron deficiency anemia ? ?LLE cellulitis ?Hx bilateral LE edema ?Patient continues to be afebrile and hemodynamically stable.  Left lower extremity cellulitis continues to improve with decreased warmth, erythema and edema. Pain is minimal. Patient has no acute complaints. ?--Continue vancomycin (Day 3) ?--Pending SNF placement ?--Trend CBCs, fever curve ?--Continue Lasix 20 mg daily ?  ?Type 2 diabetes mellitus ?Last HbA1c in 06/2021 of 6.4%. Outpatient regimen of tradjenta 5 mg daily, metformin 1000 mg BID, jardiance 10 mg daily.  Blood sugars in the 110s to 130s. ?-Continue home tradjenta 5 mg daily, metformin 1000 mg BID, jardiance 10 mg daily, gabapentin 100 mg BID ?  ?Hyperlipidemia ?Chronic condition currently managed outpatient with rosuvastatin 20 mg daily. ?-Continue home rosuvastatin 20 mg daily. ?  ?History of iron deficiency anemia ?Hemoglobin stable at 11.6. ?-Continue home ferrous sulfate ?-Trend CBC ? ?BPH ?--Continue tamsulosin 0.4 mg daily ? ?Depression ?On Lexapro 10 mg  daily. We will need to decrease to 5 mg daily if discharged on linezolid to decrease risk of serotonin syndrome. ?--Continue Lexapro 10 mg daily ? ?Diet: CM ?IVF: None ?VTE: Lovenox ?CODE: Full ? ?Prior to Admission Living Arrangement: Group home ?Anticipated Discharge Location: SNF ?Barriers to Discharge: Pending SNF placement ?Dispo: Anticipated discharge in approximately 1-2 day(s).  ? ?Signed: ?Lacinda Axon, MD ?12/17/2021, 6:43 AM  ?Pager: [phone] ?Internal Medicine Teaching Service ?After 5pm on weekdays and 1pm on weekends: On Call pager: [phone] ? ?

## 2021-12-17 NOTE — TOC Progression Note (Addendum)
Transition of Care (TOC) - Progression Note  ? ? ?Patient Details  ?Name: Xavier Turner ?MRN: [id number] ?Date of Birth: Mar 14, 1956 ? ?Transition of Care (TOC) CM/SW Contact  ?Joanne Chars, LCSW ?Phone Number: ?12/17/2021, 11:09 AM ? ?Clinical Narrative:   CSW spoke with pt legal guardian/sister Shelly Rubenstein and provided bed offers.  She would like to choose [facility].  CSW will reach out to [facility] to confirm that they can accept pt.   ? ?1120: TC [facility], after reviewing chart, pt being from a group home could be a rule out for them.  She asked for additional information which CSW obtained from [facility]: pt has intellectual disability, functions at about a 66 year old level, has always had guardian and lived with parents until they were too old to care for him.  No behavioral issues.  Has been at current group home x15 years.  Since his toe was amputated, he has had ongoing problems with the foot which has led to him now being in a wheelchair.  Group home cannot manage with the wheelchair long term or he would stay there.  This information was passed on to [facility], who will consult with admin tomorrow. ? ?Expected Discharge Plan: [place] ?Barriers to Discharge: SNF Pending bed offer ? ?Expected Discharge Plan and Services ?Expected Discharge Plan: [place] ?In-house Referral: Clinical Social Work ?  ?Post Acute Care Choice: [place] ?Living arrangements for the past 2 months: Group Home ?                ?  ?  ?  ?  ?  ?  ?  ?  ?  ?  ? ? ?Social Determinants of Health (SDOH) Interventions ?  ? ?Readmission Risk Interventions ? ?  07/22/2019  ? 12:27 PM  ?Readmission Risk Prevention Plan  ?Post Dischage Appt Not Complete  ?Appt Comments further work up SNF vs return to group home  ?Medication Screening Complete  ?Transportation Screening Complete  ? ? ?

## 2021-12-18 ENCOUNTER — Other Ambulatory Visit (HOSPITAL_COMMUNITY): Payer: Self-pay

## 2021-12-18 LAB — BASIC METABOLIC PANEL
Anion gap: 4 — ABNORMAL LOW (ref 5–15)
BUN: 23 mg/dL (ref 8–23)
CO2: 27 mmol/L (ref 22–32)
Calcium: 8.5 mg/dL — ABNORMAL LOW (ref 8.9–10.3)
Chloride: 106 mmol/L (ref 98–111)
Creatinine, Ser: 0.46 mg/dL — ABNORMAL LOW (ref 0.61–1.24)
GFR, Estimated: >60 mL/min (ref >60)
Glucose, Bld: 105 mg/dL — ABNORMAL HIGH (ref 70–99)
Potassium: 3.6 mmol/L (ref 3.5–5.1)
Sodium: 137 mmol/L (ref 135–145)

## 2021-12-18 LAB — CBC
HCT: 38.6 % — ABNORMAL LOW (ref 39.0–52.0)
Hemoglobin: 11.4 g/dL — ABNORMAL LOW (ref 13.0–17.0)
MCH: 23.3 pg — ABNORMAL LOW (ref 26.0–34.0)
MCHC: 29.5 g/dL — ABNORMAL LOW (ref 30.0–36.0)
MCV: 78.9 fL — ABNORMAL LOW (ref 80.0–100.0)
Platelets: 170 10*3/uL (ref 150–400)
RBC: 4.89 MIL/uL (ref 4.22–5.81)
RDW: 18.6 % — ABNORMAL HIGH (ref 11.5–15.5)
WBC: 8 10*3/uL (ref 4.0–10.5)
nRBC: 0 % (ref 0.0–0.2)

## 2021-12-18 MED ORDER — LINEZOLID 600 MG PO TABS: 600.0000 mg | ORAL_TABLET | Freq: Two times a day (BID) | ORAL | 0 refills | Status: DC

## 2021-12-18 MED ORDER — LINEZOLID 600 MG PO TABS
600.0000 mg | ORAL_TABLET | Freq: Two times a day (BID) | ORAL | 0 refills | Status: DC
  Filled 2021-12-18: qty 21, 11d supply, fill #0

## 2021-12-18 NOTE — Progress Notes (Signed)
Physical Therapy Treatment ?Patient Details ?Name: Xavier Turner ?MRN: [id number] ?DOB: Jan 05, 1956 ?Today's Date: 12/18/2021 ? ? ?History of Present Illness Pt presented to ED on 3/23 from group home with worsening left foot pain due to cellulitis. 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 ? ?  ?PT Comments  ? ? Pt making good progress towards goals this session with increased tolerance for ambulation, needing min guard throughout for transfers and ambulation for steadying as pt demonstrating mild unsteadiness when coming to standing and during static standing. With task practice pt able to come to standing without steadying assist from recliner x3. Pt continues to benefit from skilled PT services to progress toward functional mobility goals.  ?  ?Recommendations for follow up therapy are one component of a multi-disciplinary discharge planning process, led by the attending physician.  Recommendations may be updated based on patient status, additional functional criteria and insurance authorization. ? ?Follow Up Recommendations ? Skilled nursing-short term rehab (<3 hours/day) ?  ?  ?Assistance Recommended at Discharge Frequent or constant Supervision/Assistance  ?Patient can return home with the following A little help with walking and/or transfers;A little help with bathing/dressing/bathroom ?  ?Equipment Recommendations ? None recommended by PT  ?  ?Recommendations for Other Services   ? ? ?  ?Precautions / Restrictions Precautions ?Precautions: Fall ?Required Braces or Orthoses: Other Brace ?Other Brace: Pt using lt foot boot/splint for amb ?Restrictions ?Weight Bearing Restrictions: No  ?  ? ?Mobility ? Bed Mobility ?  ?  ?  ?  ?  ?  ?  ?General bed mobility comments: pt OOB in recliner on arrival ?  ? ?Transfers ?Overall transfer level: Needs assistance ?Equipment used: Straight cane ?Transfers: Sit to/from Stand ?Sit to  Stand: Min assist, Min guard ?  ?  ?  ?  ?  ?General transfer comment: cues for hand placement, assist to steady on rise. much improvement with stability with task practice ?  ? ?Ambulation/Gait ?Ambulation/Gait assistance: Min assist ?Gait Distance (Feet): 120 Feet ?Assistive device: Straight cane ?Gait Pattern/deviations: Step-through pattern, Decreased stride length, Shuffle ?Gait velocity: decreased ?  ?  ?General Gait Details: Ambulated with PRAFO on L foot and tends to internally rotate; min A to steady ? ? ?Stairs ?  ?  ?  ?  ?  ? ? ?Wheelchair Mobility ?  ? ?Modified Rankin (Stroke Patients Only) ?  ? ? ?  ?Balance Overall balance assessment: Needs assistance ?Sitting-balance support: No upper extremity supported, Feet supported ?Sitting balance-Leahy Scale: Fair ?  ?  ?Standing balance support: Single extremity supported ?Standing balance-Leahy Scale: Poor ?Standing balance comment: cane and min guard for static standing ?  ?  ?  ?  ?  ?  ?  ?  ?  ?  ?  ?  ? ?  ?Cognition Arousal/Alertness: Awake/alert ?Behavior During Therapy: [facility] for tasks assessed/performed ?Overall Cognitive Status: History of cognitive impairments - at baseline ?  ?  ?  ?  ?  ?  ?  ?  ?  ?  ?  ?  ?  ?  ?  ?  ?  ?  ?  ? ?  ?Exercises   ? ?  ?General Comments   ?  ?  ? ?Pertinent Vitals/Pain    ? ? ?Home Living   ?  ?  ?  ?  ?  ?  ?  ?  ?  ?   ?  ?  Prior Function    ?  ?  ?   ? ?PT Goals (current goals can now be found in the care plan section) Acute Rehab PT Goals ?PT Goal Formulation: With patient ?Time For Goal Achievement: 12/28/21 ?Potential to Achieve Goals: Good ? ?  ?Frequency ? ? ? Min 2X/week ? ? ? ?  ?PT Plan Current plan remains appropriate  ? ? ?Co-evaluation   ?  ?  ?  ?  ? ?  ?AM-PAC PT "6 Clicks" Mobility   ?Outcome Measure ? Help needed turning from your back to your side while in a flat bed without using bedrails?: A Little ?Help needed moving from lying on your back to sitting on the side of a flat bed without using  bedrails?: A Little ?Help needed moving to and from a bed to a chair (including a wheelchair)?: A Little ?Help needed standing up from a chair using your arms (e.g., wheelchair or bedside chair)?: A Little ?Help needed to walk in hospital room?: A Little ?Help needed climbing 3-5 steps with a railing? : Total ?6 Click Score: 16 ? ?  ?End of Session Equipment Utilized During Treatment: Gait belt;Other (comment) (L PRAFO, Pt R shoe) ?Activity Tolerance: Patient tolerated treatment well ?Patient left: in chair;with call bell/phone within reach;with chair alarm set ?Nurse Communication: Mobility status ?PT Visit Diagnosis: Other abnormalities of gait and mobility (R26.89);Pain ?Pain - Right/Left: Left ?Pain - part of body: Ankle and joints of foot ?  ? ? ?Time: 4132-4401 ?PT Time Calculation (min) (ACUTE ONLY): 20 min ? ?Charges:  $Gait Training: 8-22 mins          ?          ? ?Audry Riles. PTA ?Acute Rehabilitation Services ?Office: [phone] ? ? ? ?Betsey Holiday Yexalen Deike ?12/18/2021, 10:48 AM ? ?

## 2021-12-18 NOTE — Discharge Summary (Signed)
? ?Name: Xavier Turner ?MRN: [id number] ?DOB: 1955/12/16 66 y.o. ?PCP: Vicenta Aly, [place] ? ?Date of Admission: 12/14/2021 10:22 AM ?Date of Discharge:  12/18/2021 ?Attending Physician: Dr. Angelia Mould ? ?DISCHARGE DIAGNOSIS:  ?Primary Problem: Cellulitis of left foot  ? ?Hospital Problems: ?Principal Problem: ?  Cellulitis of left foot ?Active Problems: ?  Type 2 diabetes mellitus with hyperlipidemia ([place]) ?  Hypertension associated with diabetes ([place]) ?  Iron deficiency anemia ?  ? ?DISCHARGE MEDICATIONS:  ? ?Allergies as of 12/18/2021   ?No Known Allergies ?  ? ?  ?Medication List  ?  ? ?STOP taking these medications   ? ?escitalopram 10 MG tablet ?Commonly known as: LEXAPRO ?  ? ?  ? ?TAKE these medications   ? ?aspirin 81 MG chewable tablet ?Chew 81 mg by mouth daily. ?  ?brimonidine 0.2 % ophthalmic solution ?Commonly known as: ALPHAGAN ?Place 1 drop into both eyes 3 (three) times daily. ?  ?carboxymethylcellulose 0.5 % Soln ?Commonly known as: REFRESH PLUS ?Place 1 drop into both eyes 4 (four) times daily as needed (dry eyes). ?  ?cholecalciferol 25 MCG (1000 UNIT) tablet ?Commonly known as: VITAMIN D3 ?Take 1 tablet (1,000 Units total) by mouth daily. ?What changed: how much to take ?  ?dorzolamide-timolol 22.3-6.8 MG/ML ophthalmic solution ?Commonly known as: COSOPT ?Place 1 drop into the right eye 2 (two) times daily. ?What changed:  ?how to take this ?when to take this ?  ?ferrous sulfate 325 (65 FE) MG tablet ?Take 325 mg by mouth daily with breakfast. ?  ?furosemide 20 MG tablet ?Commonly known as: LASIX ?Take 20 mg by mouth daily. ?  ?gabapentin 100 MG capsule ?Commonly known as: NEURONTIN ?Take 1 capsule (100 mg total) by mouth 2 (two) times daily. ?  ?hydrochlorothiazide 25 MG tablet ?Commonly known as: HYDRODIURIL ?Take 1 tablet (25 mg total) by mouth daily. ?  ?hydrOXYzine 10 MG tablet ?Commonly known as: ATARAX ?Take 5 mg by mouth at bedtime as needed for itching. ?  ?Jardiance 10 MG Tabs  tablet ?Generic drug: empagliflozin ?Take 10 mg by mouth daily. ?  ?lanolin-mineral oil Oil ?1 drop See admin instructions. Instill 1 drop into each ear canal 2 times a week ?  ?linezolid 600 MG tablet ?Commonly known as: ZYVOX ?Take 1 tablet (600 mg total) by mouth 2 (two) times daily. ?  ?meloxicam 15 MG tablet ?Commonly known as: MOBIC ?Take 1 tablet (15 mg total) by mouth daily. ?  ?metFORMIN 1000 MG tablet ?Commonly known as: GLUCOPHAGE ?Take 1 tablet (1,000 mg total) by mouth 2 (two) times daily with a meal. ?  ?Minerin Creme Crea ?Apply 1 application. topically daily as needed (affected areas- for dry skin). ?  ?Misc. Devices Misc ?Please supply patient with shower chair with U-shaped seat. ?  ?Multivitamin Tabs ?Take 1 tablet by mouth daily with breakfast. ?  ?nystatin powder ?Commonly known as: MYCOSTATIN/NYSTOP ?Apply 1 application. topically daily as needed (for irritation- groin, belly "fold," and underarms). ?  ?nystatin cream ?Commonly known as: MYCOSTATIN ?Apply 1 application. topically 2 (two) times daily as needed (to affected areas- for recurring flares). ?  ?omeprazole 20 MG capsule ?Commonly known as: PRILOSEC ?Take 1 capsule (20 mg total) by mouth daily. ?What changed:  ?how much to take ?when to take this ?  ?Rhopressa 0.02 % Soln ?Generic drug: Netarsudil Dimesylate ?Place 1 drop into both eyes every evening. ?  ?Rocklatan 0.02-0.005 % Soln ?Generic drug: Netarsudil-Latanoprost ?Place 1 drop into both eyes  at bedtime. ?  ?rosuvastatin 20 MG tablet ?Commonly known as: CRESTOR ?Take 1 tablet (20 mg total) by mouth daily. ?What changed: when to take this ?  ?sitaGLIPtin 100 MG tablet ?Commonly known as: JANUVIA ?Take 1 tablet (100 mg total) by mouth daily. ?  ?tamsulosin 0.4 MG Caps capsule ?Commonly known as: FLOMAX ?Take 1 capsule (0.4 mg total) by mouth daily. ?  ?urea 40 % Crea ?Commonly known as: CARMOL ?Apply 1 application topically daily. To calluses on feet ?What changed:  ?when to take  this ?additional instructions ?  ? ?  ? ? ?DISPOSITION AND FOLLOW-UP:  ?Xavier Turner was discharged from [facility] in Stable condition. At the hospital follow up visit please address: ? ?Non-purulent LLE cellulitis without SIRS criteria ?Hx bilateral LE edema ?Monitor for signs of recurrent cellulitis to L foot and lower extremity. Ensure patient completes full course of antibiotics. Recheck CBC. ?  ?Hyperlipidemia ?Consider adjusting regimen given hyperlipidemia on recent outpatient labs. ? ?Hx depression ?Home Lexapro held at discharge due to increased risk of serotonin syndrome when taken with Linezolid. Restart as indicated. ? ?Follow-up Recommendations: ?Consults: None ?Labs: CBC ?Studies: None ?Medications:  ? ?START these medications: ?Linezolid 600 mg BID. Patient will have one dose to take evening of 03/27, then will have BID dosing for 10 more days. ?STOP taking these medications: ?Lexapro 10 mg daily: do not take this medication while taking Linezolid. ? ?Follow-up Appointments: ? Contact information for follow-up providers   ? ? [facility] Aly, [place]. Schedule an appointment as soon as possible for a visit in 1 week(s).   ?Specialty: Nurse Practitioner ?Why: For hospital follow-up visit. ?Contact information: ?[address] ?[address] ?[place] [address] ?[phone] ? ? ?  ?  ? ?  ?  ? ? Contact information for after-discharge care   ? ? Destination   ? ? [facility] .   ?Service: Skilled Nursing ?Contact information: ?[address] ?[place] ?[phone] ? ?  ?  ? ?  ?  ? ?  ?  ? ?  ? ? ?HOSPITAL COURSE:  ?Patient Summary: ?Non-purulent LLE cellulitis without SIRS criteria ?Hx bilateral LE edema ?Patient presented with prolonged history of L foot pain that had worsened to the point of causing difficulty with ambulation over the previous few weeks. At presentation he was hemodynamically stable  without leukocytosis or fever but did have bump in lactic acid from 1.5>2.3. Left foot x-ray showed moderate to high-grade soft tissue swelling of the forefoot, no definite acute fracture or cortical erosion seen to indicate radiographic evidence of acute osteomyelitis. Home lasix was continued and he received gentle hydration with IVF. He was started on cefazolin at admission and vancomycin was started on HD1. Blood cultures collected at admission showed no growth to date. Cefazolin was ultimately discontinued. MRI left foot was obtained but limited due to high-grade patient motion artifact; it did not show any signs of osteomyelitis or fluid collection. At time of discharge he remained hemodynamically stable without fever or leukocytosis and overall clinical improvement of the LLE and foot. ?  ?Type 2 diabetes mellitus ?Last HbA1c in 06/2021 of 6.4%. Outpatient regimen of tradjenta 5 mg daily, metformin 1000 mg BID, jardiance 10 mg daily. ?-Continue home tradjenta 5 mg daily, metformin 1000 mg BID, jardiance 10 mg daily. ?  ?Hyperlipidemia ?Chronic condition. Continued home rosuvastatin 20 mg daily. ?  ?History of iron deficiency anemia ?  Admission labs reveal microcytic anemia which was comparable to baseline labs in Care Everywhere. He was hemodynamically stable without signs of acute bleed at this time. Home ferrous sulfate was continued. CBC was stable throughout admission. ? ?Hx BPH ?Chronic condition. Continued home tamsulosin. ? ?Hx depression ?Chronic condition. Continued home lexapro.  ? ?DISCHARGE INSTRUCTIONS:  ? ?Discharge Instructions   ? ? Call MD for:  redness, tenderness, or signs of infection (pain, swelling, redness, odor or green/yellow discharge around incision site)   Complete by: As directed ?  ? Call MD for:  temperature >100.4   Complete by: As directed ?  ? Diet - low sodium heart healthy   Complete by: As directed ?  ? Discharge instructions   Complete by: As directed ?  ? Xavier Turner, ? ?It was a pleasure to care for you during your stay at [facility]. I am glad that your foot is feeling much better and that the signs of infection when you first came to the hospital have improved. ? ?When yo

## 2021-12-18 NOTE — TOC Benefit Eligibility Note (Signed)
Patient Advocate Encounter ?  ?Received notification that prior authorization for linezolid (Zyvox) 600 mg tablets is required. ?  ?PA submitted on 12/18/2021 ?Key [id number] ?Status is pending ?   ? ? ? ?Lyndel Safe, CPhT ?Pharmacy Patient Advocate Specialist ?[facility] Patient Advocate Team ?Direct Number: [phone]  Fax: [phone]  ?

## 2021-12-18 NOTE — TOC Progression Note (Signed)
Transition of Care (TOC) - Progression Note  ? ? ?Patient Details  ?Name: WAEL MAESTAS ?MRN: [id number] ?Date of Birth: May 26, 1956 ? ?Transition of Care (TOC) CM/SW Contact  ?Joanne Chars, LCSW ?Phone Number: ?12/18/2021, 11:17 AM ? ?Clinical Narrative:   Kitty at [facility] still waiting on response from administration.  CSW spoke with pt sister Jeannene Patella regarding this and other bed offers.  She is interested in [facility] as alternative.  CSW spoke with Elyse Hsu at [facility] who can accept pt, cannot keep as LTC resident, CSW confirmed that pt can return to group home after rehab.  CSW informed [name] and care coordinator [place] of plan to go to [facility].  MD also informed. ? ? ? ?Expected Discharge Plan: [place] ?Barriers to Discharge: SNF Pending bed offer ? ?Expected Discharge Plan and Services ?Expected Discharge Plan: [place] ?In-house Referral: Clinical Social Work ?  ?Post Acute Care Choice: [place] ?Living arrangements for the past 2 months: Group Home ?                ?  ?  ?  ?  ?  ?  ?  ?  ?  ?  ? ? ?Social Determinants of Health (SDOH) Interventions ?  ? ?Readmission Risk Interventions ? ?  07/22/2019  ? 12:27 PM  ?Readmission Risk Prevention Plan  ?Post Dischage Appt Not Complete  ?Appt Comments further work up SNF vs return to group home  ?Medication Screening Complete  ?Transportation Screening Complete  ? ? ?

## 2021-12-18 NOTE — TOC Benefit Eligibility Note (Signed)
Patient Advocate Encounter ? ?Prior Authorization for linezolid (Zyvox) 600 mg tablets has been approved.   ? ?PA# [id number] ?Effective dates: 12/18/2021 through 01/15/2022 ? ? ? ? ? ?Lyndel Safe, CPhT ?Pharmacy Patient Advocate Specialist ?[facility] Patient Advocate Team ?Direct Number: [phone]  Fax: [phone]  ?

## 2021-12-18 NOTE — Progress Notes (Signed)
Pt is for DC now, called the facility and report was given to [employer]. Pending PTAR for transportation. ?

## 2021-12-18 NOTE — Care Management Important Message (Signed)
Important Message ? ?Patient Details  ?Name: Xavier Turner ?MRN: [id number] ?Date of Birth: [birth year] ? ? ?Medicare Important Message Given:  Yes ? ? ? ? ?Levada Dy  Areya Lemmerman-Stangler ?12/18/2021, 2:51 PM ?

## 2021-12-18 NOTE — TOC Transition Note (Signed)
Transition of Care (TOC) - CM/SW Discharge Note ? ? ?Patient Details  ?Name: Xavier Turner ?MRN: [id number] ?Date of Birth: 06-29-1956 ? ?Transition of Care [facility]) CM/SW Contact:  ?Joanne Chars, LCSW ?Phone Number: ?12/18/2021, 11:54 AM ? ? ?Clinical Narrative:   Pt discharging to [facility].  RN call [phone] for report.  Sister/legal guardian [name] informed.   ? ? ? ?Final next level of care: [facility] ?Barriers to Discharge: Barriers Resolved ? ? ?Patient Goals and CMS Choice ?  ?  ?  ? ?Discharge Placement ?  ?           ?Patient chooses bed at:  [facility]) ?Patient to be transferred to facility by: PTAR ?Name of family member notified: legalguardian/sister [name] ?Patient and family notified of of transfer: 12/18/21 ? ?Discharge Plan and Services ?In-house Referral: Clinical Social Work ?  ?Post Acute Care Choice: [place]          ?  ?  ?  ?  ?  ?  ?  ?  ?  ?  ? ?Social Determinants of Health (SDOH) Interventions ?  ? ? ?Readmission Risk Interventions ? ?  07/22/2019  ? 12:27 PM  ?Readmission Risk Prevention Plan  ?Post Dischage Appt Not Complete  ?Appt Comments further work up SNF vs return to group home  ?Medication Screening Complete  ?Transportation Screening Complete  ? ? ? ? ? ?

## 2021-12-19 LAB — CULTURE, BLOOD (ROUTINE X 2)
Culture: NO GROWTH
Culture: NO GROWTH
Special Requests: ADEQUATE
Special Requests: ADEQUATE

## 2021-12-20 ENCOUNTER — Other Ambulatory Visit (HOSPITAL_COMMUNITY): Payer: Self-pay

## 2021-12-21 ENCOUNTER — Ambulatory Visit: Payer: Medicare Other | Admitting: Podiatry

## 2021-12-26 ENCOUNTER — Ambulatory Visit (INDEPENDENT_AMBULATORY_CARE_PROVIDER_SITE_OTHER): Payer: Medicare Other | Admitting: Podiatry

## 2021-12-26 ENCOUNTER — Telehealth: Payer: Self-pay | Admitting: Podiatry

## 2021-12-26 ENCOUNTER — Other Ambulatory Visit (HOSPITAL_COMMUNITY): Payer: Self-pay

## 2021-12-26 DIAGNOSIS — M216X9 Other acquired deformities of unspecified foot: Secondary | ICD-10-CM

## 2021-12-26 DIAGNOSIS — M7752 Other enthesopathy of left foot: Secondary | ICD-10-CM

## 2021-12-26 DIAGNOSIS — R2242 Localized swelling, mass and lump, left lower limb: Secondary | ICD-10-CM

## 2021-12-26 DIAGNOSIS — E1169 Type 2 diabetes mellitus with other specified complication: Secondary | ICD-10-CM

## 2021-12-26 DIAGNOSIS — E785 Hyperlipidemia, unspecified: Secondary | ICD-10-CM

## 2021-12-26 NOTE — Patient Instructions (Addendum)
At next visit, bring your diabetic shoes, inserts, and all braces you have  ?

## 2021-12-26 NOTE — Telephone Encounter (Signed)
Patients retirement home ([facility]) called and needed notes about his visit today. They sent over a package that needs to be completed after every visit, with weightbearing status or any updated information. They said it came back empty. I told her that I will send office note over once its completed to fax # [phone]. ?

## 2021-12-26 NOTE — Telephone Encounter (Signed)
The packet they sent today had no form attached with it today, there was no place for me to sign or fill out (i've done these before for him and the usual form was not attached). I asked the front desk to attach his AVS but I don't know if they did

## 2021-12-26 NOTE — Telephone Encounter (Signed)
Yes, I asked her if someone was with the patient, she said yes. So I stated, "in the future if there is something that needs to be completed then his companion would need to make the Doctor aware. I think the front sent the AVS but the [facility] said, there was nothing on it. I will send it over.. Thank you ?

## 2021-12-30 NOTE — Progress Notes (Signed)
?  Subjective:  ?Patient ID: Xavier Turner, male    DOB: 1956-01-05,  MRN: [id number] ? ?Chief Complaint  ?Patient presents with  ? Diabetes  ?  At risk diabetic foot care  ? ? ?66 y.o. male returns with the above complaint. History confirmed with patient.  Foot and ankle continues to be quite swollen mostly plantar ? ?Objective:  ?Physical Exam: ?warm, good capillary refill, no trophic changes or ulcerative lesions, normal DP and PT pulses, and abnormal sensory exam.  He has severe pes cavus foot deformity.  Prominent area of tenderness over the fifth metatarsal base plantarly. Thickened elongated nails with yellow discoloration and subungual debris x10 ? ?Summary:  ?RIGHT:  ?- No evidence of common femoral vein obstruction.  ?   ?LEFT:  ?- There is no evidence of deep vein thrombosis in the lower extremity.  ?- There is no evidence of superficial venous thrombosis.  ?   ?- No cystic structure found in the popliteal fossa.  ?   ? ?*See table(s) above for measurements and observations.  ? ?Electronically signed by Orlie Pollen on 10/06/2021 at 4:20:40 PM.  ? ?Lab work was unrevealing for gout or infectious process ?Assessment:  ? ?1. Bursitis of left foot   ?2. Localized swelling of left lower leg   ?3. Cavus foot, acquired   ?4. Type 2 diabetes mellitus with hyperlipidemia ([place])   ? ? ? ? ? ?Plan:  ?Patient was evaluated and treated and all questions answered. ? ?Continues to be quite painful despite injection at last visit.  I think most of this is mechanical and due to his significant cavovarus deformity of his foot.  We discussed correction of this surgically with a tendon transfer but he is not interested in this and this could be quite risky for him.  I would like to see what type of bracing and diabetic shoes that he currently has.  I advised him to bring these to his next visit.  At next visit he will be seen by orthotist as well and we will decide if there is any type of bracing that may alleviate this. ? ?No  follow-ups on file.  ? ? ?

## 2022-01-22 ENCOUNTER — Telehealth: Payer: Self-pay | Admitting: Podiatry

## 2022-01-22 NOTE — Telephone Encounter (Signed)
Pts sister called to cxl pts appt for tomorrow as he is now in a nursing facility and they have transportation but it is very expensive. Pt has medicaid so I recommended she call medicaid to see if they would be able to provide transportation and call us back if they will and we will reschedule. ?

## 2022-01-23 ENCOUNTER — Ambulatory Visit (INDEPENDENT_AMBULATORY_CARE_PROVIDER_SITE_OTHER): Payer: Medicare Other | Admitting: Podiatry

## 2022-01-23 ENCOUNTER — Ambulatory Visit: Payer: Medicare Other | Admitting: Podiatry

## 2022-01-23 ENCOUNTER — Encounter: Payer: Self-pay | Admitting: Podiatry

## 2022-01-23 DIAGNOSIS — M79674 Pain in right toe(s): Secondary | ICD-10-CM | POA: Diagnosis not present

## 2022-01-23 DIAGNOSIS — E785 Hyperlipidemia, unspecified: Secondary | ICD-10-CM

## 2022-01-23 DIAGNOSIS — B351 Tinea unguium: Secondary | ICD-10-CM | POA: Diagnosis not present

## 2022-01-23 DIAGNOSIS — M79675 Pain in left toe(s): Secondary | ICD-10-CM | POA: Diagnosis not present

## 2022-01-23 DIAGNOSIS — E1169 Type 2 diabetes mellitus with other specified complication: Secondary | ICD-10-CM

## 2022-01-23 NOTE — Telephone Encounter (Signed)
Okay thank you

## 2022-01-28 ENCOUNTER — Encounter: Payer: Self-pay | Admitting: Podiatry

## 2022-01-28 NOTE — Progress Notes (Signed)
?  Subjective:  ?Patient ID: Xavier Turner, male    DOB: 1955-10-21,  MRN: [id number] ? ?Chief Complaint  ?Patient presents with  ? cellulitis  ?  6 week f/u for recheck cellulitis  ? nail trim  ? ? ?66 y.o. male returns with the above complaint. History confirmed with patient.  Here today for at risk diabetic foot care his nails are thickened elongated and causing pain again. ? ?Objective:  ?Physical Exam: ?warm, good capillary refill, no trophic changes or ulcerative lesions, normal DP and PT pulses, and abnormal sensory exam.  He has severe pes cavus foot deformity.  Prominent area of tenderness over the fifth metatarsal base plantarly. Thickened elongated nails with yellow discoloration and subungual debris x10 ? ?Summary:  ?RIGHT:  ?- No evidence of common femoral vein obstruction.  ?   ?LEFT:  ?- There is no evidence of deep vein thrombosis in the lower extremity.  ?- There is no evidence of superficial venous thrombosis.  ?   ?- No cystic structure found in the popliteal fossa.  ?   ? ?*See table(s) above for measurements and observations.  ? ?Electronically signed by Orlie Pollen on 10/06/2021 at 4:20:40 PM.  ? ?Lab work was unrevealing for gout or infectious process ?Assessment:  ? ?1. Pain due to onychomycosis of toenails of both feet   ?2. Type 2 diabetes mellitus with hyperlipidemia ([place])   ? ? ? ? ? ?Plan:  ?Patient was evaluated and treated and all questions answered. ? ?Patient educated on diabetes. Discussed proper diabetic foot care and discussed risks and complications of disease. Educated patient in depth on reasons to return to the office immediately should he/she discover anything concerning or new on the feet. All questions answered. Discussed proper shoes as well.  ? ?Discussed the etiology and treatment options for the condition in detail with the patient. Educated patient on the topical and oral treatment options for mycotic nails. Recommended debridement of the nails today. Sharp and  mechanical debridement performed of all painful and mycotic nails today. Nails debrided in length and thickness using a nail nipper to level of comfort. Discussed treatment options including appropriate shoe gear. Follow up as needed for painful nails. ? ? ? ? ?Return in about 3 months (around 04/25/2022) for at risk diabetic foot care.  ? ? ?

## 2022-04-26 ENCOUNTER — Ambulatory Visit (INDEPENDENT_AMBULATORY_CARE_PROVIDER_SITE_OTHER): Payer: Medicare Other | Admitting: Podiatry

## 2022-04-26 DIAGNOSIS — E785 Hyperlipidemia, unspecified: Secondary | ICD-10-CM | POA: Diagnosis not present

## 2022-04-26 DIAGNOSIS — M79675 Pain in left toe(s): Secondary | ICD-10-CM | POA: Diagnosis not present

## 2022-04-26 DIAGNOSIS — M79674 Pain in right toe(s): Secondary | ICD-10-CM

## 2022-04-26 DIAGNOSIS — L84 Corns and callosities: Secondary | ICD-10-CM

## 2022-04-26 DIAGNOSIS — B351 Tinea unguium: Secondary | ICD-10-CM | POA: Diagnosis not present

## 2022-04-26 DIAGNOSIS — Z89422 Acquired absence of other left toe(s): Secondary | ICD-10-CM | POA: Diagnosis not present

## 2022-04-26 DIAGNOSIS — E1169 Type 2 diabetes mellitus with other specified complication: Secondary | ICD-10-CM

## 2022-04-26 DIAGNOSIS — M216X9 Other acquired deformities of unspecified foot: Secondary | ICD-10-CM

## 2022-04-29 NOTE — Progress Notes (Signed)
  Subjective:  Patient ID: Xavier Turner, male    DOB: 05/23/1956,  MRN: [id number]  Chief Complaint  Patient presents with   Nail Problem    Thick painful toenails, 3 month follow up    Diabetes    risk diabetic foot care    66 y.o. male returns with the above complaint. History confirmed with patient.  Here today for at risk diabetic foot care his nails are thickened elongated and causing pain again.  Objective:  Physical Exam: warm, good capillary refill, no trophic changes or ulcerative lesions, normal DP and PT pulses, and abnormal sensory exam.  He has severe pes cavus foot deformity.  Prominent area of tenderness over the fifth metatarsal base plantarly. Thickened elongated nails with yellow discoloration and subungual debris x10  Summary:  RIGHT:  - No evidence of common femoral vein obstruction.     LEFT:  - There is no evidence of deep vein thrombosis in the lower extremity.  - There is no evidence of superficial venous thrombosis.     - No cystic structure found in the popliteal fossa.      *See table(s) above for measurements and observations.   Electronically signed by Orlie Pollen on 10/06/2021 at 4:20:40 PM.   Lab work was unrevealing for gout or infectious process Assessment:   1. Pain due to onychomycosis of toenails of both feet   2. Type 2 diabetes mellitus with hyperlipidemia ([facility])   3. Cavus foot, acquired   4. 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 Pre-ulcerative calluses         Plan:  Patient was evaluated and treated and all questions answered.  Patient educated on diabetes. Discussed proper diabetic foot care and discussed risks and complications of disease. Educated patient in depth on reasons to return to the office immediately should he/she discover anything concerning or new on the feet. All questions answered. Discussed proper shoes as well.  He will be scheduled for diabetic shoe fitting in the  future  Discussed the etiology and treatment options for the condition in detail with the patient. Educated patient on the topical and oral treatment options for mycotic nails. Recommended debridement of the nails today. Sharp and mechanical debridement performed of all painful and mycotic nails today. Nails debrided in length and thickness using a nail nipper to level of comfort. Discussed treatment options including appropriate shoe gear. Follow up as needed for painful nails.     Return in about 3 months (around 07/27/2022) for at risk diabetic foot care.

## 2022-07-26 ENCOUNTER — Ambulatory Visit (INDEPENDENT_AMBULATORY_CARE_PROVIDER_SITE_OTHER): Payer: Medicare Other | Admitting: Podiatry

## 2022-07-26 DIAGNOSIS — M79675 Pain in left toe(s): Secondary | ICD-10-CM | POA: Diagnosis not present

## 2022-07-26 DIAGNOSIS — E785 Hyperlipidemia, unspecified: Secondary | ICD-10-CM

## 2022-07-26 DIAGNOSIS — L84 Corns and callosities: Secondary | ICD-10-CM

## 2022-07-26 DIAGNOSIS — B351 Tinea unguium: Secondary | ICD-10-CM

## 2022-07-26 DIAGNOSIS — E1169 Type 2 diabetes mellitus with other specified complication: Secondary | ICD-10-CM

## 2022-07-26 DIAGNOSIS — M79674 Pain in right toe(s): Secondary | ICD-10-CM | POA: Diagnosis not present

## 2022-07-26 NOTE — Progress Notes (Signed)
  Subjective:  Patient ID: Xavier Turner, male    DOB: [birth year],  MRN: [id number]  Chief Complaint  Patient presents with   Nail Problem    Thick painful toenails, 3 month follow up    66 y.o. male returns with the above complaint. History confirmed with patient.  Here today for at risk diabetic foot care his nails are thickened elongated and causing pain again.  Objective:  Physical Exam: warm, good capillary refill, no trophic changes or ulcerative lesions, normal DP and PT pulses, and abnormal sensory exam.  He has severe pes cavus foot deformity.  Prominent area of tenderness over the fifth metatarsal base plantarly. Thickened elongated nails with yellow discoloration and subungual debris x10  Summary:  RIGHT:  - No evidence of common femoral vein obstruction.     LEFT:  - There is no evidence of deep vein thrombosis in the lower extremity.  - There is no evidence of superficial venous thrombosis.     - No cystic structure found in the popliteal fossa.      *See table(s) above for measurements and observations.   Electronically signed by Orlie Pollen on 10/06/2021 at 4:20:40 PM.   Lab work was unrevealing for gout or infectious process Assessment:   1. Pain due to onychomycosis of toenails of both feet   2. Callus of foot   3. Type 2 diabetes mellitus with hyperlipidemia ([place])         Plan:  Patient was evaluated and treated and all questions answered.  Patient educated on diabetes. Discussed proper diabetic foot care and discussed risks and complications of disease. Educated patient in depth on reasons to return to the office immediately should he/she discover anything concerning or new on the feet. All questions answered. Discussed proper shoes as well.  He will be scheduled for diabetic shoe fitting in the future  Discussed the etiology and treatment options for the condition in detail with the patient. Educated patient on the topical and oral treatment options  for mycotic nails. Recommended debridement of the nails today. Sharp and mechanical debridement performed of all painful and mycotic nails today. Nails debrided in length and thickness using a nail nipper to level of comfort. Discussed treatment options including appropriate shoe gear. Follow up as needed for painful nails.   Callus lateral fifth metatarsal was debrided as a courtesy today  Return in about 4 months (around 11/24/2022) for at risk diabetic foot care.

## 2022-09-06 ENCOUNTER — Other Ambulatory Visit (HOSPITAL_COMMUNITY): Payer: Self-pay | Admitting: Nephrology

## 2022-09-06 DIAGNOSIS — I129 Hypertensive chronic kidney disease with stage 1 through stage 4 chronic kidney disease, or unspecified chronic kidney disease: Secondary | ICD-10-CM

## 2022-09-11 ENCOUNTER — Other Ambulatory Visit (HOSPITAL_COMMUNITY): Payer: Self-pay | Admitting: Nephrology

## 2022-09-11 DIAGNOSIS — I129 Hypertensive chronic kidney disease with stage 1 through stage 4 chronic kidney disease, or unspecified chronic kidney disease: Secondary | ICD-10-CM

## 2022-09-11 DIAGNOSIS — Q8589 Other phakomatoses, not elsewhere classified: Secondary | ICD-10-CM

## 2022-09-18 ENCOUNTER — Ambulatory Visit (HOSPITAL_COMMUNITY)
Admission: RE | Admit: 2022-09-18 | Discharge: 2022-09-18 | Disposition: A | Discharge status: Home / Self Care | Payer: Medicare Other | Source: Ambulatory Visit | Attending: Nephrology | Admitting: Nephrology

## 2022-09-18 DIAGNOSIS — I129 Hypertensive chronic kidney disease with stage 1 through stage 4 chronic kidney disease, or unspecified chronic kidney disease: Secondary | ICD-10-CM | POA: Diagnosis present

## 2022-09-18 DIAGNOSIS — Q8589 Other phakomatoses, not elsewhere classified: Secondary | ICD-10-CM | POA: Insufficient documentation

## 2022-09-22 ENCOUNTER — Emergency Department (HOSPITAL_COMMUNITY): Payer: Medicare Other

## 2022-09-22 ENCOUNTER — Other Ambulatory Visit: Payer: Self-pay

## 2022-09-22 ENCOUNTER — Emergency Department (HOSPITAL_COMMUNITY)
Admission: EM | Admit: 2022-09-22 | Discharge: 2022-09-23 | Disposition: A | Discharge status: Skilled Nursing Facility | Payer: Medicare Other | Attending: Emergency Medicine | Admitting: Emergency Medicine

## 2022-09-22 DIAGNOSIS — S8001XA Contusion of right knee, initial encounter: Secondary | ICD-10-CM | POA: Diagnosis not present

## 2022-09-22 DIAGNOSIS — W06XXXA Fall from bed, initial encounter: Secondary | ICD-10-CM | POA: Insufficient documentation

## 2022-09-22 DIAGNOSIS — Z7984 Long term (current) use of oral hypoglycemic drugs: Secondary | ICD-10-CM | POA: Diagnosis not present

## 2022-09-22 DIAGNOSIS — R930 Abnormal findings on diagnostic imaging of skull and head, not elsewhere classified: Secondary | ICD-10-CM | POA: Insufficient documentation

## 2022-09-22 DIAGNOSIS — Z7982 Long term (current) use of aspirin: Secondary | ICD-10-CM | POA: Insufficient documentation

## 2022-09-22 DIAGNOSIS — S8992XA Unspecified injury of left lower leg, initial encounter: Secondary | ICD-10-CM | POA: Diagnosis present

## 2022-09-22 DIAGNOSIS — S8002XA Contusion of left knee, initial encounter: Secondary | ICD-10-CM | POA: Diagnosis not present

## 2022-09-22 NOTE — ED Provider Notes (Signed)
[facility] EMERGENCY DEPARTMENT Provider Note   CSN: [id number] Arrival date & time: 09/22/22  1648     History  Chief Complaint  Patient presents with   Xavier Turner    Xavier Turner is a 66 y.o. male.  EMS reports that patient was sitting on the side of his bed and fell asleep.  Patient fell forward out of his bed onto his knees.  Patient reports that he did strike his head.  Patient reports he also fell yesterday.  Patient does not believe that he lost consciousness.  Patient complains of some soreness to both knees.  Patient denies any neck pain he denies any back pain he denies any hip pain he has not had any abdominal pain.  Patient currently resides at [facility]  The history is provided by the patient, the nursing home and the EMS personnel. No language interpreter was used.  Fall This is a new problem. The current episode started 1 to 2 hours ago.       Home Medications Prior to Admission medications   Medication Sig Start Date End Date Taking? Authorizing Provider  aspirin 81 MG chewable tablet Chew 81 mg by mouth daily.   Yes [provider]  brimonidine (ALPHAGAN) 0.2 % ophthalmic solution Place 1 drop into both eyes 3 (three) times daily. 08/28/19  Yes Hennie Duos, MD  carboxymethylcellulose (REFRESH PLUS) 0.5 % SOLN Place 1 drop into both eyes 4 (four) times daily as needed (dry eyes).   Yes [provider]  cholecalciferol (VITAMIN D3) 25 MCG (1000 UT) tablet Take 1 tablet (1,000 Units total) by mouth daily. 08/28/19  Yes Hennie Duos, MD  dorzolamide-timolol (COSOPT) 22.3-6.8 MG/ML ophthalmic solution Place 1 drop into the right eye 2 (two) times daily. Patient taking differently: Place 1 drop into the right eye in the morning and at bedtime. 08/28/19  Yes Hennie Duos, MD  Emollient (EUCERIN) lotion Apply 1 Application topically as needed for dry skin.   Yes [provider]  escitalopram (LEXAPRO) 10 MG tablet Take 10 mg by  mouth daily. 12/20/21  Yes [provider]  furosemide (LASIX) 20 MG tablet Take 20 mg by mouth daily.   Yes [provider]  gabapentin (NEURONTIN) 100 MG capsule Take 1 capsule (100 mg total) by mouth 2 (two) times daily. Patient taking differently: Take 100 mg by mouth 3 (three) times daily. 08/28/19  Yes Hennie Duos, MD  hydrochlorothiazide (HYDRODIURIL) 25 MG tablet Take 1 tablet (25 mg total) by mouth daily. 08/28/19  Yes Hennie Duos, MD  JARDIANCE 10 MG TABS tablet Take 10 mg by mouth daily. 04/03/21  Yes [provider]  melatonin 3 MG TABS tablet Take 3 mg by mouth at bedtime.   Yes [provider]  meloxicam (MOBIC) 15 MG tablet Take 1 tablet (15 mg total) by mouth daily. 12/30/19  Yes Edrick Kins, DPM  metFORMIN (GLUCOPHAGE) 1000 MG tablet Take 1 tablet (1,000 mg total) by mouth 2 (two) times daily with a meal. 08/28/19  Yes Hennie Duos, MD  Multiple Vitamin (MULTIVITAMIN) TABS Take 1 tablet by mouth daily with breakfast.   Yes [provider]  omeprazole (PRILOSEC) 20 MG capsule Take 1 capsule (20 mg total) by mouth daily. Patient taking differently: Take 40 mg by mouth in the morning. 08/28/19  Yes Hennie Duos, MD  potassium chloride SA (KLOR-CON M) 20 MEQ tablet Take 20 mEq by mouth daily.   Yes  [provider]  ROCKLATAN 0.02-0.005 % SOLN Place 1 drop into both eyes at bedtime. 08/28/19  Yes Hennie Duos, MD  rosuvastatin (CRESTOR) 20 MG tablet Take 1 tablet (20 mg total) by mouth daily. Patient taking differently: Take 20 mg by mouth at bedtime. 08/28/19  Yes Hennie Duos, MD  sitaGLIPtin (JANUVIA) 100 MG tablet Take 1 tablet (100 mg total) by mouth daily. 08/28/19  Yes Hennie Duos, MD  tamsulosin (FLOMAX) 0.4 MG CAPS capsule Take 1 capsule (0.4 mg total) by mouth daily. 08/28/19  Yes Hennie Duos, MD  hydrOXYzine (ATARAX/VISTARIL) 10 MG tablet Take 5 mg by mouth at bedtime as needed for  itching.    [provider]  Misc. Devices MISC Please supply patient with shower chair with U-shaped seat. 10/07/19   [provider]      Allergies    Patient has no known allergies.    Review of Systems   Review of Systems  Musculoskeletal:  Positive for joint swelling.  Skin:  Positive for wound.  All other systems reviewed and are negative.   Physical Exam Updated Vital Signs BP 120/82   Pulse 68   Temp 98.2 F (36.8 C)   Resp 18   Ht '5\' 5"'$  (1.651 m)   Wt 84.4 kg   SpO2 95%   BMI 30.97 kg/m  Physical Exam Vitals and nursing note reviewed.  Constitutional:      Appearance: He is well-developed.  HENT:     Head: Normocephalic.     Comments: No obvious area of trauma Cardiovascular:     Rate and Rhythm: Normal rate.  Pulmonary:     Effort: Pulmonary effort is normal.  Abdominal:     General: There is no distension.  Musculoskeletal:        General: Swelling present.     Cervical back: Normal range of motion.     Comments: Abrasions bilateral knees full range of motion no deformities  Neurological:     Mental Status: He is alert and oriented to person, place, and time.     ED Results / Procedures / Treatments   Labs (all labs ordered are listed, but only abnormal results are displayed) Labs Reviewed - No data to display  EKG None  Radiology DG Knee Complete 4 Views Left  Result Date: 09/22/2022 CLINICAL DATA:  Trauma, fall EXAM: LEFT KNEE - COMPLETE 4+ VIEW COMPARISON:  None Available. FINDINGS: No fracture or dislocation is seen. There is no effusion in suprapatellar bursa. IMPRESSION: No fracture or dislocation is seen in left knee. Electronically Signed   By: Elmer Picker M.D.   On: 09/22/2022 18:11   DG Knee Complete 4 Views Right  Result Date: 09/22/2022 CLINICAL DATA:  Trauma, fall EXAM: RIGHT KNEE - COMPLETE 4+ VIEW COMPARISON:  None Available. FINDINGS: No displaced fracture or dislocation is seen. Moderate bony spurs  are seen in patellofemoral compartment. Possible tiny bony spurs seen in medial and lateral compartments. There is no significant effusion. IMPRESSION: No recent fracture or dislocation is seen in right knee. Degenerative changes are noted, more so in the patellofemoral compartment. Electronically Signed   By: Elmer Picker M.D.   On: 09/22/2022 18:09   CT Head Wo Contrast  Result Date: 09/22/2022 CLINICAL DATA:  Facial trauma. Fall. Patient found on floor. Patient reports being sleepy since the fall. EXAM: CT HEAD WITHOUT CONTRAST TECHNIQUE: Contiguous axial images were obtained from the base of the skull through the vertex without  intravenous contrast. RADIATION DOSE REDUCTION: This exam was performed according to the departmental dose-optimization program which includes automated exposure control, adjustment of the mA and/or kV according to patient size and/or use of iterative reconstruction technique. COMPARISON:  CT head without contrast 09/19/2021 FINDINGS: Brain: Chronic encephalomalacia and dense calcifications are again noted within the right hemisphere. Left hemisphere to demonstrates stable medial encephalomalacia in the anterior left frontal lobe. No acute abnormality. The remainder the hemisphere is grossly normal in stable. Ventricles are stable in size. No significant extra-axial fluid collection is present. The brainstem and cerebellum are within normal limits. Vascular: No hyperdense vessel or unexpected calcification. Skull: Right frontal craniotomy noted. Acute fractures are present. Mild soft tissue swelling is present in the right supraorbital scalp without underlying fracture. Sinuses/Orbits: Asymmetric enlarged right frontal sinus again noted. The paranasal sinuses and mastoid air cells are clear. Bilateral lens replacements are noted. Asymmetric right-sided exophthalmos is stable. IMPRESSION: 1. Mild soft tissue swelling in the right supraorbital scalp without underlying fracture.  2. No acute intracranial abnormality or significant interval change. 3. 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 4. Stable medial encephalomalacia in the anterior left frontal lobe. Electronically Signed   By: San Morelle M.D.   On: 09/22/2022 17:59    Procedures Procedures    Medications Ordered in ED Medications - No data to display  ED Course/ Medical Decision Making/ A&P                           Medical Decision Making Patient fell out of bed and landed on his knees.  Patient complains of soreness in both knees he reports he also believes he struck his head he did not lose consciousness  Amount and/or Complexity of Data Reviewed Independent Historian: EMS    Details: History provided by EMS and nursing home staff Radiology: ordered and independent interpretation performed. Decision-making details documented in ED Course.    Details: X-ray bilateral knees no fracture CT head no acute abnormality  Risk OTC drugs. Decision regarding hospitalization. Risk Details: Patient has abrasions to bilateral knees he does not seem to have any significant injuries patient is stable to return to nursing home for ongoing care           Final Clinical Impression(s) / ED Diagnoses Final diagnoses:  Contusion of left knee, initial encounter  Contusion of right knee, initial encounter    Rx / DC Orders ED Discharge Orders     None      An After Visit Summary was printed and given to the patient.    Fransico Meadow, [place] 09/22/22 [id number]    Audley Hose, MD 09/22/22 [phone]

## 2022-09-22 NOTE — Discharge Instructions (Signed)
Return if any problems.

## 2022-09-22 NOTE — ED Triage Notes (Signed)
Patient brought in via EMS from [facility] for fall. Patient alert and oriented. Airway patent. Patient reported to have possibly fallen asleep while sitting on side of bed and found on floor on "all fours" by staff. Patient c/o headache and bilateral knee pain. Patient states that he fell yesterday and hit forehead but was not seen. Patient states he has been "sleepy since fall." Paramedic states this was not reported to them. Patient's blood sugar per paramedic was 136. Unsure of any anticoagulants. Unable to determine and bruising to forehead due to large birthmark covering most of patient's right side of face and neck. No open wounds or lacerations noted.

## 2022-09-22 NOTE — ED Notes (Signed)
Pt given diet ginger ale per request

## 2022-09-22 NOTE — ED Notes (Signed)
Pt wet his closed and was just cleaned up and changed.

## 2022-09-22 NOTE — ED Notes (Signed)
[facility] transport called to transport back to [facility].

## 2022-09-23 NOTE — ED Notes (Addendum)
Pt given ice water- pt requesting food- [place] sandwich offered, pt says he doesn't like [place], nothing else to offer at this time- will order pt breakfast tray for am- pt informed of this plan.

## 2022-09-23 NOTE — ED Notes (Signed)
Pt assisted with sitting up in bed, given diet gingerale and pack of nabs per request.

## 2022-09-23 NOTE — ED Notes (Signed)
given another diet gingerale and pack of nabs per request.

## 2022-09-28 ENCOUNTER — Ambulatory Visit (HOSPITAL_COMMUNITY)
Admission: RE | Admit: 2022-09-28 | Discharge: 2022-09-28 | Disposition: A | Discharge status: Home / Self Care | Payer: Medicare Other | Source: Ambulatory Visit | Attending: Nephrology | Admitting: Nephrology

## 2022-09-28 DIAGNOSIS — G4730 Sleep apnea, unspecified: Secondary | ICD-10-CM | POA: Diagnosis not present

## 2022-09-28 DIAGNOSIS — E785 Hyperlipidemia, unspecified: Secondary | ICD-10-CM | POA: Diagnosis not present

## 2022-09-28 DIAGNOSIS — I10 Essential (primary) hypertension: Secondary | ICD-10-CM | POA: Insufficient documentation

## 2022-09-28 DIAGNOSIS — E119 Type 2 diabetes mellitus without complications: Secondary | ICD-10-CM | POA: Insufficient documentation

## 2022-09-28 DIAGNOSIS — I129 Hypertensive chronic kidney disease with stage 1 through stage 4 chronic kidney disease, or unspecified chronic kidney disease: Secondary | ICD-10-CM | POA: Diagnosis not present

## 2022-09-28 LAB — ECHOCARDIOGRAM COMPLETE
Area-P 1/2: 2.84 cm2
S' Lateral: 2.6 cm

## 2022-09-28 MED ORDER — PERFLUTREN LIPID MICROSPHERE
1.0000 mL | INTRAVENOUS | Status: AC | PRN
  Administered 2022-09-28: 3 mL via INTRAVENOUS

## 2022-11-27 ENCOUNTER — Ambulatory Visit (INDEPENDENT_AMBULATORY_CARE_PROVIDER_SITE_OTHER): Payer: Medicare Other | Admitting: Podiatry

## 2022-11-27 DIAGNOSIS — M79675 Pain in left toe(s): Secondary | ICD-10-CM | POA: Diagnosis not present

## 2022-11-27 DIAGNOSIS — L84 Corns and callosities: Secondary | ICD-10-CM | POA: Diagnosis not present

## 2022-11-27 DIAGNOSIS — E785 Hyperlipidemia, unspecified: Secondary | ICD-10-CM | POA: Diagnosis not present

## 2022-11-27 DIAGNOSIS — B351 Tinea unguium: Secondary | ICD-10-CM

## 2022-11-27 DIAGNOSIS — M79674 Pain in right toe(s): Secondary | ICD-10-CM

## 2022-11-27 DIAGNOSIS — E1169 Type 2 diabetes mellitus with other specified complication: Secondary | ICD-10-CM | POA: Diagnosis not present

## 2022-11-27 NOTE — Progress Notes (Signed)
  Subjective:  Patient ID: Xavier Turner, male    DOB: [birth year],  MRN: [id number]  Chief Complaint  Patient presents with   Nail Problem    Thick painful toenails, 3 month follow up    67 y.o. male returns with the above complaint. History confirmed with patient.  Here today for at risk diabetic foot care his nails are thickened elongated and causing pain again.  Calluses returned and is painful as well  Objective:  Physical Exam: warm, good capillary refill, no trophic changes or ulcerative lesions, normal DP and PT pulses, and abnormal sensory exam.  He has severe pes cavus foot deformity.  Prominent area of tenderness over the fifth metatarsal base plantarly.  Hyperkeratotic painful callus here.  Thickened elongated nails with yellow discoloration and subungual debris of all remaining toenails  Summary:  RIGHT:  - No evidence of common femoral vein obstruction.     LEFT:  - There is no evidence of deep vein thrombosis in the lower extremity.  - There is no evidence of superficial venous thrombosis.     - No cystic structure found in the popliteal fossa.      *See table(s) above for measurements and observations.   Electronically signed by Orlie Pollen on 10/06/2021 at 4:20:40 PM.   Lab work was unrevealing for gout or infectious process Assessment:   1. Pain due to onychomycosis of toenails of both feet   2. Callus of foot   3. Type 2 diabetes mellitus with hyperlipidemia ([place])         Plan:  Patient was evaluated and treated and all questions answered.  Patient educated on diabetes. Discussed proper diabetic foot care and discussed risks and complications of disease. Educated patient in depth on reasons to return to the office immediately should he/she discover anything concerning or new on the feet. All questions answered. Discussed proper shoes as well.  He will be scheduled for diabetic shoe fitting in the future  Discussed the etiology and treatment options for  the condition in detail with the patient. Educated patient on the topical and oral treatment options for mycotic nails. Recommended debridement of the nails today. Sharp and mechanical debridement performed of all painful and mycotic nails today. Nails debrided in length and thickness using a nail nipper to level of comfort. Discussed treatment options including appropriate shoe gear. Follow up as needed for painful nails.  All symptomatic hyperkeratoses were safely debrided with a sterile #15 blade to patient's level of comfort without incident. We discussed preventative and palliative care of these lesions including supportive and accommodative shoegear, padding, prefabricated and custom molded accommodative orthoses, use of a pumice stone and lotions/creams daily.  Return in about 4 months (around 11/24/2022) for at risk diabetic foot care.

## 2022-12-20 ENCOUNTER — Emergency Department (HOSPITAL_COMMUNITY): Payer: Medicare Other

## 2022-12-20 ENCOUNTER — Encounter (HOSPITAL_COMMUNITY): Payer: Self-pay

## 2022-12-20 ENCOUNTER — Emergency Department (HOSPITAL_COMMUNITY)
Admission: EM | Admit: 2022-12-20 | Discharge: 2022-12-20 | Disposition: A | Discharge status: Skilled Nursing Facility | Payer: Medicare Other | Attending: Student | Admitting: Student

## 2022-12-20 ENCOUNTER — Other Ambulatory Visit: Payer: Self-pay

## 2022-12-20 DIAGNOSIS — Z7984 Long term (current) use of oral hypoglycemic drugs: Secondary | ICD-10-CM | POA: Insufficient documentation

## 2022-12-20 DIAGNOSIS — Z79899 Other long term (current) drug therapy: Secondary | ICD-10-CM | POA: Insufficient documentation

## 2022-12-20 DIAGNOSIS — Z7982 Long term (current) use of aspirin: Secondary | ICD-10-CM | POA: Insufficient documentation

## 2022-12-20 DIAGNOSIS — W19XXXA Unspecified fall, initial encounter: Secondary | ICD-10-CM | POA: Insufficient documentation

## 2022-12-20 DIAGNOSIS — Z1152 Encounter for screening for COVID-19: Secondary | ICD-10-CM | POA: Insufficient documentation

## 2022-12-20 DIAGNOSIS — M25561 Pain in right knee: Secondary | ICD-10-CM | POA: Insufficient documentation

## 2022-12-20 DIAGNOSIS — I10 Essential (primary) hypertension: Secondary | ICD-10-CM | POA: Insufficient documentation

## 2022-12-20 DIAGNOSIS — S0031XA Abrasion of nose, initial encounter: Secondary | ICD-10-CM | POA: Diagnosis not present

## 2022-12-20 DIAGNOSIS — R5383 Other fatigue: Secondary | ICD-10-CM | POA: Diagnosis not present

## 2022-12-20 DIAGNOSIS — E119 Type 2 diabetes mellitus without complications: Secondary | ICD-10-CM | POA: Insufficient documentation

## 2022-12-20 DIAGNOSIS — D72829 Elevated white blood cell count, unspecified: Secondary | ICD-10-CM | POA: Insufficient documentation

## 2022-12-20 DIAGNOSIS — S0990XA Unspecified injury of head, initial encounter: Secondary | ICD-10-CM | POA: Diagnosis present

## 2022-12-20 DIAGNOSIS — E876 Hypokalemia: Secondary | ICD-10-CM | POA: Insufficient documentation

## 2022-12-20 LAB — COMPREHENSIVE METABOLIC PANEL
ALT: 61 U/L — ABNORMAL HIGH (ref 0–44)
AST: 37 U/L (ref 15–41)
Albumin: 3.3 g/dL — ABNORMAL LOW (ref 3.5–5.0)
Alkaline Phosphatase: 126 U/L (ref 38–126)
Anion gap: 10 (ref 5–15)
BUN: 23 mg/dL (ref 8–23)
CO2: 30 mmol/L (ref 22–32)
Calcium: 8.8 mg/dL — ABNORMAL LOW (ref 8.9–10.3)
Chloride: 96 mmol/L — ABNORMAL LOW (ref 98–111)
Creatinine, Ser: 0.65 mg/dL (ref 0.61–1.24)
GFR, Estimated: >60 mL/min (ref >60)
Glucose, Bld: 121 mg/dL — ABNORMAL HIGH (ref 70–99)
Potassium: 3.1 mmol/L — ABNORMAL LOW (ref 3.5–5.1)
Sodium: 136 mmol/L (ref 135–145)
Total Bilirubin: 1.1 mg/dL (ref 0.3–1.2)
Total Protein: 7.5 g/dL (ref 6.5–8.1)

## 2022-12-20 LAB — CBC WITH DIFFERENTIAL/PLATELET
Abs Immature Granulocytes: 0.05 10*3/uL (ref 0.00–0.07)
Basophils Absolute: 0 10*3/uL (ref 0.0–0.1)
Basophils Relative: 0 %
Eosinophils Absolute: 0.2 10*3/uL (ref 0.0–0.5)
Eosinophils Relative: 2 %
HCT: 45.5 % (ref 39.0–52.0)
Hemoglobin: 13.6 g/dL (ref 13.0–17.0)
Immature Granulocytes: 0 %
Lymphocytes Relative: 12 %
Lymphs Abs: 1.4 10*3/uL (ref 0.7–4.0)
MCH: 24.2 pg — ABNORMAL LOW (ref 26.0–34.0)
MCHC: 29.9 g/dL — ABNORMAL LOW (ref 30.0–36.0)
MCV: 81.1 fL (ref 80.0–100.0)
Monocytes Absolute: 0.9 10*3/uL (ref 0.1–1.0)
Monocytes Relative: 8 %
Neutro Abs: 9 10*3/uL — ABNORMAL HIGH (ref 1.7–7.7)
Neutrophils Relative %: 78 %
Platelets: 261 10*3/uL (ref 150–400)
RBC: 5.61 MIL/uL (ref 4.22–5.81)
RDW: 17.7 % — ABNORMAL HIGH (ref 11.5–15.5)
WBC: 11.6 10*3/uL — ABNORMAL HIGH (ref 4.0–10.5)
nRBC: 0 % (ref 0.0–0.2)

## 2022-12-20 LAB — RESP PANEL BY RT-PCR (RSV, FLU A&B, COVID)  RVPGX2
Influenza A by PCR: NEGATIVE
Influenza B by PCR: NEGATIVE
Resp Syncytial Virus by PCR: NEGATIVE
SARS Coronavirus 2 by RT PCR: NEGATIVE

## 2022-12-20 MED ORDER — POTASSIUM CHLORIDE CRYS ER 20 MEQ PO TBCR
40.0000 meq | EXTENDED_RELEASE_TABLET | Freq: Once | ORAL | Status: AC
  Administered 2022-12-20: 40 meq via ORAL
  Filled 2022-12-20: qty 2

## 2022-12-20 MED ORDER — KETOROLAC TROMETHAMINE 15 MG/ML IJ SOLN
15.0000 mg | Freq: Once | INTRAMUSCULAR | Status: AC
  Administered 2022-12-20: 15 mg via INTRAVENOUS
  Filled 2022-12-20: qty 1

## 2022-12-20 MED ORDER — MAGNESIUM OXIDE -MG SUPPLEMENT 400 (240 MG) MG PO TABS
800.0000 mg | ORAL_TABLET | Freq: Once | ORAL | Status: AC
  Administered 2022-12-20: 800 mg via ORAL
  Filled 2022-12-20: qty 2

## 2022-12-20 NOTE — ED Notes (Signed)
[place] to gave report and information regarding pt discharge. Secretary also made call regarding transportation for patient.

## 2022-12-20 NOTE — ED Triage Notes (Signed)
Xavier Turner comes by [employer] from [name]. "Not" had a virus for about a week. AMS since virus  A&Ox2  [name] out of bed this am small lac between eyes and says right knee hurts the most. Pt on ASA daily. Unknown what virus he was being treated for.

## 2022-12-20 NOTE — ED Notes (Signed)
Nursing Secretary made phone call to follow-up on transportation for pt back to [facility] again

## 2022-12-20 NOTE — ED Provider Notes (Signed)
[facility] Provider Note  CSN: [id number] Arrival date & time: 12/20/22 [id number]  Chief Complaint(s) Fall  HPI Xavier Turner is a 67 y.o. 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  History obtained from EMS who states that nursing home also stated that he has been being treated for "a virus" with IV fluids but nursing home staff able to elaborate any more on the specific detail.  They state that usually he is ambulatory and is no longer able to walk.  Patient fell try to get out of bed today and arrives with a small abrasion to the bridge of the nose and complains of right knee pain.  Denies chest pain, shortness of breath, abdominal pain, nausea, vomiting or other systemic symptoms.  Past Medical History Past Medical History:  Diagnosis Date   Diabetes ([place])    Glaucoma    Hemiparesis ([place])    Hypercholesterolemia    Hyperlipemia    Melanoma ([place])    Melanoma ([place])    Obesity    OSA (obstructive sleep apnea)    Seizure ([place])    Seizure disorder ([place])    Sturge syndrome ([place])    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[place])    Patient Active Problem List   Diagnosis Date Noted   Iron deficiency anemia 12/15/2021   Cellulitis of left foot 12/14/2021   Edema 07/19/2021   Pre-ulcerative calluses 01/17/2021   Pain due to onychomycosis of toenails of both feet 10/21/2019   Amputation of little toe ([place]) 09/11/2019   Poor mobility 09/11/2019   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 ([place]) 07/31/2019   Acute osteomyelitis of metatarsal bone of left foot ([place]) 07/28/2019   Hyperlipidemia associated with type 2 diabetes mellitus ([place]) 07/28/2019   Hypertension associated with diabetes ([place]) 07/28/2019   Depression 07/28/2019   Subacute osteomyelitis, left ankle and  foot ([place])    Recurrent cellulitis of lower extremity 07/21/2019   Recurrent cellulitis 07/21/2019   Lactic acidosis 07/21/2019   Hyponatremia 07/08/2019   GERD (gastroesophageal reflux disease) 07/08/2019   Type 2 diabetes mellitus with hyperlipidemia ([place]) 07/08/2019   BPH (benign prostatic hyperplasia) 07/08/2019   Cellulitis 07/06/2019   Droopy eyelid, right 11/26/2017   Laryngitis 09/27/2017   Adjustment disorder with depressed mood 02/29/2016   Bilateral edema of lower extremity 09/14/2015   High risk medications (not anticoagulants) long-term use 09/14/2015   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 Authorizing Provider  aspirin 81 MG chewable tablet Chew 81 mg by mouth daily.    [provider]  brimonidine (ALPHAGAN) 0.2 % ophthalmic solution Place 1 drop into both eyes 3 (three) times daily. 08/28/19   Hennie Duos, MD  carboxymethylcellulose (REFRESH PLUS) 0.5 % SOLN Place 1 drop into both eyes 4 (four) times  daily as needed (dry eyes).    [provider]  cholecalciferol (VITAMIN D3) 25 MCG (1000 UT) tablet Take 1 tablet (1,000 Units total) by mouth daily. 08/28/19   Hennie Duos, MD  dorzolamide-timolol (COSOPT) 22.3-6.8 MG/ML ophthalmic solution Place 1 drop into the right eye 2 (two) times daily. Patient taking differently: Place 1 drop into the right eye in the morning and at bedtime. 08/28/19   Hennie Duos, MD   Emollient (EUCERIN) lotion Apply 1 Application topically as needed for dry skin.    [provider]  escitalopram (LEXAPRO) 10 MG tablet Take 10 mg by mouth daily. 12/20/21   [provider]  furosemide (LASIX) 20 MG tablet Take 20 mg by mouth daily.    [provider]  gabapentin (NEURONTIN) 100 MG capsule Take 1 capsule (100 mg total) by mouth 2 (two) times daily. Patient taking differently: Take 100 mg by mouth 3 (three) times daily. 08/28/19   Hennie Duos, MD  hydrochlorothiazide (HYDRODIURIL) 25 MG tablet Take 1 tablet (25 mg total) by mouth daily. 08/28/19   Hennie Duos, MD  hydrOXYzine (ATARAX/VISTARIL) 10 MG tablet Take 5 mg by mouth at bedtime as needed for itching.    [provider]  JARDIANCE 10 MG TABS tablet Take 10 mg by mouth daily. 04/03/21   [provider]  melatonin 3 MG TABS tablet Take 3 mg by mouth at bedtime.    [provider]  meloxicam (MOBIC) 15 MG tablet Take 1 tablet (15 mg total) by mouth daily. 12/30/19   Edrick Kins, DPM  metFORMIN (GLUCOPHAGE) 1000 MG tablet Take 1 tablet (1,000 mg total) by mouth 2 (two) times daily with a meal. 08/28/19   Hennie Duos, MD  Misc. Devices MISC Please supply patient with shower chair with U-shaped seat. 10/07/19   [provider]  Multiple Vitamin (MULTIVITAMIN) TABS Take 1 tablet by mouth daily with breakfast.    [provider]  omeprazole (PRILOSEC) 20 MG capsule Take 1 capsule (20 mg total) by mouth daily. Patient taking differently: Take 40 mg by mouth in the morning. 08/28/19   Hennie Duos, MD  potassium chloride SA (KLOR-CON M) 20 MEQ tablet Take 20 mEq by mouth daily.    [provider]  ROCKLATAN 0.02-0.005 % SOLN Place 1 drop into both eyes at bedtime. 08/28/19   Hennie Duos, MD  rosuvastatin (CRESTOR) 20 MG tablet Take 1 tablet (20 mg total) by mouth daily. Patient taking differently: Take 20 mg by mouth at bedtime.  08/28/19   Hennie Duos, MD  sitaGLIPtin (JANUVIA) 100 MG tablet Take 1 tablet (100 mg total) by mouth daily. 08/28/19   Hennie Duos, MD  tamsulosin (FLOMAX) 0.4 MG CAPS capsule Take 1 capsule (0.4 mg total) by mouth daily. 08/28/19   Hennie Duos, MD  Past Surgical History Past Surgical History:  Procedure Laterality Date   AMPUTATION Left 07/24/2019   Procedure: AMPUTATION RAY, fifth left;  Surgeon: Newt Minion, MD;  Location: [facility];  Service: Orthopedics;  Laterality: Left;   COLONOSCOPY     CRANIOTOMY  1999   melanoma removal  2011   Family History Family History  Problem Relation Age of Onset   Emphysema Father    Cancer Father    Asthma Mother     Social History Social History   Tobacco Use   Smoking status: Never   Smokeless tobacco: Never   Allergies Patient has no known allergies.  Review of Systems Review of Systems  Constitutional:  Positive for fatigue.  Musculoskeletal:  Positive for arthralgias.  Neurological:  Positive for weakness.    Physical Exam Vital Signs  I have reviewed the triage vital signs Temp 98.1 F (36.7 C) (Oral)   Physical Exam Constitutional:      General: He is not in acute distress.    Appearance: Normal appearance.  HENT:     Head: Normocephalic.     Comments: Abrasion to bridge of nose    Nose: No congestion or rhinorrhea.  Eyes:     General:        Right eye: No discharge.        Left eye: No discharge.     Extraocular Movements: Extraocular movements intact.     Pupils: Pupils are equal, round, and reactive to light.  Cardiovascular:     Rate and Rhythm: Normal rate and regular rhythm.     Heart sounds: No murmur heard. Pulmonary:     Effort: No respiratory distress.     Breath sounds: No wheezing or rales.  Abdominal:     General: There is no distension.      Tenderness: There is no abdominal tenderness.  Musculoskeletal:        General: Tenderness present. Normal range of motion.     Cervical back: Normal range of motion.  Skin:    General: Skin is warm and dry.  Neurological:     General: No focal deficit present.     Mental Status: He is alert.     ED Results and Treatments Labs (all labs ordered are listed, but only abnormal results are displayed) Labs Reviewed  RESP PANEL BY RT-PCR (RSV, FLU A&B, COVID)  RVPGX2  COMPREHENSIVE METABOLIC PANEL  CBC WITH DIFFERENTIAL/PLATELET  URINALYSIS, ROUTINE W REFLEX MICROSCOPIC                                                                                                                          Radiology No results found.  Pertinent labs & imaging results that were available during my care of the patient were reviewed by me and considered in my medical decision making (see MDM for details).  Medications Ordered in ED Medications  ketorolac (TORADOL) 15 MG/ML injection 15 mg (has no administration in time range)  Procedures Procedures  (including critical care time)  Medical Decision Making / ED Course   This patient presents to the ED for concern of fall, this involves an extensive number of treatment options, and is a complaint that carries with it a high risk of complications and morbidity.  The differential diagnosis includes fracture, contusion, hematoma, ligamentous injury, electrolyte abnormality  MDM: Patient seen emergency room for evaluation of a fall.  Physical exam with port wine stain over the entire right side of the face, left hemiparesis that is chronic and at baseline for this patient, small abrasion to the glabella, tenderness to the right knee.  Laboratory evaluation is largely unremarkable outside of mild hypokalemia which was repleted in  the emergency department and a mild leukocytosis to 11.6 likely stress demargination from the patient's fall today.  COVID, flu, RSV negative and obtained in the setting of generalized fatigue and nursing home concern for a viral illness.  Trauma imaging including CT head and C-spine does not show acute pathology and does redemonstrate the significant encephalomalacia noted previously.  X-ray imaging of the chest and knee is also unremarkable.  Patient pain controlled with Toradol and on reevaluation is symptomatically improved.  Patient then discharged back to facility   Additional history obtained:  -External records from outside source obtained and reviewed including: Chart review including previous notes, labs, imaging, consultation notes   Lab Tests: -I ordered, reviewed, and interpreted labs.   The pertinent results include:   Labs Reviewed  RESP PANEL BY RT-PCR (RSV, FLU A&B, COVID)  RVPGX2  COMPREHENSIVE METABOLIC PANEL  CBC WITH DIFFERENTIAL/PLATELET  URINALYSIS, ROUTINE W REFLEX MICROSCOPIC      Imaging Studies ordered: I ordered imaging studies including CT head, C-spine, chest x-ray, knee x-ray I independently visualized and interpreted imaging. I agree with the radiologist interpretation   Medicines ordered and prescription drug management: Meds ordered this encounter  Medications   ketorolac (TORADOL) 15 MG/ML injection 15 mg    -I have reviewed the patients home medicines and have made adjustments as needed  Critical interventions none   Cardiac Monitoring: The patient was maintained on a cardiac monitor.  I personally viewed and interpreted the cardiac monitored which showed an underlying rhythm of: NSR  Social Determinants of Health:  Factors impacting patients care include: none   Reevaluation: After the interventions noted above, I reevaluated the patient and found that they have :improved  Co morbidities that complicate the patient evaluation  Past  Medical History:  Diagnosis Date   Diabetes ([place])    Glaucoma    Hemiparesis ([place])    Hypercholesterolemia    Hyperlipemia    Melanoma ([place])    Melanoma ([place])    Obesity    OSA (obstructive sleep apnea)    Seizure ([place])    Seizure disorder ([place])    Sturge syndrome ([place])    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[place])       Dispostion: I considered admission for this patient, but at this time he does not meet inpatient criteria for admission he is safe for discharge with outpatient follow-up     Final Clinical Impression(s) / ED Diagnoses Final diagnoses:  None     @PCDICTATION @    Laren Whaling, Debe Coder, MD 12/20/22 1428

## 2022-12-21 ENCOUNTER — Emergency Department (HOSPITAL_COMMUNITY)
Admission: EM | Admit: 2022-12-21 | Discharge: 2022-12-22 | Disposition: A | Discharge status: Skilled Nursing Facility | Payer: Medicare Other | Attending: Emergency Medicine | Admitting: Emergency Medicine

## 2022-12-21 ENCOUNTER — Encounter (HOSPITAL_COMMUNITY): Payer: Self-pay | Admitting: *Deleted

## 2022-12-21 ENCOUNTER — Emergency Department (HOSPITAL_COMMUNITY): Payer: Medicare Other

## 2022-12-21 ENCOUNTER — Other Ambulatory Visit: Payer: Self-pay

## 2022-12-21 DIAGNOSIS — J449 Chronic obstructive pulmonary disease, unspecified: Secondary | ICD-10-CM | POA: Insufficient documentation

## 2022-12-21 DIAGNOSIS — R4182 Altered mental status, unspecified: Secondary | ICD-10-CM | POA: Insufficient documentation

## 2022-12-21 DIAGNOSIS — H0520 Unspecified exophthalmos: Secondary | ICD-10-CM | POA: Diagnosis not present

## 2022-12-21 DIAGNOSIS — Z7982 Long term (current) use of aspirin: Secondary | ICD-10-CM | POA: Insufficient documentation

## 2022-12-21 DIAGNOSIS — G9389 Other specified disorders of brain: Secondary | ICD-10-CM | POA: Insufficient documentation

## 2022-12-21 DIAGNOSIS — G40909 Epilepsy, unspecified, not intractable, without status epilepticus: Secondary | ICD-10-CM | POA: Diagnosis not present

## 2022-12-21 DIAGNOSIS — E876 Hypokalemia: Secondary | ICD-10-CM | POA: Diagnosis not present

## 2022-12-21 DIAGNOSIS — Z7984 Long term (current) use of oral hypoglycemic drugs: Secondary | ICD-10-CM | POA: Diagnosis not present

## 2022-12-21 DIAGNOSIS — E119 Type 2 diabetes mellitus without complications: Secondary | ICD-10-CM | POA: Insufficient documentation

## 2022-12-21 LAB — URINALYSIS, ROUTINE W REFLEX MICROSCOPIC
Bacteria, UA: NONE SEEN
Bilirubin Urine: NEGATIVE
Glucose, UA: >=500 mg/dL — AB
Hgb urine dipstick: NEGATIVE
Ketones, ur: 5 mg/dL — AB
Leukocytes,Ua: NEGATIVE
Nitrite: NEGATIVE
Protein, ur: 100 mg/dL — AB
Specific Gravity, Urine: 1.027 (ref 1.005–1.030)
pH: 5 (ref 5.0–8.0)

## 2022-12-21 LAB — CBC WITH DIFFERENTIAL/PLATELET
Abs Immature Granulocytes: 0.05 10*3/uL (ref 0.00–0.07)
Basophils Absolute: 0 10*3/uL (ref 0.0–0.1)
Basophils Relative: 0 %
Eosinophils Absolute: 0.1 10*3/uL (ref 0.0–0.5)
Eosinophils Relative: 0 %
HCT: 45.5 % (ref 39.0–52.0)
Hemoglobin: 13.9 g/dL (ref 13.0–17.0)
Immature Granulocytes: 0 %
Lymphocytes Relative: 2 %
Lymphs Abs: 0.2 10*3/uL — ABNORMAL LOW (ref 0.7–4.0)
MCH: 24.4 pg — ABNORMAL LOW (ref 26.0–34.0)
MCHC: 30.5 g/dL (ref 30.0–36.0)
MCV: 80 fL (ref 80.0–100.0)
Monocytes Absolute: 0.4 10*3/uL (ref 0.1–1.0)
Monocytes Relative: 3 %
Neutro Abs: 11.8 10*3/uL — ABNORMAL HIGH (ref 1.7–7.7)
Neutrophils Relative %: 95 %
Platelets: 279 10*3/uL (ref 150–400)
RBC: 5.69 MIL/uL (ref 4.22–5.81)
RDW: 19.1 % — ABNORMAL HIGH (ref 11.5–15.5)
WBC: 12.5 10*3/uL — ABNORMAL HIGH (ref 4.0–10.5)
nRBC: 0 % (ref 0.0–0.2)

## 2022-12-21 LAB — COMPREHENSIVE METABOLIC PANEL
ALT: 54 U/L — ABNORMAL HIGH (ref 0–44)
AST: 36 U/L (ref 15–41)
Albumin: 3.2 g/dL — ABNORMAL LOW (ref 3.5–5.0)
Alkaline Phosphatase: 104 U/L (ref 38–126)
Anion gap: 8 (ref 5–15)
BUN: 24 mg/dL — ABNORMAL HIGH (ref 8–23)
CO2: 25 mmol/L (ref 22–32)
Calcium: 8.3 mg/dL — ABNORMAL LOW (ref 8.9–10.3)
Chloride: 105 mmol/L (ref 98–111)
Creatinine, Ser: 0.61 mg/dL (ref 0.61–1.24)
GFR, Estimated: >60 mL/min (ref >60)
Glucose, Bld: 153 mg/dL — ABNORMAL HIGH (ref 70–99)
Potassium: 2.9 mmol/L — ABNORMAL LOW (ref 3.5–5.1)
Sodium: 138 mmol/L (ref 135–145)
Total Bilirubin: 1.5 mg/dL — ABNORMAL HIGH (ref 0.3–1.2)
Total Protein: 7.2 g/dL (ref 6.5–8.1)

## 2022-12-21 LAB — LACTIC ACID, PLASMA: Lactic Acid, Venous: 1.3 mmol/L (ref 0.5–1.9)

## 2022-12-21 LAB — BLOOD GAS, VENOUS
Acid-Base Excess: 2.2 mmol/L — ABNORMAL HIGH (ref 0.0–2.0)
Bicarbonate: 27.3 mmol/L (ref 20.0–28.0)
Drawn by: 61927
O2 Saturation: 94 %
Patient temperature: 36.3
pCO2, Ven: 42 mmHg — ABNORMAL LOW (ref 44–60)
pH, Ven: 7.42 (ref 7.25–7.43)
pO2, Ven: 61 mmHg — ABNORMAL HIGH (ref 32–45)

## 2022-12-21 LAB — PROTIME-INR
INR: 1.2 (ref 0.8–1.2)
Prothrombin Time: 14.6 seconds (ref 11.4–15.2)

## 2022-12-21 MED ORDER — POTASSIUM CHLORIDE 10 MEQ/100ML IV SOLN
10.0000 meq | Freq: Once | INTRAVENOUS | Status: AC
  Administered 2022-12-21: 10 meq via INTRAVENOUS
  Filled 2022-12-21: qty 100

## 2022-12-21 MED ORDER — POTASSIUM CHLORIDE CRYS ER 20 MEQ PO TBCR
40.0000 meq | EXTENDED_RELEASE_TABLET | Freq: Once | ORAL | Status: AC
  Administered 2022-12-21: 40 meq via ORAL
  Filled 2022-12-21: qty 2

## 2022-12-21 MED ORDER — SODIUM CHLORIDE 0.9 % IV BOLUS
1000.0000 mL | Freq: Once | INTRAVENOUS | Status: AC
  Administered 2022-12-21: 1000 mL via INTRAVENOUS

## 2022-12-21 NOTE — ED Notes (Signed)
Patient is currently asleep in bed.  °

## 2022-12-21 NOTE — Discharge Instructions (Signed)
Increase your potassium so you are taking 40 mEq a day.  So you need to double the amount of potassium you are taking.  Follow-up with your doctor as needed

## 2022-12-21 NOTE — ED Provider Notes (Signed)
[facility] Provider Note   CSN: [id number] Arrival date & time: 12/21/22  1749     History {Add pertinent medical, surgical, social history, OB history to HPI:1} No chief complaint on file.   Xavier Turner is a 67 y.o. male.  Patient has a history of diabetes and obesity and seizure disorder.  He was sent here for altered mental status.  When EMS picked him up he was oriented x 4.   Weakness      Home Medications Prior to Admission medications   Medication Sig Start Date End Date Taking? Authorizing Provider  aspirin 81 MG chewable tablet Chew 81 mg by mouth daily.   Yes [provider]  brimonidine (ALPHAGAN) 0.2 % ophthalmic solution Place 1 drop into both eyes 3 (three) times daily. 08/28/19  Yes Hennie Duos, MD  carboxymethylcellulose (REFRESH PLUS) 0.5 % SOLN Place 1 drop into both eyes 4 (four) times daily as needed (dry eyes).   Yes [provider]  cholecalciferol (VITAMIN D3) 25 MCG (1000 UT) tablet Take 1 tablet (1,000 Units total) by mouth daily. 08/28/19  Yes Hennie Duos, MD  dorzolamide-timolol (COSOPT) 22.3-6.8 MG/ML ophthalmic solution Place 1 drop into the right eye 2 (two) times daily. Patient taking differently: Place 1 drop into the right eye in the morning and at bedtime. 08/28/19  Yes Hennie Duos, MD  escitalopram (LEXAPRO) 10 MG tablet Take 10 mg by mouth daily. 12/20/21  Yes [provider]  furosemide (LASIX) 20 MG tablet Take 20 mg by mouth daily.   Yes [provider]  gabapentin (NEURONTIN) 100 MG capsule Take 1 capsule (100 mg total) by mouth 2 (two) times daily. Patient taking differently: Take 100 mg by mouth 3 (three) times daily. 08/28/19  Yes Hennie Duos, MD  hydrochlorothiazide (HYDRODIURIL) 25 MG tablet Take 1 tablet (25 mg total) by mouth daily. 08/28/19  Yes Hennie Duos, MD  JARDIANCE 10 MG TABS tablet Take 10 mg by mouth daily. 04/03/21   Yes [provider]  melatonin 3 MG TABS tablet Take 3 mg by mouth at bedtime.   Yes [provider]  meloxicam (MOBIC) 15 MG tablet Take 1 tablet (15 mg total) by mouth daily. 12/30/19  Yes Edrick Kins, DPM  metFORMIN (GLUCOPHAGE) 1000 MG tablet Take 1 tablet (1,000 mg total) by mouth 2 (two) times daily with a meal. 08/28/19  Yes Hennie Duos, MD  Multiple Vitamin (MULTIVITAMIN) TABS Take 1 tablet by mouth daily with breakfast.   Yes [provider]  olmesartan (BENICAR) 5 MG tablet Take 5 mg by mouth daily. 12/13/22 12/13/23 Yes [provider]  omeprazole (PRILOSEC) 20 MG capsule Take 1 capsule (20 mg total) by mouth daily. Patient taking differently: Take 20 mg by mouth in the morning. 08/28/19  Yes Hennie Duos, MD  ondansetron (ZOFRAN) 4 MG tablet Take 4 mg by mouth daily. 12/11/22  Yes [provider]  potassium chloride SA (KLOR-CON M) 20 MEQ tablet Take 20 mEq by mouth daily.   Yes [provider]  psyllium (REGULOID) 0.52 g capsule Take 0.52 g by mouth daily.   Yes [provider]  ROCKLATAN 0.02-0.005 % SOLN Place 1 drop into both eyes at bedtime. 08/28/19  Yes Hennie Duos, MD  rosuvastatin (CRESTOR) 20 MG tablet Take 1 tablet (20 mg total) by mouth daily. Patient taking differently: Take 20 mg by mouth at bedtime. 08/28/19  Yes Hennie Duos, MD  sitaGLIPtin (JANUVIA) 100 MG tablet Take 1 tablet (100 mg total) by mouth daily. 08/28/19  Yes Hennie Duos, MD  tamsulosin (FLOMAX) 0.4 MG CAPS capsule Take 1 capsule (0.4 mg total) by mouth daily. 08/28/19  Yes Hennie Duos, MD  Emollient (EUCERIN) lotion Apply 1 Application topically as needed for dry skin.    [provider]  ertapenem Colbert Ewing) 1 g injection  12/12/22   [provider]  lidocaine (XYLOCAINE) 1 % (with preservative) injection SMARTSIG:Rectally Patient not taking: Reported on 12/21/2022 12/12/22   [provider]   [place] MISC Please supply patient with shower chair with U-shaped seat. 10/07/19   [provider]  nitrofurantoin, macrocrystal-monohydrate, (MACROBID) 100 MG capsule Take by mouth. Patient not taking: Reported on 12/21/2022 12/12/22   [provider]      Allergies    Patient has no known allergies.    Review of Systems   Review of Systems  Neurological:  Positive for weakness.    Physical Exam Updated Vital Signs BP 117/65   Pulse 84   Temp (!) 97.3 F (36.3 C) (Oral)   Resp (!) 21   SpO2 97%  Physical Exam  ED Results / Procedures / Treatments   Labs (all labs ordered are listed, but only abnormal results are displayed) Labs Reviewed  COMPREHENSIVE METABOLIC PANEL - Abnormal; Notable for the following components:      Result Value   Potassium 2.9 (*)    Glucose, Bld 153 (*)    BUN 24 (*)    Calcium 8.3 (*)    Albumin 3.2 (*)    ALT 54 (*)    Total Bilirubin 1.5 (*)    All other components within normal limits  CBC WITH DIFFERENTIAL/PLATELET - Abnormal; Notable for the following components:   WBC 12.5 (*)    MCH 24.4 (*)    RDW 19.1 (*)    Neutro Abs 11.8 (*)    Lymphs Abs 0.2 (*)    All other components within normal limits  URINALYSIS, ROUTINE W REFLEX MICROSCOPIC - Abnormal; Notable for the following components:   Glucose, UA >=500 (*)    Ketones, ur 5 (*)    Protein, ur 100 (*)    All other components within normal limits  BLOOD GAS, VENOUS - Abnormal; Notable for the following components:   pCO2, Ven 42 (*)    pO2, Ven 61 (*)    Acid-Base Excess 2.2 (*)    All other components within normal limits  CULTURE, BLOOD (ROUTINE X 2)  CULTURE, BLOOD (ROUTINE X 2)  LACTIC ACID, PLASMA  PROTIME-INR    EKG None  Radiology CT Head Wo Contrast  Result Date: 12/21/2022 CLINICAL DATA:  Altered mental status, recent fall EXAM: CT HEAD WITHOUT CONTRAST TECHNIQUE: Contiguous axial images were obtained from the base of the skull through  the vertex without intravenous contrast. RADIATION DOSE REDUCTION: This exam was performed according to the departmental dose-optimization program which includes automated exposure control, adjustment of the mA and/or kV according to patient size and/or use of iterative reconstruction technique. COMPARISON:  Previous studies including the examination done on 12/20/2022 FINDINGS: Brain: There is large area of encephalomalacia in right cerebral hemisphere. Extensive calcifications are noted in right cerebellar hemisphere. Dense dystrophic calcification is seen in falx, meninges and right cerebral hemisphere. These findings have not changed significantly. There are no new foci of bleeding within the cranium. There is no significant focal mass  effect. Vascular: Unremarkable. Skull: There is evidence of right frontoparietal craniotomy. There is remodeling of frontal sinus with marked enlargement of right frontal sinus. These findings suggest longstanding process. Sinuses/Orbits: There is mucosal thickening in ethmoid sinus. Optic globes are symmetrical. Retrobulbar soft tissues are unremarkable. There is exophthalmos on the right side with no significant interval change. Other: No significant interval changes are noted. IMPRESSION: No acute findings are seen. There is a large area of encephalomalacia in right cerebral hemisphere along with extensive dystrophic calcifications residual from remote injury. No significant interval changes are noted since 12/20/2022. Electronically Signed   By: Elmer Picker M.D.   On: 12/21/2022 20:06   DG Chest 2 View  Result Date: 12/21/2022 CLINICAL DATA:  Possible sepsis, initial encounter EXAM: CHEST - 2 VIEW COMPARISON:  12/20/2022 FINDINGS: Cardiac shadow is stable. Lungs are well aerated bilaterally. No focal infiltrate or effusion is seen. No bony abnormality is noted. IMPRESSION: No active cardiopulmonary disease. Electronically Signed   By: Inez Catalina M.D.   On:  12/21/2022 19:17   DG Knee Complete 4 Views Right  Result Date: 12/20/2022 CLINICAL DATA:  Status post fall.  Rule out fracture. EXAM: RIGHT KNEE - COMPLETE 4+ VIEW COMPARISON:  09/22/2022 FINDINGS: No joint effusion. No acute fracture or dislocation. 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 Mild lateral compartment marginal spur formation and sharpening of the tibial spines. IMPRESSION: 1. No acute findings. 2. 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 Electronically Signed   By: Kerby Moors M.D.   On: 12/20/2022 12:20   DG Chest 1 View  Result Date: 12/20/2022 CLINICAL DATA:  Pain after fall EXAM: CHEST  1 VIEW COMPARISON:  None Available. FINDINGS: No consolidation, pneumothorax or effusion. Normal cardiopericardial silhouette without edema. Overlapping cardiac leads. Film is under penetrated. IMPRESSION: No acute cardiopulmonary disease. Electronically Signed   By: Jill Side M.D.   On: 12/20/2022 12:15   CT HEAD WO CONTRAST (5MM)  Result Date: 12/20/2022 CLINICAL DATA:  Head trauma, minor (Age >= 65y); Neck trauma (Age >= 65y) EXAM: CT HEAD WITHOUT CONTRAST CT CERVICAL SPINE WITHOUT CONTRAST TECHNIQUE: Multidetector CT imaging of the head and cervical spine was performed following the standard protocol without intravenous contrast. Multiplanar CT image reconstructions of the cervical spine were also generated. RADIATION DOSE REDUCTION: This exam was performed according to the departmental dose-optimization program which includes automated exposure control, adjustment of the mA and/or kV according to patient size and/or use of iterative reconstruction technique. COMPARISON:  None Available. FINDINGS: CT HEAD FINDINGS Brain: Large remote right cerebral insult with extensive encephalomalacia and dystrophic calcification. No evidence of acute large vascular territory infarct, mass lesion, midline shift or  hydrocephalus. Ex vacuo ventricular dilation on the right. Vascular: No hyperdense vessel identified. Skull: No acute fracture. Sinuses/Orbits: Clear sinuses.  No acute orbital findings. CT CERVICAL SPINE FINDINGS Alignment: Mild stepwise anterolisthesis of C2 on C3 and C3 on C4. Skull base and vertebrae: No evidence of acute fracture. Soft tissues and spinal canal: No prevertebral fluid or swelling. No visible canal hematoma. Disc levels: Multilevel degenerative change including bridging osteophytes and facet/uncovertebral hypertrophy with varying degrees of neural foraminal stenosis. Upper chest: Visualized lung apices are clear. IMPRESSION: CT head: 1. No acute abnormality. 2. Large remote right cerebral insult with extensive encephalomalacia and dystrophic calcification. CT cervical spine: No acute fracture or traumatic malalignment. Electronically Signed   By: Margaretha Sheffield M.D.   On: 12/20/2022  11:05   CT Cervical Spine Wo Contrast  Result Date: 12/20/2022 CLINICAL DATA:  Head trauma, minor (Age >= 65y); Neck trauma (Age >= 65y) EXAM: CT HEAD WITHOUT CONTRAST CT CERVICAL SPINE WITHOUT CONTRAST TECHNIQUE: Multidetector CT imaging of the head and cervical spine was performed following the standard protocol without intravenous contrast. Multiplanar CT image reconstructions of the cervical spine were also generated. RADIATION DOSE REDUCTION: This exam was performed according to the departmental dose-optimization program which includes automated exposure control, adjustment of the mA and/or kV according to patient size and/or use of iterative reconstruction technique. COMPARISON:  None Available. FINDINGS: CT HEAD FINDINGS Brain: Large remote right cerebral insult with extensive encephalomalacia and dystrophic calcification. No evidence of acute large vascular territory infarct, mass lesion, midline shift or hydrocephalus. Ex vacuo ventricular dilation on the right. Vascular: No hyperdense vessel  identified. Skull: No acute fracture. Sinuses/Orbits: Clear sinuses.  No acute orbital findings. CT CERVICAL SPINE FINDINGS Alignment: Mild stepwise anterolisthesis of C2 on C3 and C3 on C4. Skull base and vertebrae: No evidence of acute fracture. Soft tissues and spinal canal: No prevertebral fluid or swelling. No visible canal hematoma. Disc levels: Multilevel degenerative change including bridging osteophytes and facet/uncovertebral hypertrophy with varying degrees of neural foraminal stenosis. Upper chest: Visualized lung apices are clear. IMPRESSION: CT head: 1. No acute abnormality. 2. Large remote right cerebral insult with extensive encephalomalacia and dystrophic calcification. CT cervical spine: No acute fracture or traumatic malalignment. Electronically Signed   By: Margaretha Sheffield M.D.   On: 12/20/2022 11:05    Procedures Procedures  {Document cardiac monitor, telemetry assessment procedure when appropriate:1}  Medications Ordered in ED Medications  sodium chloride 0.9 % bolus 1,000 mL (0 mLs Intravenous Stopped 12/21/22 2117)  sodium chloride 0.9 % bolus 1,000 mL (0 mLs Intravenous Stopped 12/21/22 2220)  potassium chloride 10 mEq in 100 mL IVPB (0 mEq Intravenous Stopped 12/21/22 2220)  potassium chloride SA (KLOR-CON M) CR tablet 40 mEq (40 mEq Oral Given 12/21/22 2220)    ED Course/ Medical Decision Making/ A&P   {   Click here for ABCD2, HEART and other calculatorsREFRESH Note before signing :1}                          Medical Decision Making Amount and/or Complexity of Data Reviewed Labs: ordered. Radiology: ordered.  Risk Prescription drug management.   Patient with hypokalemia.  He will increase his potassium to 40 mEq a day and follow-up as needed  {Document critical care time when appropriate:1} {Document review of labs and clinical decision tools ie heart score, Chads2Vasc2 etc:1}  {Document your independent review of radiology images, and any outside  records:1} {Document your discussion with family members, caretakers, and with consultants:1} {Document social determinants of health affecting pt's care:1} {Document your decision making why or why not admission, treatments were needed:1} Final Clinical Impression(s) / ED Diagnoses Final diagnoses:  Hypokalemia    Rx / DC Orders ED Discharge Orders     None

## 2022-12-21 NOTE — ED Triage Notes (Signed)
Ems called for altered mental status, ems reports pt has been alert and oriented.  Pt seen here for fall yesterday.  Reports pt on antibiotics for past10 days for UTI. Pt is from [facility].

## 2022-12-22 NOTE — ED Notes (Signed)
Transport is here for patient

## 2022-12-26 LAB — CULTURE, BLOOD (ROUTINE X 2)
Culture: NO GROWTH
Culture: NO GROWTH

## 2023-01-29 ENCOUNTER — Ambulatory Visit (INDEPENDENT_AMBULATORY_CARE_PROVIDER_SITE_OTHER): Payer: Medicare Other | Admitting: Podiatry

## 2023-01-29 DIAGNOSIS — L84 Corns and callosities: Secondary | ICD-10-CM | POA: Diagnosis not present

## 2023-01-29 DIAGNOSIS — E1169 Type 2 diabetes mellitus with other specified complication: Secondary | ICD-10-CM

## 2023-01-29 DIAGNOSIS — B351 Tinea unguium: Secondary | ICD-10-CM | POA: Diagnosis not present

## 2023-01-29 DIAGNOSIS — M79674 Pain in right toe(s): Secondary | ICD-10-CM | POA: Diagnosis not present

## 2023-01-29 DIAGNOSIS — M79675 Pain in left toe(s): Secondary | ICD-10-CM | POA: Diagnosis not present

## 2023-01-29 DIAGNOSIS — E785 Hyperlipidemia, unspecified: Secondary | ICD-10-CM | POA: Diagnosis not present

## 2023-02-03 NOTE — Progress Notes (Signed)
  Subjective:  Patient ID: Xavier Turner, male    DOB: November 18, 1955,  MRN: [id number]  Chief Complaint  Patient presents with   Nail Problem    Thick painful toenails, 2 month follow up    67 y.o. male returns with the above complaint. History confirmed with patient.  Here today for at risk diabetic foot care his nails are thickened elongated and causing pain again.  Callus has returned and is painful as well.  Reports no new issues.  Says his blood sugar control is good.  Objective:  Physical Exam: warm, good capillary refill, no trophic changes or ulcerative lesions, normal DP and PT pulses, and abnormal sensory exam.  He has severe pes cavus foot deformity.  Prominent area of tenderness over the fifth metatarsal base plantarly.  Hyperkeratotic painful callus here.  Thickened elongated nails with yellow discoloration and subungual debris of all remaining toenails  Summary:  RIGHT:  - No evidence of common femoral vein obstruction.     LEFT:  - There is no evidence of deep vein thrombosis in the lower extremity.  - There is no evidence of superficial venous thrombosis.     - No cystic structure found in the popliteal fossa.      *See table(s) above for measurements and observations.   Electronically signed by Gerarda Fraction on 10/06/2021 at 4:20:40 PM.   Lab work was unrevealing for gout or infectious process Assessment:   1. Pain due to onychomycosis of toenails of both feet   2. Type 2 diabetes mellitus with hyperlipidemia (HCC)   3. Callus of foot         Plan:  Patient was evaluated and treated and all questions answered.  Patient educated on diabetes. Discussed proper diabetic foot care and discussed risks and complications of disease. Educated patient in depth on reasons to return to the office immediately should he/she discover anything concerning or new on the feet. All questions answered. Discussed proper shoes as well.  He will be scheduled for diabetic shoe fitting  in the future  Discussed the etiology and treatment options for the condition in detail with the patient. Educated patient on the topical and oral treatment options for mycotic nails. Recommended debridement of the nails today. Sharp and mechanical debridement performed of all painful and mycotic nails today. Nails debrided in length and thickness using a nail nipper to level of comfort. Discussed treatment options including appropriate shoe gear. Follow up as needed for painful nails.  All symptomatic hyperkeratoses were safely debrided with a sterile #15 blade to patient's level of comfort without incident. We discussed preventative and palliative care of these lesions including supportive and accommodative shoegear, padding, prefabricated and custom molded accommodative orthoses, use of a pumice stone and lotions/creams daily.  No follow-ups on file.

## 2023-02-27 ENCOUNTER — Ambulatory Visit: Payer: Medicare Other | Admitting: Gastroenterology

## 2023-03-06 ENCOUNTER — Telehealth: Payer: Self-pay | Admitting: *Deleted

## 2023-03-06 ENCOUNTER — Ambulatory Visit (INDEPENDENT_AMBULATORY_CARE_PROVIDER_SITE_OTHER): Payer: Medicare Other | Admitting: Gastroenterology

## 2023-03-06 ENCOUNTER — Encounter: Payer: Self-pay | Admitting: Gastroenterology

## 2023-03-06 VITALS — BP 109/69 | HR 78 | Temp 97.7°F | Wt 189.0 lb

## 2023-03-06 DIAGNOSIS — Z1211 Encounter for screening for malignant neoplasm of colon: Secondary | ICD-10-CM | POA: Diagnosis not present

## 2023-03-06 DIAGNOSIS — N189 Chronic kidney disease, unspecified: Secondary | ICD-10-CM

## 2023-03-06 DIAGNOSIS — D631 Anemia in chronic kidney disease: Secondary | ICD-10-CM

## 2023-03-06 DIAGNOSIS — K625 Hemorrhage of anus and rectum: Secondary | ICD-10-CM

## 2023-03-06 NOTE — Telephone Encounter (Signed)
LMOVM for Belinda from [facility] to return call to schedule colonoscopy for pt.  TCS, ASA 3, Dr.Carver  Spoke with POA, [name] (pt's sister) and she said to go ahead and schedule the procedure and she would talk to him today to see if he is willing to go through with it.

## 2023-03-06 NOTE — Patient Instructions (Signed)
We are scheduling for a colonoscopy in the near future with Dr. Marletta Lor.  We will reach out to [facility]) to discuss consent for procedure.  Separate written instructions regarding colonoscopy prep and medication adjustments will be provided.  Please begin Preparation H twice daily for 1 week applied internally.  Will plan to follow-up in 3 months, sooner if needed.  It was a pleasure to see you today. I want to create trusting relationships with patients. If you receive a survey regarding your visit,  I greatly appreciate you taking time to fill this out on paper or through your MyChart. I value your feedback.  Brooke Bonito, MSN, FNP-BC, AGACNP-BC [facility]

## 2023-03-06 NOTE — Progress Notes (Signed)
GI Office Note    Referring Provider: Lindaann Pascal Primary Care Physician:  System, Provider Not In  Primary Gastroenterologist: Hennie Duos. Marletta Lor, DO  Chief Complaint   Chief Complaint  Patient presents with   Rectal Bleeding    Having some bleeding after he has a bowel movement.   History of Present Illness   Xavier Turner is a 67 y.o. male presenting today at the request of [place], Christella Hartigan for rectal bleeding.   Labs 12/2413/24: Hemoglobin 12.4, MCV 84, platelets 218, creatinine 0.81, glucose 179, GFR 97, triglycerides 471, HDL 32 Labs 02/13/23: Hemoglobin 12.3, MCV 83, platelets 208, A1c 6.9  Per referral paperwork it appears that patient reported on 5/21 that he had some blood that presented after a bowel movement.  He was evaluated by the nurse practitioner who advised CBC, GI consult, and was ordered Carafate 1 g by mouth every 8 hours for 6 weeks.  Per review of chart it appears he had a colonoscopy in 2006 with Dr. Leone Payor which was normal.  About an hour and a half after procedure he developed some abdominal discomfort and had some air trapping for which scope was reinserted and colon Decompressed.  He was recommended to have a colonoscopy in 10 years given he has a second-degree relative with colon cancer.  Appears that he was seen with a provider in the [facility] system in 2013 and was advised to schedule a colonoscopy as he had an occult positive stool at that time.  It does not appear that he had a colonoscopy after this office visit unless it is not documented or available in Care Everywhere.  Echocardiogram in January 2024 with EF 60 to 65%.  Today: States he had a lot of bright red blood the first time and then had a second bowel movement he had a little. He states his first BM it was normal looking stool and the second time he had some looser stools but had a little blood present both times. He said the next night he had another BM and had a little blood then as well.  He has had little twinges of abdominal pain. Weight has been stable. He has a great appetite. He at times does have some rectal discomfort.   Denies any chest pain, shortness of breath, fatigue, dizziness.   He states his father previously had some rectal bleeding as well. He is unaware of any family history of colon cancer.   Current Outpatient Medications  Medication Sig Dispense Refill   aspirin 81 MG chewable tablet Chew 81 mg by mouth daily.     brimonidine (ALPHAGAN) 0.2 % ophthalmic solution Place 1 drop into both eyes 3 (three) times daily. 5 mL 0   carboxymethylcellulose (REFRESH PLUS) 0.5 % SOLN Place 1 drop into both eyes 4 (four) times daily as needed (dry eyes).     cholecalciferol (VITAMIN D3) 25 MCG (1000 UT) tablet Take 1 tablet (1,000 Units total) by mouth daily. 30 tablet 0   dorzolamide-timolol (COSOPT) 22.3-6.8 MG/ML ophthalmic solution Place 1 drop into the right eye 2 (two) times daily. (Patient taking differently: Place 1 drop into the right eye in the morning and at bedtime.) 10 mL 0   Emollient (EUCERIN) lotion Apply 1 Application topically as needed for dry skin.     escitalopram (LEXAPRO) 10 MG tablet Take 10 mg by mouth daily.     furosemide (LASIX) 20 MG tablet Take 20 mg by mouth daily.     gabapentin (  NEURONTIN) 100 MG capsule Take 1 capsule (100 mg total) by mouth 2 (two) times daily. (Patient taking differently: Take 100 mg by mouth 3 (three) times daily.) 60 capsule 0   hydrochlorothiazide (HYDRODIURIL) 25 MG tablet Take 1 tablet (25 mg total) by mouth daily. 30 tablet 0   JARDIANCE 10 MG TABS tablet Take 10 mg by mouth daily.     melatonin 3 MG TABS tablet Take 3 mg by mouth at bedtime.     meloxicam (MOBIC) 15 MG tablet Take 1 tablet (15 mg total) by mouth daily. 30 tablet 1   metFORMIN (GLUCOPHAGE) 1000 MG tablet Take 1 tablet (1,000 mg total) by mouth 2 (two) times daily with a meal. 60 tablet 0   Misc. Devices MISC Please supply patient with shower chair  with U-shaped seat.     Multiple Vitamin (MULTIVITAMIN) TABS Take 1 tablet by mouth daily with breakfast.     olmesartan (BENICAR) 5 MG tablet Take 5 mg by mouth daily.     ondansetron (ZOFRAN) 4 MG tablet Take 4 mg by mouth daily.     potassium chloride SA (KLOR-CON M) 20 MEQ tablet Take 20 mEq by mouth daily.     psyllium (REGULOID) 0.52 g capsule Take 0.52 g by mouth daily.     ROCKLATAN 0.02-0.005 % SOLN Place 1 drop into both eyes at bedtime. 2.5 mL 0   rosuvastatin (CRESTOR) 20 MG tablet Take 1 tablet (20 mg total) by mouth daily. (Patient taking differently: Take 20 mg by mouth at bedtime.) 30 tablet 0   sitaGLIPtin (JANUVIA) 100 MG tablet Take 1 tablet (100 mg total) by mouth daily. 30 tablet 0   tamsulosin (FLOMAX) 0.4 MG CAPS capsule Take 1 capsule (0.4 mg total) by mouth daily. 30 capsule 0   No current facility-administered medications for this visit.    Past Medical History:  Diagnosis Date   Diabetes (HCC)    Glaucoma    Hemiparesis (HCC)    Hypercholesterolemia    Hyperlipemia    Melanoma (HCC)    Melanoma (HCC)    Obesity    OSA (obstructive sleep apnea)    Seizure (HCC)    Seizure disorder (HCC)    Sturge syndrome (HCC)    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HCC)     Past Surgical History:  Procedure Laterality Date   AMPUTATION Left 07/24/2019   Procedure: AMPUTATION RAY, fifth left;  Surgeon: Nadara Mustard, MD;  Location: [facility] OR;  Service: Orthopedics;  Laterality: Left;   COLONOSCOPY     CRANIOTOMY  1999   melanoma removal  2011    Family History  Problem Relation Age of Onset   Emphysema Father    Cancer Father    Asthma Mother     Allergies as of 03/06/2023 - Review Complete 03/06/2023  Allergen Reaction Noted   Penicillin g Anaphylaxis 03/06/2023    Social History   Socioeconomic History   Marital status: Single    Spouse name: Not on file   Number of children: Not on file   Years of education: Not on file   Highest education level: Not on file   Occupational History   Occupation: [employer]    Comment: resides in a group home([facility])  Tobacco Use   Smoking status: Never   Smokeless tobacco: Never  Vaping Use   Vaping Use: Never used  Substance and Sexual Activity   Alcohol use: Not Currently   Drug use: Not Currently   Sexual activity: Not Currently  Other Topics Concern   Not on file  Social History Narrative   Single.  Employed by [employer].  Never smoker. Has legal guardian.    Social Determinants of Health   Financial Resource Strain: Not on file  Food Insecurity: Not on file  Transportation Needs: Not on file  Physical Activity: Not on file  Stress: Not on file  Social Connections: Not on file  Intimate Partner Violence: Not on file   Review of Systems   Gen: Denies any fever, chills, fatigue, weight loss, lack of appetite.  CV: Denies chest pain, heart palpitations, peripheral edema, syncope.  Resp: Denies shortness of breath at rest or with exertion. Denies wheezing or cough.  GI: see HPI GU : Denies urinary burning, urinary frequency, urinary hesitancy MS: Denies joint pain, muscle weakness, cramps, or limitation of movement.  Derm: Denies rash, itching, dry skin Psych: Denies depression, anxiety, memory loss, and confusion Heme: Denies bruising, bleeding, and enlarged lymph nodes.  Physical Exam   BP 109/69 (BP Location: Right Arm, Patient Position: Sitting, Cuff Size: Normal)   Pulse 78   Temp 97.7 F (36.5 C) (Temporal)   Wt 189 lb (85.7 kg)   SpO2 94%   BMI 31.45 kg/m   General:   Alert and oriented. Pleasant and cooperative. Well-nourished and well-developed.  Head:  Normocephalic and atraumatic. Eyes:  Without icterus, sclera clear and conjunctiva pink.  Ears:  Normal auditory acuity. Mouth:  No deformity or lesions, oral mucosa pink.  Lungs:  Clear to auscultation bilaterally. No wheezes, rales, or rhonchi. No distress.  Heart:  S1, S2 present without murmurs appreciated.   Abdomen:  +BS, soft, non-tender and non-distended. No HSM noted. No guarding or rebound. No masses appreciated. Rectal: Good tone.  Evidence of hemorrhoids to right posterior column and possibly right anterior column on DRE.  No external hemorrhoids.  No evidence of mass. Msk:  Symmetrical without gross deformities. Normal posture. Extremities:  Without edema. Neurologic:  Alert and  oriented x4;  grossly normal neurologically. Skin:  Intact without significant lesions or rashes. Psych:  Alert and cooperative. Normal mood and affect.   Assessment   Xavier Turner is a 67 y.o. male with a history of diabetes, HLD, PVD, anemia of chronic disease, CKD stage I, glaucoma, depression, epilepsy, OSA, GERD, Sturge-Weber syndrome and BPH presenting today for evaluation of rectal bleeding.  Normocytic anemia, rectal bleeding: Most recent labs with mild normocytic anemia.  Could be multifactorial in the setting of chronic kidney disease as well as recent mild rectal bleeding.  He has had a couple instances of rectal bleeding with bowel movements.  He denies any overt constipation or diarrhea.  Rectal exam today without any mass, the tone, POC FOBT in office positive.  Likely hemorrhoids to right posterior column and some discomfort on rectal exam today.  He denies any overt abdominal pain, constipation, lack of appetite, early satiety, or unintentional weight loss.  Given that he is also due for screening for colon cancer purposes we will go ahead and schedule a colonoscopy for further evaluation of rectal bleeding.  Screening for colon cancer: Last colonoscopy normal in 2006.  Prior notes states family history of colon cancer in second-degree relative however patient reports no family history that he is aware of.  He is well overdue for colon cancer screening.   PLAN   Proceed with colonoscopy with propofol by Dr. Marletta Lor  in near future: the risks, benefits, and alternatives have been discussed with the  patient in detail. The patient states understanding and desires to proceed. ASA 3 (Will reach out to [facility] for consent) Trilyte prep Hold metformin night prior to morning of procedure Hold Jardiance for 3 days Hold Januvia morning of procedure.  POC FOBT Use Preparation H twice daily for 1 week. Follow up 3 months   Brooke Bonito, MSN, FNP-BC, AGACNP-BC [facility]

## 2023-03-07 ENCOUNTER — Encounter: Payer: Self-pay | Admitting: *Deleted

## 2023-03-07 ENCOUNTER — Other Ambulatory Visit: Payer: Self-pay | Admitting: *Deleted

## 2023-03-07 MED ORDER — PEG 3350-KCL-NA BICARB-NACL 420 G PO SOLR: 4000.0000 mL | Freq: Once | ORAL | 0 refills | Status: AC

## 2023-03-07 NOTE — Telephone Encounter (Signed)
Pt has been scheduled for 04/08/23. Instructions faxed to Brion Aliment at [facility]. Prep sent to the pharmacy

## 2023-04-03 NOTE — Patient Instructions (Signed)
Xavier Turner  04/03/2023     @PREFPERIOPPHARMACY @   Your procedure is scheduled on  04/08/2023.   Report [facility] at  [phone]  A.M.   Call this number if you have problems the morning of surgery:  [phone]  If you experience any cold or flu symptoms such as cough, fever, chills, shortness of breath, etc. between now and your scheduled surgery, please notify us at the above number.   Remember:  Follow the diet and prep instructions given to you by the office.     Your last dose of jardiance should be on 04/04/2023 and your last dose of januvia should be on 04/06/2023.      DO NOT take any metformin the night before your procedure.      DO NOT take any medications for diabetes the morning of your procedure.     Take these medicines the morning of surgery with A SIP OF WATER          lexapro, gabapentin, meloxicam (if needed), zofran (if needed), flomax.     Do not wear jewelry, make-up or nail polish, including gel polish,  artificial nails, or any other type of covering on natural nails (fingers and  toes).  Do not wear lotions, powders, or perfumes, or deodorant.  Do not shave 48 hours prior to surgery.  Men may shave face and neck.  Do not bring valuables to the hospital.  [facility] is not responsible for any belongings or valuables.  Contacts, dentures or bridgework may not be worn into surgery.  Leave your suitcase in the car.  After surgery it may be brought to your room.  For patients admitted to the hospital, discharge time will be determined by your treatment team.  Patients discharged the day of surgery will not be allowed to drive home and must have someone with them for 24 hours.    Special instructions:   DO NOT smoke tobacco or vape for 24 hours before your procedure.  Please read over the following fact sheets that you were given. Anesthesia Post-op Instructions and Care and Recovery After Surgery      Colonoscopy, Adult, Care  After The following information offers guidance on how to care for yourself after your procedure. Your health care provider may also give you more specific instructions. If you have problems or questions, contact your health care provider. What can I expect after the procedure? After the procedure, it is common to have: A small amount of blood in your stool for 24 hours after the procedure. Some gas. Mild cramping or bloating of your abdomen. Follow these instructions at home: Eating and drinking  Drink enough fluid to keep your urine pale yellow. Follow instructions from your health care provider about eating or drinking restrictions. Resume your normal diet as told by your health care provider. Avoid heavy or fried foods that are hard to digest. Activity Rest as told by your health care provider. Avoid sitting for a long time without moving. Get up to take short walks every 1-2 hours. This is important to improve blood flow and breathing. Ask for help if you feel weak or unsteady. Return to your normal activities as told by your health care provider. Ask your health care provider what activities are safe for you. Managing cramping and bloating  Try walking around when you have cramps or feel bloated. If directed, apply heat to your abdomen as told by your health care  provider. Use the heat source that your health care provider recommends, such as a moist heat pack or a heating pad. Place a towel between your skin and the heat source. Leave the heat on for 20-30 minutes. Remove the heat if your skin turns bright red. This is especially important if you are unable to feel pain, heat, or cold. You have a greater risk of getting burned. General instructions If you were given a sedative during the procedure, it can affect you for several hours. Do not drive or operate machinery until your health care provider says that it is safe. For the first 24 hours after the procedure: Do not sign  important documents. Do not drink alcohol. Do your regular daily activities at a slower pace than normal. Eat soft foods that are easy to digest. Take over-the-counter and prescription medicines only as told by your health care provider. Keep all follow-up visits. This is important. Contact a health care provider if: You have blood in your stool 2-3 days after the procedure. Get help right away if: You have more than a small spotting of blood in your stool. You have large blood clots in your stool. You have swelling of your abdomen. You have nausea or vomiting. You have a fever. You have increasing pain in your abdomen that is not relieved with medicine. These symptoms may be an emergency. Get help right away. Call 911. Do not wait to see if the symptoms will go away. Do not drive yourself to the hospital. Summary After the procedure, it is common to have a small amount of blood in your stool. You may also have mild cramping and bloating of your abdomen. If you were given a sedative during the procedure, it can affect you for several hours. Do not drive or operate machinery until your health care provider says that it is safe. Get help right away if you have a lot of blood in your stool, nausea or vomiting, a fever, or increased pain in your abdomen. This information is not intended to replace advice given to you by your health care provider. Make sure you discuss any questions you have with your health care provider. Document Revised: 10/23/2022 Document Reviewed: 05/03/2021 Elsevier Patient Education  2024 Else[employer] Inc. Monitored Anesthesia Care, Care After The following information offers guidance on how to care for yourself after your procedure. Your health care provider may also give you more specific instructions. If you have problems or questions, contact your health care provider. What can I expect after the procedure? After the procedure, it is common to  have: Tiredness. Little or no memory about what happened during or after the procedure. Impaired judgment when it comes to making decisions. Nausea or vomiting. Some trouble with balance. Follow these instructions at home: For the time period you were told by your health care provider:  Rest. Do not participate in activities where you could fall or become injured. Do not drive or use machinery. Do not drink alcohol. Do not take sleeping pills or medicines that cause drowsiness. Do not make important decisions or sign legal documents. Do not take care of children on your own. Medicines Take over-the-counter and prescription medicines only as told by your health care provider. If you were prescribed antibiotics, take them as told by your health care provider. Do not stop using the antibiotic even if you start to feel better. Eating and drinking Follow instructions from your health care provider about what you may eat and drink. Drink  enough fluid to keep your urine pale yellow. If you vomit: Drink clear fluids slowly and in small amounts as you are able. Clear fluids include water, ice chips, low-calorie sports drinks, and fruit juice that has water added to it (diluted fruit juice). Eat light and bland foods in small amounts as you are able. These foods include bananas, applesauce, rice, lean meats, toast, and crackers. General instructions  Have a responsible adult stay with you for the time you are told. It is important to have someone help care for you until you are awake and alert. If you have sleep apnea, surgery and some medicines can increase your risk for breathing problems. Follow instructions from your health care provider about wearing your sleep device: When you are sleeping. This includes during daytime naps. While taking prescription pain medicines, sleeping medicines, or medicines that make you drowsy. Do not use any products that contain nicotine or tobacco. These  products include cigarettes, chewing tobacco, and vaping devices, such as e-cigarettes. If you need help quitting, ask your health care provider. Contact a health care provider if: You feel nauseous or vomit every time you eat or drink. You feel light-headed. You are still sleepy or having trouble with balance after 24 hours. You get a rash. You have a fever. You have redness or swelling around the IV site. Get help right away if: You have trouble breathing. You have new confusion after you get home. These symptoms may be an emergency. Get help right away. Call 911. Do not wait to see if the symptoms will go away. Do not drive yourself to the hospital. This information is not intended to replace advice given to you by your health care provider. Make sure you discuss any questions you have with your health care provider. Document Revised: 02/05/2022 Document Reviewed: 02/05/2022 Elsevier Patient Education  2024 [employer].

## 2023-04-04 ENCOUNTER — Encounter (HOSPITAL_COMMUNITY)
Admission: RE | Admit: 2023-04-04 | Discharge: 2023-04-04 | Disposition: A | Discharge status: Home / Self Care | Payer: Medicare Other | Source: Ambulatory Visit | Attending: Internal Medicine | Admitting: Internal Medicine

## 2023-04-04 ENCOUNTER — Encounter (HOSPITAL_COMMUNITY): Payer: Self-pay

## 2023-04-04 VITALS — BP 147/88 | HR 66 | Temp 97.8°F | Resp 18 | Ht 65.0 in | Wt 188.9 lb

## 2023-04-04 DIAGNOSIS — E1169 Type 2 diabetes mellitus with other specified complication: Secondary | ICD-10-CM | POA: Diagnosis not present

## 2023-04-04 DIAGNOSIS — D509 Iron deficiency anemia, unspecified: Secondary | ICD-10-CM | POA: Diagnosis not present

## 2023-04-04 DIAGNOSIS — Z01818 Encounter for other preprocedural examination: Secondary | ICD-10-CM | POA: Insufficient documentation

## 2023-04-04 DIAGNOSIS — I152 Hypertension secondary to endocrine disorders: Secondary | ICD-10-CM | POA: Insufficient documentation

## 2023-04-04 DIAGNOSIS — E785 Hyperlipidemia, unspecified: Secondary | ICD-10-CM | POA: Diagnosis not present

## 2023-04-04 DIAGNOSIS — E1159 Type 2 diabetes mellitus with other circulatory complications: Secondary | ICD-10-CM | POA: Diagnosis not present

## 2023-04-04 HISTORY — DX: Anemia, unspecified: D64.9

## 2023-04-04 HISTORY — DX: Chronic kidney disease, unspecified: N18.9

## 2023-04-04 HISTORY — DX: Unspecified asthma, uncomplicated: J45.909

## 2023-04-04 LAB — BASIC METABOLIC PANEL
Anion gap: 7 (ref 5–15)
BUN: 40 mg/dL — ABNORMAL HIGH (ref 8–23)
CO2: 29 mmol/L (ref 22–32)
Calcium: 9.2 mg/dL (ref 8.9–10.3)
Chloride: 96 mmol/L — ABNORMAL LOW (ref 98–111)
Creatinine, Ser: 1.09 mg/dL (ref 0.61–1.24)
GFR, Estimated: >60 mL/min (ref >60)
Glucose, Bld: 121 mg/dL — ABNORMAL HIGH (ref 70–99)
Potassium: 3.8 mmol/L (ref 3.5–5.1)
Sodium: 132 mmol/L — ABNORMAL LOW (ref 135–145)

## 2023-04-04 LAB — CBC WITH DIFFERENTIAL/PLATELET
Abs Immature Granulocytes: 0.02 10*3/uL (ref 0.00–0.07)
Basophils Absolute: 0 10*3/uL (ref 0.0–0.1)
Basophils Relative: 0 %
Eosinophils Absolute: 0.3 10*3/uL (ref 0.0–0.5)
Eosinophils Relative: 3 %
HCT: 36.3 % — ABNORMAL LOW (ref 39.0–52.0)
Hemoglobin: 11.4 g/dL — ABNORMAL LOW (ref 13.0–17.0)
Immature Granulocytes: 0 %
Lymphocytes Relative: 18 %
Lymphs Abs: 1.5 10*3/uL (ref 0.7–4.0)
MCH: 27.5 pg (ref 26.0–34.0)
MCHC: 31.4 g/dL (ref 30.0–36.0)
MCV: 87.7 fL (ref 80.0–100.0)
Monocytes Absolute: 0.7 10*3/uL (ref 0.1–1.0)
Monocytes Relative: 8 %
Neutro Abs: 5.9 10*3/uL (ref 1.7–7.7)
Neutrophils Relative %: 71 %
Platelets: 207 10*3/uL (ref 150–400)
RBC: 4.14 MIL/uL — ABNORMAL LOW (ref 4.22–5.81)
RDW: 16.9 % — ABNORMAL HIGH (ref 11.5–15.5)
WBC: 8.4 10*3/uL (ref 4.0–10.5)
nRBC: 0 % (ref 0.0–0.2)

## 2023-04-08 ENCOUNTER — Ambulatory Visit (HOSPITAL_COMMUNITY): Payer: Medicare Other | Admitting: Anesthesiology

## 2023-04-08 ENCOUNTER — Ambulatory Visit (HOSPITAL_COMMUNITY)
Admission: RE | Admit: 2023-04-08 | Discharge: 2023-04-08 | Disposition: A | Discharge status: Home / Self Care | Payer: Medicare Other | Attending: Internal Medicine | Admitting: Internal Medicine

## 2023-04-08 ENCOUNTER — Encounter (HOSPITAL_COMMUNITY): Payer: Self-pay

## 2023-04-08 ENCOUNTER — Encounter (HOSPITAL_COMMUNITY)
Admission: RE | Disposition: A | Discharge status: Home / Self Care | Payer: Self-pay | Source: Home / Self Care | Attending: Internal Medicine

## 2023-04-08 DIAGNOSIS — E7800 Pure hypercholesterolemia, unspecified: Secondary | ICD-10-CM | POA: Diagnosis not present

## 2023-04-08 DIAGNOSIS — I10 Essential (primary) hypertension: Secondary | ICD-10-CM | POA: Insufficient documentation

## 2023-04-08 DIAGNOSIS — R195 Other fecal abnormalities: Secondary | ICD-10-CM | POA: Diagnosis not present

## 2023-04-08 DIAGNOSIS — K219 Gastro-esophageal reflux disease without esophagitis: Secondary | ICD-10-CM | POA: Diagnosis not present

## 2023-04-08 DIAGNOSIS — E119 Type 2 diabetes mellitus without complications: Secondary | ICD-10-CM | POA: Insufficient documentation

## 2023-04-08 DIAGNOSIS — K625 Hemorrhage of anus and rectum: Secondary | ICD-10-CM

## 2023-04-08 DIAGNOSIS — G4733 Obstructive sleep apnea (adult) (pediatric): Secondary | ICD-10-CM | POA: Diagnosis not present

## 2023-04-08 DIAGNOSIS — Z1211 Encounter for screening for malignant neoplasm of colon: Secondary | ICD-10-CM | POA: Diagnosis present

## 2023-04-08 DIAGNOSIS — F32A Depression, unspecified: Secondary | ICD-10-CM | POA: Diagnosis not present

## 2023-04-08 DIAGNOSIS — Z7984 Long term (current) use of oral hypoglycemic drugs: Secondary | ICD-10-CM | POA: Diagnosis not present

## 2023-04-08 HISTORY — PX: FLEXIBLE SIGMOIDOSCOPY: SHX5431

## 2023-04-08 LAB — GLUCOSE, CAPILLARY: Glucose-Capillary: 135 mg/dL — ABNORMAL HIGH (ref 70–99)

## 2023-04-08 SURGERY — SIGMOIDOSCOPY, FLEXIBLE
Anesthesia: General

## 2023-04-08 MED ORDER — LACTATED RINGERS IV SOLN: INTRAVENOUS | Status: DC

## 2023-04-08 MED ORDER — PROPOFOL 500 MG/50ML IV EMUL
INTRAVENOUS | Status: DC | PRN
  Administered 2023-04-08: 150 ug/kg/min via INTRAVENOUS

## 2023-04-08 MED ORDER — PROPOFOL 10 MG/ML IV BOLUS
INTRAVENOUS | Status: DC | PRN
  Administered 2023-04-08: 50 mg via INTRAVENOUS

## 2023-04-08 NOTE — Op Note (Signed)
[facility] Patient Name: Xavier Turner Procedure Date: 04/08/2023 8:40 AM MRN: [id number] Date of Birth: 1955-09-28 Attending MD: Hennie Duos. Marletta Lor , [place], [id number] CSN: [id number] Age: 67 Admit Type: Outpatient Procedure:                Colonoscopy Indications:              Screening for colorectal malignant neoplasm Providers:                Hennie Duos. Marletta Lor, DO, Angelica Ran, Kristine L.                            Jessee Avers, Technician Referring MD:              Medicines:                See the Anesthesia note for documentation of the                            administered medications Complications:            No immediate complications. Estimated Blood Loss:     Estimated blood loss: none. Procedure:                Pre-Anesthesia Assessment:                           - The anesthesia plan was to use monitored                            anesthesia care (MAC).                           After obtaining informed consent, the colonoscope                            was passed under direct vision. Throughout the                            procedure, the patient's blood pressure, pulse, and                            oxygen saturations were monitored continuously. The                            [id number]-HQ190L ([id number]) scope was introduced through                            the anus with the intention of advancing to the                            cecum. The scope was advanced to the sigmoid colon                            before the procedure was aborted. Medications were                            given. The  colonoscopy was performed without                            difficulty. The patient tolerated the procedure                            well. The quality of the bowel preparation was                            evaluated using the BBPS [facility] Bowel Preparation                            Scale) with scores of: Left Colon = 0 (unprepared,                            mucosa not seen  due to solid stool that cannot be                            cleared or unseen proximal colon segment in a                            colonoscopy aborted due to inadequate bowel prep).                            The total BBPS score equals 0. The quality of the                            bowel preparation was inadequate. Scope In: 8:55:17 AM Scope Out: 8:56:04 AM Total Procedure Duration: 0 hours 0 minutes 47 seconds  Findings:      A large amount of solid stool was found in the rectum and in the sigmoid       colon, precluding visualization. Impression:               - Preparation of the colon was inadequate.                           - Stool in the rectum and in the sigmoid colon.                           - No specimens collected. Moderate Sedation:      Per Anesthesia Care Recommendation:           - Patient has a contact number available for                            emergencies. The signs and symptoms of potential                            delayed complications were discussed with the                            patient. Return to normal activities tomorrow.  Written discharge instructions were provided to the                            patient.                           - Resume previous diet.                           - Continue present medications.                           - Repeat colonoscopy in 3 months because the bowel                            preparation was poor. Procedure Code(s):        --- Professional ---                           U0454, 53, Colorectal cancer screening; colonoscopy                            on individual not meeting criteria for high risk Diagnosis Code(s):        --- Professional ---                           Z12.11, Encounter for screening for malignant                            neoplasm of colon CPT copyright 2022 American Medical Association. All rights reserved. The codes documented in this report are  preliminary and upon coder review may  be revised to meet current compliance requirements. Hennie Duos. Marletta Lor, DO Hennie Duos. Marletta Lor, DO 04/08/2023 8:58:36 AM This report has been signed electronically. Number of Addenda: 0

## 2023-04-08 NOTE — Transfer of Care (Signed)
Immediate Anesthesia Transfer of Care Note  Patient: Xavier Turner  Procedure(s) Performed: FLEXIBLE SIGMOIDOSCOPY  Patient Location: PACU  Anesthesia Type:General  Level of Consciousness: awake, alert , oriented, and patient cooperative  Airway & Oxygen Therapy: Patient Spontanous Breathing  Post-op Assessment: Report given to RN, Post -op Vital signs reviewed and stable, and Patient moving all extremities X 4  Post vital signs: Reviewed and stable  Last Vitals:  Vitals Value Taken Time  BP 82/46 04/08/23 0905  Temp 36.6 C 04/08/23 0905  Pulse 68 04/08/23 0905  Resp 13 04/08/23 0905  SpO2 95 % 04/08/23 0905    Last Pain:  Vitals:   04/08/23 0905  TempSrc: Oral  PainSc: 0-No pain         Complications: No notable events documented.

## 2023-04-08 NOTE — Anesthesia Preprocedure Evaluation (Addendum)
Anesthesia Evaluation  Patient identified by MRN, date of birth, ID band Patient awake    Reviewed: Allergy & Precautions, H&P , NPO status , Patient's Chart, lab work & pertinent test results  Airway Mallampati: III  TM Distance: >3 FB Neck ROM: Full    Dental  (+) Dental Advisory Given, Missing, Poor Dentition   Pulmonary asthma , sleep apnea    Pulmonary exam normal breath sounds clear to auscultation       Cardiovascular Exercise Tolerance: Poor hypertension, Pt. on medications Normal cardiovascular exam Rhythm:Regular Rate:Normal     Neuro/Psych Seizures -, Well Controlled,  PSYCHIATRIC DISORDERS  Depression    Craniotomy   Neuromuscular disease    GI/Hepatic Neg liver ROS,GERD  Medicated,,  Endo/Other  diabetes, Well Controlled, Type 2, Oral Hypoglycemic Agents    Renal/GU Renal hypertensionRenal disease  negative genitourinary   Musculoskeletal negative musculoskeletal ROS (+)    Abdominal   Peds negative pediatric ROS (+)  Hematology  (+) Blood dyscrasia, anemia   Anesthesia Other Findings Sturge- weber syndrome  Reproductive/Obstetrics negative OB ROS                             Anesthesia Physical Anesthesia Plan  ASA: 3  Anesthesia Plan: General   Post-op Pain Management: Minimal or no pain anticipated   Induction: Intravenous  PONV Risk Score and Plan: 1 and Propofol infusion  Airway Management Planned: Nasal Cannula, Natural Airway and Simple Face Mask  Additional Equipment:   Intra-op Plan:   Post-operative Plan:   Informed Consent: I have reviewed the patients History and Physical, chart, labs and discussed the procedure including the risks, benefits and alternatives for the proposed anesthesia with the patient or authorized representative who has indicated his/her understanding and acceptance.     Dental advisory given  Plan Discussed with: CRNA and  Surgeon  Anesthesia Plan Comments: (Discussed with patient regarding blood product refusal, refused all blood products, agreed to get albumin iv as a life saving measure.)       Anesthesia Quick Evaluation

## 2023-04-08 NOTE — Discharge Instructions (Addendum)
  Colonoscopy Discharge Instructions  Read the instructions outlined below and refer to this sheet in the next few weeks. These discharge instructions provide you with general information on caring for yourself after you leave the hospital. Your doctor may also give you specific instructions. While your treatment has been planned according to the most current medical practices available, unavoidable complications occasionally occur.   ACTIVITY You may resume your regular activity, but move at a slower pace for the next 24 hours.  Take frequent rest periods for the next 24 hours.  Walking will help get rid of the air and reduce the bloated feeling in your belly (abdomen).  No driving for 24 hours (because of the medicine (anesthesia) used during the test).   Do not sign any important legal documents or operate any machinery for 24 hours (because of the anesthesia used during the test).  NUTRITION Drink plenty of fluids.  You may resume your normal diet as instructed by your doctor.  Begin with a light meal and progress to your normal diet. Heavy or fried foods are harder to digest and may make you feel sick to your stomach (nauseated).  Avoid alcoholic beverages for 24 hours or as instructed.  MEDICATIONS You may resume your normal medications unless your doctor tells you otherwise.  WHAT YOU CAN EXPECT TODAY Some feelings of bloating in the abdomen.  Passage of more gas than usual.  Spotting of blood in your stool or on the toilet paper.  IF YOU HAD POLYPS REMOVED DURING THE COLONOSCOPY: No aspirin products for 7 days or as instructed.  No alcohol for 7 days or as instructed.  Eat a soft diet for the next 24 hours.  FINDING OUT THE RESULTS OF YOUR TEST Not all test results are available during your visit. If your test results are not back during the visit, make an appointment with your caregiver to find out the results. Do not assume everything is normal if you have not heard from your  caregiver or the medical facility. It is important for you to follow up on all of your test results.  SEEK IMMEDIATE MEDICAL ATTENTION IF: You have more than a spotting of blood in your stool.  Your belly is swollen (abdominal distention).  You are nauseated or vomiting.  You have a temperature over 101.  You have abdominal pain or discomfort that is severe or gets worse throughout the day.   Unfortunately, your colon was not adequately prepped today for colonoscopy.  Would recommend repeat colonoscopy in 3-6 months with a different colon prep. Follow up in GI office in 2-3 months to discuss further   I hope you have a great rest of your week!  Hennie Duos. Marletta Lor, D.O. Gastroenterology and Hepatology [facility]

## 2023-04-08 NOTE — H&P (Signed)
Primary Care Physician:  System, Provider Not In Primary Gastroenterologist:  Dr. Marletta Lor  Pre-Procedure History & Physical: HPI:  Xavier Turner is a 67 y.o. male is here for a colonoscopy for colon cancer screening purposes.   Past Medical History:  Diagnosis Date   Anemia    Asthma    Chronic kidney disease    Diabetes (HCC)    Glaucoma    Hemiparesis (HCC)    Hypercholesterolemia    Hyperlipemia    Melanoma (HCC)    Melanoma (HCC)    Obesity    OSA (obstructive sleep apnea)    Seizure (HCC)    Seizure disorder (HCC)    Sturge syndrome (HCC)    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HCC)     Past Surgical History:  Procedure Laterality Date   AMPUTATION Left 07/24/2019   Procedure: AMPUTATION RAY, fifth left;  Surgeon: Nadara Mustard, MD;  Location: [facility] OR;  Service: Orthopedics;  Laterality: Left;   COLONOSCOPY     CRANIOTOMY  1999   melanoma removal  2011    Prior to Admission medications   Medication Sig Start Date End Date Taking? Authorizing Provider  aspirin 81 MG chewable tablet Chew 81 mg by mouth daily.   Yes [provider]  brimonidine (ALPHAGAN) 0.2 % ophthalmic solution Place 1 drop into both eyes 3 (three) times daily. 08/28/19  Yes Margit Hanks, MD  carboxymethylcellulose (REFRESH PLUS) 0.5 % SOLN Place 1 drop into both eyes every 6 (six) hours as needed (dry eyes).   Yes [provider]  cholecalciferol (VITAMIN D3) 25 MCG (1000 UT) tablet Take 1 tablet (1,000 Units total) by mouth daily. 08/28/19  Yes Margit Hanks, MD  dorzolamide-timolol (COSOPT) 22.3-6.8 MG/ML ophthalmic solution Place 1 drop into the right eye 2 (two) times daily. 08/28/19  Yes Margit Hanks, MD  gabapentin (NEURONTIN) 100 MG capsule Take 1 capsule (100 mg total) by mouth 2 (two) times daily. Patient taking differently: Take 100 mg by mouth 3 (three) times daily. 08/28/19  Yes Margit Hanks, MD  hydrochlorothiazide (HYDRODIURIL) 25 MG tablet Take 1 tablet (25 mg total)  by mouth daily. 08/28/19  Yes Margit Hanks, MD  JARDIANCE 10 MG TABS tablet Take 10 mg by mouth daily. 04/03/21  Yes [provider]  melatonin 3 MG TABS tablet Take 6 mg by mouth at bedtime.   Yes [provider]  meloxicam (MOBIC) 15 MG tablet Take 1 tablet (15 mg total) by mouth daily. 12/30/19  Yes Felecia Shelling, DPM  metFORMIN (GLUCOPHAGE) 1000 MG tablet Take 1 tablet (1,000 mg total) by mouth 2 (two) times daily with a meal. 08/28/19  Yes Margit Hanks, MD  Multiple Vitamin (MULTIVITAMIN) TABS Take 1 tablet by mouth daily with breakfast.   Yes [provider]  omeprazole (PRILOSEC) 20 MG capsule Take 20 mg by mouth daily. 03/14/23  Yes [provider]  potassium chloride SA (KLOR-CON M) 20 MEQ tablet Take 40 mEq by mouth daily.   Yes [provider]  psyllium (REGULOID) 0.52 g capsule Take 0.52 g by mouth daily.   Yes [provider]  ROCKLATAN 0.02-0.005 % SOLN Place 1 drop into both eyes at bedtime. 08/28/19  Yes Margit Hanks, MD  sitaGLIPtin (JANUVIA) 100 MG tablet Take 1 tablet (100 mg total) by mouth daily. 08/28/19  Yes Margit Hanks, MD  Emollient (EUCERIN) lotion Apply 1 Application topically as needed for dry skin.    [provider]  escitalopram (LEXAPRO) 10 MG tablet Take 10 mg by mouth daily. 12/20/21   [provider]  fenofibrate (TRICOR) 48 MG tablet Take 48 mg by mouth daily.    [provider]  furosemide (LASIX) 20 MG tablet Take 20 mg by mouth daily.    [provider]  KERENDIA 10 MG TABS Take 10 mg by mouth daily.    [provider]  Misc. Devices MISC Please supply patient with shower chair with U-shaped seat. 10/07/19   [provider]  olmesartan (BENICAR) 5 MG tablet Take 5 mg by mouth daily. 12/13/22 12/13/23  [provider]  rosuvastatin (CRESTOR) 20 MG tablet Take 1 tablet (20 mg total) by mouth daily. Patient not taking: Reported on  04/03/2023 08/28/19   Margit Hanks, MD  rosuvastatin (CRESTOR) 40 MG tablet Take 40 mg by mouth at bedtime. 03/14/23   [provider]  tamsulosin (FLOMAX) 0.4 MG CAPS capsule Take 1 capsule (0.4 mg total) by mouth daily. 08/28/19   Margit Hanks, MD    Allergies as of 03/07/2023 - Review Complete 03/06/2023  Allergen Reaction Noted   Penicillin g Anaphylaxis 03/06/2023    Family History  Problem Relation Age of Onset   Emphysema Father    Cancer Father    Asthma Mother     Social History   Socioeconomic History   Marital status: Single    Spouse name: Not on file   Number of children: Not on file   Years of education: Not on file   Highest education level: Not on file  Occupational History   Occupation: [employer]    Comment: resides in a group home([facility])  Tobacco Use   Smoking status: Never   Smokeless tobacco: Never  Vaping Use   Vaping status: Never Used  Substance and Sexual Activity   Alcohol use: Not Currently   Drug use: Not Currently   Sexual activity: Not Currently  Other Topics Concern   Not on file  Social History Narrative   Single.  Employed by [employer].  Never smoker. Has legal guardian.    Social Determinants of Health   Financial Resource Strain: Low Risk  (10/25/2021)   Received from [facility]   Overall Financial Resource Strain (CARDIA)    Difficulty of Paying Living Expenses: Not hard at all  Food Insecurity: No Food Insecurity (10/25/2021)   Received from [facility]   Hunger Vital Sign    Worried About Running Out of Food in the Last Year: Never true    Ran Out of Food in the Last Year: Never true  Transportation Needs: No Transportation Needs (10/25/2021)   Received [facility] - Transportation    Lack of Transportation (Medical): No    Lack of Transportation (Non-Medical): No  Physical Activity: Unknown (10/25/2021)   Received from [facility]   Exercise Vital Sign    Days of Exercise  per Week: 0 days    Minutes of Exercise per Session: Not on file  Recent Concern: Physical Activity - Inactive (10/25/2021)   Received from [facility]   Exercise Vital Sign    Days of Exercise per Week: 0 days    Minutes of Exercise per Session: 0 min  Stress: Stress Concern Present (10/25/2021)   Received from [facility] - Occupational Stress Questionnaire    Feeling of Stress : To some extent  Social Connections: Unknown (01/21/2022)  Received from [facility]   Social Network    Social Network: Not on file  Recent Concern: Social Connections - Socially Isolated (10/25/2021)   Received from [facility]   Social Connection and Isolation Panel [NHANES]    Frequency of Communication with Friends and Family: Never    Frequency of Social Gatherings with Friends and Family: Never    Attends Religious Services: Never    Database administrator or Organizations: Yes    Attends Banker Meetings: Never    Marital Status: Never married  Intimate Partner Violence: Unknown (12/27/2021)   Received from [facility]   HITS    Physically Hurt: Not on file    Insult or Talk Down To: Not on file    Threaten Physical Harm: Not on file    Scream or Curse: Not on file    Review of Systems: See HPI, otherwise negative ROS  Physical Exam: Vital signs in last 24 hours:     General:   Alert,  Well-developed, well-nourished, pleasant and cooperative in NAD Head:  Normocephalic and atraumatic. Eyes:  Sclera clear, no icterus.   Conjunctiva pink. Ears:  Normal auditory acuity. Nose:  No deformity, discharge,  or lesions. Msk:  Symmetrical without gross deformities. Normal posture. Extremities:  Without clubbing or edema. Neurologic:  Alert and  oriented x4;  grossly normal neurologically. Skin:  Intact without significant lesions or rashes. Psych:  Alert and cooperative. Normal mood and affect.  Impression/Plan: Xavier Turner is here  for a colonoscopy to be performed for colon cancer screening purposes.  The risks of the procedure including infection, bleed, or perforation as well as benefits, limitations, alternatives and imponderables have been reviewed with the patient. Questions have been answered. All parties agreeable.

## 2023-04-08 NOTE — Anesthesia Postprocedure Evaluation (Signed)
Anesthesia Post Note  Patient: Xavier Turner  Procedure(s) Performed: FLEXIBLE SIGMOIDOSCOPY  Patient location during evaluation: Phase II Anesthesia Type: General Level of consciousness: awake and alert and oriented Pain management: pain level controlled Vital Signs Assessment: post-procedure vital signs reviewed and stable Respiratory status: spontaneous breathing, nonlabored ventilation and respiratory function stable Cardiovascular status: blood pressure returned to baseline and stable Postop Assessment: no apparent nausea or vomiting Anesthetic complications: no  No notable events documented.   Last Vitals:  Vitals:   04/08/23 0905 04/08/23 0912  BP: (!) 82/46 (!) 95/57  Pulse: 68   Resp: 13   Temp: 36.6 C   SpO2: 95%     Last Pain:  Vitals:   04/08/23 0905  TempSrc: Oral  PainSc: 0-No pain                 Ricco Dershem C Rovena Hearld

## 2023-04-11 ENCOUNTER — Encounter (HOSPITAL_COMMUNITY): Payer: Self-pay | Admitting: Internal Medicine

## 2023-04-16 ENCOUNTER — Telehealth: Payer: Self-pay | Admitting: Internal Medicine

## 2023-04-16 NOTE — Telephone Encounter (Signed)
Belinda from [facility] called about incomplete prep and wanted to know if they would need to reschedule patient for colonoscopy.

## 2023-05-02 ENCOUNTER — Ambulatory Visit: Payer: Medicare Other | Admitting: Podiatry

## 2023-05-16 ENCOUNTER — Ambulatory Visit (INDEPENDENT_AMBULATORY_CARE_PROVIDER_SITE_OTHER): Payer: Medicare Other | Admitting: Podiatry

## 2023-05-16 ENCOUNTER — Encounter: Payer: Self-pay | Admitting: Podiatry

## 2023-05-16 DIAGNOSIS — E785 Hyperlipidemia, unspecified: Secondary | ICD-10-CM | POA: Diagnosis not present

## 2023-05-16 DIAGNOSIS — L84 Corns and callosities: Secondary | ICD-10-CM | POA: Diagnosis not present

## 2023-05-16 DIAGNOSIS — B351 Tinea unguium: Secondary | ICD-10-CM | POA: Diagnosis not present

## 2023-05-16 DIAGNOSIS — M79675 Pain in left toe(s): Secondary | ICD-10-CM | POA: Diagnosis not present

## 2023-05-16 DIAGNOSIS — M79674 Pain in right toe(s): Secondary | ICD-10-CM | POA: Diagnosis not present

## 2023-05-16 DIAGNOSIS — E1169 Type 2 diabetes mellitus with other specified complication: Secondary | ICD-10-CM

## 2023-05-16 NOTE — Progress Notes (Signed)
  Subjective:  Patient ID: Xavier Turner, male    DOB: Mar 28, 1956,  MRN: [id number]  Chief Complaint  Patient presents with   Diabetes    Rm3: Patient is here for a follow up for chronic conditions for [facility]     67 y.o. male returns with the above complaint. History confirmed with patient.  Here today for at risk diabetic foot care his nails are thickened elongated and causing pain again.  Callus has returned and is painful as well.  Reports no new issues.  Says his blood sugar control is good.  Objective:  Physical Exam: warm, good capillary refill, no trophic changes or ulcerative lesions, normal DP and PT pulses, and abnormal sensory exam.  He has severe pes cavus foot deformity.  Prominent area of tenderness over the fifth metatarsal base plantarly.  Hyperkeratotic painful callus here.  Thickened elongated nails with yellow discoloration and subungual debris of all remaining toenails  Summary:  RIGHT:  - No evidence of common femoral vein obstruction.     LEFT:  - There is no evidence of deep vein thrombosis in the lower extremity.  - There is no evidence of superficial venous thrombosis.     - No cystic structure found in the popliteal fossa.      *See table(s) above for measurements and observations.   Electronically signed by Gerarda Fraction on 10/06/2021 at 4:20:40 PM.   Lab work was unrevealing for gout or infectious process Assessment:   1. Pain due to onychomycosis of toenails of both feet   2. Callus of foot   3. Type 2 diabetes mellitus with hyperlipidemia (HCC)         Plan:  Patient was evaluated and treated and all questions answered.     Discussed the etiology and treatment options for the condition in detail with the patient. Educated patient on the topical and oral treatment options for mycotic nails. Recommended debridement of the nails today. Sharp and mechanical debridement performed of all painful and mycotic nails today. Nails debrided in length and  thickness using a nail nipper to level of comfort. Discussed treatment options including appropriate shoe gear. Follow up as needed for painful nails.  All symptomatic hyperkeratoses were safely debrided with a sterile #15 blade to patient's level of comfort without incident. We discussed preventative and palliative care of these lesions including supportive and accommodative shoegear, padding, prefabricated and custom molded accommodative orthoses, use of a pumice stone and lotions/creams daily.  Return in about 3 months (around 08/16/2023) for at risk diabetic foot care.

## 2023-06-06 ENCOUNTER — Encounter: Payer: Self-pay | Admitting: Gastroenterology

## 2023-06-06 ENCOUNTER — Ambulatory Visit (INDEPENDENT_AMBULATORY_CARE_PROVIDER_SITE_OTHER): Payer: Medicare Other | Admitting: Gastroenterology

## 2023-06-06 VITALS — BP 93/55 | HR 80 | Temp 98.6°F | Ht 65.0 in | Wt 199.0 lb

## 2023-06-06 DIAGNOSIS — N189 Chronic kidney disease, unspecified: Secondary | ICD-10-CM | POA: Diagnosis not present

## 2023-06-06 DIAGNOSIS — D631 Anemia in chronic kidney disease: Secondary | ICD-10-CM

## 2023-06-06 DIAGNOSIS — Z1211 Encounter for screening for malignant neoplasm of colon: Secondary | ICD-10-CM

## 2023-06-06 DIAGNOSIS — K625 Hemorrhage of anus and rectum: Secondary | ICD-10-CM

## 2023-06-06 DIAGNOSIS — K5900 Constipation, unspecified: Secondary | ICD-10-CM | POA: Diagnosis not present

## 2023-06-06 MED ORDER — POLYETHYLENE GLYCOL 3350 17 G PO PACK: 17.0000 g | PACK | Freq: Every day | ORAL | 0 refills | Status: AC

## 2023-06-06 MED ORDER — DOCUSATE SODIUM 100 MG PO CAPS: 100.0000 mg | ORAL_CAPSULE | Freq: Two times a day (BID) | ORAL | 3 refills | Status: AC

## 2023-06-06 NOTE — Progress Notes (Signed)
GI Office Note    Referring Provider: No ref. provider found Primary Care Physician:  System, Provider Not In Primary Gastroenterologist: Hennie Duos. Marletta Lor, DO  Date:  06/06/2023  ID:  Xavier Turner, DOB 1955/10/24, MRN [id number]   Chief Complaint   Chief Complaint  Patient presents with   Follow-up    Follow up to reschedule colonoscopy   History of Present Illness  Xavier Turner is a 67 y.o. male with a history of diabetes, HLD, PVD, anemia of chronic disease, CKD stage I, glaucoma, depression, epilepsy, OSA, GERD, Sturge-Weber syndrome, and BPH presenting today to discuss rescheduling colonoscopy due to rectal bleeding.  Per referral paperwork it appears that patient reported on 5/21 that he had some blood that presented after a bowel movement.  He was evaluated by the nurse practitioner who advised CBC, GI consult, and was ordered Carafate 1 g by mouth every 8 hours for 6 weeks.   Per review of chart it appears he had a colonoscopy in 2006 with Dr. Leone Payor which was normal.  About an hour and a half after procedure he developed some abdominal discomfort and had some air trapping for which scope was reinserted and colon Decompressed.  He was recommended to have a colonoscopy in 10 years given he has a second-degree relative with colon cancer.   Appears that he was seen with a provider in the [facility] system in 2013 and was advised to schedule a colonoscopy as he had an occult positive stool at that time.  It does not appear that he had a colonoscopy after this office visit unless it is not documented or available in Care Everywhere.   Echocardiogram in January 2024 with EF 60 to 65%.  Seen for initial consultation 03/06/2023.  Patient reported a couple episodes of BRBPR.  Had a normal-looking stool followed by a looser stool with blood present both times.  With some subsequent other bowel movements he has had a little bit of blood as well and some mild abdominal pain.  He reported a  good appetite along with some mild rectal discomfort.  He denies any chest pain, shortness of breath, fatigue, or dizziness.  He had reported some history of rectal bleeding in his family as well.  Review of his chart indicated that he was recommended to have a colonoscopy in 2013 but does not appear that this was performed.  Rectal exam in the office did not reveal any mass.  FOBT in the office was positive.  Patient was advised to schedule colonoscopy and use Preparation H twice daily for 1 week.  Colonoscopy 04/08/2023: -Patient had an adequate prep as there was stool in the rectum and sigmoid colon  Today: Reports he had some rectal bleeding this morning with BM. He reports he was unable to tolerate the taste of his colonoscopy prep. He states he almost puked and said he couldn't do anymore. He drank very little of the prep.   He does have some rectal discomfort but denies abdominal pain. He states it feel like a fingernail has scraped his rectum. Has pain with BMs. He does report a hard time with BM this morning and has been having this issue for a little while. States he has a daily BM.   Denies chest pain or shortness of breath. Has a good appetite.   Current Outpatient Medications  Medication Sig Dispense Refill   aspirin 81 MG chewable tablet Chew 81 mg by mouth daily.     brimonidine (ALPHAGAN)  0.2 % ophthalmic solution Place 1 drop into both eyes 3 (three) times daily. 5 mL 0   carboxymethylcellulose (REFRESH PLUS) 0.5 % SOLN Place 1 drop into both eyes every 6 (six) hours as needed (dry eyes).     cholecalciferol (VITAMIN D3) 25 MCG (1000 UT) tablet Take 1 tablet (1,000 Units total) by mouth daily. 30 tablet 0   dorzolamide-timolol (COSOPT) 22.3-6.8 MG/ML ophthalmic solution Place 1 drop into the right eye 2 (two) times daily. 10 mL 0   Emollient (EUCERIN) lotion Apply 1 Application topically as needed for dry skin.     escitalopram (LEXAPRO) 10 MG tablet Take 10 mg by mouth daily.      fenofibrate (TRICOR) 48 MG tablet Take 48 mg by mouth daily.     furosemide (LASIX) 20 MG tablet Take 20 mg by mouth daily.     gabapentin (NEURONTIN) 100 MG capsule Take 1 capsule (100 mg total) by mouth 2 (two) times daily. (Patient taking differently: Take 100 mg by mouth 3 (three) times daily.) 60 capsule 0   hydrochlorothiazide (HYDRODIURIL) 25 MG tablet Take 1 tablet (25 mg total) by mouth daily. 30 tablet 0   JARDIANCE 10 MG TABS tablet Take 10 mg by mouth daily.     KERENDIA 10 MG TABS Take 10 mg by mouth daily.     melatonin 3 MG TABS tablet Take 6 mg by mouth at bedtime.     meloxicam (MOBIC) 15 MG tablet Take 1 tablet (15 mg total) by mouth daily. 30 tablet 1   metFORMIN (GLUCOPHAGE) 1000 MG tablet Take 1 tablet (1,000 mg total) by mouth 2 (two) times daily with a meal. 60 tablet 0   Misc. Devices MISC Please supply patient with shower chair with U-shaped seat.     Multiple Vitamin (MULTIVITAMIN) TABS Take 1 tablet by mouth daily with breakfast.     olmesartan (BENICAR) 5 MG tablet Take 5 mg by mouth daily.     omeprazole (PRILOSEC) 20 MG capsule Take 20 mg by mouth daily.     potassium chloride SA (KLOR-CON M) 20 MEQ tablet Take 40 mEq by mouth daily.     psyllium (REGULOID) 0.52 g capsule Take 0.52 g by mouth daily.     ROCKLATAN 0.02-0.005 % SOLN Place 1 drop into both eyes at bedtime. 2.5 mL 0   rosuvastatin (CRESTOR) 20 MG tablet Take 1 tablet (20 mg total) by mouth daily. 30 tablet 0   rosuvastatin (CRESTOR) 40 MG tablet Take 40 mg by mouth at bedtime.     sitaGLIPtin (JANUVIA) 100 MG tablet Take 1 tablet (100 mg total) by mouth daily. 30 tablet 0   tamsulosin (FLOMAX) 0.4 MG CAPS capsule Take 1 capsule (0.4 mg total) by mouth daily. 30 capsule 0   No current facility-administered medications for this visit.    Past Medical History:  Diagnosis Date   Anemia    Asthma    Chronic kidney disease    Diabetes (HCC)    Glaucoma    Hemiparesis (HCC)     Hypercholesterolemia    Hyperlipemia    Melanoma (HCC)    Melanoma (HCC)    Obesity    OSA (obstructive sleep apnea)    Seizure (HCC)    Seizure disorder (HCC)    Sturge syndrome (HCC)    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HCC)     Past Surgical History:  Procedure Laterality Date   AMPUTATION Left 07/24/2019   Procedure: AMPUTATION RAY, fifth left;  Surgeon: Aldean Baker  V, MD;  Location: [facility] OR;  Service: Orthopedics;  Laterality: Left;   COLONOSCOPY     CRANIOTOMY  1999   FLEXIBLE SIGMOIDOSCOPY  04/08/2023   Procedure: FLEXIBLE SIGMOIDOSCOPY;  Surgeon: Lanelle Bal, DO;  Location: [facility];  Service: Endoscopy;;   melanoma removal  2011    Family History  Problem Relation Age of Onset   Emphysema Father    Cancer Father    Asthma Mother     Allergies as of 06/06/2023 - Review Complete 06/06/2023  Allergen Reaction Noted   Penicillin g Anaphylaxis 03/06/2023    Social History   Socioeconomic History   Marital status: Single    Spouse name: Not on file   Number of children: Not on file   Years of education: Not on file   Highest education level: Not on file  Occupational History   Occupation: [employer]    Comment: resides in a group home([facility])  Tobacco Use   Smoking status: Never   Smokeless tobacco: Never  Vaping Use   Vaping status: Never Used  Substance and Sexual Activity   Alcohol use: Not Currently   Drug use: Not Currently   Sexual activity: Not Currently  Other Topics Concern   Not on file  Social History Narrative   Single.  Employed by [employer].  Never smoker. Has legal guardian.    Social Determinants of Health   Financial Resource Strain: Low Risk  (10/25/2021)   [facility]   Overall Financial Resource Strain (CARDIA)    Difficulty of Paying Living Expenses: Not hard at all  Food Insecurity: No Food Insecurity (10/25/2021)   Received from [facility], [facility]   Hunger Vital Sign     Worried About Running Out of Food in the Last Year: Never true    Ran Out of Food in the Last Year: Never true  Transportation Needs: No Transportation Needs (10/25/2021)   Received from [facility], [facility]   PRAPARE - Transportation    Lack of Transportation (Medical): No    Lack of Transportation (Non-Medical): No  Physical Activity: Unknown (10/25/2021)   Received from [facility], [facility]   Exercise Vital Sign    Days of Exercise per Week: 0 days    Minutes of Exercise per Session: Not on file  Recent Concern: Physical Activity - Inactive (10/25/2021)   Received from [facility]   Exercise Vital Sign    Days of Exercise per Week: 0 days    Minutes of Exercise per Session: 0 min  Stress: Stress Concern Present (10/25/2021)   Received from [facility], [facility] - Occupational Stress Questionnaire    Feeling of Stress : To some extent  Social Connections: Unknown (01/21/2022)   Received from [facility], [facility]   Social Network    Social Network: Not on file  Recent Concern: Social Connections - Socially Isolated (10/25/2021)   Received from [facility], [facility]   Social Connection and Isolation Panel [NHANES]    Frequency of Communication with Friends and Family: Never    Frequency of Social Gatherings with Friends and Family: Never    Attends Religious Services: Never    Database administrator or Organizations: Yes    Attends Banker Meetings: Never    Marital Status: Never married     Review of Systems   Gen: Denies fever, chills, anorexia. Denies fatigue, weakness, weight  loss.  CV: Denies chest pain, palpitations, syncope, peripheral edema, and claudication. Resp: Denies dyspnea at rest, cough, wheezing, coughing up blood, and pleurisy. GI: See HPI Derm: Denies rash, itching, dry skin Psych: Denies depression, anxiety, memory loss, confusion. No homicidal or suicidal ideation.   Heme: Denies bruising, bleeding, and enlarged lymph nodes.   Physical Exam   BP (!) 93/55   Pulse 80   Temp 98.6 F (37 C)   Ht 5\' 5"  (1.651 m)   Wt 199 lb (90.3 kg)   BMI 33.12 kg/m   General:   Alert and oriented. No distress noted. Pleasant and cooperative. 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 Mouth:  Oral mucosa pink and moist. Good dentition. No lesions. Lungs:  Clear to auscultation bilaterally. No wheezes, rales, or rhonchi. No distress.  Heart:  S1, S2 present without murmurs appreciated.  Abdomen:  +BS, soft, non-tender and non-distended. No rebound or guarding. No HSM or masses noted. Rectal: deferred Msk:  Symmetrical without gross deformities. Normal posture. Extremities:  Without edema. Neurologic:  Alert and  oriented x4 Psych:  Alert and cooperative. Normal mood and affect.   Assessment  KRUSE SHOUPE is a 67 y.o. male with a history of diabetes, HLD, PVD, anemia of chronic disease, CKD stage I, glaucoma, depression, epilepsy, OSA, GERD, Sturge-Weber syndrome, and BPH presenting today to discuss rescheduling colonoscopy due to rectal bleeding.  Normocytic anemia, rectal bleeding: Labs in April and May 2024 with normocytic anemia with hemoglobin 12.3.  This could be multifactorial in the setting of his mild rectal bleeding as well as CKD.  Patient reports episode of rectal bleeding this morning and has had a few other episodes since his last visit in June.  Colonoscopy attempted in the interim (July) however patient did not have an adequate prep.  He states he only drink a little bit of the prep and was unable to tolerate it due to taste as he started feeling nauseous and like he wanted to vomit.  In regards to his rectal bleeding this has happened a few times and he also reports some rectal discomfort with bowel movements.  No fissure noted on prior rectal exam however given his constipation this is possible.  Will  treat with compounded hemorrhoid cream with lidocaine and nifedipine.  Spoke to patient regarding potential side effects.  Constipation: Patient reports a hard time with bowel movements.  Has been having to strain intermittently and reports some hard stools at times as well.  He does report a daily bowel movement but sometimes is difficult for him.  Will advised to start MiraLAX as well as Colace once daily to help with better bowel regularity.  Denies any lower abdominal pain.  Screening for colon cancer: Normal colonoscopy in 2006.  Well overdue for colon cancer screening.  As stated above, recent colonoscopy attempt unsuccessful.  Will try again with different prep.  Continues to have some intermittent rectal bleeding with bowel movements.  Spoke to patient's guardian Morene Antu (sister).  I updated her on the plan and she has agreed for [place] to again try to attempt a repeat colonoscopy.  All questions answered.  PLAN   Proceed with colonoscopy with propofol by Dr. Marletta Lor  in near future: the risks, benefits, and alternatives have been discussed with the patient in detail. The patient states understanding and desires to proceed. ASA 3 Miralax prep 1.5 days of clear liquids Hold metformin night prior to morning of  procedure Hold Jardiance for 3 days Hold Januvia morning of procedure Linzess 290 daily for 4 days prior Treat like fissure. Will order compounded hemorrhoid cream with lidocaine and nifedipine.  Start miralax 17g once daily.  Start colace once daily.  Follow up 3 months   Brooke Bonito, MSN, FNP-BC, AGACNP-BC [facility]

## 2023-06-06 NOTE — Patient Instructions (Signed)
We will get him scheduled for another attempt at colonoscopy.  Prep will be augmented.  Please ensure patient follows instructions completely.  He is to have no solid food for a day and a half prior to procedures.  I will call in a compounded hemorrhoid cream with lidocaine and nifedipine to apply 3 times a day for 2 weeks.  I will see if [facility] can deliver this medication to facility.  Please call back if there is an issue with this medication.  Please start MiraLAX 17 g daily in 8 ounces of water.  Please also begin Colace 100 mg daily.  Will see him in the clinic for follow-up in 3 months.  It was a pleasure to see you today. I want to create trusting relationships with patients. If you receive a survey regarding your visit,  I greatly appreciate you taking time to fill this out on paper or through your MyChart. I value your feedback.  Brooke Bonito, MSN, FNP-BC, AGACNP-BC [facility]

## 2023-06-13 ENCOUNTER — Telehealth: Payer: Self-pay | Admitting: *Deleted

## 2023-06-13 ENCOUNTER — Encounter: Payer: Self-pay | Admitting: *Deleted

## 2023-06-13 NOTE — Telephone Encounter (Signed)
Pt has been scheduled for 07/19/23. Instructions faxed to [place] at [facility].

## 2023-06-13 NOTE — Telephone Encounter (Signed)
Spoke with nurse at [facility] and she states that the lady that handles the scheduling isn't answering her phone. She will have her return our call.

## 2023-07-08 ENCOUNTER — Emergency Department (HOSPITAL_COMMUNITY): Payer: Medicare Other

## 2023-07-08 ENCOUNTER — Encounter (HOSPITAL_COMMUNITY): Payer: Self-pay | Admitting: Emergency Medicine

## 2023-07-08 ENCOUNTER — Other Ambulatory Visit: Payer: Self-pay

## 2023-07-08 ENCOUNTER — Inpatient Hospital Stay (HOSPITAL_COMMUNITY)
Admission: EM | Admit: 2023-07-08 | Discharge: 2023-07-16 | DRG: 312 | Disposition: A | Discharge status: Skilled Nursing Facility | Payer: Medicare Other | Attending: Internal Medicine | Admitting: Internal Medicine

## 2023-07-08 DIAGNOSIS — E119 Type 2 diabetes mellitus without complications: Secondary | ICD-10-CM

## 2023-07-08 DIAGNOSIS — G4733 Obstructive sleep apnea (adult) (pediatric): Secondary | ICD-10-CM | POA: Diagnosis present

## 2023-07-08 DIAGNOSIS — W19XXXA Unspecified fall, initial encounter: Principal | ICD-10-CM | POA: Diagnosis present

## 2023-07-08 DIAGNOSIS — Z85820 Personal history of malignant melanoma of skin: Secondary | ICD-10-CM

## 2023-07-08 DIAGNOSIS — Z6834 Body mass index (BMI) 34.0-34.9, adult: Secondary | ICD-10-CM

## 2023-07-08 DIAGNOSIS — E1169 Type 2 diabetes mellitus with other specified complication: Secondary | ICD-10-CM | POA: Diagnosis present

## 2023-07-08 DIAGNOSIS — G40909 Epilepsy, unspecified, not intractable, without status epilepticus: Secondary | ICD-10-CM | POA: Diagnosis present

## 2023-07-08 DIAGNOSIS — Z7982 Long term (current) use of aspirin: Secondary | ICD-10-CM

## 2023-07-08 DIAGNOSIS — N390 Urinary tract infection, site not specified: Secondary | ICD-10-CM | POA: Diagnosis present

## 2023-07-08 DIAGNOSIS — M21372 Foot drop, left foot: Secondary | ICD-10-CM | POA: Diagnosis present

## 2023-07-08 DIAGNOSIS — Q8589 Other phakomatoses, not elsewhere classified: Secondary | ICD-10-CM

## 2023-07-08 DIAGNOSIS — D509 Iron deficiency anemia, unspecified: Secondary | ICD-10-CM | POA: Diagnosis present

## 2023-07-08 DIAGNOSIS — I69354 Hemiplegia and hemiparesis following cerebral infarction affecting left non-dominant side: Secondary | ICD-10-CM

## 2023-07-08 DIAGNOSIS — I129 Hypertensive chronic kidney disease with stage 1 through stage 4 chronic kidney disease, or unspecified chronic kidney disease: Secondary | ICD-10-CM | POA: Diagnosis present

## 2023-07-08 DIAGNOSIS — Z7984 Long term (current) use of oral hypoglycemic drugs: Secondary | ICD-10-CM

## 2023-07-08 DIAGNOSIS — Z825 Family history of asthma and other chronic lower respiratory diseases: Secondary | ICD-10-CM

## 2023-07-08 DIAGNOSIS — T501X5A Adverse effect of loop [high-ceiling] diuretics, initial encounter: Secondary | ICD-10-CM | POA: Diagnosis present

## 2023-07-08 DIAGNOSIS — J45909 Unspecified asthma, uncomplicated: Secondary | ICD-10-CM | POA: Diagnosis present

## 2023-07-08 DIAGNOSIS — R42 Dizziness and giddiness: Secondary | ICD-10-CM

## 2023-07-08 DIAGNOSIS — Z791 Long term (current) use of non-steroidal anti-inflammatories (NSAID): Secondary | ICD-10-CM

## 2023-07-08 DIAGNOSIS — E7800 Pure hypercholesterolemia, unspecified: Secondary | ICD-10-CM | POA: Diagnosis present

## 2023-07-08 DIAGNOSIS — I951 Orthostatic hypotension: Principal | ICD-10-CM | POA: Diagnosis present

## 2023-07-08 DIAGNOSIS — R55 Syncope and collapse: Secondary | ICD-10-CM

## 2023-07-08 DIAGNOSIS — L03116 Cellulitis of left lower limb: Secondary | ICD-10-CM | POA: Diagnosis present

## 2023-07-08 DIAGNOSIS — M869 Osteomyelitis, unspecified: Secondary | ICD-10-CM | POA: Diagnosis present

## 2023-07-08 DIAGNOSIS — E669 Obesity, unspecified: Secondary | ICD-10-CM | POA: Diagnosis present

## 2023-07-08 DIAGNOSIS — D649 Anemia, unspecified: Secondary | ICD-10-CM

## 2023-07-08 DIAGNOSIS — N400 Enlarged prostate without lower urinary tract symptoms: Secondary | ICD-10-CM | POA: Diagnosis present

## 2023-07-08 DIAGNOSIS — Z66 Do not resuscitate: Secondary | ICD-10-CM | POA: Diagnosis present

## 2023-07-08 DIAGNOSIS — E1122 Type 2 diabetes mellitus with diabetic chronic kidney disease: Secondary | ICD-10-CM | POA: Diagnosis present

## 2023-07-08 DIAGNOSIS — Z79899 Other long term (current) drug therapy: Secondary | ICD-10-CM

## 2023-07-08 DIAGNOSIS — N3090 Cystitis, unspecified without hematuria: Secondary | ICD-10-CM

## 2023-07-08 DIAGNOSIS — H409 Unspecified glaucoma: Secondary | ICD-10-CM | POA: Diagnosis present

## 2023-07-08 DIAGNOSIS — F32A Depression, unspecified: Secondary | ICD-10-CM | POA: Diagnosis present

## 2023-07-08 DIAGNOSIS — F419 Anxiety disorder, unspecified: Secondary | ICD-10-CM | POA: Diagnosis present

## 2023-07-08 DIAGNOSIS — S0990XA Unspecified injury of head, initial encounter: Secondary | ICD-10-CM | POA: Diagnosis present

## 2023-07-08 DIAGNOSIS — Z880 Allergy status to penicillin: Secondary | ICD-10-CM

## 2023-07-08 LAB — CBC
HCT: 32.4 % — ABNORMAL LOW (ref 39.0–52.0)
Hemoglobin: 10.2 g/dL — ABNORMAL LOW (ref 13.0–17.0)
MCH: 28.4 pg (ref 26.0–34.0)
MCHC: 31.5 g/dL (ref 30.0–36.0)
MCV: 90.3 fL (ref 80.0–100.0)
Platelets: 188 10*3/uL (ref 150–400)
RBC: 3.59 MIL/uL — ABNORMAL LOW (ref 4.22–5.81)
RDW: 15.6 % — ABNORMAL HIGH (ref 11.5–15.5)
WBC: 8.4 10*3/uL (ref 4.0–10.5)
nRBC: 0 % (ref 0.0–0.2)

## 2023-07-08 LAB — PROTIME-INR
INR: 1.1 (ref 0.8–1.2)
Prothrombin Time: 14.4 s (ref 11.4–15.2)

## 2023-07-08 LAB — COMPREHENSIVE METABOLIC PANEL
ALT: 29 U/L (ref 0–44)
AST: 30 U/L (ref 15–41)
Albumin: 3 g/dL — ABNORMAL LOW (ref 3.5–5.0)
Alkaline Phosphatase: 73 U/L (ref 38–126)
Anion gap: 10 (ref 5–15)
BUN: 20 mg/dL (ref 8–23)
CO2: 26 mmol/L (ref 22–32)
Calcium: 9.2 mg/dL (ref 8.9–10.3)
Chloride: 97 mmol/L — ABNORMAL LOW (ref 98–111)
Creatinine, Ser: 0.83 mg/dL (ref 0.61–1.24)
GFR, Estimated: >60 mL/min (ref >60)
Glucose, Bld: 93 mg/dL (ref 70–99)
Potassium: 4.6 mmol/L (ref 3.5–5.1)
Sodium: 133 mmol/L — ABNORMAL LOW (ref 135–145)
Total Bilirubin: 0.8 mg/dL (ref 0.3–1.2)
Total Protein: 6.4 g/dL — ABNORMAL LOW (ref 6.5–8.1)

## 2023-07-08 NOTE — ED Provider Notes (Signed)
[place] EMERGENCY DEPARTMENT AT [facility] Provider Note   CSN: [id number] Arrival date & time: 07/08/23  2028     History  Chief Complaint  Patient presents with   Marletta Lor    Xavier Turner is a 67 y.o. male.  HPI Patient presents from a nursing facility following a fall.  Patient was standing up felt dizzy, fell over and hit his head. Takes no blood thinners.  He arrives in c-collar, complaining of right facial pain, neck pain, back pain. Per EMS staff recently adjusted the patient's blood pressure medications. Patient denies any neck pain, currently, states that his face hurts around the right temporal region.  He has a known right congenital mark, but is noted to have additional swelling beyond his normal.     Home Medications Prior to Admission medications   Medication Sig Start Date End Date Taking? Authorizing Provider  aspirin 81 MG chewable tablet Chew 81 mg by mouth daily.    [provider]  brimonidine (ALPHAGAN) 0.2 % ophthalmic solution Place 1 drop into both eyes 3 (three) times daily. 08/28/19   Margit Hanks, MD  carboxymethylcellulose (REFRESH PLUS) 0.5 % SOLN Place 1 drop into both eyes every 6 (six) hours as needed (dry eyes).    [provider]  cholecalciferol (VITAMIN D3) 25 MCG (1000 UT) tablet Take 1 tablet (1,000 Units total) by mouth daily. 08/28/19   Margit Hanks, MD  dorzolamide-timolol (COSOPT) 22.3-6.8 MG/ML ophthalmic solution Place 1 drop into the right eye 2 (two) times daily. 08/28/19   Margit Hanks, MD  Emollient (EUCERIN) lotion Apply 1 Application topically as needed for dry skin.    [provider]  escitalopram (LEXAPRO) 10 MG tablet Take 10 mg by mouth daily. 12/20/21   [provider]  fenofibrate (TRICOR) 48 MG tablet Take 48 mg by mouth daily.    [provider]  furosemide (LASIX) 20 MG tablet Take 20 mg by mouth daily.    [provider]  gabapentin (NEURONTIN)  100 MG capsule Take 1 capsule (100 mg total) by mouth 2 (two) times daily. Patient taking differently: Take 100 mg by mouth 3 (three) times daily. 08/28/19   Margit Hanks, MD  hydrochlorothiazide (HYDRODIURIL) 25 MG tablet Take 1 tablet (25 mg total) by mouth daily. 08/28/19   Margit Hanks, MD  JARDIANCE 10 MG TABS tablet Take 10 mg by mouth daily. 04/03/21   [provider]  KERENDIA 10 MG TABS Take 10 mg by mouth daily.    [provider]  melatonin 3 MG TABS tablet Take 6 mg by mouth at bedtime.    [provider]  meloxicam (MOBIC) 15 MG tablet Take 1 tablet (15 mg total) by mouth daily. Patient not taking: Reported on 06/06/2023 12/30/19   Felecia Shelling, DPM  metFORMIN (GLUCOPHAGE) 1000 MG tablet Take 1 tablet (1,000 mg total) by mouth 2 (two) times daily with a meal. 08/28/19   Margit Hanks, MD  Misc. Devices MISC Please supply patient with shower chair with U-shaped seat. 10/07/19   [provider]  Multiple Vitamin (MULTIVITAMIN) TABS Take 1 tablet by mouth daily with breakfast.    [provider]  olmesartan (BENICAR) 5 MG tablet Take 5 mg by mouth daily. Patient not taking: Reported on 06/06/2023 12/13/22 12/13/23  [provider]  omeprazole (PRILOSEC) 20 MG capsule Take 20 mg by mouth daily. 03/14/23   [provider]  polyethylene glycol (MIRALAX /  GLYCOLAX) 17 g packet Take 17 g by mouth daily. Mix in 8 ounces of water. 06/06/23   Aida Raider, NP  potassium chloride SA (KLOR-CON M) 20 MEQ tablet Take 40 mEq by mouth daily.    [provider]  psyllium (REGULOID) 0.52 g capsule Take 0.52 g by mouth daily.    [provider]  ROCKLATAN 0.02-0.005 % SOLN Place 1 drop into both eyes at bedtime. 08/28/19   Margit Hanks, MD  rosuvastatin (CRESTOR) 20 MG tablet Take 1 tablet (20 mg total) by mouth daily. 08/28/19   Margit Hanks, MD  rosuvastatin (CRESTOR) 40 MG tablet Take 40 mg by mouth at  bedtime. 03/14/23   [provider]  sitaGLIPtin (JANUVIA) 100 MG tablet Take 1 tablet (100 mg total) by mouth daily. 08/28/19   Margit Hanks, MD  tamsulosin (FLOMAX) 0.4 MG CAPS capsule Take 1 capsule (0.4 mg total) by mouth daily. 08/28/19   Margit Hanks, MD      Allergies    Penicillin g    Review of Systems   Review of Systems  Physical Exam Updated Vital Signs BP (!) 97/46   Pulse 71   Temp 97.6 F (36.4 C) (Oral)   Resp 12   SpO2 100%  Physical Exam Vitals and nursing note reviewed.  Constitutional:      Appearance: He is well-developed. He is obese. He is not ill-appearing, toxic-appearing or diaphoretic.  HENT:     Head: Normocephalic.   Eyes:     Conjunctiva/sclera: Conjunctivae normal.   Neck:   Cardiovascular:     Rate and Rhythm: Normal rate and regular rhythm.  Pulmonary:     Effort: Pulmonary effort is normal. No respiratory distress.     Breath sounds: No stridor.  Abdominal:     General: There is no distension.  Skin:    General: Skin is warm and dry.  Neurological:     Mental Status: He is alert and oriented to person, place, and time.     Motor: No weakness, tremor, atrophy or abnormal muscle tone.     ED Results / Procedures / Treatments   Labs (all labs ordered are listed, but only abnormal results are displayed) Labs Reviewed  COMPREHENSIVE METABOLIC PANEL - Abnormal; Notable for the following components:      Result Value   Sodium 133 (*)    Chloride 97 (*)    Total Protein 6.4 (*)    Albumin 3.0 (*)    All other components within normal limits  CBC - Abnormal; Notable for the following components:   RBC 3.59 (*)    Hemoglobin 10.2 (*)    HCT 32.4 (*)    RDW 15.6 (*)    All other components within normal limits  PROTIME-INR    EKG EKG Interpretation Date/Time:  Monday July 08 2023 20:38:55 EDT Ventricular Rate:  69 PR Interval:  160 QRS Duration:  105 QT Interval:  395 QTC Calculation: 424 R  Axis:   29  Text Interpretation: Sinus rhythm Low voltage, precordial leads Abnormal R-wave progression, early transition Artifact Abnormal ECG Confirmed by Gerhard Munch [phone]) on 07/08/2023 10:03:46 PM  Radiology DG Chest Port 1 View  Result Date: 07/08/2023 CLINICAL DATA:  Patient felt dizzy, fell, and hit his head. EXAM: PORTABLE CHEST 1 VIEW COMPARISON:  12/21/2022 FINDINGS: Shallow inspiration with probable atelectasis in the lung bases. Heart size and pulmonary vascularity are normal for technique. No focal consolidation or airspace disease.  No blunting of costophrenic angles. No pneumothorax. Mediastinal contours appear intact. IMPRESSION: No active disease. Electronically Signed   By: Burman Nieves M.D.   On: 07/08/2023 23:25   DG Pelvis Portable  Result Date: 07/08/2023 CLINICAL DATA:  Trauma.  Patient fell dizzy, fell, and hit his head. EXAM: PORTABLE PELVIS 1-2 VIEWS COMPARISON:  None Available. FINDINGS: There is no evidence of pelvic fracture or diastasis. No pelvic bone lesions are seen. Prostate calcifications are present. IMPRESSION: Negative. Electronically Signed   By: Burman Nieves M.D.   On: 07/08/2023 23:24    Procedures Procedures    Medications Ordered in ED Medications - No data to display  ED Course/ Medical Decision Making/ A&P                                 Medical Decision Making Adult male with multiple medical issues presents after an episode of fall with head trauma.  Patient is awake and alert, hemodynamically is notable for mild hypotension, otherwise unremarkable.  Patient had CT, monitoring. Cardiac 70 sinus normal Pulse ox 99% room air normal   Amount and/or Complexity of Data Reviewed Independent Historian: EMS External Data Reviewed: notes. Labs: ordered. Decision-making details documented in ED Course. Radiology: ordered and independent interpretation performed. Decision-making details documented in ED Course. ECG/medicine tests:  ordered and independent interpretation performed. Decision-making details documented in ED Course.  Risk Prescription drug management. Decision regarding hospitalization. Diagnosis or treatment significantly limited by social determinants of health.   11:42 PM Patient sleeping, studies pending on signout.        Final Clinical Impression(s) / ED Diagnoses Final diagnoses:  Fall, initial encounter     Gerhard Munch, MD 07/09/23 0002

## 2023-07-08 NOTE — ED Triage Notes (Signed)
Pt arrived via EMS from [facility]. Pt states he was standing up and felt dizzy causing him to fall and hit his head. Denies LOC, denies blood thinner. Pt arrived in c-collar. Pt c/o R sided facial pain, neck pain and mid back pain. Staff reports recent adjustments in his blood pressure medication due to hypotension.

## 2023-07-08 NOTE — ED Notes (Signed)
C-collar removed by MD Jeraldine Loots and towel rolls placed on patient for neck support.

## 2023-07-09 DIAGNOSIS — N390 Urinary tract infection, site not specified: Secondary | ICD-10-CM | POA: Diagnosis present

## 2023-07-09 DIAGNOSIS — D649 Anemia, unspecified: Secondary | ICD-10-CM

## 2023-07-09 DIAGNOSIS — Z66 Do not resuscitate: Secondary | ICD-10-CM | POA: Diagnosis present

## 2023-07-09 DIAGNOSIS — G4733 Obstructive sleep apnea (adult) (pediatric): Secondary | ICD-10-CM | POA: Diagnosis present

## 2023-07-09 DIAGNOSIS — I951 Orthostatic hypotension: Secondary | ICD-10-CM | POA: Diagnosis present

## 2023-07-09 DIAGNOSIS — R42 Dizziness and giddiness: Secondary | ICD-10-CM | POA: Diagnosis not present

## 2023-07-09 DIAGNOSIS — Q8589 Other phakomatoses, not elsewhere classified: Secondary | ICD-10-CM | POA: Diagnosis not present

## 2023-07-09 DIAGNOSIS — S0990XA Unspecified injury of head, initial encounter: Secondary | ICD-10-CM | POA: Diagnosis present

## 2023-07-09 DIAGNOSIS — N400 Enlarged prostate without lower urinary tract symptoms: Secondary | ICD-10-CM | POA: Diagnosis present

## 2023-07-09 DIAGNOSIS — W19XXXA Unspecified fall, initial encounter: Secondary | ICD-10-CM | POA: Diagnosis present

## 2023-07-09 DIAGNOSIS — J45909 Unspecified asthma, uncomplicated: Secondary | ICD-10-CM | POA: Diagnosis present

## 2023-07-09 DIAGNOSIS — M21372 Foot drop, left foot: Secondary | ICD-10-CM | POA: Diagnosis present

## 2023-07-09 DIAGNOSIS — E7800 Pure hypercholesterolemia, unspecified: Secondary | ICD-10-CM | POA: Diagnosis present

## 2023-07-09 DIAGNOSIS — M869 Osteomyelitis, unspecified: Secondary | ICD-10-CM | POA: Diagnosis present

## 2023-07-09 DIAGNOSIS — D509 Iron deficiency anemia, unspecified: Secondary | ICD-10-CM | POA: Diagnosis present

## 2023-07-09 DIAGNOSIS — G40909 Epilepsy, unspecified, not intractable, without status epilepticus: Secondary | ICD-10-CM | POA: Diagnosis present

## 2023-07-09 DIAGNOSIS — I129 Hypertensive chronic kidney disease with stage 1 through stage 4 chronic kidney disease, or unspecified chronic kidney disease: Secondary | ICD-10-CM | POA: Diagnosis present

## 2023-07-09 DIAGNOSIS — I69354 Hemiplegia and hemiparesis following cerebral infarction affecting left non-dominant side: Secondary | ICD-10-CM | POA: Diagnosis not present

## 2023-07-09 DIAGNOSIS — H409 Unspecified glaucoma: Secondary | ICD-10-CM | POA: Diagnosis present

## 2023-07-09 DIAGNOSIS — E1169 Type 2 diabetes mellitus with other specified complication: Secondary | ICD-10-CM | POA: Diagnosis present

## 2023-07-09 DIAGNOSIS — Z85820 Personal history of malignant melanoma of skin: Secondary | ICD-10-CM | POA: Diagnosis not present

## 2023-07-09 DIAGNOSIS — R55 Syncope and collapse: Secondary | ICD-10-CM | POA: Diagnosis not present

## 2023-07-09 DIAGNOSIS — E1122 Type 2 diabetes mellitus with diabetic chronic kidney disease: Secondary | ICD-10-CM | POA: Diagnosis present

## 2023-07-09 DIAGNOSIS — F32A Depression, unspecified: Secondary | ICD-10-CM | POA: Diagnosis present

## 2023-07-09 DIAGNOSIS — F419 Anxiety disorder, unspecified: Secondary | ICD-10-CM | POA: Diagnosis present

## 2023-07-09 DIAGNOSIS — T501X5A Adverse effect of loop [high-ceiling] diuretics, initial encounter: Secondary | ICD-10-CM | POA: Diagnosis present

## 2023-07-09 DIAGNOSIS — L03116 Cellulitis of left lower limb: Secondary | ICD-10-CM | POA: Diagnosis present

## 2023-07-09 DIAGNOSIS — E669 Obesity, unspecified: Secondary | ICD-10-CM | POA: Diagnosis present

## 2023-07-09 LAB — URINALYSIS, W/ REFLEX TO CULTURE (INFECTION SUSPECTED)
Bilirubin Urine: NEGATIVE
Glucose, UA: >=500 mg/dL — AB
Ketones, ur: NEGATIVE mg/dL
Nitrite: NEGATIVE
Protein, ur: NEGATIVE mg/dL
Specific Gravity, Urine: 1.005 (ref 1.005–1.030)
WBC, UA: >50 WBC/hpf (ref 0–5)
pH: 8 (ref 5.0–8.0)

## 2023-07-09 LAB — GLUCOSE, CAPILLARY
Glucose-Capillary: 92 mg/dL (ref 70–99)
Glucose-Capillary: 163 mg/dL — ABNORMAL HIGH (ref 70–99)

## 2023-07-09 LAB — URINE CULTURE

## 2023-07-09 LAB — CBC
HCT: 30.5 % — ABNORMAL LOW (ref 39.0–52.0)
Hemoglobin: 9.3 g/dL — ABNORMAL LOW (ref 13.0–17.0)
MCH: 26.8 pg (ref 26.0–34.0)
MCHC: 30.5 g/dL (ref 30.0–36.0)
MCV: 87.9 fL (ref 80.0–100.0)
Platelets: 185 10*3/uL (ref 150–400)
RBC: 3.47 MIL/uL — ABNORMAL LOW (ref 4.22–5.81)
RDW: 15.7 % — ABNORMAL HIGH (ref 11.5–15.5)
WBC: 7 10*3/uL (ref 4.0–10.5)
nRBC: 0 % (ref 0.0–0.2)

## 2023-07-09 LAB — POC OCCULT BLOOD, ED: Fecal Occult Bld: NEGATIVE

## 2023-07-09 LAB — HIV ANTIBODY (ROUTINE TESTING W REFLEX): HIV Screen 4th Generation wRfx: NONREACTIVE

## 2023-07-09 LAB — CBG MONITORING, ED
Glucose-Capillary: 75 mg/dL (ref 70–99)
Glucose-Capillary: 92 mg/dL (ref 70–99)

## 2023-07-09 LAB — BASIC METABOLIC PANEL
Anion gap: 9 (ref 5–15)
BUN: 14 mg/dL (ref 8–23)
CO2: 26 mmol/L (ref 22–32)
Calcium: 8.9 mg/dL (ref 8.9–10.3)
Chloride: 103 mmol/L (ref 98–111)
Creatinine, Ser: 0.69 mg/dL (ref 0.61–1.24)
GFR, Estimated: >60 mL/min (ref >60)
Glucose, Bld: 94 mg/dL (ref 70–99)
Potassium: 3.9 mmol/L (ref 3.5–5.1)
Sodium: 138 mmol/L (ref 135–145)

## 2023-07-09 LAB — I-STAT CG4 LACTIC ACID, ED
Lactic Acid, Venous: 0.9 mmol/L (ref 0.5–1.9)
Lactic Acid, Venous: 1 mmol/L (ref 0.5–1.9)

## 2023-07-09 LAB — HEMOGLOBIN A1C
Hgb A1c MFr Bld: 6.4 % — ABNORMAL HIGH (ref 4.8–5.6)
Mean Plasma Glucose: 136.98 mg/dL

## 2023-07-09 LAB — MAGNESIUM: Magnesium: 1.9 mg/dL (ref 1.7–2.4)

## 2023-07-09 MED ORDER — LACTATED RINGERS IV BOLUS
1000.0000 mL | Freq: Once | INTRAVENOUS | Status: AC
  Administered 2023-07-09: 1000 mL via INTRAVENOUS

## 2023-07-09 MED ORDER — METFORMIN HCL 500 MG PO TABS
1000.0000 mg | ORAL_TABLET | Freq: Two times a day (BID) | ORAL | Status: DC
  Administered 2023-07-09 – 2023-07-10 (×3): 1000 mg via ORAL
  Filled 2023-07-09 (×3): qty 2

## 2023-07-09 MED ORDER — TAMSULOSIN HCL 0.4 MG PO CAPS
0.4000 mg | ORAL_CAPSULE | Freq: Every day | ORAL | Status: DC
  Administered 2023-07-09 – 2023-07-10 (×2): 0.4 mg via ORAL
  Filled 2023-07-09 (×2): qty 1

## 2023-07-09 MED ORDER — ACETAMINOPHEN 325 MG PO TABS: 650.0000 mg | ORAL_TABLET | Freq: Four times a day (QID) | ORAL | Status: DC | PRN

## 2023-07-09 MED ORDER — GABAPENTIN 100 MG PO CAPS
100.0000 mg | ORAL_CAPSULE | Freq: Two times a day (BID) | ORAL | Status: DC
  Administered 2023-07-09 – 2023-07-16 (×16): 100 mg via ORAL
  Filled 2023-07-09 (×16): qty 1

## 2023-07-09 MED ORDER — POLYVINYL ALCOHOL 1.4 % OP SOLN: 1.0000 [drp] | Freq: Four times a day (QID) | OPHTHALMIC | Status: DC | PRN

## 2023-07-09 MED ORDER — INSULIN ASPART 100 UNIT/ML IJ SOLN: 0.0000 [IU] | Freq: Every day | INTRAMUSCULAR | Status: DC

## 2023-07-09 MED ORDER — BRIMONIDINE TARTRATE 0.2 % OP SOLN
1.0000 [drp] | Freq: Three times a day (TID) | OPHTHALMIC | Status: DC
  Administered 2023-07-09 – 2023-07-16 (×21): 1 [drp] via OPHTHALMIC
  Filled 2023-07-09: qty 5

## 2023-07-09 MED ORDER — HYDROCORTISONE ACETATE 25 MG RE SUPP: 25.0000 mg | Freq: Three times a day (TID) | RECTAL | Status: DC | PRN

## 2023-07-09 MED ORDER — ACETAMINOPHEN 650 MG RE SUPP: 650.0000 mg | Freq: Four times a day (QID) | RECTAL | Status: DC | PRN

## 2023-07-09 MED ORDER — INSULIN ASPART 100 UNIT/ML IJ SOLN
0.0000 [IU] | Freq: Three times a day (TID) | INTRAMUSCULAR | Status: DC
  Administered 2023-07-10: 1 [IU] via SUBCUTANEOUS
  Administered 2023-07-14: 2 [IU] via SUBCUTANEOUS

## 2023-07-09 MED ORDER — ESCITALOPRAM OXALATE 10 MG PO TABS
10.0000 mg | ORAL_TABLET | Freq: Every day | ORAL | Status: DC
  Administered 2023-07-09 – 2023-07-16 (×8): 10 mg via ORAL
  Filled 2023-07-09 (×8): qty 1

## 2023-07-09 MED ORDER — SODIUM CHLORIDE 0.9 % IV SOLN
1.0000 g | INTRAVENOUS | Status: DC
  Administered 2023-07-10 – 2023-07-12 (×3): 1 g via INTRAVENOUS
  Filled 2023-07-09 (×3): qty 10

## 2023-07-09 MED ORDER — NETARSUDIL-LATANOPROST 0.02-0.005 % OP SOLN: 1.0000 [drp] | Freq: Every day | OPHTHALMIC | Status: DC

## 2023-07-09 MED ORDER — SODIUM CHLORIDE 0.9 % IV SOLN
1.0000 g | Freq: Once | INTRAVENOUS | Status: AC
  Administered 2023-07-09: 1 g via INTRAVENOUS
  Filled 2023-07-09: qty 10

## 2023-07-09 MED ORDER — POLYETHYLENE GLYCOL 3350 17 G PO PACK
17.0000 g | PACK | Freq: Every day | ORAL | Status: DC
  Administered 2023-07-09 – 2023-07-14 (×6): 17 g via ORAL
  Filled 2023-07-09 (×7): qty 1

## 2023-07-09 MED ORDER — DORZOLAMIDE HCL-TIMOLOL MAL 2-0.5 % OP SOLN
1.0000 [drp] | Freq: Two times a day (BID) | OPHTHALMIC | Status: DC
  Administered 2023-07-09 – 2023-07-16 (×14): 1 [drp] via OPHTHALMIC
  Filled 2023-07-09: qty 10

## 2023-07-09 MED ORDER — AMLODIPINE BESYLATE 5 MG PO TABS
5.0000 mg | ORAL_TABLET | Freq: Every day | ORAL | Status: DC
  Administered 2023-07-10: 5 mg via ORAL
  Filled 2023-07-09: qty 1

## 2023-07-09 MED ORDER — MECLIZINE HCL 25 MG PO TABS
25.0000 mg | ORAL_TABLET | Freq: Once | ORAL | Status: AC
  Administered 2023-07-09: 25 mg via ORAL
  Filled 2023-07-09: qty 1

## 2023-07-09 NOTE — ED Notes (Signed)
Pt provided with crackers and soda per request

## 2023-07-09 NOTE — ED Provider Notes (Signed)
Care of patient assumed from Dr. Jeraldine Loots.  This patient presents after a mechanical fall with some presyncopal symptoms.  He is awaiting CT imaging results.  Discharge is anticipated. Physical Exam  BP 115/60   Pulse 73   Temp 98.6 F (37 C) (Oral)   Resp 15   SpO2 97%   Physical Exam Vitals and nursing note reviewed.  Constitutional:      General: He is not in acute distress.    Appearance: Normal appearance. He is well-developed. He is not ill-appearing, toxic-appearing or diaphoretic.  HENT:     Head: Normocephalic and atraumatic.     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     Left Ear: External ear normal.     Nose: Nose normal.     Mouth/Throat:     Mouth: Mucous membranes are moist.  Eyes:     Extraocular Movements: Extraocular movements intact.     Conjunctiva/sclera: Conjunctivae normal.  Cardiovascular:     Rate and Rhythm: Normal rate and regular rhythm.  Pulmonary:     Effort: Pulmonary effort is normal. No respiratory distress.  Abdominal:     General: There is no distension.     Palpations: Abdomen is soft.     Tenderness: There is no abdominal tenderness.  Musculoskeletal:        General: No swelling.     Cervical back: Normal range of motion and neck supple.  Skin:    General: Skin is warm.     Capillary Refill: Capillary refill takes less than 2 seconds.  Neurological:     Mental Status: He is alert.  Psychiatric:        Mood and Affect: Mood normal.     Procedures  Procedures  ED Course / MDM    Medical Decision Making Amount and/or Complexity of Data Reviewed Labs: ordered. Radiology: ordered.   On assessment, patient resting comfortably.  His blood pressures have been soft while here in the ED.  Per EMS, he has undergone recent adjustments to his blood pressure medications due to ongoing hypotension.  Per chart review, he currently takes HCTZ and Benicar.  He has been getting seen by  gastroenterology for history of GI bleeding.  They attempted a colonoscopy in July but were unable to do so due to poor colon prep.  His hemoglobin today does show a slight decrease.  CT imaging did not show any new findings.  Patient was informed of this.  He reports that at baseline, he ambulates with a cane.  When attempting to sit up, patient was unable to do so due to recurrence of dizziness.  He could not tolerate testing of orthostatic vital signs, even from sitting.  IV fluids and dose of meclizine were ordered.  On rectal exam, no melena is present.  Urinalysis does show evidence of infection.  Ceftriaxone was ordered.  Given his hypotension, additional blood pressure medications were ordered for possible sepsis, although lactate is normal and he has no leukocytosis.  Patient was admitted for further management.       Gloris Manchester, MD 07/09/23 [phone]

## 2023-07-09 NOTE — Progress Notes (Signed)
Pt placed on CPAP per order due to pt requesting CPAP for Nap. Pt placed on CPAP by RT. Pt tolerating well. Vitals stable, RN aware, RT will monitor as needed.     07/09/23 0955  Therapy Vitals  Pulse Rate (!) 58  Resp 16  MEWS Score/Color  MEWS Score 0  MEWS Score Color Green  Respiratory Assessment  Assessment Type Assess only  Respiratory Pattern Regular;Unlabored  Chest Assessment Chest expansion symmetrical  Cough None  Bilateral Breath Sounds Clear;Diminished  Oxygen Therapy/Pulse Ox  O2 Device (S)  CPAP  SpO2 92 %

## 2023-07-09 NOTE — ED Notes (Signed)
ED TO INPATIENT HANDOFF REPORT  ED Nurse Name and Phone #: Murlean Iba Paramedic [phone]  S Name/Age/Gender Xavier Turner 67 y.o. male Room/Bed: 007C/007C  Code Status   Code Status: Limited: Do not attempt resuscitation (DNR) -DNR-LIMITED -Do Not Intubate/DNI   Home/SNF/Other Skilled nursing facility Patient oriented to: self and place Is this baseline? Yes   Triage Complete: Triage complete  Chief Complaint UTI (urinary tract infection) [N39.0]  Triage Note Pt arrived via EMS from [facility]. Pt states he was standing up and felt dizzy causing him to fall and hit his head. Denies LOC, denies blood thinner. Pt arrived in c-collar. Pt c/o R sided facial pain, neck pain and mid back pain. Staff reports recent adjustments in his blood pressure medication due to hypotension.    Allergies Allergies  Allergen Reactions   Penicillin G Anaphylaxis    Not on the MAR    Level of Care/Admitting Diagnosis ED Disposition     ED Disposition  Admit   Condition  --   Comment  Hospital Area: [facility] [[id number]]  Level of Care: Telemetry Cardiac [103]  May place patient in observation at [facility] or [facility] if equivalent level of care is available:: Yes  Covid Evaluation: Asymptomatic - no recent exposure (last 10 days) testing not required  Diagnosis: UTI (urinary tract infection) [454098]  Admitting Physician: John Giovanni [[id number]]  Attending Physician: John Giovanni [[id number]]          B Medical/Surgery History Past Medical History:  Diagnosis Date   Anemia    Asthma    Chronic kidney disease    Diabetes (HCC)    Glaucoma    Hemiparesis (HCC)    Hypercholesterolemia    Hyperlipemia    Melanoma (HCC)    Melanoma (HCC)    Obesity    OSA (obstructive sleep apnea)    Seizure (HCC)    Seizure disorder (HCC)    Sturge syndrome (HCC)    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HCC)    Past Surgical History:  Procedure Laterality Date   AMPUTATION  Left 07/24/2019   Procedure: AMPUTATION RAY, fifth left;  Surgeon: Nadara Mustard, MD;  Location: [facility] OR;  Service: Orthopedics;  Laterality: Left;   COLONOSCOPY     CRANIOTOMY  1999   FLEXIBLE SIGMOIDOSCOPY  04/08/2023   Procedure: FLEXIBLE SIGMOIDOSCOPY;  Surgeon: Lanelle Bal, DO;  Location: [facility] SUITE;  Service: Endoscopy;;   melanoma removal  2011     A IV Location/Drains/Wounds Patient Lines/Drains/Airways Status     Active Line/Drains/Airways     Name Placement date Placement time Site Days   Peripheral IV 07/08/23 20 G Right Forearm 07/08/23  2030  Forearm  1   Wound / Incision (Open or Dehisced) 07/22/19 Diabetic ulcer Foot Left 07/22/19  1233  Foot  1448   Wound / Incision (Open or Dehisced) 12/14/21 (MASD) Moisture Associated Skin Damage Groin Anterior;Left;Right;Lateral 12/14/21  1949  Groin  572            Intake/Output Last 24 hours  Intake/Output Summary (Last 24 hours) at 07/09/2023 1225 Last data filed at 07/09/2023 0865 Gross per 24 hour  Intake 2100 ml  Output --  Net 2100 ml    Labs/Imaging Results for orders placed or performed during the hospital encounter of 07/08/23 (from the past 48 hour(s))  Comprehensive metabolic panel     Status: Abnormal   Collection Time: 07/08/23  8:49 PM  Result Value Ref Range  Sodium 133 (L) 135 - 145 mmol/L   Potassium 4.6 3.5 - 5.1 mmol/L   Chloride 97 (L) 98 - 111 mmol/L   CO2 26 22 - 32 mmol/L   Glucose, Bld 93 70 - 99 mg/dL    Comment: Glucose reference range applies only to samples taken after fasting for at least 8 hours.   BUN 20 8 - 23 mg/dL   Creatinine, Ser 0.98 0.61 - 1.24 mg/dL   Calcium 9.2 8.9 - 11.9 mg/dL   Total Protein 6.4 (L) 6.5 - 8.1 g/dL   Albumin 3.0 (L) 3.5 - 5.0 g/dL   AST 30 15 - 41 U/L   ALT 29 0 - 44 U/L   Alkaline Phosphatase 73 38 - 126 U/L   Total Bilirubin 0.8 0.3 - 1.2 mg/dL   GFR, Estimated >14 >78 mL/min    Comment: (NOTE) Calculated using the CKD-EPI Creatinine  Equation (2021)    Anion gap 10 5 - 15    Comment: Performed at [facility], [address], [place], [place] [address]  CBC     Status: Abnormal   Collection Time: 07/08/23  8:49 PM  Result Value Ref Range   WBC 8.4 4.0 - 10.5 K/uL   RBC 3.59 (L) 4.22 - 5.81 MIL/uL   Hemoglobin 10.2 (L) 13.0 - 17.0 g/dL   HCT 13.0 (L) 86.5 - 78.4 %   MCV 90.3 80.0 - 100.0 fL   MCH 28.4 26.0 - 34.0 pg   MCHC 31.5 30.0 - 36.0 g/dL   RDW 69.6 (H) 29.5 - 28.4 %   Platelets 188 150 - 400 K/uL   nRBC 0.0 0.0 - 0.2 %    Comment: Performed at [facility], [address] [place], [place] [address]  Protime-INR     Status: None   Collection Time: 07/08/23  8:49 PM  Result Value Ref Range   Prothrombin Time 14.4 11.4 - 15.2 seconds   INR 1.1 0.8 - 1.2    Comment: (NOTE) INR goal varies based on device and disease states. Performed at [facility], [address] [place], [place] [address]   POC occult blood, ED     Status: None   Collection Time: 07/09/23  2:15 AM  Result Value Ref Range   Fecal Occult Bld NEGATIVE NEGATIVE  Urinalysis, w/ Reflex to Culture (Infection Suspected) -Urine, Clean Catch     Status: Abnormal   Collection Time: 07/09/23  2:21 AM  Result Value Ref Range   Specimen Source URINE, CLEAN CATCH    Color, Urine YELLOW YELLOW   APPearance HAZY (A) CLEAR   Specific Gravity, Urine 1.005 1.005 - 1.030   pH 8.0 5.0 - 8.0   Glucose, UA >=500 (A) NEGATIVE mg/dL   Hgb urine dipstick SMALL (A) NEGATIVE   Bilirubin Urine NEGATIVE NEGATIVE   Ketones, ur NEGATIVE NEGATIVE mg/dL   Protein, ur NEGATIVE NEGATIVE mg/dL   Nitrite NEGATIVE NEGATIVE   Leukocytes,Ua LARGE (A) NEGATIVE   RBC / HPF 0-5 0 - 5 RBC/hpf   WBC, UA >50 0 - 5 WBC/hpf    Comment:        Reflex urine culture not performed if WBC <=10, OR if Squamous epithelial cells >5. If Squamous epithelial cells >5 suggest recollection.    Bacteria, UA FEW (A) NONE SEEN   Squamous Epithelial / HPF 0-5 0 - 5  /HPF    Comment: Performed at [facility], [address] [place], [place] [address]  Magnesium     Status: None   Collection Time: 07/09/23  2:21 AM  Result Value Ref Range   Magnesium 1.9 1.7 - 2.4 mg/dL    Comment: Performed at [facility], [address], [place], [place] [address]  I-Stat CG4 Lactic Acid     Status: None   Collection Time: 07/09/23  2:27 AM  Result Value Ref Range   Lactic Acid, Venous 0.9 0.5 - 1.9 mmol/L  I-Stat CG4 Lactic Acid     Status: None   Collection Time: 07/09/23  4:27 AM  Result Value Ref Range   Lactic Acid, Venous 1.0 0.5 - 1.9 mmol/L  HIV Antibody (routine testing w rflx)     Status: None   Collection Time: 07/09/23  5:25 AM  Result Value Ref Range   HIV Screen 4th Generation wRfx Non Reactive Non Reactive    Comment: Performed at [facility], [address], [place], [place] [address]  CBC     Status: Abnormal   Collection Time: 07/09/23  5:25 AM  Result Value Ref Range   WBC 7.0 4.0 - 10.5 K/uL   RBC 3.47 (L) 4.22 - 5.81 MIL/uL   Hemoglobin 9.3 (L) 13.0 - 17.0 g/dL   HCT 91.4 (L) 78.2 - 95.6 %   MCV 87.9 80.0 - 100.0 fL   MCH 26.8 26.0 - 34.0 pg   MCHC 30.5 30.0 - 36.0 g/dL   RDW 21.3 (H) 08.6 - 57.8 %   Platelets 185 150 - 400 K/uL   nRBC 0.0 0.0 - 0.2 %    Comment: Performed at [facility], [address] [place], [place] [address]  Hemoglobin A1c     Status: Abnormal   Collection Time: 07/09/23  5:25 AM  Result Value Ref Range   Hgb A1c MFr Bld 6.4 (H) 4.8 - 5.6 %    Comment: (NOTE) Pre diabetes:          5.7%-6.4%  Diabetes:              >6.4%  Glycemic control for   <7.0% adults with diabetes    Mean Plasma Glucose 136.98 mg/dL    Comment: Performed at [facility], [address], [place], [place] [address]  CBG monitoring, ED     Status: None   Collection Time: 07/09/23  7:43 AM  Result Value Ref Range   Glucose-Capillary 75 70 - 99 mg/dL    Comment: Glucose reference range applies  only to samples taken after fasting for at least 8 hours.  Basic metabolic panel     Status: None   Collection Time: 07/09/23  7:47 AM  Result Value Ref Range   Sodium 138 135 - 145 mmol/L   Potassium 3.9 3.5 - 5.1 mmol/L   Chloride 103 98 - 111 mmol/L   CO2 26 22 - 32 mmol/L   Glucose, Bld 94 70 - 99 mg/dL    Comment: Glucose reference range applies only to samples taken after fasting for at least 8 hours.   BUN 14 8 - 23 mg/dL   Creatinine, Ser 1.32 0.61 - 1.24 mg/dL   Calcium 8.9 8.9 - 44.0 mg/dL   GFR, Estimated >10 >27 mL/min    Comment: (NOTE) Calculated using the CKD-EPI Creatinine Equation (2021)    Anion gap 9 5 - 15    Comment: Performed at [facility], [address], [place], [place] [address]  CBG monitoring, ED     Status: None   Collection Time: 07/09/23 11:38 AM  Result Value Ref Range   Glucose-Capillary 92 70 - 99 mg/dL    Comment: Glucose reference range applies only to samples taken after fasting for at least 8 hours.   CT CERVICAL SPINE WO CONTRAST  Result Date: 07/09/2023 CLINICAL DATA:  Polytrauma, blunt.  Fall. EXAM: CT CERVICAL SPINE WITHOUT CONTRAST TECHNIQUE: Multidetector CT imaging of the cervical spine was performed without intravenous contrast. Multiplanar CT image reconstructions were also generated. RADIATION DOSE REDUCTION: This exam was performed according to the departmental dose-optimization program which includes automated exposure control, adjustment of the mA and/or kV according to patient size and/or use of iterative reconstruction technique. COMPARISON:  None Available. FINDINGS: Alignment: Normal Skull base and vertebrae: No acute fracture. No primary bone lesion or focal pathologic process. Soft tissues and spinal canal: No prevertebral fluid or swelling. No visible canal hematoma. Disc levels: Moderate to advanced degenerative disc disease with disc space narrowing and anterior spurring most pronounced in the lower cervical spine.  Diffuse moderate degenerative facet disease bilaterally. Upper chest: No acute findings Other: None IMPRESSION: Moderate to advanced degenerative disc and facet disease. No acute bony abnormality. Electronically Signed   By: Charlett Nose M.D.   On: 07/09/2023 01:12   CT MAXILLOFACIAL WO CONTRAST  Result Date: 07/09/2023 CLINICAL DATA:  Facial trauma, blunt fall. EXAM: CT MAXILLOFACIAL WITHOUT CONTRAST TECHNIQUE: Multidetector CT imaging of the maxillofacial structures was performed. Multiplanar CT image reconstructions were also generated. RADIATION DOSE REDUCTION: This exam was performed according to the departmental dose-optimization program which includes automated exposure control, adjustment of the mA and/or kV according to patient size and/or use of iterative reconstruction technique. COMPARISON:  None Available. FINDINGS: Osseous: No fracture or mandibular dislocation. No destructive process. Zygomatic arches and mandible intact. Prior right temporal craniotomy. Orbits: Exophthalmos of the right globe, stable since prior head CT. No fracture. Sinuses: No air-fluid levels. Soft tissues: Soft tissue swelling over the right face and orbit. Limited intracranial: See head CT report IMPRESSION: No facial or orbital fracture. Electronically Signed   By: Charlett Nose M.D.   On: 07/09/2023 01:10   CT HEAD WO CONTRAST  Result Date: 07/09/2023 CLINICAL DATA:  Head trauma, moderate-severe.  Fall. EXAM: CT HEAD WITHOUT CONTRAST TECHNIQUE: Contiguous axial images were obtained from the base of the skull through the vertex without intravenous contrast. RADIATION DOSE REDUCTION: This exam was performed according to the departmental dose-optimization program which includes automated exposure control, adjustment of the mA and/or kV according to patient size and/or use of iterative reconstruction technique. COMPARISON:  12/21/2022 FINDINGS: Brain: Encephalomalacia throughout much of the right cerebral hemisphere with  dystrophic calcifications, unchanged since prior study. 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 No acute intracranial abnormality. Specifically, no hemorrhage, hydrocephalus, mass lesion, acute infarction, or significant intracranial injury. Vascular: No hyperdense vessel or unexpected calcification. Skull: No acute calvarial abnormality. Sinuses/Orbits: No air-fluid levels. Exophthalmos of the right globe again noted, unchanged. No acute findings. Other: None IMPRESSION: Encephalomalacia and dystrophic calcifications throughout much of the right cerebral hemisphere. 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 Findings unchanged since prior study. No acute intracranial abnormality. Electronically Signed   By: Charlett Nose M.D.   On: 07/09/2023 01:08   DG Chest Port 1 View  Result Date: 07/08/2023 CLINICAL DATA:  Patient felt dizzy, fell, and hit his head. EXAM: PORTABLE CHEST 1 VIEW COMPARISON:  12/21/2022 FINDINGS: Shallow inspiration with probable atelectasis in the lung bases. Heart size and pulmonary vascularity are normal for technique. No focal consolidation or airspace disease. No blunting of costophrenic angles. No pneumothorax. Mediastinal contours appear intact. IMPRESSION: No active disease. Electronically Signed   By: Burman Nieves M.D.   On: 07/08/2023 23:25   DG Pelvis Portable  Result Date: 07/08/2023 CLINICAL DATA:  Trauma.  Patient fell dizzy, fell, and hit his head. EXAM: PORTABLE PELVIS 1-2 VIEWS COMPARISON:  None Available. FINDINGS: There is no evidence of pelvic fracture or diastasis. No pelvic bone lesions are seen. Prostate calcifications are present. IMPRESSION: Negative. Electronically Signed   By: Burman Nieves M.D.   On: 07/08/2023 23:24    Pending Labs Unresulted Labs (From admission, onward)     Start     Ordered   07/10/23 0500  CBC with Differential/Platelet  Daily,   R      07/09/23 0853   07/10/23 0500   Basic metabolic panel  Daily,   R      07/09/23 0853   07/09/23 0313  Blood culture (routine x 2)  BLOOD CULTURE X 2,   R (with STAT occurrences)      07/09/23 0312   07/09/23 0221  Urine Culture  Once,   R        07/09/23 0221            Vitals/Pain Today's Vitals   07/09/23 1010 07/09/23 1053 07/09/23 1106 07/09/23 1107  BP:      Pulse:   60 60  Resp:   14 14  Temp:  97.7 F (36.5 C)    TempSrc:  Axillary    SpO2: 94%  98% 98%  Weight:      Height:      PainSc:        Isolation Precautions No active isolations  Medications Medications  cefTRIAXone (ROCEPHIN) 1 g in sodium chloride 0.9 % 100 mL IVPB (has no administration in time range)  acetaminophen (TYLENOL) tablet 650 mg (has no administration in time range)    Or  acetaminophen (TYLENOL) suppository 650 mg (has no administration in time range)  insulin aspart (novoLOG) injection 0-9 Units ( Subcutaneous Not Given 07/09/23 1139)  insulin aspart (novoLOG) injection 0-5 Units (has no administration in time range)  gabapentin (NEURONTIN) capsule 100 mg (100 mg Oral Given 07/09/23 0956)  escitalopram (LEXAPRO) tablet 10 mg (10 mg Oral Given 07/09/23 0956)  polyethylene glycol (MIRALAX / GLYCOLAX) packet 17 g (17 g Oral Given 07/09/23 0956)  tamsulosin (FLOMAX) capsule 0.4 mg (0.4 mg Oral Given 07/09/23 0956)  brimonidine (ALPHAGAN) 0.2 % ophthalmic solution 1 drop (has no administration in time range)  dorzolamide-timolol (COSOPT) 2-0.5 % ophthalmic solution 1 drop (has no administration in time range)  carboxymethylcellulose (REFRESH PLUS) 0.5 % ophthalmic solution 1 drop (has no administration in time range)  Netarsudil-Latanoprost 0.02-0.005 % SOLN 1 drop (has no administration in time range)  hydrocortisone (ANUSOL-HC) suppository 25 mg (has no administration in time range)  metFORMIN (GLUCOPHAGE) tablet 1,000 mg (1,000 mg Oral Given 07/09/23 0955)  amLODipine (NORVASC) tablet 5 mg (has no administration in time  range)  lactated ringers bolus 1,000 mL (0 mLs Intravenous Stopped 07/09/23 0440)  meclizine (ANTIVERT) tablet 25 mg (25 mg Oral Given 07/09/23 0246)  cefTRIAXone (ROCEPHIN) 1 g in sodium chloride 0.9 % 100 mL IVPB (0 g Intravenous Stopped 07/09/23 0522)  lactated ringers bolus 1,000 mL (0 mLs Intravenous Stopped 07/09/23 0624)  Mobility Non-ambulatory     Focused Assessments Pulmonary Assessment Handoff:  Lung sounds: Bilateral Breath Sounds: Clear, Diminished O2 Device: CPAP O2 Flow Rate (L/min): (S) 3 L/min    R Recommendations: See Admitting Provider Note  Report given to:   Additional Notes: On CPAP while sleeping PRN.

## 2023-07-09 NOTE — Hospital Course (Signed)
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 Marletta Lor GI additionally and saw him on 06/06/2023 with a plan for outpatient colonoscopy Further complications nonpurulent LLE cellulitis 3/24 through 3/27 admission-it appears that she follows with Dr. Lilian Kapur of podiatry additionally  Brought in from [facility] with fall after being dizzy-placed in a c-collar-felt by ED that this was secondary to orthostasis polypharmacy secondary to Lasix HCTZ olmesartan-given 2 L fluid bolus

## 2023-07-09 NOTE — Progress Notes (Addendum)
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 Marletta Lor GI additionally and saw him on 06/06/2023 with a plan for outpatient colonoscopy Further complications nonpurulent LLE cellulitis 3/24 through 3/27 admission-it appears that she follows with Dr. Lilian Kapur of podiatry additionally  Brought in from [facility] with fall after being dizzy-placed in a c-collar-felt by ED that this was secondary to orthostasis polypharmacy secondary to Lasix HCTZ olmesartan-given 2 L fluid bolus  S feels fair tells me he has had some prior rectal bleeding in the past but nothing recent and he is supposed to get a colonoscopy No new changes to meds Feels a little better asking for something to eat  O/e BP 109/61   Pulse 70   Temp 98.2 F (36.8 C) (Oral)   Resp 14   Ht 5\' 5"  (1.651 m)   Wt 95.3 kg   SpO2 100%   BMI 34.95 kg/m  Awake coherent 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

## 2023-07-09 NOTE — ED Notes (Addendum)
RT called for CPAP as patient's O2 is dipping while sleeping. MD notified and order for CPAP requested. Patient placed onto [place] while sleeping.

## 2023-07-09 NOTE — H&P (Signed)
History and Physical    Xavier Turner:[id number] DOB: [birth year] DOA: 07/08/2023  PCP: System, Provider Not In  Chief Complaint: Fall  HPI: Xavier Turner is a 67 y.o. male with medical history significant of iron deficiency anemia, asthma, CKD, type 2 diabetes, glaucoma, hypertension, hyperlipidemia, melanoma, OSA, seizure disorder, Sturge-Weber syndrome, BPH, depression presents to the ED via EMS from his nursing facility after a fall.  Patient had an episode of dizziness/near syncope, fell and hit his head.  Patient unable to sit up in the ED due to recurrence of dizziness and could not tolerate checking orthostatic vital signs.  Hypotensive with SBP 80-90s.  No fever, tachycardia, tachypnea, or hypoxia.  Labs showing no leukocytosis, hemoglobin 10.2 (was 11.4 on labs done 3 months ago), FOBT negative, sodium 133 (slightly low on labs 3 months ago as well), glucose 93.  UA with large amount of leukocytes and microscopy showing >50 WBCs and few bacteria.  Urine culture pending.  Lactic acid normal.  Blood cultures collected.  Chest x-ray showing no active disease.  Pelvic x-ray negative for any traumatic injuries.  CT head showing no acute intracranial abnormality.  CT C-spine and maxillofacial negative for acute findings.  EKG showing sinus rhythm and no acute ischemic changes. Patient was given ceftriaxone, meclizine, and 2 L LR.  [facility] called to admit.  Patient states he had ordered pizza for dinner and as he stood up to get his food, he felt dizzy and fell flat on his face.  Denies loss of consciousness.  Denies palpitations, shortness of breath, or chest pain.  He continues to feel dizzy whenever he sits up but feels okay when lying in the bed.  Denies nausea, vomiting, abdominal pain, diarrhea, or any urinary symptoms.  Review of Systems:  Review of Systems  All other systems reviewed and are negative.   Past Medical History:  Diagnosis Date   Anemia    Asthma    Chronic kidney disease     Diabetes (HCC)    Glaucoma    Hemiparesis (HCC)    Hypercholesterolemia    Hyperlipemia    Melanoma (HCC)    Melanoma (HCC)    Obesity    OSA (obstructive sleep apnea)    Seizure (HCC)    Seizure disorder (HCC)    Sturge syndrome (HCC)    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HCC)     Past Surgical History:  Procedure Laterality Date   AMPUTATION Left 07/24/2019   Procedure: AMPUTATION RAY, fifth left;  Surgeon: Nadara Mustard, MD;  Location: [facility] OR;  Service: Orthopedics;  Laterality: Left;   COLONOSCOPY     CRANIOTOMY  1999   FLEXIBLE SIGMOIDOSCOPY  04/08/2023   Procedure: FLEXIBLE SIGMOIDOSCOPY;  Surgeon: Lanelle Bal, DO;  Location: [facility];  Service: Endoscopy;;   melanoma removal  2011     reports that he has never smoked. He has never used smokeless tobacco. He reports that he does not currently use alcohol. He reports that he does not currently use drugs.  Allergies  Allergen Reactions   Penicillin G Anaphylaxis    Family History  Problem Relation Age of Onset   Emphysema Father    Cancer Father    Asthma Mother     Prior to Admission medications   Medication Sig Start Date End Date Taking? Authorizing Provider  aspirin 81 MG chewable tablet Chew 81 mg by mouth daily.    [provider]  brimonidine (ALPHAGAN) 0.2 % ophthalmic solution Place 1  drop into both eyes 3 (three) times daily. 08/28/19   Margit Hanks, MD  carboxymethylcellulose (REFRESH PLUS) 0.5 % SOLN Place 1 drop into both eyes every 6 (six) hours as needed (dry eyes).    [provider]  cholecalciferol (VITAMIN D3) 25 MCG (1000 UT) tablet Take 1 tablet (1,000 Units total) by mouth daily. 08/28/19   Margit Hanks, MD  dorzolamide-timolol (COSOPT) 22.3-6.8 MG/ML ophthalmic solution Place 1 drop into the right eye 2 (two) times daily. 08/28/19   Margit Hanks, MD  Emollient (EUCERIN) lotion Apply 1 Application topically as needed for dry skin.    [provider]   escitalopram (LEXAPRO) 10 MG tablet Take 10 mg by mouth daily. 12/20/21   [provider]  fenofibrate (TRICOR) 48 MG tablet Take 48 mg by mouth daily.    [provider]  furosemide (LASIX) 20 MG tablet Take 20 mg by mouth daily.    [provider]  gabapentin (NEURONTIN) 100 MG capsule Take 1 capsule (100 mg total) by mouth 2 (two) times daily. Patient taking differently: Take 100 mg by mouth 3 (three) times daily. 08/28/19   Margit Hanks, MD  hydrochlorothiazide (HYDRODIURIL) 25 MG tablet Take 1 tablet (25 mg total) by mouth daily. 08/28/19   Margit Hanks, MD  JARDIANCE 10 MG TABS tablet Take 10 mg by mouth daily. 04/03/21   [provider]  KERENDIA 10 MG TABS Take 10 mg by mouth daily.    [provider]  melatonin 3 MG TABS tablet Take 6 mg by mouth at bedtime.    [provider]  meloxicam (MOBIC) 15 MG tablet Take 1 tablet (15 mg total) by mouth daily. Patient not taking: Reported on 06/06/2023 12/30/19   Felecia Shelling, DPM  metFORMIN (GLUCOPHAGE) 1000 MG tablet Take 1 tablet (1,000 mg total) by mouth 2 (two) times daily with a meal. 08/28/19   Margit Hanks, MD  Misc. Devices MISC Please supply patient with shower chair with U-shaped seat. 10/07/19   [provider]  Multiple Vitamin (MULTIVITAMIN) TABS Take 1 tablet by mouth daily with breakfast.    [provider]  olmesartan (BENICAR) 5 MG tablet Take 5 mg by mouth daily. Patient not taking: Reported on 06/06/2023 12/13/22 12/13/23  [provider]  omeprazole (PRILOSEC) 20 MG capsule Take 20 mg by mouth daily. 03/14/23   [provider]  polyethylene glycol (MIRALAX / GLYCOLAX) 17 g packet Take 17 g by mouth daily. Mix in 8 ounces of water. 06/06/23   Aida Raider, NP  potassium chloride SA (KLOR-CON M) 20 MEQ tablet Take 40 mEq by mouth daily.    [provider]  psyllium (REGULOID) 0.52 g capsule Take 0.52 g by mouth daily.     [provider]  ROCKLATAN 0.02-0.005 % SOLN Place 1 drop into both eyes at bedtime. 08/28/19   Margit Hanks, MD  rosuvastatin (CRESTOR) 20 MG tablet Take 1 tablet (20 mg total) by mouth daily. 08/28/19   Margit Hanks, MD  rosuvastatin (CRESTOR) 40 MG tablet Take 40 mg by mouth at bedtime. 03/14/23   [provider]  sitaGLIPtin (JANUVIA) 100 MG tablet Take 1 tablet (100 mg total) by mouth daily. 08/28/19   Margit Hanks, MD  tamsulosin (FLOMAX) 0.4 MG CAPS capsule Take 1 capsule (0.4 mg total) by mouth daily. 08/28/19   Margit Hanks, MD    Physical Exam: Vitals:   07/09/23 0030 07/09/23  0200 07/09/23 0216 07/09/23 0330  BP: (!) 99/56 (!) 84/65  115/60  Pulse: 62 72  73  Resp: 15 (!) 21  15  Temp:   98.6 F (37 C)   TempSrc:   Oral   SpO2: 96% 98%  97%    Physical Exam Vitals reviewed.  Constitutional:      General: He is not in acute distress. HENT:     Head: Normocephalic.     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  Does have some swelling of his right forehead. Eyes:     Extraocular Movements: Extraocular movements intact.  Cardiovascular:     Rate and Rhythm: Normal rate and regular rhythm.     Pulses: Normal pulses.  Pulmonary:     Effort: Pulmonary effort is normal. No respiratory distress.     Breath sounds: Normal breath sounds. No wheezing or rales.  Abdominal:     General: Bowel sounds are normal. There is no distension.     Palpations: Abdomen is soft.     Tenderness: There is no abdominal tenderness.  Musculoskeletal:     Cervical back: Normal range of motion.     Right lower leg: Edema present.     Left lower leg: Edema present.     Comments: 2+ pitting edema of bilateral lower legs  Skin:    General: Skin is warm and dry.  Neurological:     General: No focal deficit present.     Mental Status: He is alert and oriented to person, place, and time.     Cranial Nerves: No cranial nerve deficit.     Sensory:  No sensory deficit.     Motor: No weakness.     Labs on Admission: I have personally reviewed following labs and imaging studies  CBC: Recent Labs  Lab 07/08/23 2049  WBC 8.4  HGB 10.2*  HCT 32.4*  MCV 90.3  PLT 188   Basic Metabolic Panel: Recent Labs  Lab 07/08/23 2049 07/09/23 0221  NA 133*  --   K 4.6  --   CL 97*  --   CO2 26  --   GLUCOSE 93  --   BUN 20  --   CREATININE 0.83  --   CALCIUM 9.2  --   MG  --  1.9   GFR: CrCl cannot be calculated (Unknown ideal weight.). Liver Function Tests: Recent Labs  Lab 07/08/23 2049  AST 30  ALT 29  ALKPHOS 73  BILITOT 0.8  PROT 6.4*  ALBUMIN 3.0*   No results for input(s): "LIPASE", "AMYLASE" in the last 168 hours. No results for input(s): "AMMONIA" in the last 168 hours. Coagulation Profile: Recent Labs  Lab 07/08/23 2049  INR 1.1   Cardiac Enzymes: No results for input(s): "CKTOTAL", "CKMB", "CKMBINDEX", "TROPONINI" in the last 168 hours. BNP (last 3 results) No results for input(s): "PROBNP" in the last 8760 hours. HbA1C: No results for input(s): "HGBA1C" in the last 72 hours. CBG: No results for input(s): "GLUCAP" in the last 168 hours. Lipid Profile: No results for input(s): "CHOL", "HDL", "LDLCALC", "TRIG", "CHOLHDL", "LDLDIRECT" in the last 72 hours. Thyroid Function Tests: No results for input(s): "TSH", "T4TOTAL", "FREET4", "T3FREE", "THYROIDAB" in the last 72 hours. Anemia Panel: No results for input(s): "VITAMINB12", "FOLATE", "FERRITIN", "TIBC", "IRON", "RETICCTPCT" in the last 72 hours. Urine analysis:    Component Value Date/Time   COLORURINE YELLOW 07/09/2023 0221   APPEARANCEUR HAZY (A) 07/09/2023 0221   LABSPEC 1.005 07/09/2023  0221   PHURINE 8.0 07/09/2023 0221   GLUCOSEU >=500 (A) 07/09/2023 0221   HGBUR SMALL (A) 07/09/2023 0221   BILIRUBINUR NEGATIVE 07/09/2023 0221   KETONESUR NEGATIVE 07/09/2023 0221   PROTEINUR NEGATIVE 07/09/2023 0221   NITRITE NEGATIVE 07/09/2023 0221    LEUKOCYTESUR LARGE (A) 07/09/2023 0221    Radiological Exams on Admission: CT CERVICAL SPINE WO CONTRAST  Result Date: 07/09/2023 CLINICAL DATA:  Polytrauma, blunt.  Fall. EXAM: CT CERVICAL SPINE WITHOUT CONTRAST TECHNIQUE: Multidetector CT imaging of the cervical spine was performed without intravenous contrast. Multiplanar CT image reconstructions were also generated. RADIATION DOSE REDUCTION: This exam was performed according to the departmental dose-optimization program which includes automated exposure control, adjustment of the mA and/or kV according to patient size and/or use of iterative reconstruction technique. COMPARISON:  None Available. FINDINGS: Alignment: Normal Skull base and vertebrae: No acute fracture. No primary bone lesion or focal pathologic process. Soft tissues and spinal canal: No prevertebral fluid or swelling. No visible canal hematoma. Disc levels: Moderate to advanced degenerative disc disease with disc space narrowing and anterior spurring most pronounced in the lower cervical spine. Diffuse moderate degenerative facet disease bilaterally. Upper chest: No acute findings Other: None IMPRESSION: Moderate to advanced degenerative disc and facet disease. No acute bony abnormality. Electronically Signed   By: Charlett Nose M.D.   On: 07/09/2023 01:12   CT MAXILLOFACIAL WO CONTRAST  Result Date: 07/09/2023 CLINICAL DATA:  Facial trauma, blunt fall. EXAM: CT MAXILLOFACIAL WITHOUT CONTRAST TECHNIQUE: Multidetector CT imaging of the maxillofacial structures was performed. Multiplanar CT image reconstructions were also generated. RADIATION DOSE REDUCTION: This exam was performed according to the departmental dose-optimization program which includes automated exposure control, adjustment of the mA and/or kV according to patient size and/or use of iterative reconstruction technique. COMPARISON:  None Available. FINDINGS: Osseous: No fracture or mandibular dislocation. No destructive  process. Zygomatic arches and mandible intact. Prior right temporal craniotomy. Orbits: Exophthalmos of the right globe, stable since prior head CT. No fracture. Sinuses: No air-fluid levels. Soft tissues: Soft tissue swelling over the right face and orbit. Limited intracranial: See head CT report IMPRESSION: No facial or orbital fracture. Electronically Signed   By: Charlett Nose M.D.   On: 07/09/2023 01:10   CT HEAD WO CONTRAST  Result Date: 07/09/2023 CLINICAL DATA:  Head trauma, moderate-severe.  Fall. EXAM: CT HEAD WITHOUT CONTRAST TECHNIQUE: Contiguous axial images were obtained from the base of the skull through the vertex without intravenous contrast. RADIATION DOSE REDUCTION: This exam was performed according to the departmental dose-optimization program which includes automated exposure control, adjustment of the mA and/or kV according to patient size and/or use of iterative reconstruction technique. COMPARISON:  12/21/2022 FINDINGS: Brain: Encephalomalacia throughout much of the right cerebral hemisphere with dystrophic calcifications, unchanged since prior study. 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 No acute intracranial abnormality. Specifically, no hemorrhage, hydrocephalus, mass lesion, acute infarction, or significant intracranial injury. Vascular: No hyperdense vessel or unexpected calcification. Skull: No acute calvarial abnormality. Sinuses/Orbits: No air-fluid levels. Exophthalmos of the right globe again noted, unchanged. No acute findings. Other: None IMPRESSION: Encephalomalacia and dystrophic calcifications throughout much of the right cerebral hemisphere. 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 Findings unchanged since prior study. No acute intracranial abnormality. Electronically Signed   By: Charlett Nose M.D.   On: 07/09/2023 01:08   DG Chest Port 1 View  Result Date: 07/08/2023 CLINICAL DATA:  Patient felt dizzy, fell,  and  hit his head. EXAM: PORTABLE CHEST 1 VIEW COMPARISON:  12/21/2022 FINDINGS: Shallow inspiration with probable atelectasis in the lung bases. Heart size and pulmonary vascularity are normal for technique. No focal consolidation or airspace disease. No blunting of costophrenic angles. No pneumothorax. Mediastinal contours appear intact. IMPRESSION: No active disease. Electronically Signed   By: Burman Nieves M.D.   On: 07/08/2023 23:25   DG Pelvis Portable  Result Date: 07/08/2023 CLINICAL DATA:  Trauma.  Patient fell dizzy, fell, and hit his head. EXAM: PORTABLE PELVIS 1-2 VIEWS COMPARISON:  None Available. FINDINGS: There is no evidence of pelvic fracture or diastasis. No pelvic bone lesions are seen. Prostate calcifications are present. IMPRESSION: Negative. Electronically Signed   By: Burman Nieves M.D.   On: 07/08/2023 23:24    Assessment and Plan  Dizziness/near syncope Likely due to orthostasis/polypharmacy.  He is on Lasix, hydrochlorothiazide, and olmesartan.  Patient is asymptomatic when lying in bed but unable to sit up due to recurrence of dizziness and could not tolerate checking orthostatic vital signs.   SBP 80-90s on arrival to the ED.  Hypotension has resolved after 2 L IV fluids.  EKG showing sinus rhythm and no acute ischemic changes. Echo done in January 2024 showing normal EF and no significant valvular abnormalities.  Continue cardiac monitoring and hold home antihypertensives.  Fall precautions.  Possible UTI Patient is not endorsing urinary symptoms but UA with evidence of pyuria and bacteriuria.  No fever, tachycardia, leukocytosis, or lactic acidosis to suggest sepsis.  Continue ceftriaxone.  Follow-up urine and blood cultures.  Chronic anemia Hemoglobin currently 10.2, was 11.4 on labs 3 months ago.  FOBT negative.  Continue to monitor labs.  Type 2 diabetes A1c 6.6 on labs done 4 years ago, repeat ordered.  Sensitive sliding scale insulin ACHS.  Asthma:  Stable, no signs of acute exacerbation. Glaucoma Hyperlipidemia History of seizure disorder BPH Depression Pharmacy med rec pending.  DVT prophylaxis: SCDs Code Status: Discussed CODE STATUS with the patient and he wishes to be DNR/DNI. Level of care: Telemetry bed Admission status: It is my clinical opinion that referral for OBSERVATION is reasonable and necessary in this patient based on the above information provided. The aforementioned taken together are felt to place the patient at high risk for further clinical deterioration. However, it is anticipated that the patient may be medically stable for discharge from the hospital within 24 to 48 hours.  John Giovanni MD [facility]  If 7PM-7AM, please contact night-coverage [email or web address]  07/09/2023, 3:46 AM

## 2023-07-09 NOTE — ED Notes (Signed)
ED TO INPATIENT HANDOFF REPORT  ED Nurse Name and Phone #:  Theophilus Bones [phone]  S Name/Age/Gender Xavier Turner 67 y.o. male Room/Bed: 037C/037C  Code Status   Code Status: Limited: Do not attempt resuscitation (DNR) -DNR-LIMITED -Do Not Intubate/DNI   Home/SNF/Other Nursing Home Patient oriented to: self, place, time, and situation Is this baseline? Yes   Triage Complete: Triage complete  Chief Complaint UTI (urinary tract infection) [N39.0]  Triage Note Pt arrived via EMS from [facility]. Pt states he was standing up and felt dizzy causing him to fall and hit his head. Denies LOC, denies blood thinner. Pt arrived in c-collar. Pt c/o R sided facial pain, neck pain and mid back pain. Staff reports recent adjustments in his blood pressure medication due to hypotension.    Allergies Allergies  Allergen Reactions   Penicillin G Anaphylaxis    Not on the MAR    Level of Care/Admitting Diagnosis ED Disposition     ED Disposition  Admit   Condition  --   Comment  Hospital Area: [facility] [[id number]]  Level of Care: Telemetry Cardiac [[id number]]  May place patient in observation at [facility] or [facility] if equivalent level of care is available:: Yes  Covid Evaluation: Asymptomatic - no recent exposure (last 10 days) testing not required  Diagnosis: UTI (urinary tract infection) [469629]  Admitting Physician: John Giovanni [[id number]]  Attending Physician: John Giovanni [[id number]]          B Medical/Surgery History Past Medical History:  Diagnosis Date   Anemia    Asthma    Chronic kidney disease    Diabetes (HCC)    Glaucoma    Hemiparesis (HCC)    Hypercholesterolemia    Hyperlipemia    Melanoma (HCC)    Melanoma (HCC)    Obesity    OSA (obstructive sleep apnea)    Seizure (HCC)    Seizure disorder (HCC)    Sturge syndrome (HCC)    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HCC)    Past Surgical History:  Procedure Laterality Date   AMPUTATION  Left 07/24/2019   Procedure: AMPUTATION RAY, fifth left;  Surgeon: Nadara Mustard, MD;  Location: [facility] OR;  Service: Orthopedics;  Laterality: Left;   COLONOSCOPY     CRANIOTOMY  1999   FLEXIBLE SIGMOIDOSCOPY  04/08/2023   Procedure: FLEXIBLE SIGMOIDOSCOPY;  Surgeon: Lanelle Bal, DO;  Location: [facility] SUITE;  Service: Endoscopy;;   melanoma removal  2011     A IV Location/Drains/Wounds Patient Lines/Drains/Airways Status     Active Line/Drains/Airways     Name Placement date Placement time Site Days   Peripheral IV 07/08/23 20 G Right Forearm 07/08/23  2030  Forearm  1   Wound / Incision (Open or Dehisced) 07/22/19 Diabetic ulcer Foot Left 07/22/19  1233  Foot  1448   Wound / Incision (Open or Dehisced) 12/14/21 (MASD) Moisture Associated Skin Damage Groin Anterior;Left;Right;Lateral 12/14/21  1949  Groin  572            Intake/Output Last 24 hours  Intake/Output Summary (Last 24 hours) at 07/09/2023 1333 Last data filed at 07/09/2023 2536 Gross per 24 hour  Intake 2100 ml  Output --  Net 2100 ml    Labs/Imaging Results for orders placed or performed during the hospital encounter of 07/08/23 (from the past 48 hour(s))  Comprehensive metabolic panel     Status: Abnormal   Collection Time: 07/08/23  8:49 PM  Result Value Ref Range  Sodium 133 (L) 135 - 145 mmol/L   Potassium 4.6 3.5 - 5.1 mmol/L   Chloride 97 (L) 98 - 111 mmol/L   CO2 26 22 - 32 mmol/L   Glucose, Bld 93 70 - 99 mg/dL    Comment: Glucose reference range applies only to samples taken after fasting for at least 8 hours.   BUN 20 8 - 23 mg/dL   Creatinine, Ser 1.61 0.61 - 1.24 mg/dL   Calcium 9.2 8.9 - 09.6 mg/dL   Total Protein 6.4 (L) 6.5 - 8.1 g/dL   Albumin 3.0 (L) 3.5 - 5.0 g/dL   AST 30 15 - 41 U/L   ALT 29 0 - 44 U/L   Alkaline Phosphatase 73 38 - 126 U/L   Total Bilirubin 0.8 0.3 - 1.2 mg/dL   GFR, Estimated >04 >54 mL/min    Comment: (NOTE) Calculated using the CKD-EPI Creatinine  Equation (2021)    Anion gap 10 5 - 15    Comment: Performed at [facility], [address], [place], [place] [address]  CBC     Status: Abnormal   Collection Time: 07/08/23  8:49 PM  Result Value Ref Range   WBC 8.4 4.0 - 10.5 K/uL   RBC 3.59 (L) 4.22 - 5.81 MIL/uL   Hemoglobin 10.2 (L) 13.0 - 17.0 g/dL   HCT 91.4 (L) 78.2 - 95.6 %   MCV 90.3 80.0 - 100.0 fL   MCH 28.4 26.0 - 34.0 pg   MCHC 31.5 30.0 - 36.0 g/dL   RDW 21.3 (H) 08.6 - 57.8 %   Platelets 188 150 - 400 K/uL   nRBC 0.0 0.0 - 0.2 %    Comment: Performed at [facility], [address], [place], [place] [address]  Protime-INR     Status: None   Collection Time: 07/08/23  8:49 PM  Result Value Ref Range   Prothrombin Time 14.4 11.4 - 15.2 seconds   INR 1.1 0.8 - 1.2    Comment: (NOTE) INR goal varies based on device and disease states. Performed at [facility], [address] [place], [place] [address]   POC occult blood, ED     Status: None   Collection Time: 07/09/23  2:15 AM  Result Value Ref Range   Fecal Occult Bld NEGATIVE NEGATIVE  Urinalysis, w/ Reflex to Culture (Infection Suspected) -Urine, Clean Catch     Status: Abnormal   Collection Time: 07/09/23  2:21 AM  Result Value Ref Range   Specimen Source URINE, CLEAN CATCH    Color, Urine YELLOW YELLOW   APPearance HAZY (A) CLEAR   Specific Gravity, Urine 1.005 1.005 - 1.030   pH 8.0 5.0 - 8.0   Glucose, UA >=500 (A) NEGATIVE mg/dL   Hgb urine dipstick SMALL (A) NEGATIVE   Bilirubin Urine NEGATIVE NEGATIVE   Ketones, ur NEGATIVE NEGATIVE mg/dL   Protein, ur NEGATIVE NEGATIVE mg/dL   Nitrite NEGATIVE NEGATIVE   Leukocytes,Ua LARGE (A) NEGATIVE   RBC / HPF 0-5 0 - 5 RBC/hpf   WBC, UA >50 0 - 5 WBC/hpf    Comment:        Reflex urine culture not performed if WBC <=10, OR if Squamous epithelial cells >5. If Squamous epithelial cells >5 suggest recollection.    Bacteria, UA FEW (A) NONE SEEN   Squamous Epithelial / HPF 0-5 0 - 5  /HPF    Comment: Performed at [facility], [address] [place], [place] [address]  Magnesium     Status: None   Collection Time: 07/09/23  2:21 AM  Result Value Ref Range   Magnesium 1.9 1.7 - 2.4 mg/dL    Comment: Performed at [facility], [address] [place], [place] [address]  I-Stat CG4 Lactic Acid     Status: None   Collection Time: 07/09/23  2:27 AM  Result Value Ref Range   Lactic Acid, Venous 0.9 0.5 - 1.9 mmol/L  I-Stat CG4 Lactic Acid     Status: None   Collection Time: 07/09/23  4:27 AM  Result Value Ref Range   Lactic Acid, Venous 1.0 0.5 - 1.9 mmol/L  HIV Antibody (routine testing w rflx)     Status: None   Collection Time: 07/09/23  5:25 AM  Result Value Ref Range   HIV Screen 4th Generation wRfx Non Reactive Non Reactive    Comment: Performed at [facility], [address], [place], [place] [address]  CBC     Status: Abnormal   Collection Time: 07/09/23  5:25 AM  Result Value Ref Range   WBC 7.0 4.0 - 10.5 K/uL   RBC 3.47 (L) 4.22 - 5.81 MIL/uL   Hemoglobin 9.3 (L) 13.0 - 17.0 g/dL   HCT 91.4 (L) 78.2 - 95.6 %   MCV 87.9 80.0 - 100.0 fL   MCH 26.8 26.0 - 34.0 pg   MCHC 30.5 30.0 - 36.0 g/dL   RDW 21.3 (H) 08.6 - 57.8 %   Platelets 185 150 - 400 K/uL   nRBC 0.0 0.0 - 0.2 %    Comment: Performed at [facility], [address] [place], [place] [address]  Hemoglobin A1c     Status: Abnormal   Collection Time: 07/09/23  5:25 AM  Result Value Ref Range   Hgb A1c MFr Bld 6.4 (H) 4.8 - 5.6 %    Comment: (NOTE) Pre diabetes:          5.7%-6.4%  Diabetes:              >6.4%  Glycemic control for   <7.0% adults with diabetes    Mean Plasma Glucose 136.98 mg/dL    Comment: Performed at [facility], [address], [place], [place] [address]  CBG monitoring, ED     Status: None   Collection Time: 07/09/23  7:43 AM  Result Value Ref Range   Glucose-Capillary 75 70 - 99 mg/dL    Comment: Glucose reference range applies  only to samples taken after fasting for at least 8 hours.  Basic metabolic panel     Status: None   Collection Time: 07/09/23  7:47 AM  Result Value Ref Range   Sodium 138 135 - 145 mmol/L   Potassium 3.9 3.5 - 5.1 mmol/L   Chloride 103 98 - 111 mmol/L   CO2 26 22 - 32 mmol/L   Glucose, Bld 94 70 - 99 mg/dL    Comment: Glucose reference range applies only to samples taken after fasting for at least 8 hours.   BUN 14 8 - 23 mg/dL   Creatinine, Ser 1.32 0.61 - 1.24 mg/dL   Calcium 8.9 8.9 - 44.0 mg/dL   GFR, Estimated >10 >27 mL/min    Comment: (NOTE) Calculated using the CKD-EPI Creatinine Equation (2021)    Anion gap 9 5 - 15    Comment: Performed at [facility], [address] [place], [place] [address]  CBG monitoring, ED     Status: None   Collection Time: 07/09/23 11:38 AM  Result Value Ref Range   Glucose-Capillary 92 70 - 99 mg/dL    Comment: Glucose reference range applies only to samples taken after fasting for at least 8 hours.   CT CERVICAL SPINE WO CONTRAST  Result Date: 07/09/2023 CLINICAL DATA:  Polytrauma, blunt.  Fall. EXAM: CT CERVICAL SPINE WITHOUT CONTRAST TECHNIQUE: Multidetector CT imaging of the cervical spine was performed without intravenous contrast. Multiplanar CT image reconstructions were also generated. RADIATION DOSE REDUCTION: This exam was performed according to the departmental dose-optimization program which includes automated exposure control, adjustment of the mA and/or kV according to patient size and/or use of iterative reconstruction technique. COMPARISON:  None Available. FINDINGS: Alignment: Normal Skull base and vertebrae: No acute fracture. No primary bone lesion or focal pathologic process. Soft tissues and spinal canal: No prevertebral fluid or swelling. No visible canal hematoma. Disc levels: Moderate to advanced degenerative disc disease with disc space narrowing and anterior spurring most pronounced in the lower cervical spine.  Diffuse moderate degenerative facet disease bilaterally. Upper chest: No acute findings Other: None IMPRESSION: Moderate to advanced degenerative disc and facet disease. No acute bony abnormality. Electronically Signed   By: Charlett Nose M.D.   On: 07/09/2023 01:12   CT MAXILLOFACIAL WO CONTRAST  Result Date: 07/09/2023 CLINICAL DATA:  Facial trauma, blunt fall. EXAM: CT MAXILLOFACIAL WITHOUT CONTRAST TECHNIQUE: Multidetector CT imaging of the maxillofacial structures was performed. Multiplanar CT image reconstructions were also generated. RADIATION DOSE REDUCTION: This exam was performed according to the departmental dose-optimization program which includes automated exposure control, adjustment of the mA and/or kV according to patient size and/or use of iterative reconstruction technique. COMPARISON:  None Available. FINDINGS: Osseous: No fracture or mandibular dislocation. No destructive process. Zygomatic arches and mandible intact. Prior right temporal craniotomy. Orbits: Exophthalmos of the right globe, stable since prior head CT. No fracture. Sinuses: No air-fluid levels. Soft tissues: Soft tissue swelling over the right face and orbit. Limited intracranial: See head CT report IMPRESSION: No facial or orbital fracture. Electronically Signed   By: Charlett Nose M.D.   On: 07/09/2023 01:10   CT HEAD WO CONTRAST  Result Date: 07/09/2023 CLINICAL DATA:  Head trauma, moderate-severe.  Fall. EXAM: CT HEAD WITHOUT CONTRAST TECHNIQUE: Contiguous axial images were obtained from the base of the skull through the vertex without intravenous contrast. RADIATION DOSE REDUCTION: This exam was performed according to the departmental dose-optimization program which includes automated exposure control, adjustment of the mA and/or kV according to patient size and/or use of iterative reconstruction technique. COMPARISON:  12/21/2022 FINDINGS: Brain: Encephalomalacia throughout much of the right cerebral hemisphere with  dystrophic calcifications, unchanged since prior study. 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 No acute intracranial abnormality. Specifically, no hemorrhage, hydrocephalus, mass lesion, acute infarction, or significant intracranial injury. Vascular: No hyperdense vessel or unexpected calcification. Skull: No acute calvarial abnormality. Sinuses/Orbits: No air-fluid levels. Exophthalmos of the right globe again noted, unchanged. No acute findings. Other: None IMPRESSION: Encephalomalacia and dystrophic calcifications throughout much of the right cerebral hemisphere. 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 Findings unchanged since prior study. No acute intracranial abnormality. Electronically Signed   By: Charlett Nose M.D.   On: 07/09/2023 01:08   DG Chest Port 1 View  Result Date: 07/08/2023 CLINICAL DATA:  Patient felt dizzy, fell, and hit his head. EXAM: PORTABLE CHEST 1 VIEW COMPARISON:  12/21/2022 FINDINGS: Shallow inspiration with probable atelectasis in the lung bases. Heart size and pulmonary vascularity are normal for technique. No focal consolidation or airspace disease. No blunting of costophrenic angles. No pneumothorax. Mediastinal contours appear intact. IMPRESSION: No active disease. Electronically Signed   By: Burman Nieves M.D.   On: 07/08/2023 23:25   DG Pelvis Portable  Result Date: 07/08/2023 CLINICAL DATA:  Trauma.  Patient fell dizzy, fell, and hit his head. EXAM: PORTABLE PELVIS 1-2 VIEWS COMPARISON:  None Available. FINDINGS: There is no evidence of pelvic fracture or diastasis. No pelvic bone lesions are seen. Prostate calcifications are present. IMPRESSION: Negative. Electronically Signed   By: Burman Nieves M.D.   On: 07/08/2023 23:24    Pending Labs Unresulted Labs (From admission, onward)     Start     Ordered   07/10/23 0500  CBC with Differential/Platelet  Daily,   R      07/09/23 0853   07/10/23 0500   Basic metabolic panel  Daily,   R      07/09/23 0853   07/09/23 0313  Blood culture (routine x 2)  BLOOD CULTURE X 2,   R (with STAT occurrences)      07/09/23 0312   07/09/23 0221  Urine Culture  Once,   R        07/09/23 0221            Vitals/Pain Today's Vitals   07/09/23 1053 07/09/23 1106 07/09/23 1107 07/09/23 1302  BP:    (!) 98/53  Pulse:  60 60 69  Resp:  14 14 15   Temp: 97.7 F (36.5 C)     TempSrc: Axillary     SpO2:  98% 98% 100%  Weight:      Height:      PainSc:        Isolation Precautions No active isolations  Medications Medications  cefTRIAXone (ROCEPHIN) 1 g in sodium chloride 0.9 % 100 mL IVPB (has no administration in time range)  acetaminophen (TYLENOL) tablet 650 mg (has no administration in time range)    Or  acetaminophen (TYLENOL) suppository 650 mg (has no administration in time range)  insulin aspart (novoLOG) injection 0-9 Units ( Subcutaneous Not Given 07/09/23 1139)  insulin aspart (novoLOG) injection 0-5 Units (has no administration in time range)  gabapentin (NEURONTIN) capsule 100 mg (100 mg Oral Given 07/09/23 0956)  escitalopram (LEXAPRO) tablet 10 mg (10 mg Oral Given 07/09/23 0956)  polyethylene glycol (MIRALAX / GLYCOLAX) packet 17 g (17 g Oral Given 07/09/23 0956)  tamsulosin (FLOMAX) capsule 0.4 mg (0.4 mg Oral Given 07/09/23 0956)  brimonidine (ALPHAGAN) 0.2 % ophthalmic solution 1 drop (has no administration in time range)  dorzolamide-timolol (COSOPT) 2-0.5 % ophthalmic solution 1 drop (has no administration in time range)  carboxymethylcellulose (REFRESH PLUS) 0.5 % ophthalmic solution 1 drop (has no administration in time range)  Netarsudil-Latanoprost 0.02-0.005 % SOLN 1 drop (has no administration in time range)  hydrocortisone (ANUSOL-HC) suppository 25 mg (has no administration in time range)  metFORMIN (GLUCOPHAGE) tablet 1,000 mg (1,000 mg Oral Given 07/09/23 0955)  amLODipine (NORVASC) tablet 5 mg (has no  administration in time range)  lactated ringers bolus 1,000 mL (0 mLs Intravenous Stopped 07/09/23 0440)  meclizine (ANTIVERT) tablet 25 mg (25 mg Oral Given 07/09/23 0246)  cefTRIAXone (ROCEPHIN) 1 g in sodium chloride 0.9 % 100 mL IVPB (0 g Intravenous Stopped 07/09/23 0522)  lactated ringers bolus 1,000 mL (0 mLs Intravenous Stopped 07/09/23 0624)  Mobility walks     Focused Assessments Musculoskeletal   R Recommendations: See Admitting Provider Note  Report given to:   Additional Notes:

## 2023-07-09 NOTE — ED Notes (Signed)
[name] (sister, guardian)updated as to patient's status.

## 2023-07-10 DIAGNOSIS — R42 Dizziness and giddiness: Secondary | ICD-10-CM | POA: Diagnosis not present

## 2023-07-10 LAB — CBC WITH DIFFERENTIAL/PLATELET
Abs Immature Granulocytes: 0.04 10*3/uL (ref 0.00–0.07)
Basophils Absolute: 0 10*3/uL (ref 0.0–0.1)
Basophils Relative: 0 %
Eosinophils Absolute: 0.2 10*3/uL (ref 0.0–0.5)
Eosinophils Relative: 3 %
HCT: 32.1 % — ABNORMAL LOW (ref 39.0–52.0)
Hemoglobin: 9.9 g/dL — ABNORMAL LOW (ref 13.0–17.0)
Immature Granulocytes: 1 %
Lymphocytes Relative: 14 %
Lymphs Abs: 1.2 10*3/uL (ref 0.7–4.0)
MCH: 27.2 pg (ref 26.0–34.0)
MCHC: 30.8 g/dL (ref 30.0–36.0)
MCV: 88.2 fL (ref 80.0–100.0)
Monocytes Absolute: 0.7 10*3/uL (ref 0.1–1.0)
Monocytes Relative: 8 %
Neutro Abs: 6.3 10*3/uL (ref 1.7–7.7)
Neutrophils Relative %: 74 %
Platelets: 203 10*3/uL (ref 150–400)
RBC: 3.64 MIL/uL — ABNORMAL LOW (ref 4.22–5.81)
RDW: 15.9 % — ABNORMAL HIGH (ref 11.5–15.5)
WBC: 8.4 10*3/uL (ref 4.0–10.5)
nRBC: 0 % (ref 0.0–0.2)

## 2023-07-10 LAB — GLUCOSE, CAPILLARY
Glucose-Capillary: 79 mg/dL (ref 70–99)
Glucose-Capillary: 144 mg/dL — ABNORMAL HIGH (ref 70–99)
Glucose-Capillary: 95 mg/dL (ref 70–99)
Glucose-Capillary: 118 mg/dL — ABNORMAL HIGH (ref 70–99)

## 2023-07-10 LAB — BASIC METABOLIC PANEL
Anion gap: 9 (ref 5–15)
BUN: 14 mg/dL (ref 8–23)
CO2: 27 mmol/L (ref 22–32)
Calcium: 9.1 mg/dL (ref 8.9–10.3)
Chloride: 100 mmol/L (ref 98–111)
Creatinine, Ser: 0.72 mg/dL (ref 0.61–1.24)
GFR, Estimated: >60 mL/min (ref >60)
Glucose, Bld: 101 mg/dL — ABNORMAL HIGH (ref 70–99)
Potassium: 3.9 mmol/L (ref 3.5–5.1)
Sodium: 136 mmol/L (ref 135–145)

## 2023-07-10 MED ORDER — ROSUVASTATIN CALCIUM 20 MG PO TABS
40.0000 mg | ORAL_TABLET | Freq: Every day | ORAL | Status: DC
  Administered 2023-07-10 – 2023-07-16 (×7): 40 mg via ORAL
  Filled 2023-07-10 (×7): qty 2

## 2023-07-10 MED ORDER — ASPIRIN 81 MG PO CHEW
81.0000 mg | CHEWABLE_TABLET | Freq: Every day | ORAL | Status: DC
  Administered 2023-07-10 – 2023-07-16 (×7): 81 mg via ORAL
  Filled 2023-07-10 (×7): qty 1

## 2023-07-10 MED ORDER — MIDODRINE HCL 5 MG PO TABS
5.0000 mg | ORAL_TABLET | Freq: Three times a day (TID) | ORAL | Status: DC
  Administered 2023-07-10 – 2023-07-11 (×4): 5 mg via ORAL
  Filled 2023-07-10 (×4): qty 1

## 2023-07-10 MED ORDER — EMPAGLIFLOZIN 10 MG PO TABS: 10.0000 mg | ORAL_TABLET | Freq: Every day | ORAL | Status: DC

## 2023-07-10 MED ORDER — SODIUM CHLORIDE 0.9 % IV BOLUS
500.0000 mL | Freq: Once | INTRAVENOUS | Status: AC
  Administered 2023-07-10: 500 mL via INTRAVENOUS

## 2023-07-10 MED ORDER — FINASTERIDE 5 MG PO TABS
5.0000 mg | ORAL_TABLET | Freq: Every day | ORAL | Status: DC
  Administered 2023-07-10 – 2023-07-16 (×7): 5 mg via ORAL
  Filled 2023-07-10 (×7): qty 1

## 2023-07-10 MED ORDER — SODIUM CHLORIDE 0.9 % IV SOLN: INTRAVENOUS | Status: DC

## 2023-07-10 NOTE — Progress Notes (Signed)
[name]  DOB: Nov 15, 1955  PCP: System, Provider Not In [id number]  DOA: 07/08/2023  LOS: 1 day  Hospital Day: 3  Brief narrative: Xavier Turner is a 67 y.o. 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 10/14, patient was sent to the ED after an episode of dizziness, near syncope leading to fall and impact on head.  Following the fall, patient complained of right-sided facial pain, neck pain and mid back pain.  Of note, patient had recent readjustments in his blood pressure medications due to hypotension.  In the ED, patient was hypotensive to 80s and 90s.  He was unable to sit up due to recurrence of dizziness and could not tolerate checking orthostatic vital signs.   No fever, breathing on room air Initial labs with WBC count 7, lactic acid level normal, hemoglobin 9.3,  BMP unremarkable FOBT negative UA with large amount of leukocytes and microscopy showing >50 WBCs and few bacteria.   Urine culture was sent. Blood cultures collected.   Chest x-ray showed no active disease.   Pelvic x-ray negative for any traumatic injuries.   CT head negative for any acute intracranial abnormality.   CT C-spine and maxillofacial negative for acute findings.   EKG showed sinus rhythm and no acute ischemic changes.  Patient was started on IV ceftriaxone, meclizine, and 2 L LR.   Admitted to [facility]   Subjective: Patient was seen and examined this morning.  Pleasant elderly Caucasian male.  Sitting up at the edge of the bed.  Not in distress. Felt dizzy earlier when tried to get up Chart reviewed In the last 24 hours, blood pressure mostly 90s,  Assessment and plan: Orthostatic hypotension with  syncope  Per report, patient was undergoing readjustment in his blood pressure medications at the facility due to recurrent hypotension. Brought in after an episode of dizziness leading to fall while trying to stand up PTA meds- HCTZ, olmesartan, Lasix Currently all BP meds on hold. Remains hypotensive at rest.  Gets dizzy on any attempt to stand up. Continue IV fluid Last echo from January 2024 with normal EF Fall precautions  Pyuria and bacteria Patient is not endorsing urinary symptoms but UA with evidence of pyuria and bacteriuria.   No fever, tachycardia, leukocytosis, or lactic acidosis to suggest sepsis.  Urine culture sent.  Currently empirically on IV Rocephin.   Chronic anemia Hemoglobin currently 10.2, was 11.4 on labs 3 months ago.   FOBT negative.  Continue to monitor labs. Recent Labs    12/21/22 1811 04/04/23 1041 07/08/23 2049 07/09/23 0525 07/10/23 0335  HGB 13.9 11.4* 10.2* 9.3* 9.9*  MCV 80.0 87.7 90.3 87.9 88.2    Type 2 diabetes mellitus A1c 6.4 on 07/09/2023 PTA meds-Jardiance 10 mg daily, metformin 1000 mg twice daily, Januvia 100 mg daily Keep metformin and Januvia on hold.  Continue Jardiance Continue SSI/Accu-Cheks Recent Labs  Lab 07/09/23 1138 07/09/23 1655 07/09/23 2059 07/10/23 0623 07/10/23 1109  GLUCAP 92 92 163* 79 144*    Asthma Stable, no signs of acute exacerbation.  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  Given recurrent static hypotension, I would switch from Flomax to finasteride.  Depression Lexapro, Neurontin   Mobility: PT eval ordered.  Monitor orthostatic drop  Goals of care   Code Status: Limited: Do not attempt resuscitation (DNR) -DNR-LIMITED -Do Not Intubate/DNI      DVT prophylaxis:  SCDs  Start: 07/09/23 0446   Antimicrobials: None Fluid: NS at 75 mL/h Consultants: None Family Communication: None at bedside  Status: Inpatient Level of care:  Telemetry Cardiac   Patient is from: Long-term nursing home Needs to continue in-hospital care: Continues to remain orthostatic.  Needs IV hydration and further blood pressure medicines adjustment Anticipated d/c to: Back to long-term care in 1 to 2 days    Diet:  Diet Order             Diet Carb Modified Fluid consistency: Thin; Room service appropriate? Yes  Diet effective now                   Scheduled Meds:  aspirin  81 mg Oral Daily   brimonidine  1 drop Both Eyes TID   dorzolamide-timolol  1 drop Right Eye BID   escitalopram  10 mg Oral Daily   finasteride  5 mg Oral Daily   gabapentin  100 mg Oral BID   insulin aspart  0-5 Units Subcutaneous QHS   insulin aspart  0-9 Units Subcutaneous TID WC   polyethylene glycol  17 g Oral Daily   rosuvastatin  40 mg Oral QHS    PRN meds: acetaminophen **OR** acetaminophen, hydrocortisone, polyvinyl alcohol   Infusions:   sodium chloride 75 mL/hr at 07/10/23 1146   cefTRIAXone (ROCEPHIN)  IV 1 g (07/10/23 0355)    Antimicrobials: Anti-infectives (From admission, onward)    Start     Dose/Rate Route Frequency Ordered Stop   07/10/23 0400  cefTRIAXone (ROCEPHIN) 1 g in sodium chloride 0.9 % 100 mL IVPB        1 g 200 mL/hr over 30 Minutes Intravenous Every 24 hours 07/09/23 0446     07/09/23 0315  cefTRIAXone (ROCEPHIN) 1 g in sodium chloride 0.9 % 100 mL IVPB        1 g 200 mL/hr over 30 Minutes Intravenous  Once 07/09/23 0311 07/09/23 0522       Objective: Vitals:   07/10/23 0755 07/10/23 1112  BP: (!) 98/45 98/60  Pulse: 69 72  Resp: 19 19  Temp: 98.4 F (36.9 C) 98.5 F (36.9 C)  SpO2: 96% 97%    Intake/Output Summary (Last 24 hours) at 07/10/2023 1537 Last data filed at 07/10/2023 1100 Gross per 24 hour  Intake 168 ml  Output 1400 ml   Net -1232 ml   Filed Weights   07/09/23 0752  Weight: 95.3 kg   Weight change:  Body mass index is 34.95 kg/m.   Physical Exam: General exam: Pleasant, elderly Caucasian male. Skin: No rashes, lesions or ulcers. HEENT: Atraumatic, normocephalic, no obvious bleeding.  Large hemangioma in the right side of the face Lungs: Clear to auscultation bilaterally CVS: Regular rate and rhythm, no murmur GI/Abd soft, nontender, nondistended, bowel sound present CNS: Alert, awake, oriented x 3 Psychiatry: Mood appropriate Extremities: No pedal edema, no calf tenderness  Data Review: I have personally reviewed the laboratory data and studies available.  F/u labs ordered Unresulted Labs (From admission, onward)     Start     Ordered   07/10/23 0500  CBC  with Differential/Platelet  Daily,   R      07/09/23 0853   07/10/23 0500  Basic metabolic panel  Daily,   R      07/09/23 0853            Admission date and time: 07/08/2023  8:28 PM   Total time spent in review of labs and imaging, patient evaluation, formulation of plan, documentation and communication with family: 55 minutes  Signed, Lorin Glass, MD [facility] 07/10/2023

## 2023-07-10 NOTE — Consult Note (Signed)
WOC Nurse Consult Note: Reason for Consult: Consult requested for left foot. Pt had previous toe amputation and has a dry yellow scabbed callous over the left outer foot previous surgical scar location. No odor, drainage, or fluctuance.  No need for topical care.  Orders provided for bedside nurses to perform as follows to protect from further injury: Apply Foam dressing to left outer foot and change Q 3 days or PRN soiling. Please re-consult if further assistance is needed.  Thank-you,  Cammie Mcgee MSN, RN, CWOCN, [place], CNS [phone]

## 2023-07-10 NOTE — Progress Notes (Signed)
   07/09/23 2229  BiPAP/CPAP/SIPAP  $ Non-Invasive Ventilator  Non-Invasive Vent Set Up  Reason BIPAP/CPAP not in use  (pt said he is not ready for cpap at this time.)

## 2023-07-10 NOTE — Evaluation (Signed)
Physical Therapy Evaluation Patient Details Name: Xavier Turner MRN: [id number] DOB: 03-Apr-1956 Today's Date: 07/10/2023  History of Present Illness  67 y.o. male presents to [facility] hospital on 07/08/2023 after a fall preceded by near syncopal episode. Pt with hypotension in the ED, concern for possible UTI. PMH includes anemia, asthma, CKD, DMII, glaucoma, HTN, HLD, OSA, seizure disorder, Sturge-Weber syndrome, BPH, depression.  Clinical Impression  Pt presents to PT with deficits in functional mobility, gait, balance, strength, power, endurance. Pt currently requires physical assistance to perform bed mobility and transfers, unable to ambulate at this time due to a persistent posterior lean and LE weakness. Pt reports a significant fear of falling during this session, understandable after recent fall prior to admission. Pt will benefit from continued frequent mobilization in an effort to improve stability and reduce falls risk. PT recommends a return to [facility] with PT services when medically appropriate to discharge.        If plan is discharge home, recommend the following: A lot of help with walking and/or transfers;A lot of help with bathing/dressing/bathroom;Assistance with cooking/housework;Direct supervision/assist for medications management;Direct supervision/assist for financial management;Assist for transportation;Help with stairs or ramp for entrance   Can travel by private vehicle   No    Equipment Recommendations None recommended by PT  Recommendations for Other Services       Functional Status Assessment Patient has had a recent decline in their functional status and demonstrates the ability to make significant improvements in function in a reasonable and predictable amount of time.     Precautions / Restrictions Precautions Precautions: Fall Precaution Comments: L hemiplegia Restrictions Weight Bearing Restrictions: No      Mobility  Bed Mobility Overal bed  mobility: Needs Assistance Bed Mobility: Supine to Sit, Sit to Supine     Supine to sit: Mod assist, HOB elevated Sit to supine: Mod assist        Transfers Overall transfer level: Needs assistance Equipment used: 2 person hand held assist Transfers: Sit to/from Stand Sit to Stand: Min assist, +2 physical assistance                Ambulation/Gait             Pre-gait activities: shuffling to left side at edge of bed, neither foot clears floor to step    Stairs            Wheelchair Mobility     Tilt Bed    Modified Rankin (Stroke Patients Only)       Balance Overall balance assessment: Needs assistance Sitting-balance support: Single extremity supported Sitting balance-Leahy Scale: Poor     Standing balance support: Bilateral upper extremity supported Standing balance-Leahy Scale: Poor Standing balance comment: minA x 2, posterior lean                             Pertinent Vitals/Pain Pain Assessment Pain Assessment: No/denies pain    Home Living Family/patient expects to be discharged to:: Skilled nursing facility                   Additional Comments: Notes indicate pt resides at [facility]    Prior Function Prior Level of Function : Needs assist             Mobility Comments: pt reports ambulating some with a SPC, ambulates behind MWC when needing to carry items in chair, otherwise utilizes [facility] for mobility ADLs  Comments: assistance for ADL and IADLs     Extremity/Trunk Assessment   Upper Extremity Assessment Upper Extremity Assessment: LUE deficits/detail LUE Deficits / Details: pt with chronic LUE weakness, decorticate positioning of LUE, digit contractures noted    Lower Extremity Assessment Lower Extremity Assessment: LLE deficits/detail LLE Deficits / Details: generalized weakness likely chronic, grossly 4-/5    Cervical / Trunk Assessment Cervical / Trunk Assessment: Kyphotic  Communication    Communication Communication: Difficulty communicating thoughts/reduced clarity of speech Cueing Techniques: Verbal cues  Cognition Arousal: Alert Behavior During Therapy: Anxious (fear of falling) Overall Cognitive Status: History of cognitive impairments - at baseline                                 General Comments: pt with baseline cognitive deficits. Oriented to person, place. Pt has fair awareness of deficits at this time.        General Comments General comments (skin integrity, edema, etc.): VSS on RA, orthostatics negative. Pt reports dizziness after performing pre-gait shuffling once sitting, denies with earlier stand attempts. BP was stable after report of dizziness    Exercises     Assessment/Plan    PT Assessment Patient needs continued PT services  PT Problem List Decreased strength;Decreased activity tolerance;Decreased balance;Decreased mobility;Decreased coordination       PT Treatment Interventions Gait training;DME instruction;Functional mobility training;Therapeutic activities;Therapeutic exercise;Balance training;Neuromuscular re-education;Cognitive remediation;Patient/family education;Wheelchair mobility training    PT Goals (Current goals can be found in the Care Plan section)  Acute Rehab PT Goals Patient Stated Goal: to return to ambulation PT Goal Formulation: With patient Time For Goal Achievement: 07/24/23 Potential to Achieve Goals: Fair    Frequency Min 1X/week     Co-evaluation               AM-PAC PT "6 Clicks" Mobility  Outcome Measure Help needed turning from your back to your side while in a flat bed without using bedrails?: A Little Help needed moving from lying on your back to sitting on the side of a flat bed without using bedrails?: A Lot Help needed moving to and from a bed to a chair (including a wheelchair)?: A Lot Help needed standing up from a chair using your arms (e.g., wheelchair or bedside chair)?: A  Lot Help needed to walk in hospital room?: Total Help needed climbing 3-5 steps with a railing? : Total 6 Click Score: 11    End of Session   Activity Tolerance: Patient tolerated treatment well Patient left: in bed;with call bell/phone within reach;with bed alarm set Nurse Communication: Mobility status PT Visit Diagnosis: Other abnormalities of gait and mobility (R26.89);Muscle weakness (generalized) (M62.81);History of falling (Z91.81)    Time: 1610-9604 PT Time Calculation (min) (ACUTE ONLY): 28 min   Charges:   PT Evaluation $PT Eval Low Complexity: 1 Low   PT General Charges $$ ACUTE PT VISIT: 1 Visit         Arlyss Gandy, PT, DPT Acute Rehabilitation Office [phone]   Arlyss Gandy 07/10/2023, 5:30 PM

## 2023-07-10 NOTE — Plan of Care (Signed)

## 2023-07-11 DIAGNOSIS — R42 Dizziness and giddiness: Secondary | ICD-10-CM | POA: Diagnosis not present

## 2023-07-11 LAB — GLUCOSE, CAPILLARY
Glucose-Capillary: 109 mg/dL — ABNORMAL HIGH (ref 70–99)
Glucose-Capillary: 93 mg/dL (ref 70–99)
Glucose-Capillary: 115 mg/dL — ABNORMAL HIGH (ref 70–99)
Glucose-Capillary: 96 mg/dL (ref 70–99)
Glucose-Capillary: 111 mg/dL — ABNORMAL HIGH (ref 70–99)

## 2023-07-11 LAB — CBC WITH DIFFERENTIAL/PLATELET
Abs Immature Granulocytes: 0.01 10*3/uL (ref 0.00–0.07)
Basophils Absolute: 0 10*3/uL (ref 0.0–0.1)
Basophils Relative: 1 %
Eosinophils Absolute: 0.3 10*3/uL (ref 0.0–0.5)
Eosinophils Relative: 4 %
HCT: 29.6 % — ABNORMAL LOW (ref 39.0–52.0)
Hemoglobin: 9.3 g/dL — ABNORMAL LOW (ref 13.0–17.0)
Immature Granulocytes: 0 %
Lymphocytes Relative: 22 %
Lymphs Abs: 1.4 10*3/uL (ref 0.7–4.0)
MCH: 27.4 pg (ref 26.0–34.0)
MCHC: 31.4 g/dL (ref 30.0–36.0)
MCV: 87.3 fL (ref 80.0–100.0)
Monocytes Absolute: 0.5 10*3/uL (ref 0.1–1.0)
Monocytes Relative: 8 %
Neutro Abs: 4.1 10*3/uL (ref 1.7–7.7)
Neutrophils Relative %: 65 %
Platelets: 178 10*3/uL (ref 150–400)
RBC: 3.39 MIL/uL — ABNORMAL LOW (ref 4.22–5.81)
RDW: 15.9 % — ABNORMAL HIGH (ref 11.5–15.5)
WBC: 6.4 10*3/uL (ref 4.0–10.5)
nRBC: 0 % (ref 0.0–0.2)

## 2023-07-11 LAB — BASIC METABOLIC PANEL
Anion gap: 8 (ref 5–15)
BUN: 11 mg/dL (ref 8–23)
CO2: 24 mmol/L (ref 22–32)
Calcium: 8.2 mg/dL — ABNORMAL LOW (ref 8.9–10.3)
Chloride: 105 mmol/L (ref 98–111)
Creatinine, Ser: 0.64 mg/dL (ref 0.61–1.24)
GFR, Estimated: >60 mL/min (ref >60)
Glucose, Bld: 104 mg/dL — ABNORMAL HIGH (ref 70–99)
Potassium: 3.5 mmol/L (ref 3.5–5.1)
Sodium: 137 mmol/L (ref 135–145)

## 2023-07-11 MED ORDER — MIDODRINE HCL 5 MG PO TABS
2.5000 mg | ORAL_TABLET | Freq: Three times a day (TID) | ORAL | Status: DC
  Administered 2023-07-12: 2.5 mg via ORAL
  Filled 2023-07-11: qty 1

## 2023-07-11 MED ORDER — SODIUM CHLORIDE 0.9 % IV BOLUS
500.0000 mL | Freq: Once | INTRAVENOUS | Status: AC
  Administered 2023-07-11: 500 mL via INTRAVENOUS

## 2023-07-11 NOTE — TOC Initial Note (Signed)
Transition of Care [facility]) - Initial/Assessment Note    Patient Details  Name: Xavier Turner MRN: [id number] Date of Birth: [birth year]  Transition of Care [facility]) CM/SW Contact:    Deatra Robinson, [place] Phone Number: 07/11/2023, 10:20 AM  Clinical Narrative: spoke to pt's sister [name] who confirmed pt is a LTC resident at [facility] and the plan is for return at dc. Message left for Herbert Seta in [facility] admissions requesting return call. SW will follow.   Dellie Burns, MSW, LCSW [phone] (coverage)           Expected Discharge Plan: Skilled Nursing Facility Barriers to Discharge: Continued Medical Work up   Patient Goals and CMS Choice            Expected Discharge Plan and Services     Post Acute Care Choice: Skilled Nursing Facility Living arrangements for the past 2 months: Skilled Nursing Facility                                      Prior Living Arrangements/Services Living arrangements for the past 2 months: Skilled Nursing Facility Lives with:: Facility Resident          Need for Family Participation in Patient Care: Yes (Comment) Care giver support system in place?: Yes (comment)   Criminal Activity/Legal Involvement Pertinent to Current Situation/Hospitalization: No - Comment as needed  Activities of Daily Living      Permission Sought/Granted Permission sought to share information with : Oceanographer granted to share information with : Yes, Verbal Permission Granted              Emotional Assessment       Orientation: : Fluctuating Orientation (Suspected and/or reported Sundowners) Alcohol / Substance Use: Not Applicable Psych Involvement: No (comment)  Admission diagnosis:  UTI (urinary tract infection) [N39.0] Near syncope [R55] Cystitis [N30.90] Fall, initial encounter [W19.XXXA] Patient Active Problem List   Diagnosis Date Noted   UTI (urinary tract infection) 07/09/2023    Dizziness 07/09/2023   Anemia 07/09/2023   Iron deficiency anemia 12/15/2021   Cellulitis of left foot 12/14/2021   Edema 07/19/2021   Pre-ulcerative calluses 01/17/2021   Pain due to onychomycosis of toenails of both feet 10/21/2019   Amputation of little toe (HCC) 09/11/2019   Poor mobility 09/11/2019   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 (HCC) 07/31/2019   Acute osteomyelitis of metatarsal bone of left foot (HCC) 07/28/2019   Hyperlipidemia associated with type 2 diabetes mellitus (HCC) 07/28/2019   Hypertension associated with diabetes (HCC) 07/28/2019   Depression 07/28/2019   Subacute osteomyelitis, left ankle and foot (HCC)    Recurrent cellulitis of lower extremity 07/21/2019   Recurrent cellulitis 07/21/2019   Lactic acidosis 07/21/2019   Hyponatremia 07/08/2019   GERD (gastroesophageal reflux disease) 07/08/2019   Type 2 diabetes mellitus (HCC) 07/08/2019   BPH (benign prostatic hyperplasia) 07/08/2019   Cellulitis 07/06/2019   Droopy eyelid, right 11/26/2017   Laryngitis 09/27/2017   Adjustment disorder with depressed mood 02/29/2016   Bilateral edema of lower extremity 09/14/2015   High risk medications (not anticoagulants) long-term use 09/14/2015   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 [place] [address] Phone: [phone] Fax: [phone]  [facility] - [place],  [place] - [address] [place] [address] Phone: [phone] Fax: [phone]     Social Determinants of Health (SDOH) Social History: SDOH Screenings   Food Insecurity: No Food Insecurity (10/25/2021)   Received from [facility]  Transportation Needs: No Transportation Needs (10/25/2021)   Received from [facility], [facility]  Financial Resource Strain: Low Risk  (10/25/2021)   Received from [facility], [facility]  Physical Activity: Unknown (10/25/2021)   Received from [facility], [facility]  Recent Concern: Physical Activity - Inactive (10/25/2021)   Received from [facility]  Social Connections: Unknown (01/21/2022)   Received from [facility], [facility]  Recent Concern: Social Connections - Socially Isolated (10/25/2021)   Received from [facility], [facility]  Stress: Stress Concern Present (10/25/2021)   Received from [facility], [facility]  Tobacco Use: Low Risk  (07/08/2023)   SDOH Interventions:     Readmission Risk Interventions     No data to display

## 2023-07-11 NOTE — Progress Notes (Addendum)
PROGRESS NOTE  Xavier Turner  DOB: 16-Dec-1955  PCP: System, Provider Not In [id number]  DOA: 07/08/2023  LOS: 2 days  Hospital Day: 4  Brief narrative: Xavier Turner is a 67 y.o. 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 10/14, patient was sent to the ED after an episode of dizziness, near syncope leading to fall and impact on head.  Following the fall, patient complained of right-sided facial pain, neck pain and mid back pain.  Of note, patient had recent readjustments in his blood pressure medications due to hypotension.  In the ED, patient was hypotensive to 80s and 90s.  He was unable to sit up due to recurrence of dizziness and could not tolerate checking orthostatic vital signs.   No fever, breathing on room air Initial labs with WBC count 7, lactic acid level normal, hemoglobin 9.3,  BMP unremarkable FOBT negative UA with large amount of leukocytes and microscopy showing >50 WBCs and few bacteria.   Urine culture was sent. Blood cultures collected.   Chest x-ray showed no active disease.   Pelvic x-ray negative for any traumatic injuries.   CT head negative for any acute intracranial abnormality.   CT C-spine and maxillofacial negative for acute findings.   EKG showed sinus rhythm and no acute ischemic changes.  Patient was started on IV ceftriaxone, meclizine, and 2 L LR.   Admitted to [facility]   Subjective: Patient was seen and examined this morning.  Pleasant elderly Caucasian male.  Lying on bed.  Not in distress.   Dizziness continues on standing up. Remains in IV fluid.  Midodrine was started last night. I called and updated his sister Ms. [name] from bedside today.  Assessment and  plan: Orthostatic hypotension with syncope  Per report, patient was undergoing readjustment in his blood pressure medications at the facility due to recurrent hypotension. Brought in from SNF after an episode of dizziness leading to fall while trying to stand up Last echo from June 2024 with EF 60 to 65%, no regional WMA, normal RV systolic function and size PTA meds- HCTZ, olmesartan, Lasix Currently all BP meds on hold. Continue IV fluid Also started on oral midodrine last night. Last echo from January 2024 with normal EF Fall precautions Continue to monitor orthostatic vital signs  Pyuria and bacteria Patient is not endorsing urinary symptoms but UA with evidence of pyuria and bacteriuria.   No fever, tachycardia, leukocytosis, or lactic acidosis to suggest sepsis.  Urine culture sent.  Currently empirically on IV Rocephin for 3 days   Chronic anemia Hemoglobin currently 10.2, was 11.4 on labs 3 months ago.   FOBT negative.  Continue to monitor labs. Recent Labs    04/04/23 1041 07/08/23 2049 07/09/23 0525 07/10/23 0335 07/11/23 0258  HGB 11.4* 10.2* 9.3* 9.9* 9.3*  MCV 87.7 90.3 87.9 88.2 87.3    Type 2 diabetes mellitus A1c 6.4 on 07/09/2023 PTA meds-Jardiance 10 mg daily, metformin 1000 mg twice daily, Januvia 100 mg daily Currently metformin and Januvia are on hold.  Continue Jardiance Continue SSI/Accu-Cheks Recent Labs  Lab 07/10/23 1603 07/10/23 2127 07/11/23 0624 07/11/23 0746 07/11/23 1319  GLUCAP 95 118* 109* 93 115*    Asthma Stable, no signs of acute exacerbation.  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  Given recurrent orthostatic hypotension, Flomax was switched to finasteride.    Depression Lexapro, Neurontin   Mobility: PT  eval ordered.  Monitor orthostatic drop  Goals of care   Code Status: Limited: Do not attempt resuscitation (DNR) -DNR-LIMITED -Do Not Intubate/DNI      DVT prophylaxis:  SCDs Start: 07/09/23 0446   Antimicrobials: None Fluid: NS at 75 mL/h to continue Consultants: None Family Communication: None at bedside  Status: Inpatient Level of care:  Telemetry Cardiac   Patient is from: Long-term nursing home Needs to continue in-hospital care: Continues to remain orthostatic.  Needs IV hydration and further blood pressure medicines adjustment Anticipated d/c to: Back to long-term care in 1 to 2 days    Diet:  Diet Order             Diet Carb Modified Fluid consistency: Thin; Room service appropriate? Yes  Diet effective now                   Scheduled Meds:  aspirin  81 mg Oral Daily   brimonidine  1 drop Both Eyes TID   dorzolamide-timolol  1 drop Right Eye BID   escitalopram  10 mg Oral Daily   finasteride  5 mg Oral Daily   gabapentin  100 mg Oral BID   insulin aspart  0-5 Units Subcutaneous QHS   insulin aspart  0-9 Units Subcutaneous TID WC   midodrine  5 mg Oral TID WC   polyethylene glycol  17 g Oral Daily   rosuvastatin  40 mg Oral QHS    PRN meds: acetaminophen **OR** acetaminophen, hydrocortisone, polyvinyl alcohol   Infusions:   sodium chloride 75 mL/hr at 07/11/23 0219   cefTRIAXone (ROCEPHIN)  IV 1 g (07/11/23 0522)    Antimicrobials: Anti-infectives (From admission, onward)    Start     Dose/Rate Route Frequency Ordered Stop   07/10/23 0400  cefTRIAXone (ROCEPHIN) 1 g in sodium chloride 0.9 % 100 mL IVPB        1 g 200 mL/hr over 30 Minutes Intravenous Every 24 hours 07/09/23 0446     07/09/23 0315  cefTRIAXone (ROCEPHIN) 1 g in sodium chloride 0.9 % 100 mL IVPB        1 g 200 mL/hr over 30 Minutes Intravenous  Once 07/09/23 0311 07/09/23 0522       Objective: Vitals:   07/11/23 0734 07/11/23 1335  BP: (!) 99/52 (!) 97/53  Pulse: (!)  58 61  Resp:    Temp: 98.3 F (36.8 C) 98.4 F (36.9 C)  SpO2: 97% 97%    Intake/Output Summary (Last 24 hours) at 07/11/2023 1441 Last data filed at 07/11/2023 1408 Gross per 24 hour  Intake 1091.1 ml  Output 1750 ml  Net -658.9 ml   Filed Weights   07/09/23 0752  Weight: 95.3 kg   Weight change:  Body mass index is 34.95 kg/m.   Physical Exam: General exam: Pleasant, elderly Caucasian male. Skin: No rashes, lesions or ulcers. HEENT: Atraumatic, normocephalic, no obvious bleeding.  Large hemangioma in the right side of the face Lungs: Clear to auscultation bilaterally CVS: Regular rate and rhythm, no murmur GI/Abd soft, nontender, nondistended, bowel sound present CNS: Alert, awake, oriented x 3 Psychiatry: Mood appropriate Extremities: No pedal  edema, no calf tenderness  Data Review: I have personally reviewed the laboratory data and studies available.  F/u labs ordered Unresulted Labs (From admission, onward)     Start     Ordered   07/10/23 0500  CBC with Differential/Platelet  Daily,   R      07/09/23 0853   07/10/23 0500  Basic metabolic panel  Daily,   R      07/09/23 0853           Total time spent in review of labs and imaging, patient evaluation, formulation of plan, documentation and communication with family: 45 minutes  Signed, Lorin Glass, MD [facility] 07/11/2023

## 2023-07-11 NOTE — Plan of Care (Signed)
  Problem: Education: Goal: Ability to describe self-care measures that may prevent or decrease complications (Diabetes Survival Skills Education) will improve Outcome: Progressing Goal: Individualized Educational Video(s) Outcome: Progressing   Problem: Coping: Goal: Ability to adjust to condition or change in health will improve Outcome: Progressing   Problem: Fluid Volume: Goal: Ability to maintain a balanced intake and output will improve Outcome: Progressing   Problem: Health Behavior/Discharge Planning: Goal: Ability to identify and utilize available resources and services will improve Outcome: Progressing Goal: Ability to manage health-related needs will improve Outcome: Progressing   Problem: Nutritional: Goal: Maintenance of adequate nutrition will improve Outcome: Progressing

## 2023-07-11 NOTE — Progress Notes (Signed)
Mobility Specialist Progress Note:    07/11/23 1552  Orthostatic Lying   BP- Lying 96/59  Orthostatic Sitting  BP- Sitting 113/71  Orthostatic Standing at 0 minutes  BP- Standing at 0 minutes 104/66  Orthostatic Standing at 3 minutes  BP- Standing at 3 minutes 108/59  Mobility  Activity Stood at bedside  Level of Assistance Moderate assist, patient does 50-74%  Assistive Device Front wheel walker  Activity Response Tolerated well  Mobility Referral Yes  $Mobility charge 1 Mobility  Mobility Specialist Start Time (ACUTE ONLY) 1520  Mobility Specialist Stop Time (ACUTE ONLY) 1535  Mobility Specialist Time Calculation (min) (ACUTE ONLY) 15 min    Received pt in bed agreeable perform orthostatic vitals. Pt needed ModA for bed mobility and STS. No c/o throughout session and no dizziness. Needed x3 attempts to stand. Was able to stand for 3 mins at bedside. Returned to supine w/ call bell and personal belongings in reach. All needs met.   Thompson Grayer Mobility Specialist  Please contact vis Secure Chat or  Rehab Office [phone]

## 2023-07-11 NOTE — Progress Notes (Signed)
Patient noted to be hypotensive on midnight vitals. Confirmed manually. Informed on-call physician; order received for NS bolus and midodrine. Both provided as ordered. On recheck, hypotension had worsened. Notified on-call physician; order received for a second bolus. Hypotension resolved.  See flowsheet for associated vital signs.

## 2023-07-12 DIAGNOSIS — R42 Dizziness and giddiness: Secondary | ICD-10-CM | POA: Diagnosis not present

## 2023-07-12 LAB — BASIC METABOLIC PANEL
Anion gap: 9 (ref 5–15)
BUN: 11 mg/dL (ref 8–23)
CO2: 23 mmol/L (ref 22–32)
Calcium: 8.8 mg/dL — ABNORMAL LOW (ref 8.9–10.3)
Chloride: 106 mmol/L (ref 98–111)
Creatinine, Ser: 0.59 mg/dL — ABNORMAL LOW (ref 0.61–1.24)
GFR, Estimated: >60 mL/min (ref >60)
Glucose, Bld: 105 mg/dL — ABNORMAL HIGH (ref 70–99)
Potassium: 3.4 mmol/L — ABNORMAL LOW (ref 3.5–5.1)
Sodium: 138 mmol/L (ref 135–145)

## 2023-07-12 LAB — CBC WITH DIFFERENTIAL/PLATELET
Abs Immature Granulocytes: 0.02 10*3/uL (ref 0.00–0.07)
Basophils Absolute: 0 10*3/uL (ref 0.0–0.1)
Basophils Relative: 1 %
Eosinophils Absolute: 0.3 10*3/uL (ref 0.0–0.5)
Eosinophils Relative: 4 %
HCT: 31 % — ABNORMAL LOW (ref 39.0–52.0)
Hemoglobin: 9.6 g/dL — ABNORMAL LOW (ref 13.0–17.0)
Immature Granulocytes: 0 %
Lymphocytes Relative: 23 %
Lymphs Abs: 1.4 10*3/uL (ref 0.7–4.0)
MCH: 26.9 pg (ref 26.0–34.0)
MCHC: 31 g/dL (ref 30.0–36.0)
MCV: 86.8 fL (ref 80.0–100.0)
Monocytes Absolute: 0.4 10*3/uL (ref 0.1–1.0)
Monocytes Relative: 8 %
Neutro Abs: 3.8 10*3/uL (ref 1.7–7.7)
Neutrophils Relative %: 64 %
Platelets: 173 10*3/uL (ref 150–400)
RBC: 3.57 MIL/uL — ABNORMAL LOW (ref 4.22–5.81)
RDW: 15.9 % — ABNORMAL HIGH (ref 11.5–15.5)
WBC: 5.9 10*3/uL (ref 4.0–10.5)
nRBC: 0 % (ref 0.0–0.2)

## 2023-07-12 LAB — GLUCOSE, CAPILLARY
Glucose-Capillary: 104 mg/dL — ABNORMAL HIGH (ref 70–99)
Glucose-Capillary: 98 mg/dL (ref 70–99)
Glucose-Capillary: 121 mg/dL — ABNORMAL HIGH (ref 70–99)
Glucose-Capillary: 138 mg/dL — ABNORMAL HIGH (ref 70–99)

## 2023-07-12 MED ORDER — MIDODRINE HCL 5 MG PO TABS
5.0000 mg | ORAL_TABLET | Freq: Three times a day (TID) | ORAL | Status: DC
  Administered 2023-07-12 – 2023-07-16 (×14): 5 mg via ORAL
  Filled 2023-07-12 (×14): qty 1

## 2023-07-12 MED ORDER — POTASSIUM CHLORIDE CRYS ER 20 MEQ PO TBCR
40.0000 meq | EXTENDED_RELEASE_TABLET | Freq: Once | ORAL | Status: AC
  Administered 2023-07-12: 40 meq via ORAL
  Filled 2023-07-12: qty 2

## 2023-07-12 MED ORDER — MIDODRINE HCL 5 MG PO TABS: 5.0000 mg | ORAL_TABLET | Freq: Two times a day (BID) | ORAL | Status: DC

## 2023-07-12 NOTE — Progress Notes (Signed)
PROGRESS NOTE  Xavier Turner  DOB: [birth year]  PCP: System, Provider Not In ZOX:[id number]  DOA: 07/08/2023  LOS: 3 days  Hospital Day: 5  Brief narrative: Xavier Turner is a 67 y.o. 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 10/14, patient was sent to the ED after an episode of dizziness, near syncope leading to fall and impact on head.  Following the fall, patient complained of right-sided facial pain, neck pain and mid back pain.  Of note, patient had recent readjustments in his blood pressure medications due to hypotension.  In the ED, patient was hypotensive to 80s and 90s.  He was unable to sit up due to recurrence of dizziness and could not tolerate checking orthostatic vital signs.   No fever, breathing on room air Initial labs with WBC count 7, lactic acid level normal, hemoglobin 9.3,  BMP unremarkable FOBT negative UA with large amount of leukocytes and microscopy showing >50 WBCs and few bacteria.   Urine culture was sent. Blood cultures collected.   Chest x-ray showed no active disease.   Pelvic x-ray negative for any traumatic injuries.   CT head negative for any acute intracranial abnormality.   CT C-spine and maxillofacial negative for acute findings.   EKG showed sinus rhythm and no acute ischemic changes.  Patient was started on IV ceftriaxone, meclizine, and 2 L LR.   Admitted to [facility]   Subjective: Patient was seen and examined this morning.  Lying on bed.  Feels better. Later this morning, he was seen by physical therapy.  Per PT note, patient was unable to stand with moderate assistance.  He was unsteady in standing, reported feeling dizzy and requested to return to bed.  Blood pressure  remains low in 90s.  Assessment and plan: Orthostatic hypotension with syncope  Per report, patient was undergoing readjustment in his blood pressure medications at the facility due to recurrent hypotension. Brought in from SNF after an episode of dizziness leading to fall while trying to stand up Last echo from June 2024 with EF 60 to 65%, no regional WMA, normal RV systolic function and size PTA meds- HCTZ, olmesartan, Lasix Currently all BP meds on hold. Adequately hydrated.  I will stop IV hydration at this time. He is also on oral midodrine. Today, I increased the dose and frequency to 5 mg 3 times daily Last echo from January 2024 with normal EF Fall precautions Continue to monitor orthostatic vital signs  Pyuria and bacteria Patient is not endorsing urinary symptoms but UA with evidence of pyuria and bacteriuria.   No fever, tachycardia, leukocytosis, or lactic acidosis to suggest sepsis.  Urine culture sent.  Completed 3 days of empiric IV Rocephin.   Chronic anemia Hemoglobin currently 10.2, was 11.4 on labs 3 months ago.   FOBT negative.  Continue to monitor labs. Recent Labs    07/08/23 2049 07/09/23 0525 07/10/23 0335 07/11/23 0258 07/12/23 0501  HGB 10.2* 9.3* 9.9* 9.3* 9.6*  MCV 90.3 87.9 88.2 87.3 86.8    Type 2 diabetes mellitus A1c 6.4 on 07/09/2023 PTA meds-Jardiance 10 mg daily, metformin 1000 mg twice daily, Januvia 100 mg daily Currently metformin and Januvia are on hold.  Continue Jardiance Continue SSI/Accu-Cheks Recent Labs  Lab 07/11/23 1319 07/11/23 1622  07/11/23 2157 07/12/23 0618 07/12/23 1122  GLUCAP 115* 96 111* 104* 98    Asthma Stable, no signs of acute exacerbation.  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  Given  recurrent orthostatic hypotension, Flomax was switched to finasteride.    Depression Lexapro, Neurontin   Mobility: PT eval ordered.  Monitor orthostatic drop  Goals of care   Code Status: Limited: Do not attempt resuscitation (DNR) -DNR-LIMITED -Do Not Intubate/DNI      DVT prophylaxis:  SCDs Start: 07/09/23 0446   Antimicrobials: None Fluid: Stop IV hydration today Consultants: None Family Communication: None at bedside  Status: Inpatient Level of care:  Telemetry Cardiac   Patient is from: Long-term nursing home Needs to continue in-hospital care: Continues to remain orthostatic and dizzy.  Medicines being adjusted.   Anticipated d/c to: Back to long-term care in 1 to 2 days    Diet:  Diet Order             Diet Carb Modified Fluid consistency: Thin; Room service appropriate? Yes  Diet effective now                   Scheduled Meds:  aspirin  81 mg Oral Daily   brimonidine  1 drop Both Eyes TID   dorzolamide-timolol  1 drop Right Eye BID   escitalopram  10 mg Oral Daily   finasteride  5 mg Oral Daily   gabapentin  100 mg Oral BID   insulin aspart  0-5 Units Subcutaneous QHS   insulin aspart  0-9 Units Subcutaneous TID WC   midodrine  5 mg Oral TID WC   polyethylene glycol  17 g Oral Daily   rosuvastatin  40 mg Oral QHS    PRN meds: acetaminophen **OR** acetaminophen, hydrocortisone, polyvinyl alcohol   Infusions:   cefTRIAXone (ROCEPHIN)  IV 1 g (07/12/23 0426)    Antimicrobials: Anti-infectives (From admission, onward)    Start     Dose/Rate Route Frequency Ordered Stop   07/10/23 0400  cefTRIAXone (ROCEPHIN) 1 g in sodium chloride 0.9 % 100 mL IVPB        1 g 200 mL/hr over 30 Minutes Intravenous Every 24 hours 07/09/23 0446     07/09/23 0315  cefTRIAXone (ROCEPHIN) 1 g in sodium chloride 0.9 % 100 mL IVPB        1 g 200 mL/hr over 30 Minutes Intravenous  Once 07/09/23 0311 07/09/23 0522       Objective: Vitals:   07/12/23 0834  07/12/23 1119  BP: (!) 92/59 (!) 93/58  Pulse: (!) 58 (!) 54  Resp: 16 15  Temp: 97.6 F (36.4 C) 98.1 F (36.7 C)  SpO2: 97% 95%    Intake/Output Summary (Last 24 hours) at 07/12/2023 1330 Last data filed at 07/12/2023 1300 Gross per 24 hour  Intake 340 ml  Output 1300 ml  Net -960 ml   Filed Weights   07/09/23 0752 07/12/23 0615  Weight: 95.3 kg 91.2 kg   Weight change:  Body mass index is 33.46 kg/m.   Physical Exam: General exam: Pleasant, elderly Caucasian male.  Not in pain Skin: No rashes, lesions or ulcers. HEENT: Atraumatic, normocephalic, no obvious bleeding.  Large melanoma/hemangioma in the right side of the face Lungs: Clear to auscultation bilaterally CVS: Regular  rate and rhythm, no murmur GI/Abd soft, nontender, nondistended, bowel sound present CNS: Alert, awake, oriented x 3 Psychiatry: Mood appropriate Extremities: No pedal edema, no calf tenderness  Data Review: I have personally reviewed the laboratory data and studies available.  F/u labs ordered Unresulted Labs (From admission, onward)    None      Total time spent in review of labs and imaging, patient evaluation, formulation of plan, documentation and communication with family: 45 minutes  Signed, Lorin Glass, MD [facility] 07/12/2023

## 2023-07-12 NOTE — TOC Progression Note (Signed)
Transition of Care [facility]) - Progression Note    Patient Details  Name: Xavier Turner MRN: [id number] Date of Birth: 1956-09-13  Transition of Care [facility]) CM/SW Contact  Dellie Burns [place], [place] Phone Number: 07/12/2023, 1:33 PM  Clinical Narrative:  per MD, possible dc over weekend back to SNF. Spoke to [place] at [facility] [phone] who confirmed pt is able to return over weekend and requested SW reach out to her to facilitate. MD updated. SW will follow.   Dellie Burns, MSW, LCSW [phone] (coverage)       Expected Discharge Plan: Skilled Nursing Facility Barriers to Discharge: Continued Medical Work up  Expected Discharge Plan and Services     Post Acute Care Choice: Skilled Nursing Facility Living arrangements for the past 2 months: Skilled Nursing Facility                                       Social Determinants of Health (SDOH) Interventions SDOH Screenings   Food Insecurity: No Food Insecurity (10/25/2021)   Received from [facility], [facility]  Transportation Needs: No Transportation Needs (10/25/2021)   Received from [facility], [facility]  Financial Resource Strain: Low Risk  (10/25/2021)   Received from [facility], [facility]  Physical Activity: Unknown (10/25/2021)   Received from [facility], [facility]  Recent Concern: Physical Activity - Inactive (10/25/2021)   Received from [facility]  Social Connections: Unknown (01/21/2022)   Received from [facility], [facility]  Recent Concern: Social Connections - Socially Isolated (10/25/2021)   Received from [facility], [facility]  Stress: Stress Concern Present (10/25/2021)   Received from [facility], [facility]  Tobacco Use: Low Risk  (07/08/2023)    Readmission Risk Interventions     No data to display

## 2023-07-12 NOTE — Progress Notes (Signed)
Physical Therapy Treatment Patient Details Name: Xavier Turner MRN: [id number] DOB: [birth year] Today's Date: 07/12/2023   History of Present Illness 67 y.o. male presents to [facility] hospital on 07/08/2023 after a fall preceded by near syncopal episode. Pt with hypotension in the ED, concern for possible UTI. PMH includes anemia, asthma, CKD, DMII, glaucoma, HTN, HLD, OSA, seizure disorder, Sturge-Weber syndrome, BPH, depression.    PT Comments  Pt in bed upon arrival and agreeable to PT session. Pt continues to require physical assistance for bed mobility and transfers. Pt able to stand with ModA and 1HH with 2 attempts. Pt is unsteady in standing and tends to lean posteriorly. Pt was able to move feet slightly towards [facility] but was unable to ambulate. Pt reported feeling dizzy and requested to return to the bed. BP 95/67, 76 upon sitting and 106/54, 69 when in supine, RN notified. Pt reported fatigue after transfer and attempt to take steps and requested to rest. Pt is progressing slowly towards goals. Current d/c recs remain appropriate. Acute PT to follow.    If plan is discharge home, recommend the following: A lot of help with walking and/or transfers;A lot of help with bathing/dressing/bathroom;Assistance with cooking/housework;Direct supervision/assist for medications management;Direct supervision/assist for financial management;Assist for transportation;Help with stairs or ramp for entrance   Can travel by private vehicle     No  Equipment Recommendations  None recommended by PT       Precautions / Restrictions Precautions Precautions: Fall Precaution Comments: L hemiplegia Restrictions Weight Bearing Restrictions: No     Mobility  Bed Mobility Overal bed mobility: Needs Assistance Bed Mobility: Supine to Sit, Sit to Supine     Supine to sit: Mod assist, HOB elevated Sit to supine: Min assist   General bed mobility comments: assist for trunk elevation with sup/sit with 1HH,  assist for LE management upon return to supine    Transfers Overall transfer level: Needs assistance Equipment used: 1 person hand held assist Transfers: Sit to/from Stand Sit to Stand: Mod assist           General transfer comment: Cues for pt to scoot hips towards EOB. Two attempts to clear bottom with rocks to build momentum. ModA with 1HH for pt to stand. Pt able to shuffle feet slightly towards HOB but unable to step.        Balance Overall balance assessment: Needs assistance Sitting-balance support: Single extremity supported Sitting balance-Leahy Scale: Poor     Standing balance support: Bilateral upper extremity supported Standing balance-Leahy Scale: Poor Standing balance comment: modA         Cognition Arousal: Alert Behavior During Therapy: Anxious (fear of falling) Overall Cognitive Status: History of cognitive impairments - at baseline      General Comments: pt with baseline cognitive deficits           General Comments General comments (skin integrity, edema, etc.): Pt reported feeling dizzy while in standing. Seated BP at EOB 95/67, 76 with symptoms of dizziness. BP in supine 106/54, 69. RN notified        PT Goals (current goals can now be found in the care plan section) Acute Rehab PT Goals Patient Stated Goal: to return to ambulation PT Goal Formulation: With patient Time For Goal Achievement: 07/24/23 Potential to Achieve Goals: Fair Progress towards PT goals: Progressing toward goals    Frequency    Min 1X/week       AM-PAC PT "6 Clicks" Mobility   Outcome Measure  Help  needed turning from your back to your side while in a flat bed without using bedrails?: A Little Help needed moving from lying on your back to sitting on the side of a flat bed without using bedrails?: A Lot Help needed moving to and from a bed to a chair (including a wheelchair)?: A Lot Help needed standing up from a chair using your arms (e.g., wheelchair or  bedside chair)?: A Lot Help needed to walk in hospital room?: Total Help needed climbing 3-5 steps with a railing? : Total 6 Click Score: 11    End of Session   Activity Tolerance: Patient tolerated treatment well Patient left: in bed;with call bell/phone within reach;with bed alarm set Nurse Communication: Mobility status PT Visit Diagnosis: Other abnormalities of gait and mobility (R26.89);Muscle weakness (generalized) (M62.81);History of falling (Z91.81)     Time: 5621-3086 PT Time Calculation (min) (ACUTE ONLY): 26 min  Charges:    $Therapeutic Activity: 23-37 mins PT General Charges $$ ACUTE PT VISIT: 1 Visit                     Xavier Turner, PT, DPT Secure Chat Preferred  Rehab Office [phone]   Arturo Morton Brion Aliment 07/12/2023, 11:11 AM

## 2023-07-12 NOTE — Care Management Important Message (Signed)
Important Message  Patient Details  Name: ASMIR LAUERSDORF MRN: [id number] Date of Birth: 03-22-1956   Important Message Given:        [name] 07/12/2023, 3:07 PM

## 2023-07-12 NOTE — Plan of Care (Signed)
CHL Tonsillectomy/Adenoidectomy, Postoperative PEDS care plan entered in error.

## 2023-07-12 NOTE — Progress Notes (Signed)
   07/11/23 2325  BiPAP/CPAP/SIPAP  $ Non-Invasive Home Ventilator  Subsequent  BiPAP/CPAP/SIPAP Pt Type Adult  BiPAP/CPAP/SIPAP DREAMSTATIOND  Reason BIPAP/CPAP not in use Non-compliant (pt refused)

## 2023-07-13 DIAGNOSIS — R42 Dizziness and giddiness: Secondary | ICD-10-CM | POA: Diagnosis not present

## 2023-07-13 LAB — GLUCOSE, CAPILLARY
Glucose-Capillary: 101 mg/dL — ABNORMAL HIGH (ref 70–99)
Glucose-Capillary: 118 mg/dL — ABNORMAL HIGH (ref 70–99)
Glucose-Capillary: 112 mg/dL — ABNORMAL HIGH (ref 70–99)
Glucose-Capillary: 162 mg/dL — ABNORMAL HIGH (ref 70–99)

## 2023-07-13 NOTE — Progress Notes (Signed)
PROGRESS NOTE    Xavier Turner  ZOX:[id number]  DOB: Feb 14, 1956  DOA: 07/08/2023 PCP: System, Provider Not In Outpatient Specialists:   Hospital course:  67 y.o. 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 10/14, patient was sent to the ED after an episode of dizziness, near syncope leading to fall and impact on head.  Following the fall, patient complained of right-sided facial pain, neck pain and mid back pain.  Of note, patient had recent readjustments in his blood pressure medications due to hypotension.   In the ED, patient was hypotensive to 80s and 90s.  He was unable to sit up due to recurrence of dizziness and could not tolerate checking orthostatic vital signs.   No fever, breathing on room air Initial labs with WBC count 7, lactic acid level normal, hemoglobin 9.3,  BMP unremarkable FOBT negative UA with large amount of leukocytes and microscopy showing >50 WBCs and few bacteria.   Urine culture was sent. Blood cultures collected.   Chest x-ray showed no active disease.   Pelvic x-ray negative for any traumatic injuries.   CT head negative for any acute intracranial abnormality.   CT C-spine and maxillofacial negative for acute findings.   EKG showed sinus rhythm and no acute ischemic changes.  Patient was started on IV ceftriaxone, meclizine, and 2 L LR.   Admitted to [facility]     Subjective:  Patient states he is doing well.  He admits to being weak, notes that he was dizzy when he was working with physical therapy.  Did not know he was socially drinking as much as possible, will try to drink more   Objective: Vitals:   07/13/23 0326 07/13/23 0907 07/13/23 1135 07/13/23 1630  BP: (!) 105/59 (!)  88/49 101/72 99/65  Pulse: (!) 50 63 (!) 55   Resp: 18 17 18    Temp: (!) 97.3 F (36.3 C) 97.7 F (36.5 C) 97.7 F (36.5 C)   TempSrc: Oral Oral Oral   SpO2: 97% 95% 95%   Weight:      Height:        Intake/Output Summary (Last 24 hours) at 07/13/2023 1702 Last data filed at 07/13/2023 0328 Gross per 24 hour  Intake --  Output 1200 ml  Net -1200 ml   Filed Weights   07/09/23 0752 07/12/23 0615 07/13/23 0220  Weight: 95.3 kg 91.2 kg 90.8 kg     Exam:  General: Patient with large facial hemangioma sitting up in bed in NAD Eyes: sclera anicteric, conjuctiva mild injection bilaterally CVS: S1-S2, regular  Respiratory:  decreased air entry bilaterally secondary to decreased inspiratory effort, rales at bases  GI: NABS, soft, NT  LE: Warm and well-perfused Neuro: A/O x 3,  grossly nonfocal.  Psych: patient is logical and coherent, judgement and insight appear normal, mood and affect appropriate to situation.  Data Reviewed:  Basic Metabolic Panel: Recent Labs  Lab 07/08/23 2049 07/09/23 0221 07/09/23 0747 07/10/23 0335 07/11/23 0258 07/12/23 0501  NA 133*  --  138 136 137 138  K 4.6  --  3.9 3.9 3.5 3.4*  CL 97*  --  103 100 105 106  CO2 26  --  26 27 24 23   GLUCOSE 93  --  94 101*  104* 105*  BUN 20  --  14 14 11 11   CREATININE 0.83  --  0.69 0.72 0.64 0.59*  CALCIUM 9.2  --  8.9 9.1 8.2* 8.8*  MG  --  1.9  --   --   --   --     CBC: Recent Labs  Lab 07/08/23 2049 07/09/23 0525 07/10/23 0335 07/11/23 0258 07/12/23 0501  WBC 8.4 7.0 8.4 6.4 5.9  NEUTROABS  --   --  6.3 4.1 3.8  HGB 10.2* 9.3* 9.9* 9.3* 9.6*  HCT 32.4* 30.5* 32.1* 29.6* 31.0*  MCV 90.3 87.9 88.2 87.3 86.8  PLT 188 185 203 178 173     Scheduled Meds:  aspirin  81 mg Oral Daily   brimonidine  1 drop Both Eyes TID   dorzolamide-timolol  1 drop Right Eye BID   escitalopram  10 mg Oral Daily   finasteride  5 mg Oral Daily   gabapentin  100 mg Oral BID   insulin aspart  0-5 Units  Subcutaneous QHS   insulin aspart  0-9 Units Subcutaneous TID WC   midodrine  5 mg Oral TID WC   polyethylene glycol  17 g Oral Daily   rosuvastatin  40 mg Oral QHS   Continuous Infusions:   Assessment & Plan:   Syncope Orthostatic hypotension Patient has been treated with IV fluid resuscitation  All his antihypertensives including HCTZ and olmesartan have been held Is presently on midodrine although blood pressure remains low His last echo was from January with normal EF and no RWMA, however as I have no clear reason for his new hypotension will repeat echo to evaluate systolic function and rule out any recent insults. If echo WNL, increase midodrine  UTI/pyuria He has completed 3 days of empiric IV ceftriaxone  DM2 Sugars under excellent control on present regimen  BPH Tamsulosin on hold given hypotension Patient is on finasteride Watch for urinary retention  Anxiety and depression Continue escitalopram  Sturge-Weber syndrome Hemiparesis congenital Seizure disorder Stable     DVT prophylaxis: SCD Code Status: DNR-limited/DNI Family Communication: None today     Studies: No results found.  Principal Problem:   Dizziness Active Problems:   Type 2 diabetes mellitus (HCC)   Hyperlipidemia associated with type 2 diabetes mellitus (HCC)   Glaucoma   UTI (urinary tract infection)   Anemia     Shadi Sessler Tublu Ysidra Sopher, [facility]  If 7PM-7AM, please contact night-coverage [email or web address]   LOS: 4 days

## 2023-07-13 NOTE — Plan of Care (Signed)

## 2023-07-13 NOTE — Progress Notes (Signed)
   07/13/23 0018  BiPAP/CPAP/SIPAP  Reason BIPAP/CPAP not in use Non-compliant (pt refused. pt said cpap is not comfortable)

## 2023-07-13 NOTE — Progress Notes (Signed)
   07/13/23 2012  BiPAP/CPAP/SIPAP  $ Non-Invasive Ventilator  Non-Invasive Vent Subsequent  BiPAP/CPAP/SIPAP Pt Type Adult  BiPAP/CPAP/SIPAP Resmed  Reason BIPAP/CPAP not in use Non-compliant  Mask Type Full face mask  Mask Size Medium

## 2023-07-13 NOTE — Progress Notes (Signed)
Mobility Specialist Progress Note:   07/13/23 1342  Mobility  Activity Transferred from bed to chair  Level of Assistance Modified independent, requires aide device or extra time  Assistive Device Front wheel walker  Distance Ambulated (ft) 3 ft  Activity Response Tolerated well  Mobility Referral Yes  $Mobility charge 1 Mobility  Mobility Specialist Start Time (ACUTE ONLY) 1135  Mobility Specialist Stop Time (ACUTE ONLY) 1152  Mobility Specialist Time Calculation (min) (ACUTE ONLY) 17 min   Pre Mobility: 65 HR , 101/72 BP  Pt received in bed, agreeable to transfer to chair. NT present in room d/t soiled gown and linen sheets. ModA +2 bed mobility. CG to stand. Pt displayed slight unsteadiness while standing, but no LOB present. Pt denied any discomfort during session, asymptomatic throughout. Pt left in chair with NT still in room.   Leory Plowman  Mobility Specialist Please contact via [employer] office at [phone]

## 2023-07-14 ENCOUNTER — Inpatient Hospital Stay (HOSPITAL_COMMUNITY): Payer: Medicare Other

## 2023-07-14 DIAGNOSIS — R55 Syncope and collapse: Secondary | ICD-10-CM

## 2023-07-14 DIAGNOSIS — R42 Dizziness and giddiness: Secondary | ICD-10-CM | POA: Diagnosis not present

## 2023-07-14 LAB — BASIC METABOLIC PANEL
Anion gap: 7 (ref 5–15)
BUN: 13 mg/dL (ref 8–23)
CO2: 25 mmol/L (ref 22–32)
Calcium: 8.6 mg/dL — ABNORMAL LOW (ref 8.9–10.3)
Chloride: 106 mmol/L (ref 98–111)
Creatinine, Ser: 0.54 mg/dL — ABNORMAL LOW (ref 0.61–1.24)
GFR, Estimated: >60 mL/min (ref >60)
Glucose, Bld: 103 mg/dL — ABNORMAL HIGH (ref 70–99)
Potassium: 3.9 mmol/L (ref 3.5–5.1)
Sodium: 138 mmol/L (ref 135–145)

## 2023-07-14 LAB — ECHOCARDIOGRAM COMPLETE
AR max vel: 2.97 cm2
AV Area VTI: 2.67 cm2
AV Area mean vel: 2.69 cm2
AV Mean grad: 6 mm[Hg]
AV Peak grad: 11 mm[Hg]
Ao pk vel: 1.66 m/s
Area-P 1/2: 3.02 cm2
Height: 65 in
S' Lateral: 2.7 cm
Weight: 3093.49 [oz_av]

## 2023-07-14 LAB — GLUCOSE, CAPILLARY
Glucose-Capillary: 95 mg/dL (ref 70–99)
Glucose-Capillary: 154 mg/dL — ABNORMAL HIGH (ref 70–99)
Glucose-Capillary: 109 mg/dL — ABNORMAL HIGH (ref 70–99)
Glucose-Capillary: 120 mg/dL — ABNORMAL HIGH (ref 70–99)

## 2023-07-14 LAB — CULTURE, BLOOD (ROUTINE X 2)
Culture: NO GROWTH
Culture: NO GROWTH
Special Requests: ADEQUATE
Special Requests: ADEQUATE

## 2023-07-14 NOTE — Progress Notes (Signed)
Mobility Specialist Progress Note:   07/14/23 1053  Mobility  Activity Transferred from bed to chair  Level of Assistance Moderate assist, patient does 50-74%  Assistive Device Front wheel walker  Distance Ambulated (ft) 4 ft  Activity Response Tolerated well  Mobility Referral Yes  $Mobility charge 1 Mobility  Mobility Specialist Start Time (ACUTE ONLY) 1010  Mobility Specialist Stop Time (ACUTE ONLY) 1030  Mobility Specialist Time Calculation (min) (ACUTE ONLY) 20 min    Pt received in bed, agreeable to transfer to chair. ModA bed mobility. MinA to stand. Pt needing 2x attempts to stand d/t general weakness. Pt displayed slight LOB while standing requiring seated rest break. After recovery pt able to stand and take couple steps from bedside to chair with CG. Pt c/o slight dizziness, otherwise asymptomatic throughout. Pt left in chair with call bell in reach and all needs met.    Leory Plowman  Mobility Specialist Please contact via [employer] office at [phone]

## 2023-07-14 NOTE — Progress Notes (Signed)
   07/14/23 2020  BiPAP/CPAP/SIPAP  BiPAP/CPAP/SIPAP Pt Type Adult  BiPAP/CPAP/SIPAP Resmed  Reason BIPAP/CPAP not in use Non-compliant  BiPAP/CPAP /SiPAP Vitals  Pulse Rate 62  Resp 18  SpO2 96 %

## 2023-07-14 NOTE — Progress Notes (Addendum)
PROGRESS NOTE    KEMONTA SEYMOUR  QMV:[id number]  DOB: Feb 03, 1956  DOA: 07/08/2023 PCP: System, Provider Not In Outpatient Specialists:   Hospital course:  67 y.o. 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 10/14, patient was sent to the ED after an episode of dizziness, near syncope leading to fall and impact on head.  Following the fall, patient complained of right-sided facial pain, neck pain and mid back pain.  Of note, patient had recent readjustments in his blood pressure medications due to hypotension.   In the ED, patient was hypotensive to 80s and 90s.  He was unable to sit up due to recurrence of dizziness and could not tolerate checking orthostatic vital signs.   No fever, breathing on room air Initial labs with WBC count 7, lactic acid level normal, hemoglobin 9.3,  BMP unremarkable FOBT negative UA with large amount of leukocytes and microscopy showing >50 WBCs and few bacteria.   Urine culture was sent. Blood cultures collected.   Chest x-ray showed no active disease.   Pelvic x-ray negative for any traumatic injuries.   CT head negative for any acute intracranial abnormality.   CT C-spine and maxillofacial negative for acute findings.   EKG showed sinus rhythm and no acute ischemic changes.  Patient was started on IV ceftriaxone, meclizine, and 2 L LR.   Admitted to [facility]     Subjective:  Patient without any acute complaints, still feeling weak however dizziness much less when working with mobility specialist to get to recliner.   Objective: Vitals:   07/14/23 0525 07/14/23 0804 07/14/23 1240 07/14/23 1621  BP: 121/61 (!) 103/47 108/61 105/70  Pulse: (!) 53 (!) 57 (!) 46 61  Resp: 18 12  20 20   Temp: (!) 97.5 F (36.4 C) 98.1 F (36.7 C) 98.9 F (37.2 C) (!) 97.5 F (36.4 C)  TempSrc: Oral Oral Oral Oral  SpO2: 92% 94% 95% 99%  Weight: 87.7 kg     Height:        Intake/Output Summary (Last 24 hours) at 07/14/2023 1721 Last data filed at 07/14/2023 1240 Gross per 24 hour  Intake 240 ml  Output 1400 ml  Net -1160 ml   Filed Weights   07/12/23 0615 07/13/23 0220 07/14/23 0525  Weight: 91.2 kg 90.8 kg 87.7 kg     Exam:  General: Patient with large facial hemangioma sitting up in [place] in NAD Eyes: sclera anicteric, conjuctiva mild injection bilaterally CVS: S1-S2, regular  Respiratory:  decreased air entry bilaterally secondary to decreased inspiratory effort, rales at bases  GI: NABS, soft, NT  LE: Warm and well-perfused   Data Reviewed:  Basic Metabolic Panel: Recent Labs  Lab 07/09/23 0221 07/09/23 0747 07/10/23 0335 07/11/23 0258 07/12/23 0501 07/14/23 0234  NA  --  138 136 137 138 138  K  --  3.9 3.9 3.5 3.4* 3.9  CL  --  103 100 105 106 106  CO2  --  26 27 24 23 25   GLUCOSE  --  94 101* 104* 105* 103*  BUN  --  14 14 11 11 13   CREATININE  --  0.69 0.72 0.64 0.59* 0.54*  CALCIUM  --  8.9 9.1 8.2* 8.8* 8.6*  MG 1.9  --   --   --   --   --  CBC: Recent Labs  Lab 07/08/23 2049 07/09/23 0525 07/10/23 0335 07/11/23 0258 07/12/23 0501  WBC 8.4 7.0 8.4 6.4 5.9  NEUTROABS  --   --  6.3 4.1 3.8  HGB 10.2* 9.3* 9.9* 9.3* 9.6*  HCT 32.4* 30.5* 32.1* 29.6* 31.0*  MCV 90.3 87.9 88.2 87.3 86.8  PLT 188 185 203 178 173     Scheduled Meds:  aspirin  81 mg Oral Daily   brimonidine  1 drop Both Eyes TID   dorzolamide-timolol  1 drop Right Eye BID   escitalopram  10 mg Oral Daily   finasteride  5 mg Oral Daily   gabapentin  100 mg Oral BID   insulin aspart  0-5 Units Subcutaneous QHS   insulin aspart  0-9 Units Subcutaneous TID WC   midodrine  5 mg Oral TID WC   polyethylene glycol  17 g Oral Daily   rosuvastatin  40 mg Oral QHS    Continuous Infusions:   Assessment & Plan:   Syncope Orthostatic hypotension Patient seems to be clinically improving, was able to stand up and moved to recliner with assistance of mobility specialist without significant dizziness as previous. Unclear why patient's BP continues to remain so low All his antihypertensives including HCTZ and olmesartan have been held Is presently on midodrine although blood pressure remains low Repeat echocardiogram done today shows EF of 60 to 65%, no AAS or AR, no significant change from prior study  UTI/pyuria He has completed 3 days of empiric IV ceftriaxone  DM2 Sugars under excellent control on present regimen  BPH Tamsulosin on hold given hypotension Patient is on finasteride Watch for urinary retention  Anxiety and depression Continue escitalopram  Sturge-Weber syndrome Hemiparesis congenital Seizure disorder Stable     DVT prophylaxis: SCD Code Status: DNR-limited/DNI Family Communication: None today     Studies: ECHOCARDIOGRAM COMPLETE  Result Date: 07/14/2023    ECHOCARDIOGRAM REPORT   Patient Name:   ANEESH WHITTENBERGER Date of Exam: 07/14/2023 Medical Rec #:  [id number]     Height:       65.0 in Accession #:    [id number]    Weight:       193.3 lb Date of Birth:  08-31-1956     BSA:          1.950 m Patient Age:    67 years      BP:           121/61 mmHg Patient Gender: M             HR:           60 bpm. Exam Location:  Inpatient Procedure: 2D Echo, Color Doppler and Cardiac Doppler Indications:    R55 Syncope  History:        Patient has prior history of Echocardiogram examinations. Risk                 Factors:Diabetes, Dyslipidemia and Sleep Apnea.  Sonographer:    Irving Burton Senior RDCS Referring Phys: [id number] Clorene Nerio TUBLU Jailani Hogans IMPRESSIONS  1. Left ventricular ejection fraction, by estimation, is 65 to 70%. The left ventricle has normal function. The left ventricle has no regional wall motion abnormalities. Left ventricular  diastolic parameters are indeterminate.  2. Right ventricular systolic function is normal. The right ventricular size is normal.  3. Left atrial size was mildly dilated.  4. The mitral valve is normal in structure. No evidence of mitral valve regurgitation. No evidence of mitral stenosis.  5.  The aortic valve is tricuspid. Aortic valve regurgitation is not visualized. No aortic stenosis is present.  6. The inferior vena cava is dilated in size with <50% respiratory variability, suggesting right atrial pressure of 15 mmHg. Comparison(s): No significant change from prior study. Prior images reviewed side by side. FINDINGS  Left Ventricle: Left ventricular ejection fraction, by estimation, is 65 to 70%. The left ventricle has normal function. The left ventricle has no regional wall motion abnormalities. The left ventricular internal cavity size was normal in size. There is  no left ventricular hypertrophy. Left ventricular diastolic parameters are indeterminate. Normal left ventricular filling pressure. Right Ventricle: The right ventricular size is normal. No increase in right ventricular wall thickness. Right ventricular systolic function is normal. Left Atrium: Left atrial size was mildly dilated. Right Atrium: Right atrial size was normal in size. Pericardium: There is no evidence of pericardial effusion. Mitral Valve: The mitral valve is normal in structure. No evidence of mitral valve regurgitation. No evidence of mitral valve stenosis. Tricuspid Valve: The tricuspid valve is normal in structure. Tricuspid valve regurgitation is trivial. No evidence of tricuspid stenosis. Aortic Valve: The aortic valve is tricuspid. Aortic valve regurgitation is not visualized. No aortic stenosis is present. Aortic valve mean gradient measures 6.0 mmHg. Aortic valve peak gradient measures 11.0 mmHg. Aortic valve area, by VTI measures 2.67  cm. Pulmonic Valve: The pulmonic valve was normal in structure. Pulmonic valve  regurgitation is not visualized. No evidence of pulmonic stenosis. Aorta: The aortic root and ascending aorta are structurally normal, with no evidence of dilitation. Venous: The inferior vena cava is dilated in size with less than 50% respiratory variability, suggesting right atrial pressure of 15 mmHg. IAS/Shunts: No atrial level shunt detected by color flow Doppler.  LEFT VENTRICLE PLAX 2D LVIDd:         4.70 cm   Diastology LVIDs:         2.70 cm   LV e' medial:    8.38 cm/s LV PW:         0.90 cm   LV E/e' medial:  13.5 LV IVS:        0.70 cm   LV e' lateral:   8.49 cm/s LVOT diam:     2.00 cm   LV E/e' lateral: 13.3 LV SV:         94 LV SV Index:   48 LVOT Area:     3.14 cm  RIGHT VENTRICLE RV S prime:     14.00 cm/s TAPSE (M-mode): 2.6 cm LEFT ATRIUM             Index        RIGHT ATRIUM           Index LA diam:        3.90 cm 2.00 cm/m   RA Area:     13.10 cm LA Vol (A2C):   49.8 ml 25.54 ml/m  RA Volume:   29.20 ml  14.98 ml/m LA Vol (A4C):   71.0 ml 36.41 ml/m LA Biplane Vol: 64.8 ml 33.23 ml/m  AORTIC VALVE AV Area (Vmax):    2.97 cm AV Area (Vmean):   2.69 cm AV Area (VTI):     2.67 cm AV Vmax:           166.00 cm/s AV Vmean:          116.000 cm/s AV VTI:            0.353 m AV Peak  Grad:      11.0 mmHg AV Mean Grad:      6.0 mmHg LVOT Vmax:         157.00 cm/s LVOT Vmean:        99.300 cm/s LVOT VTI:          0.300 m LVOT/AV VTI ratio: 0.85  AORTA Ao Root diam: 2.70 cm Ao Asc diam:  3.40 cm MITRAL VALVE                TRICUSPID VALVE MV Area (PHT): 3.02 cm     TR Peak grad:   28.7 mmHg MV Decel Time: 251 msec     TR Vmax:        268.00 cm/s MV E velocity: 113.00 cm/s MV A velocity: 74.90 cm/s   SHUNTS MV E/A ratio:  1.51         Systemic VTI:  0.30 m                             Systemic Diam: 2.00 cm Riley Lam MD Electronically signed by Riley Lam MD Signature Date/Time: 07/14/2023/4:32:38 PM    Final     Principal Problem:   Dizziness Active Problems:   Type 2  diabetes mellitus (HCC)   Hyperlipidemia associated with type 2 diabetes mellitus (HCC)   Glaucoma   UTI (urinary tract infection)   Anemia     Nadie Fiumara Tublu Yolanda Huffstetler, [facility]  If 7PM-7AM, please contact night-coverage [email or web address]   LOS: 5 days

## 2023-07-14 NOTE — Plan of Care (Signed)

## 2023-07-15 DIAGNOSIS — R42 Dizziness and giddiness: Secondary | ICD-10-CM | POA: Diagnosis not present

## 2023-07-15 LAB — GLUCOSE, CAPILLARY
Glucose-Capillary: 100 mg/dL — ABNORMAL HIGH (ref 70–99)
Glucose-Capillary: 110 mg/dL — ABNORMAL HIGH (ref 70–99)
Glucose-Capillary: 117 mg/dL — ABNORMAL HIGH (ref 70–99)
Glucose-Capillary: 93 mg/dL (ref 70–99)

## 2023-07-15 LAB — CORTISOL: Cortisol, Plasma: 9.3 ug/dL

## 2023-07-15 NOTE — TOC Progression Note (Signed)
Transition of Care [facility]) - Progression Note    Patient Details  Name: Xavier Turner MRN: [id number] Date of Birth: [birth year]  [facility] CM/SW Contact  Eduard Roux, [place] Phone Number: 07/15/2023, 11:02 AM  Clinical Narrative:     TOC  following for medical readiness  Will d/c to [facility] when stable.  Expected Discharge Plan: Skilled Nursing Facility Barriers to Discharge: Continued Medical Work up  Expected Discharge Plan and Services     Post Acute Care Choice: Skilled Nursing Facility Living arrangements for the past 2 months: Skilled Nursing Facility                                       Social Determinants of Health (SDOH) Interventions SDOH Screenings   Food Insecurity: No Food Insecurity (10/25/2021)   Received from [facility], [facility]  Transportation Needs: No Transportation Needs (10/25/2021)   Received from [facility], [facility]  Financial Resource Strain: Low Risk  (10/25/2021)   Received from [facility], [facility]  Physical Activity: Unknown (10/25/2021)   Received from [facility], [facility]  Recent Concern: Physical Activity - Inactive (10/25/2021)   Received from [facility]  Social Connections: Unknown (01/21/2022)   Received from [facility], [facility]  Recent Concern: Social Connections - Socially Isolated (10/25/2021)   Received from [facility], [facility]  Stress: Stress Concern Present (10/25/2021)   Received from [facility], [facility]  Tobacco Use: Low Risk  (07/08/2023)    Readmission Risk Interventions     No data to display

## 2023-07-15 NOTE — Progress Notes (Signed)
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  He was hypotensive into the 80s-90s in the ED, there were initial concerns about him having a UTI, he was started on antibiotics and admitted to the hospital.  Subjective / 24h Interval events: Feeling better, has not been up today.   Assesement and Plan: Principal Problem:   Dizziness Active Problems:   Type 2 diabetes mellitus (HCC)   Hyperlipidemia associated with type 2 diabetes mellitus (HCC)   Glaucoma   UTI (urinary tract infection)   Anemia  Principal problem Hypotension -symptomatic, not entirely clear as to why.  He is on olmesartan as an outpatient and it is now on hold.  Cortisol unremarkable. -Query dehydration versus autonomic issues.  Now seems better after received fluids and has been placed on midodrine. -Soft blood pressure this morning, working with physical therapy pending today  Active problems UTI/pyuria-he has completed course of IV antibiotics.  May have contributed to #1  BPH-hold tamsulosin, continue finasteride  Anxiety/depression-continue escitalopram  Sturge-Weber syndrome, congenital hemiparesis, seizure disorder-stable  DM2-controlled  Lab Results  Component Value Date   HGBA1C 6.4 (H) 07/09/2023   CBG (last 3)  Recent Labs    07/14/23 2129 07/15/23 0606 07/15/23 1155  GLUCAP 120* 100* 110*    Scheduled Meds:  aspirin  81 mg Oral Daily   brimonidine  1 drop Both Eyes TID   dorzolamide-timolol  1 drop Right Eye BID   escitalopram  10 mg Oral Daily   finasteride  5 mg Oral Daily   gabapentin  100 mg Oral BID    insulin aspart  0-5 Units Subcutaneous QHS   insulin aspart  0-9 Units Subcutaneous TID WC   midodrine  5 mg Oral TID WC   polyethylene glycol  17 g Oral Daily   rosuvastatin  40 mg Oral QHS   Continuous Infusions: PRN Meds:.acetaminophen **OR** acetaminophen, hydrocortisone, polyvinyl alcohol  Current Outpatient Medications  Medication Instructions   acetaminophen (TYLENOL) 650 mg, Oral, Every 6 hours PRN   aspirin 81 mg, Oral, Daily   brimonidine (ALPHAGAN) 0.2 % ophthalmic solution 1 drop, Both Eyes, 3 times daily   carboxymethylcellulose (REFRESH PLUS) 0.5 % SOLN 1 drop, Both Eyes, Every 6 hours PRN   dorzolamide-timolol (COSOPT) 22.3-6.8 MG/ML ophthalmic solution 1 drop, Right Eye, 2 times daily   Emollient (EUCERIN) lotion 1 Application, Topical, Every 12 hours PRN   escitalopram (LEXAPRO) 10 mg, Oral, Daily   fenofibrate (TRICOR) 48 mg, Oral, Daily   gabapentin (NEURONTIN) 100 mg, Oral, 2 times daily   hydrocortisone (ANUSOL-HC) 25 mg, Rectal, Every 8 hours PRN   Jardiance 10 mg, Oral, Daily   Menthol, Topical Analgesic, 5 % GEL 1 application , Apply externally, 2 times daily, Apply to bilateral knees topically twice a day   metFORMIN (GLUCOPHAGE) 1,000 mg, Oral, 2 times daily with meals   olmesartan (BENICAR) 5 mg, Oral, Daily   polyethylene glycol (MIRALAX / GLYCOLAX) 17 g, Oral, Daily, Mix in 8 ounces of water.   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  He was hypotensive into the 80s-90s in the ED, there were initial concerns about him having a UTI, he was started on antibiotics and admitted to the hospital.  Subjective / 24h Interval events: Feeling better, has not been up today.   Assesement and Plan: Principal Problem:   Dizziness Active Problems:   Type 2 diabetes mellitus (HCC)   Hyperlipidemia associated with type 2 diabetes mellitus (HCC)   Glaucoma   UTI (urinary tract infection)   Anemia  Principal problem Hypotension -symptomatic, not entirely clear as to why.  He is on olmesartan as an outpatient and it is now on hold.  Cortisol unremarkable. -Query dehydration versus autonomic issues.  Now seems better after received fluids and has been placed on midodrine. -Soft blood pressure this morning, working with physical therapy pending today  Active problems UTI/pyuria-he has completed course of IV antibiotics.  May have contributed to #1  BPH-hold tamsulosin, continue finasteride  Anxiety/depression-continue escitalopram  Sturge-Weber syndrome, congenital hemiparesis, seizure disorder-stable  DM2-controlled  Lab Results  Component Value Date   HGBA1C 6.4 (H) 07/09/2023   CBG (last 3)  Recent Labs    07/14/23 2129 07/15/23 0606 07/15/23 1155  GLUCAP 120* 100* 110*    Scheduled Meds:  aspirin  81 mg Oral Daily   brimonidine  1 drop Both Eyes TID   dorzolamide-timolol  1 drop Right Eye BID   escitalopram  10 mg Oral Daily   finasteride  5 mg Oral Daily   gabapentin  100 mg Oral BID    insulin aspart  0-5 Units Subcutaneous QHS   insulin aspart  0-9 Units Subcutaneous TID WC   midodrine  5 mg Oral TID WC   polyethylene glycol  17 g Oral Daily   rosuvastatin  40 mg Oral QHS   Continuous Infusions: PRN Meds:.acetaminophen **OR** acetaminophen, hydrocortisone, polyvinyl alcohol  Current Outpatient Medications  Medication Instructions   acetaminophen (TYLENOL) 650 mg, Oral, Every 6 hours PRN   aspirin 81 mg, Oral, Daily   brimonidine (ALPHAGAN) 0.2 % ophthalmic solution 1 drop, Both Eyes, 3 times daily   carboxymethylcellulose (REFRESH PLUS) 0.5 % SOLN 1 drop, Both Eyes, Every 6 hours PRN   dorzolamide-timolol (COSOPT) 22.3-6.8 MG/ML ophthalmic solution 1 drop, Right Eye, 2 times daily   Emollient (EUCERIN) lotion 1 Application, Topical, Every 12 hours PRN   escitalopram (LEXAPRO) 10 mg, Oral, Daily   fenofibrate (TRICOR) 48 mg, Oral, Daily   gabapentin (NEURONTIN) 100 mg, Oral, 2 times daily   hydrocortisone (ANUSOL-HC) 25 mg, Rectal, Every 8 hours PRN   Jardiance 10 mg, Oral, Daily   Menthol, Topical Analgesic, 5 % GEL 1 application , Apply externally, 2 times daily, Apply to bilateral knees topically twice a day   metFORMIN (GLUCOPHAGE) 1,000 mg, Oral, 2 times daily with meals   olmesartan (BENICAR) 5 mg, Oral, Daily   polyethylene glycol (MIRALAX / GLYCOLAX) 17 g, Oral, Daily, Mix in 8 ounces of water.   ROCKLATAN 0.02-0.005 % SOLN 1 drop, Both Eyes, Daily at bedtime   rosuvastatin (CRESTOR) 40 mg, Oral, Daily at bedtime   sitaGLIPtin (JANUVIA) 100  [facility], [address] [place], [place] [address]    Report Status 07/14/2023 FINAL  Final  Blood culture (routine x 2)     Status: None   Collection Time: 07/09/23  3:59 AM   Specimen: BLOOD  Result Value Ref Range Status   Specimen Description BLOOD RIGHT ANTECUBITAL  Final   Special Requests   Final    BOTTLES DRAWN AEROBIC AND ANAEROBIC Blood Culture adequate volume   Culture   Final    NO GROWTH 5 DAYS Performed at [facility], [address], [place], [place] [address]    Report Status 07/14/2023 FINAL  Final     Radiology Studies: ECHOCARDIOGRAM COMPLETE  Result Date: 07/14/2023    ECHOCARDIOGRAM REPORT   Patient Name:   Xavier Turner Date of Exam: 07/14/2023 Medical Rec #:  [id number]     Height:       65.0 in Accession #:    [id number]    Weight:       193.3 lb Date of Birth:  Oct 01, 1955     BSA:          1.950 m Patient Age:    67 years      BP:           121/61 mmHg Patient Gender: M             HR:           60 bpm. Exam Location:  Inpatient Procedure: 2D Echo, Color Doppler and  Cardiac Doppler Indications:    R55 Syncope  History:        Patient has prior history of Echocardiogram examinations. Risk                 Factors:Diabetes, Dyslipidemia and Sleep Apnea.  Sonographer:    Irving Burton Senior RDCS Referring Phys: [id number] SROBONA TUBLU CHATTERJEE IMPRESSIONS  1. Left ventricular ejection fraction, by estimation, is 65 to 70%. The left ventricle has normal function. The left ventricle has no regional wall motion abnormalities. Left ventricular diastolic parameters are indeterminate.  2. Right ventricular systolic function is normal. The right ventricular size is normal.  3. Left atrial size was mildly dilated.  4. The mitral valve is normal in structure. No evidence of mitral valve regurgitation. No evidence of mitral stenosis.  5. The aortic valve is tricuspid. Aortic valve regurgitation is not visualized. No aortic stenosis is present.  6. The inferior vena cava is dilated in size with <50% respiratory variability, suggesting right atrial pressure of 15 mmHg. Comparison(s): No significant change from prior study. Prior images reviewed side by side. FINDINGS  Left Ventricle: Left ventricular ejection fraction, by estimation, is 65 to 70%. The left ventricle has normal function. The left ventricle has no regional wall motion abnormalities. The left ventricular internal cavity size was normal in size. There is  no left ventricular hypertrophy. Left ventricular diastolic parameters are indeterminate. Normal left ventricular filling pressure. Right Ventricle: The right ventricular size is normal. No increase in right ventricular wall thickness. Right ventricular systolic function is normal. Left Atrium: Left atrial size was mildly dilated. Right Atrium: Right atrial size was normal in size. Pericardium: There is no evidence of pericardial effusion. Mitral Valve: The mitral valve is normal in structure. No evidence of mitral valve regurgitation. No evidence of mitral valve stenosis. Tricuspid  Valve: The tricuspid valve is normal in structure. Tricuspid valve regurgitation is trivial. No evidence of tricuspid stenosis. 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  He was hypotensive into the 80s-90s in the ED, there were initial concerns about him having a UTI, he was started on antibiotics and admitted to the hospital.  Subjective / 24h Interval events: Feeling better, has not been up today.   Assesement and Plan: Principal Problem:   Dizziness Active Problems:   Type 2 diabetes mellitus (HCC)   Hyperlipidemia associated with type 2 diabetes mellitus (HCC)   Glaucoma   UTI (urinary tract infection)   Anemia  Principal problem Hypotension -symptomatic, not entirely clear as to why.  He is on olmesartan as an outpatient and it is now on hold.  Cortisol unremarkable. -Query dehydration versus autonomic issues.  Now seems better after received fluids and has been placed on midodrine. -Soft blood pressure this morning, working with physical therapy pending today  Active problems UTI/pyuria-he has completed course of IV antibiotics.  May have contributed to #1  BPH-hold tamsulosin, continue finasteride  Anxiety/depression-continue escitalopram  Sturge-Weber syndrome, congenital hemiparesis, seizure disorder-stable  DM2-controlled  Lab Results  Component Value Date   HGBA1C 6.4 (H) 07/09/2023   CBG (last 3)  Recent Labs    07/14/23 2129 07/15/23 0606 07/15/23 1155  GLUCAP 120* 100* 110*    Scheduled Meds:  aspirin  81 mg Oral Daily   brimonidine  1 drop Both Eyes TID   dorzolamide-timolol  1 drop Right Eye BID   escitalopram  10 mg Oral Daily   finasteride  5 mg Oral Daily   gabapentin  100 mg Oral BID    insulin aspart  0-5 Units Subcutaneous QHS   insulin aspart  0-9 Units Subcutaneous TID WC   midodrine  5 mg Oral TID WC   polyethylene glycol  17 g Oral Daily   rosuvastatin  40 mg Oral QHS   Continuous Infusions: PRN Meds:.acetaminophen **OR** acetaminophen, hydrocortisone, polyvinyl alcohol  Current Outpatient Medications  Medication Instructions   acetaminophen (TYLENOL) 650 mg, Oral, Every 6 hours PRN   aspirin 81 mg, Oral, Daily   brimonidine (ALPHAGAN) 0.2 % ophthalmic solution 1 drop, Both Eyes, 3 times daily   carboxymethylcellulose (REFRESH PLUS) 0.5 % SOLN 1 drop, Both Eyes, Every 6 hours PRN   dorzolamide-timolol (COSOPT) 22.3-6.8 MG/ML ophthalmic solution 1 drop, Right Eye, 2 times daily   Emollient (EUCERIN) lotion 1 Application, Topical, Every 12 hours PRN   escitalopram (LEXAPRO) 10 mg, Oral, Daily   fenofibrate (TRICOR) 48 mg, Oral, Daily   gabapentin (NEURONTIN) 100 mg, Oral, 2 times daily   hydrocortisone (ANUSOL-HC) 25 mg, Rectal, Every 8 hours PRN   Jardiance 10 mg, Oral, Daily   Menthol, Topical Analgesic, 5 % GEL 1 application , Apply externally, 2 times daily, Apply to bilateral knees topically twice a day   metFORMIN (GLUCOPHAGE) 1,000 mg, Oral, 2 times daily with meals   olmesartan (BENICAR) 5 mg, Oral, Daily   polyethylene glycol (MIRALAX / GLYCOLAX) 17 g, Oral, Daily, Mix in 8 ounces of water.   ROCKLATAN 0.02-0.005 % SOLN 1 drop, Both Eyes, Daily at bedtime   rosuvastatin (CRESTOR) 40 mg, Oral, Daily at bedtime   sitaGLIPtin (JANUVIA) 100  [facility], [address] [place], [place] [address]    Report Status 07/14/2023 FINAL  Final  Blood culture (routine x 2)     Status: None   Collection Time: 07/09/23  3:59 AM   Specimen: BLOOD  Result Value Ref Range Status   Specimen Description BLOOD RIGHT ANTECUBITAL  Final   Special Requests   Final    BOTTLES DRAWN AEROBIC AND ANAEROBIC Blood Culture adequate volume   Culture   Final    NO GROWTH 5 DAYS Performed at [facility], [address], [place], [place] [address]    Report Status 07/14/2023 FINAL  Final     Radiology Studies: ECHOCARDIOGRAM COMPLETE  Result Date: 07/14/2023    ECHOCARDIOGRAM REPORT   Patient Name:   Xavier Turner Date of Exam: 07/14/2023 Medical Rec #:  [id number]     Height:       65.0 in Accession #:    [id number]    Weight:       193.3 lb Date of Birth:  Oct 01, 1955     BSA:          1.950 m Patient Age:    67 years      BP:           121/61 mmHg Patient Gender: M             HR:           60 bpm. Exam Location:  Inpatient Procedure: 2D Echo, Color Doppler and  Cardiac Doppler Indications:    R55 Syncope  History:        Patient has prior history of Echocardiogram examinations. Risk                 Factors:Diabetes, Dyslipidemia and Sleep Apnea.  Sonographer:    Irving Burton Senior RDCS Referring Phys: [id number] SROBONA TUBLU CHATTERJEE IMPRESSIONS  1. Left ventricular ejection fraction, by estimation, is 65 to 70%. The left ventricle has normal function. The left ventricle has no regional wall motion abnormalities. Left ventricular diastolic parameters are indeterminate.  2. Right ventricular systolic function is normal. The right ventricular size is normal.  3. Left atrial size was mildly dilated.  4. The mitral valve is normal in structure. No evidence of mitral valve regurgitation. No evidence of mitral stenosis.  5. The aortic valve is tricuspid. Aortic valve regurgitation is not visualized. No aortic stenosis is present.  6. The inferior vena cava is dilated in size with <50% respiratory variability, suggesting right atrial pressure of 15 mmHg. Comparison(s): No significant change from prior study. Prior images reviewed side by side. FINDINGS  Left Ventricle: Left ventricular ejection fraction, by estimation, is 65 to 70%. The left ventricle has normal function. The left ventricle has no regional wall motion abnormalities. The left ventricular internal cavity size was normal in size. There is  no left ventricular hypertrophy. Left ventricular diastolic parameters are indeterminate. Normal left ventricular filling pressure. Right Ventricle: The right ventricular size is normal. No increase in right ventricular wall thickness. Right ventricular systolic function is normal. Left Atrium: Left atrial size was mildly dilated. Right Atrium: Right atrial size was normal in size. Pericardium: There is no evidence of pericardial effusion. Mitral Valve: The mitral valve is normal in structure. No evidence of mitral valve regurgitation. No evidence of mitral valve stenosis. Tricuspid  Valve: The tricuspid valve is normal in structure. Tricuspid valve regurgitation is trivial. No evidence of tricuspid stenosis. Aortic Valve: The aortic valve  [facility], [address] [place], [place] [address]    Report Status 07/14/2023 FINAL  Final  Blood culture (routine x 2)     Status: None   Collection Time: 07/09/23  3:59 AM   Specimen: BLOOD  Result Value Ref Range Status   Specimen Description BLOOD RIGHT ANTECUBITAL  Final   Special Requests   Final    BOTTLES DRAWN AEROBIC AND ANAEROBIC Blood Culture adequate volume   Culture   Final    NO GROWTH 5 DAYS Performed at [facility], [address], [place], [place] [address]    Report Status 07/14/2023 FINAL  Final     Radiology Studies: ECHOCARDIOGRAM COMPLETE  Result Date: 07/14/2023    ECHOCARDIOGRAM REPORT   Patient Name:   Xavier Turner Date of Exam: 07/14/2023 Medical Rec #:  [id number]     Height:       65.0 in Accession #:    [id number]    Weight:       193.3 lb Date of Birth:  Oct 01, 1955     BSA:          1.950 m Patient Age:    67 years      BP:           121/61 mmHg Patient Gender: M             HR:           60 bpm. Exam Location:  Inpatient Procedure: 2D Echo, Color Doppler and  Cardiac Doppler Indications:    R55 Syncope  History:        Patient has prior history of Echocardiogram examinations. Risk                 Factors:Diabetes, Dyslipidemia and Sleep Apnea.  Sonographer:    Irving Burton Senior RDCS Referring Phys: [id number] SROBONA TUBLU CHATTERJEE IMPRESSIONS  1. Left ventricular ejection fraction, by estimation, is 65 to 70%. The left ventricle has normal function. The left ventricle has no regional wall motion abnormalities. Left ventricular diastolic parameters are indeterminate.  2. Right ventricular systolic function is normal. The right ventricular size is normal.  3. Left atrial size was mildly dilated.  4. The mitral valve is normal in structure. No evidence of mitral valve regurgitation. No evidence of mitral stenosis.  5. The aortic valve is tricuspid. Aortic valve regurgitation is not visualized. No aortic stenosis is present.  6. The inferior vena cava is dilated in size with <50% respiratory variability, suggesting right atrial pressure of 15 mmHg. Comparison(s): No significant change from prior study. Prior images reviewed side by side. FINDINGS  Left Ventricle: Left ventricular ejection fraction, by estimation, is 65 to 70%. The left ventricle has normal function. The left ventricle has no regional wall motion abnormalities. The left ventricular internal cavity size was normal in size. There is  no left ventricular hypertrophy. Left ventricular diastolic parameters are indeterminate. Normal left ventricular filling pressure. Right Ventricle: The right ventricular size is normal. No increase in right ventricular wall thickness. Right ventricular systolic function is normal. Left Atrium: Left atrial size was mildly dilated. Right Atrium: Right atrial size was normal in size. Pericardium: There is no evidence of pericardial effusion. Mitral Valve: The mitral valve is normal in structure. No evidence of mitral valve regurgitation. No evidence of mitral valve stenosis. Tricuspid  Valve: The tricuspid valve is normal in structure. Tricuspid valve regurgitation is trivial. No evidence of tricuspid stenosis. Aortic Valve: The aortic valve

## 2023-07-15 NOTE — Plan of Care (Signed)

## 2023-07-15 NOTE — Progress Notes (Signed)
Contacted family about Netarsudil/latanoprost or Rocklatan eyedrops.  Family unable to bring eyedrops as pt is in a facility and the facility has pts medication.  Pharmacy notified.

## 2023-07-15 NOTE — Plan of Care (Signed)
  Problem: Metabolic: Goal: Ability to maintain appropriate glucose levels will improve Outcome: Progressing   

## 2023-07-16 ENCOUNTER — Telehealth: Payer: Self-pay | Admitting: *Deleted

## 2023-07-16 DIAGNOSIS — R42 Dizziness and giddiness: Secondary | ICD-10-CM | POA: Diagnosis not present

## 2023-07-16 LAB — GLUCOSE, CAPILLARY
Glucose-Capillary: 134 mg/dL — ABNORMAL HIGH (ref 70–99)
Glucose-Capillary: 125 mg/dL — ABNORMAL HIGH (ref 70–99)
Glucose-Capillary: 99 mg/dL (ref 70–99)
Glucose-Capillary: 249 mg/dL — ABNORMAL HIGH (ref 70–99)

## 2023-07-16 MED ORDER — MIDODRINE HCL 5 MG PO TABS: 5.0000 mg | ORAL_TABLET | Freq: Three times a day (TID) | ORAL | Status: AC

## 2023-07-16 NOTE — Progress Notes (Signed)
Order to discharge pt to [facility].  Discharge instructions/AVS placed in discharge packet.  Pt advised to call PCP and/or come back to the hospital if there are any problems. Pt verbalized understanding.  Pt sister notified of pt discharge.  Waiting on [facility] for transport to [facility].

## 2023-07-16 NOTE — Discharge Summary (Signed)
Physician Discharge Summary  Xavier Turner RJJ:[id number] DOB: 08/01/1956 DOA: 07/08/2023  PCP: System, Provider Not In  Admit date: 07/08/2023 Discharge date: 07/16/2023  Admitted From: SNF Disposition:  SNF  Recommendations for Outpatient Follow-up:  Follow up with PCP in 1-2 weeks Please obtain BMP/CBC in one week  Home Health: none Equipment/Devices: none  Discharge Condition: stable CODE STATUS: DNR Diet Orders (From admission, onward)     Start     Ordered   07/09/23 0447  Diet Carb Modified Fluid consistency: Thin; Room service appropriate? Yes  Diet effective now       Question Answer Comment  Diet-HS Snack? Nothing   Calorie Level Medium 1600-2000   Fluid consistency: Thin   Room service appropriate? 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  He was hypotensive into the 80s-90s in the ED, there were initial concerns about him having a UTI, he was started on antibiotics and admitted to the hospital.  Hospital Course / Discharge diagnoses: Principal Problem:   Dizziness Active Problems:   Type 2 diabetes mellitus (HCC)   Hyperlipidemia associated with type 2 diabetes mellitus (HCC)   Glaucoma   UTI (urinary tract infection)   Anemia   Principal problem Hypotension -symptomatic on admission, not entirely clear as to why.  He is on olmesartan as an outpatient and it is now on hold.  Cortisol unremarkable.  This is possibly multifactorial in the setting of a UTI and potential dehydration.  He received fluids, has received antibiotics for the UTI and has been placed on midodrine with significant improvement in his blood pressure, currently feeling back to baseline  and will be discharged in stable condition.  Another possibility is that he is developing some underlying autonomic instability   Active problems UTI/pyuria-he has completed course of IV antibiotics.  May have contributed to #1 BPH-continue home medications Anxiety/depression-continue escitalopram Sturge-Weber syndrome, congenital hemiparesis, seizure disorder-stable DM2-controlled  Sepsis ruled out   Discharge Instructions   Allergies as of 07/16/2023       Reactions   Penicillin G Anaphylaxis   Not on the [facility]        Medication List     STOP taking these medications    olmesartan 5 MG tablet Commonly known as: BENICAR       TAKE these medications    acetaminophen 325 MG tablet Commonly known as: TYLENOL Take 650 mg by mouth every 6 (six) hours as needed for mild pain (pain score 1-3).   aspirin 81 MG chewable tablet Chew 81 mg by mouth daily.   brimonidine 0.2 % ophthalmic solution Commonly known as: ALPHAGAN Place 1 drop into both eyes 3 (three) times daily.   carboxymethylcellulose 0.5 % Soln Commonly known as: REFRESH PLUS Place 1 drop into both eyes every 6 (six) hours as needed (dry eyes).   dorzolamide-timolol 2-0.5 % ophthalmic solution Commonly known as: COSOPT Place 1 drop into the right eye 2 (two) times daily.   escitalopram 10 MG tablet Commonly known as: LEXAPRO Take 10 mg by mouth daily.   eucerin lotion Apply 1 Application topically every 12 (twelve) hours as needed for dry skin.   fenofibrate 48 MG tablet Commonly  Physician Discharge Summary  Xavier Turner RJJ:[id number] DOB: 08/01/1956 DOA: 07/08/2023  PCP: System, Provider Not In  Admit date: 07/08/2023 Discharge date: 07/16/2023  Admitted From: SNF Disposition:  SNF  Recommendations for Outpatient Follow-up:  Follow up with PCP in 1-2 weeks Please obtain BMP/CBC in one week  Home Health: none Equipment/Devices: none  Discharge Condition: stable CODE STATUS: DNR Diet Orders (From admission, onward)     Start     Ordered   07/09/23 0447  Diet Carb Modified Fluid consistency: Thin; Room service appropriate? Yes  Diet effective now       Question Answer Comment  Diet-HS Snack? Nothing   Calorie Level Medium 1600-2000   Fluid consistency: Thin   Room service appropriate? 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  He was hypotensive into the 80s-90s in the ED, there were initial concerns about him having a UTI, he was started on antibiotics and admitted to the hospital.  Hospital Course / Discharge diagnoses: Principal Problem:   Dizziness Active Problems:   Type 2 diabetes mellitus (HCC)   Hyperlipidemia associated with type 2 diabetes mellitus (HCC)   Glaucoma   UTI (urinary tract infection)   Anemia   Principal problem Hypotension -symptomatic on admission, not entirely clear as to why.  He is on olmesartan as an outpatient and it is now on hold.  Cortisol unremarkable.  This is possibly multifactorial in the setting of a UTI and potential dehydration.  He received fluids, has received antibiotics for the UTI and has been placed on midodrine with significant improvement in his blood pressure, currently feeling back to baseline  and will be discharged in stable condition.  Another possibility is that he is developing some underlying autonomic instability   Active problems UTI/pyuria-he has completed course of IV antibiotics.  May have contributed to #1 BPH-continue home medications Anxiety/depression-continue escitalopram Sturge-Weber syndrome, congenital hemiparesis, seizure disorder-stable DM2-controlled  Sepsis ruled out   Discharge Instructions   Allergies as of 07/16/2023       Reactions   Penicillin G Anaphylaxis   Not on the [facility]        Medication List     STOP taking these medications    olmesartan 5 MG tablet Commonly known as: BENICAR       TAKE these medications    acetaminophen 325 MG tablet Commonly known as: TYLENOL Take 650 mg by mouth every 6 (six) hours as needed for mild pain (pain score 1-3).   aspirin 81 MG chewable tablet Chew 81 mg by mouth daily.   brimonidine 0.2 % ophthalmic solution Commonly known as: ALPHAGAN Place 1 drop into both eyes 3 (three) times daily.   carboxymethylcellulose 0.5 % Soln Commonly known as: REFRESH PLUS Place 1 drop into both eyes every 6 (six) hours as needed (dry eyes).   dorzolamide-timolol 2-0.5 % ophthalmic solution Commonly known as: COSOPT Place 1 drop into the right eye 2 (two) times daily.   escitalopram 10 MG tablet Commonly known as: LEXAPRO Take 10 mg by mouth daily.   eucerin lotion Apply 1 Application topically every 12 (twelve) hours as needed for dry skin.   fenofibrate 48 MG tablet Commonly  Physician Discharge Summary  Xavier Turner RJJ:[id number] DOB: 08/01/1956 DOA: 07/08/2023  PCP: System, Provider Not In  Admit date: 07/08/2023 Discharge date: 07/16/2023  Admitted From: SNF Disposition:  SNF  Recommendations for Outpatient Follow-up:  Follow up with PCP in 1-2 weeks Please obtain BMP/CBC in one week  Home Health: none Equipment/Devices: none  Discharge Condition: stable CODE STATUS: DNR Diet Orders (From admission, onward)     Start     Ordered   07/09/23 0447  Diet Carb Modified Fluid consistency: Thin; Room service appropriate? Yes  Diet effective now       Question Answer Comment  Diet-HS Snack? Nothing   Calorie Level Medium 1600-2000   Fluid consistency: Thin   Room service appropriate? 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  He was hypotensive into the 80s-90s in the ED, there were initial concerns about him having a UTI, he was started on antibiotics and admitted to the hospital.  Hospital Course / Discharge diagnoses: Principal Problem:   Dizziness Active Problems:   Type 2 diabetes mellitus (HCC)   Hyperlipidemia associated with type 2 diabetes mellitus (HCC)   Glaucoma   UTI (urinary tract infection)   Anemia   Principal problem Hypotension -symptomatic on admission, not entirely clear as to why.  He is on olmesartan as an outpatient and it is now on hold.  Cortisol unremarkable.  This is possibly multifactorial in the setting of a UTI and potential dehydration.  He received fluids, has received antibiotics for the UTI and has been placed on midodrine with significant improvement in his blood pressure, currently feeling back to baseline  and will be discharged in stable condition.  Another possibility is that he is developing some underlying autonomic instability   Active problems UTI/pyuria-he has completed course of IV antibiotics.  May have contributed to #1 BPH-continue home medications Anxiety/depression-continue escitalopram Sturge-Weber syndrome, congenital hemiparesis, seizure disorder-stable DM2-controlled  Sepsis ruled out   Discharge Instructions   Allergies as of 07/16/2023       Reactions   Penicillin G Anaphylaxis   Not on the [facility]        Medication List     STOP taking these medications    olmesartan 5 MG tablet Commonly known as: BENICAR       TAKE these medications    acetaminophen 325 MG tablet Commonly known as: TYLENOL Take 650 mg by mouth every 6 (six) hours as needed for mild pain (pain score 1-3).   aspirin 81 MG chewable tablet Chew 81 mg by mouth daily.   brimonidine 0.2 % ophthalmic solution Commonly known as: ALPHAGAN Place 1 drop into both eyes 3 (three) times daily.   carboxymethylcellulose 0.5 % Soln Commonly known as: REFRESH PLUS Place 1 drop into both eyes every 6 (six) hours as needed (dry eyes).   dorzolamide-timolol 2-0.5 % ophthalmic solution Commonly known as: COSOPT Place 1 drop into the right eye 2 (two) times daily.   escitalopram 10 MG tablet Commonly known as: LEXAPRO Take 10 mg by mouth daily.   eucerin lotion Apply 1 Application topically every 12 (twelve) hours as needed for dry skin.   fenofibrate 48 MG tablet Commonly  Physician Discharge Summary  Xavier Turner RJJ:[id number] DOB: 08/01/1956 DOA: 07/08/2023  PCP: System, Provider Not In  Admit date: 07/08/2023 Discharge date: 07/16/2023  Admitted From: SNF Disposition:  SNF  Recommendations for Outpatient Follow-up:  Follow up with PCP in 1-2 weeks Please obtain BMP/CBC in one week  Home Health: none Equipment/Devices: none  Discharge Condition: stable CODE STATUS: DNR Diet Orders (From admission, onward)     Start     Ordered   07/09/23 0447  Diet Carb Modified Fluid consistency: Thin; Room service appropriate? Yes  Diet effective now       Question Answer Comment  Diet-HS Snack? Nothing   Calorie Level Medium 1600-2000   Fluid consistency: Thin   Room service appropriate? 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  He was hypotensive into the 80s-90s in the ED, there were initial concerns about him having a UTI, he was started on antibiotics and admitted to the hospital.  Hospital Course / Discharge diagnoses: Principal Problem:   Dizziness Active Problems:   Type 2 diabetes mellitus (HCC)   Hyperlipidemia associated with type 2 diabetes mellitus (HCC)   Glaucoma   UTI (urinary tract infection)   Anemia   Principal problem Hypotension -symptomatic on admission, not entirely clear as to why.  He is on olmesartan as an outpatient and it is now on hold.  Cortisol unremarkable.  This is possibly multifactorial in the setting of a UTI and potential dehydration.  He received fluids, has received antibiotics for the UTI and has been placed on midodrine with significant improvement in his blood pressure, currently feeling back to baseline  and will be discharged in stable condition.  Another possibility is that he is developing some underlying autonomic instability   Active problems UTI/pyuria-he has completed course of IV antibiotics.  May have contributed to #1 BPH-continue home medications Anxiety/depression-continue escitalopram Sturge-Weber syndrome, congenital hemiparesis, seizure disorder-stable DM2-controlled  Sepsis ruled out   Discharge Instructions   Allergies as of 07/16/2023       Reactions   Penicillin G Anaphylaxis   Not on the [facility]        Medication List     STOP taking these medications    olmesartan 5 MG tablet Commonly known as: BENICAR       TAKE these medications    acetaminophen 325 MG tablet Commonly known as: TYLENOL Take 650 mg by mouth every 6 (six) hours as needed for mild pain (pain score 1-3).   aspirin 81 MG chewable tablet Chew 81 mg by mouth daily.   brimonidine 0.2 % ophthalmic solution Commonly known as: ALPHAGAN Place 1 drop into both eyes 3 (three) times daily.   carboxymethylcellulose 0.5 % Soln Commonly known as: REFRESH PLUS Place 1 drop into both eyes every 6 (six) hours as needed (dry eyes).   dorzolamide-timolol 2-0.5 % ophthalmic solution Commonly known as: COSOPT Place 1 drop into the right eye 2 (two) times daily.   escitalopram 10 MG tablet Commonly known as: LEXAPRO Take 10 mg by mouth daily.   eucerin lotion Apply 1 Application topically every 12 (twelve) hours as needed for dry skin.   fenofibrate 48 MG tablet Commonly  known as: TRICOR Take 48 mg by mouth daily.   gabapentin 100 MG capsule Commonly known as: NEURONTIN Take 1 capsule (100 mg total) by mouth 2 (two) times daily. What changed: when to take this   hydrocortisone 25 MG suppository Commonly known as: ANUSOL-HC Place 25 mg rectally every 8 (eight) hours as needed for hemorrhoids.   Jardiance 10 MG Tabs tablet Generic drug: empagliflozin Take 10 mg by mouth daily.    Menthol (Topical Analgesic) 5 % Gel Apply 1 application  topically 2 (two) times daily. Apply to bilateral knees topically twice a day   metFORMIN 1000 MG tablet Commonly known as: GLUCOPHAGE Take 1 tablet (1,000 mg total) by mouth 2 (two) times daily with a meal.   midodrine 5 MG tablet Commonly known as: PROAMATINE Take 1 tablet (5 mg total) by mouth 3 (three) times daily with meals.   polyethylene glycol 17 g packet Commonly known as: MIRALAX / GLYCOLAX Take 17 g by mouth daily. Mix in 8 ounces of water.   Rocklatan 0.02-0.005 % Soln Generic drug: Netarsudil-Latanoprost Place 1 drop into both eyes at bedtime.   rosuvastatin 40 MG tablet Commonly known as: CRESTOR Take 40 mg by mouth at bedtime.   sitaGLIPtin 100 MG tablet Commonly known as: JANUVIA Take 1 tablet (100 mg total) by mouth daily.   tamsulosin 0.4 MG Caps capsule Commonly known as: FLOMAX Take 1 capsule (0.4 mg total) by mouth daily.        Contact information for after-discharge care     Destination     [facility] .   Service: Skilled Nursing Contact information: [address] [place] [address] [phone]                     Consultations: none  Procedures/Studies:  ECHOCARDIOGRAM COMPLETE  Result Date: 07/14/2023    ECHOCARDIOGRAM REPORT   Patient Name:   Xavier Turner Date of Exam: 07/14/2023 Medical Rec #:  [id number]     Height:       65.0 in Accession #:    [id number]    Weight:       193.3 lb Date of Birth:  1955-10-12     BSA:          1.950 m Patient Age:    67 years      BP:           121/61 mmHg Patient Gender: M             HR:           60 bpm. Exam Location:  Inpatient Procedure: 2D Echo, Color Doppler and Cardiac Doppler Indications:    R55 Syncope  History:        Patient has prior history of Echocardiogram examinations. Risk                 Factors:Diabetes, Dyslipidemia and Sleep Apnea.  Sonographer:    Irving Burton Senior RDCS Referring Phys:  [id number] SROBONA TUBLU CHATTERJEE IMPRESSIONS  1. Left ventricular ejection fraction, by estimation, is 65 to 70%. The left ventricle has normal function. The left ventricle has no regional wall motion abnormalities. Left ventricular diastolic parameters are indeterminate.  2. Right ventricular systolic function is normal. The right ventricular size is normal.  3. Left atrial size was mildly dilated.  4. The mitral valve is normal in structure. No evidence of mitral valve regurgitation. No evidence of mitral stenosis.  5. The aortic valve  known as: TRICOR Take 48 mg by mouth daily.   gabapentin 100 MG capsule Commonly known as: NEURONTIN Take 1 capsule (100 mg total) by mouth 2 (two) times daily. What changed: when to take this   hydrocortisone 25 MG suppository Commonly known as: ANUSOL-HC Place 25 mg rectally every 8 (eight) hours as needed for hemorrhoids.   Jardiance 10 MG Tabs tablet Generic drug: empagliflozin Take 10 mg by mouth daily.    Menthol (Topical Analgesic) 5 % Gel Apply 1 application  topically 2 (two) times daily. Apply to bilateral knees topically twice a day   metFORMIN 1000 MG tablet Commonly known as: GLUCOPHAGE Take 1 tablet (1,000 mg total) by mouth 2 (two) times daily with a meal.   midodrine 5 MG tablet Commonly known as: PROAMATINE Take 1 tablet (5 mg total) by mouth 3 (three) times daily with meals.   polyethylene glycol 17 g packet Commonly known as: MIRALAX / GLYCOLAX Take 17 g by mouth daily. Mix in 8 ounces of water.   Rocklatan 0.02-0.005 % Soln Generic drug: Netarsudil-Latanoprost Place 1 drop into both eyes at bedtime.   rosuvastatin 40 MG tablet Commonly known as: CRESTOR Take 40 mg by mouth at bedtime.   sitaGLIPtin 100 MG tablet Commonly known as: JANUVIA Take 1 tablet (100 mg total) by mouth daily.   tamsulosin 0.4 MG Caps capsule Commonly known as: FLOMAX Take 1 capsule (0.4 mg total) by mouth daily.        Contact information for after-discharge care     Destination     [facility] .   Service: Skilled Nursing Contact information: [address] [place] [address] [phone]                     Consultations: none  Procedures/Studies:  ECHOCARDIOGRAM COMPLETE  Result Date: 07/14/2023    ECHOCARDIOGRAM REPORT   Patient Name:   Xavier Turner Date of Exam: 07/14/2023 Medical Rec #:  [id number]     Height:       65.0 in Accession #:    [id number]    Weight:       193.3 lb Date of Birth:  1955-10-12     BSA:          1.950 m Patient Age:    67 years      BP:           121/61 mmHg Patient Gender: M             HR:           60 bpm. Exam Location:  Inpatient Procedure: 2D Echo, Color Doppler and Cardiac Doppler Indications:    R55 Syncope  History:        Patient has prior history of Echocardiogram examinations. Risk                 Factors:Diabetes, Dyslipidemia and Sleep Apnea.  Sonographer:    Irving Burton Senior RDCS Referring Phys:  [id number] SROBONA TUBLU CHATTERJEE IMPRESSIONS  1. Left ventricular ejection fraction, by estimation, is 65 to 70%. The left ventricle has normal function. The left ventricle has no regional wall motion abnormalities. Left ventricular diastolic parameters are indeterminate.  2. Right ventricular systolic function is normal. The right ventricular size is normal.  3. Left atrial size was mildly dilated.  4. The mitral valve is normal in structure. No evidence of mitral valve regurgitation. No evidence of mitral stenosis.  5. The aortic valve  Physician Discharge Summary  Xavier Turner RJJ:[id number] DOB: 08/01/1956 DOA: 07/08/2023  PCP: System, Provider Not In  Admit date: 07/08/2023 Discharge date: 07/16/2023  Admitted From: SNF Disposition:  SNF  Recommendations for Outpatient Follow-up:  Follow up with PCP in 1-2 weeks Please obtain BMP/CBC in one week  Home Health: none Equipment/Devices: none  Discharge Condition: stable CODE STATUS: DNR Diet Orders (From admission, onward)     Start     Ordered   07/09/23 0447  Diet Carb Modified Fluid consistency: Thin; Room service appropriate? Yes  Diet effective now       Question Answer Comment  Diet-HS Snack? Nothing   Calorie Level Medium 1600-2000   Fluid consistency: Thin   Room service appropriate? 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  He was hypotensive into the 80s-90s in the ED, there were initial concerns about him having a UTI, he was started on antibiotics and admitted to the hospital.  Hospital Course / Discharge diagnoses: Principal Problem:   Dizziness Active Problems:   Type 2 diabetes mellitus (HCC)   Hyperlipidemia associated with type 2 diabetes mellitus (HCC)   Glaucoma   UTI (urinary tract infection)   Anemia   Principal problem Hypotension -symptomatic on admission, not entirely clear as to why.  He is on olmesartan as an outpatient and it is now on hold.  Cortisol unremarkable.  This is possibly multifactorial in the setting of a UTI and potential dehydration.  He received fluids, has received antibiotics for the UTI and has been placed on midodrine with significant improvement in his blood pressure, currently feeling back to baseline  and will be discharged in stable condition.  Another possibility is that he is developing some underlying autonomic instability   Active problems UTI/pyuria-he has completed course of IV antibiotics.  May have contributed to #1 BPH-continue home medications Anxiety/depression-continue escitalopram Sturge-Weber syndrome, congenital hemiparesis, seizure disorder-stable DM2-controlled  Sepsis ruled out   Discharge Instructions   Allergies as of 07/16/2023       Reactions   Penicillin G Anaphylaxis   Not on the [facility]        Medication List     STOP taking these medications    olmesartan 5 MG tablet Commonly known as: BENICAR       TAKE these medications    acetaminophen 325 MG tablet Commonly known as: TYLENOL Take 650 mg by mouth every 6 (six) hours as needed for mild pain (pain score 1-3).   aspirin 81 MG chewable tablet Chew 81 mg by mouth daily.   brimonidine 0.2 % ophthalmic solution Commonly known as: ALPHAGAN Place 1 drop into both eyes 3 (three) times daily.   carboxymethylcellulose 0.5 % Soln Commonly known as: REFRESH PLUS Place 1 drop into both eyes every 6 (six) hours as needed (dry eyes).   dorzolamide-timolol 2-0.5 % ophthalmic solution Commonly known as: COSOPT Place 1 drop into the right eye 2 (two) times daily.   escitalopram 10 MG tablet Commonly known as: LEXAPRO Take 10 mg by mouth daily.   eucerin lotion Apply 1 Application topically every 12 (twelve) hours as needed for dry skin.   fenofibrate 48 MG tablet Commonly

## 2023-07-16 NOTE — Progress Notes (Signed)
Patient eating dinner, waiting for EMS to transfer for discharge. No concerns at this time.

## 2023-07-16 NOTE — Care Management Important Message (Signed)
Important Message  Patient Details  Name: Xavier Turner MRN: [id number] Date of Birth: 06-29-1956   Important Message Given:  Yes - Medicare IM     [name] 07/16/2023, 2:35 PM

## 2023-07-16 NOTE — Plan of Care (Signed)
Problem: Education: Goal: Ability to describe self-care measures that may prevent or decrease complications (Diabetes Survival Skills Education) will improve 07/16/2023 1156 by Mahika Vanvoorhis, Quitman Livings, RN Outcome: Adequate for Discharge 07/16/2023 0757 by Beryle Lathe, RN Outcome: Progressing Goal: Individualized Educational Video(s) 07/16/2023 1156 by Beryle Lathe, RN Outcome: Adequate for Discharge 07/16/2023 0757 by Beryle Lathe, RN Outcome: Progressing   Problem: Coping: Goal: Ability to adjust to condition or change in health will improve 07/16/2023 1156 by Marveline Profeta, Quitman Livings, RN Outcome: Adequate for Discharge 07/16/2023 0757 by Beryle Lathe, RN Outcome: Progressing   Problem: Fluid Volume: Goal: Ability to maintain a balanced intake and output will improve 07/16/2023 1156 by Sharyon Peitz, Quitman Livings, RN Outcome: Adequate for Discharge 07/16/2023 0757 by Beryle Lathe, RN Outcome: Progressing   Problem: Health Behavior/Discharge Planning: Goal: Ability to identify and utilize available resources and services will improve 07/16/2023 1156 by Erron Wengert, Quitman Livings, RN Outcome: Adequate for Discharge 07/16/2023 0757 by Beryle Lathe, RN Outcome: Progressing Goal: Ability to manage health-related needs will improve 07/16/2023 1156 by Omer Puccinelli, Quitman Livings, RN Outcome: Adequate for Discharge 07/16/2023 0757 by Beryle Lathe, RN Outcome: Progressing   Problem: Metabolic: Goal: Ability to maintain appropriate glucose levels will improve 07/16/2023 1156 by Vandy Tsuchiya, Quitman Livings, RN Outcome: Adequate for Discharge 07/16/2023 0757 by Beryle Lathe, RN Outcome: Progressing   Problem: Nutritional: Goal: Maintenance of adequate nutrition will improve 07/16/2023 1156 by Desiree Fleming, Quitman Livings, RN Outcome: Adequate for Discharge 07/16/2023 0757 by Beryle Lathe, RN Outcome: Progressing Goal: Progress toward achieving an optimal weight will  improve 07/16/2023 1156 by Daemon Dowty, Quitman Livings, RN Outcome: Adequate for Discharge 07/16/2023 0757 by Beryle Lathe, RN Outcome: Progressing   Problem: Skin Integrity: Goal: Risk for impaired skin integrity will decrease 07/16/2023 1156 by Sarajean Dessert, Quitman Livings, RN Outcome: Adequate for Discharge 07/16/2023 0757 by Beryle Lathe, RN Outcome: Progressing   Problem: Tissue Perfusion: Goal: Adequacy of tissue perfusion will improve 07/16/2023 1156 by Marshayla Mitschke, Quitman Livings, RN Outcome: Adequate for Discharge 07/16/2023 0757 by Beryle Lathe, RN Outcome: Progressing   Problem: Education: Goal: Knowledge of General Education information will improve Description: Including pain rating scale, medication(s)/side effects and non-pharmacologic comfort measures 07/16/2023 1156 by Lalla Laham, Quitman Livings, RN Outcome: Adequate for Discharge 07/16/2023 0757 by Beryle Lathe, RN Outcome: Progressing   Problem: Health Behavior/Discharge Planning: Goal: Ability to manage health-related needs will improve 07/16/2023 1156 by Elda Dunkerson, Quitman Livings, RN Outcome: Adequate for Discharge 07/16/2023 0757 by Beryle Lathe, RN Outcome: Progressing   Problem: Clinical Measurements: Goal: Ability to maintain clinical measurements within normal limits will improve 07/16/2023 1156 by Zell Hylton, Quitman Livings, RN Outcome: Adequate for Discharge 07/16/2023 0757 by Beryle Lathe, RN Outcome: Progressing Goal: Will remain free from infection 07/16/2023 1156 by Byrd Terrero, Quitman Livings, RN Outcome: Adequate for Discharge 07/16/2023 0757 by Beryle Lathe, RN Outcome: Progressing Goal: Diagnostic test results will improve 07/16/2023 1156 by Gaelen Brager, Quitman Livings, RN Outcome: Adequate for Discharge 07/16/2023 0757 by Beryle Lathe, RN Outcome: Progressing Goal: Respiratory complications will improve 07/16/2023 1156 by Marianna Cid, Quitman Livings, RN Outcome: Adequate for Discharge 07/16/2023 0757 by  Beryle Lathe, RN Outcome: Progressing Goal: Cardiovascular complication will be avoided 07/16/2023 1156 by Nakeesha Bowler, Quitman Livings, RN Outcome: Adequate for Discharge 07/16/2023 0757 by Beryle Lathe, RN Outcome: Progressing   Problem: Activity: Goal: Risk for activity intolerance will decrease 07/16/2023 1156 by Akera Snowberger, Quitman Livings, RN Outcome: Adequate for Discharge  07/16/2023 0757 by Beryle Lathe, RN Outcome: Progressing   Problem: Nutrition: Goal: Adequate nutrition will be maintained 07/16/2023 1156 by Clemma Johnsen, Quitman Livings, RN Outcome: Adequate for Discharge 07/16/2023 0757 by Beryle Lathe, RN Outcome: Progressing   Problem: Coping: Goal: Level of anxiety will decrease 07/16/2023 1156 by Blaze Nylund, Quitman Livings, RN Outcome: Adequate for Discharge 07/16/2023 0757 by Beryle Lathe, RN Outcome: Progressing   Problem: Elimination: Goal: Will not experience complications related to bowel motility 07/16/2023 1156 by Itzel Lowrimore, Quitman Livings, RN Outcome: Adequate for Discharge 07/16/2023 0757 by Beryle Lathe, RN Outcome: Progressing Goal: Will not experience complications related to urinary retention 07/16/2023 1156 by Luisfelipe Engelstad, Quitman Livings, RN Outcome: Adequate for Discharge 07/16/2023 0757 by Beryle Lathe, RN Outcome: Progressing   Problem: Pain Managment: Goal: General experience of comfort will improve 07/16/2023 1156 by Lindi Abram, Quitman Livings, RN Outcome: Adequate for Discharge 07/16/2023 0757 by Beryle Lathe, RN Outcome: Progressing   Problem: Safety: Goal: Ability to remain free from injury will improve 07/16/2023 1156 by Burtis Imhoff, Quitman Livings, RN Outcome: Adequate for Discharge 07/16/2023 0757 by Beryle Lathe, RN Outcome: Progressing   Problem: Skin Integrity: Goal: Risk for impaired skin integrity will decrease 07/16/2023 1156 by Billee Balcerzak, Quitman Livings, RN Outcome: Adequate for Discharge 07/16/2023 0757 by Beryle Lathe,  RN Outcome: Progressing

## 2023-07-16 NOTE — Progress Notes (Signed)
   07/15/23 2324  BiPAP/CPAP/SIPAP  BiPAP/CPAP/SIPAP Pt Type Adult  Reason BIPAP/CPAP not in use Non-compliant

## 2023-07-16 NOTE — TOC Transition Note (Signed)
Transition of Care [facility]) - CM/SW Discharge Note   Patient Details  Name: Xavier Turner MRN: [id number] Date of Birth: [birth year]  [facility] of [facility]) CM/SW Contact:  Deatra Robinson, [place] Phone Number: 07/16/2023, 12:53 PM   Clinical Narrative: pt for dc back to [facility] where he is a LTC resident. Spoke to [place] DON at [facility] who confirmed they are prepared to admit pt back to room 204A. Pt's sister/[name] aware of dc and reports agreeable. Sister requesting medical update, MD made aware. RN provided with number for report and PTAR arranged for transport. SW signing off at dc.   Dellie Burns, MSW, LCSW [phone] (coverage)        Final next level of care: Skilled Nursing Facility Barriers to Discharge: Barriers Resolved   Patient Goals and CMS Choice      Discharge Placement                Patient chooses bed at: [facility] Patient to be transferred to facility by: PTAR Name of family member notified: [name]/Sister Patient and family notified of of transfer: 07/16/23  Discharge Plan and Services Additional resources added to the After Visit Summary for       Post Acute Care Choice: Skilled Nursing Facility                               Social Determinants of Health (SDOH) Interventions SDOH Screenings   Food Insecurity: No Food Insecurity (10/25/2021)   Received from [facility], [facility]  Transportation Needs: No Transportation Needs (10/25/2021)   Received from [facility], [facility]  Financial Resource Strain: Low Risk  (10/25/2021)   Received from [facility], [facility]  Physical Activity: Unknown (10/25/2021)   Received from [facility], [facility]  Recent Concern: Physical Activity - Inactive (10/25/2021)   Received from [facility]  Social Connections: Unknown (01/21/2022)   Received from [facility], [facility]  Recent Concern: Social Connections - Socially Isolated (10/25/2021)   Received from  [name], [facility]  Stress: Stress Concern Present (10/25/2021)   Received from [facility], [facility]  Tobacco Use: Low Risk  (07/08/2023)     Readmission Risk Interventions     No data to display

## 2023-07-16 NOTE — Telephone Encounter (Signed)
Received message patient currently in hospital and will need to cancel upcoming procedure 10/25 with Dr. Marletta Lor.   Called Belinda at [place] and rescheduled to 12/2. Aware will fax new instructions/pre-op to her at [phone]

## 2023-07-16 NOTE — Progress Notes (Signed)
Pt stated he had large BM yesterday, 10/21.  Pt refuse miralax.  In addition, pt refuse morning insulin.  Education provided.  Pt does not want to be "stuck by a needle" - will continue to provide education.

## 2023-07-16 NOTE — Plan of Care (Signed)

## 2023-07-16 NOTE — Care Management Important Message (Signed)
Important Message  Patient Details  Name: Xavier Turner MRN: [id number] Date of Birth: [birth year]   Important Message Given:  Yes - Medicare IM     [name] 07/16/2023, 3:03 PM

## 2023-07-16 NOTE — Plan of Care (Signed)
  Problem: Coping: Goal: Ability to adjust to condition or change in health will improve Outcome: Progressing   Problem: Fluid Volume: Goal: Ability to maintain a balanced intake and output will improve Outcome: Progressing   Problem: Metabolic: Goal: Ability to maintain appropriate glucose levels will improve Outcome: Progressing   

## 2023-07-17 ENCOUNTER — Encounter: Payer: Self-pay | Admitting: *Deleted

## 2023-07-17 ENCOUNTER — Encounter (HOSPITAL_COMMUNITY)
Admission: RE | Admit: 2023-07-17 | Discharge: 2023-07-17 | Disposition: A | Discharge status: Home / Self Care | Payer: Medicare Other | Source: Ambulatory Visit | Attending: Internal Medicine | Admitting: Internal Medicine

## 2023-08-15 ENCOUNTER — Ambulatory Visit (INDEPENDENT_AMBULATORY_CARE_PROVIDER_SITE_OTHER): Payer: Medicare Other | Admitting: Podiatry

## 2023-08-15 ENCOUNTER — Encounter: Payer: Self-pay | Admitting: Podiatry

## 2023-08-15 DIAGNOSIS — M79674 Pain in right toe(s): Secondary | ICD-10-CM | POA: Diagnosis not present

## 2023-08-15 DIAGNOSIS — E1169 Type 2 diabetes mellitus with other specified complication: Secondary | ICD-10-CM | POA: Diagnosis not present

## 2023-08-15 DIAGNOSIS — B351 Tinea unguium: Secondary | ICD-10-CM

## 2023-08-15 DIAGNOSIS — M79675 Pain in left toe(s): Secondary | ICD-10-CM | POA: Diagnosis not present

## 2023-08-15 DIAGNOSIS — L84 Corns and callosities: Secondary | ICD-10-CM

## 2023-08-15 DIAGNOSIS — E785 Hyperlipidemia, unspecified: Secondary | ICD-10-CM | POA: Diagnosis not present

## 2023-08-15 NOTE — Progress Notes (Signed)
  Subjective:  Patient ID: Xavier Turner, male    DOB: 1956-06-05,  MRN: [id number]  Chief Complaint  Patient presents with   Diabetes    Patient is here for RFC    67 y.o. male returns with the above complaint. History confirmed with patient.  Here today for at risk diabetic foot care his nails are thickened elongated and causing pain again.  Callus has returned and is painful as well.  No other new complaints  Objective:  Physical Exam: warm, good capillary refill, no trophic changes or ulcerative lesions, normal DP and PT pulses, and abnormal sensory exam.  He has severe pes cavus foot deformity.  Prominent area of tenderness over the fifth metatarsal base plantarly.  Hyperkeratotic painful callus here.  Thickened elongated nails with yellow discoloration and subungual debris of all remaining toenails  Summary:  RIGHT:  - No evidence of common femoral vein obstruction.     LEFT:  - There is no evidence of deep vein thrombosis in the lower extremity.  - There is no evidence of superficial venous thrombosis.     - No cystic structure found in the popliteal fossa.      *See table(s) above for measurements and observations.   Electronically signed by Gerarda Fraction on 10/06/2021 at 4:20:40 PM.   Lab work was unrevealing for gout or infectious process Assessment:   1. Pain due to onychomycosis of toenails of both feet   2. Callus of foot   3. Type 2 diabetes mellitus with hyperlipidemia (HCC)         Plan:  Patient was evaluated and treated and all questions answered.     Discussed the etiology and treatment options for the condition in detail with the patient.  Recommended debridement of the nails today. Sharp and mechanical debridement performed of all painful and mycotic nails today. Nails debrided in length and thickness using a nail nipper to level of comfort. Discussed treatment options including appropriate shoe gear. Follow up as needed for painful nails.  All  symptomatic hyperkeratoses were safely debrided with a sterile #15 blade to patient's level of comfort without incident. We discussed preventative and palliative care of these lesions including supportive and accommodative shoegear, padding, prefabricated and custom molded accommodative orthoses, use of a pumice stone and lotions/creams daily.  Return in about 4 months (around 12/13/2023).

## 2023-08-20 ENCOUNTER — Encounter (HOSPITAL_COMMUNITY)
Admit: 2023-08-20 | Discharge: 2023-08-20 | Disposition: A | Discharge status: Home / Self Care | Payer: Medicare Other | Attending: Internal Medicine | Admitting: Internal Medicine

## 2023-08-20 NOTE — Patient Instructions (Signed)
Xavier Turner  08/20/2023     @PREFPERIOPPHARMACY @   Your procedure is scheduled on 08/26/2023.   Report to [facility] at 8:00 A.M.   Call this number if you have problems the morning of surgery:  [phone]  If you experience any cold or flu symptoms such as cough, fever, chills, shortness of breath, etc. between now and your scheduled surgery, please notify us at the above number.   Remember:   Please Follow the diet and prep instructions given to you by Dr [facility] [place] office.    Take these medicines the morning of surgery with A SIP OF WATER : Escitalopram Gabapentin and Tamsulosin    Do not wear jewelry, make-up or nail polish, including gel polish,  artificial nails, or any other type of covering on natural nails (fingers and  toes).  Do not wear lotions, powders, or perfumes, or deodorant.  Do not shave 48 hours prior to surgery.  Men may shave face and neck.  Do not bring valuables to the hospital.  [facility] is not responsible for any belongings or valuables.  Contacts, dentures or bridgework may not be worn into surgery.  Leave your suitcase in the car.  After surgery it may be brought to your room.  For patients admitted to the hospital, discharge time will be determined by your treatment team.  Patients discharged the day of surgery will not be allowed to drive home.   Name and phone number of your driver:   [facility] Special instructions:  N/A  Please read over the following fact sheets that you were given. Care and Recovery After Surgery  Colonoscopy, Adult A colonoscopy is a procedure to look at the entire large intestine. This procedure is done using a long, thin, flexible tube that has a camera on the end. You may have a colonoscopy: As a part of normal colorectal screening. If you have certain symptoms, such as: A low number of red blood cells in your blood (anemia). Diarrhea that does not go away. Pain in your abdomen. Blood in your stool. A  colonoscopy can help screen for and diagnose medical problems, including: An abnormal growth of cells or tissue (tumor). Abnormal growths within the lining of your intestine (polyps). Inflammation. Areas of bleeding. Tell your health care provider about: Any allergies you have. All medicines you are taking, including vitamins, herbs, eye drops, creams, and over-the-counter medicines. Any problems you or family members have had with anesthetic medicines. Any bleeding problems you have. Any surgeries you have had. Any medical conditions you have. Any problems you have had with having bowel movements. Whether you are pregnant or may be pregnant. What are the risks? Generally, this is a safe procedure. However, problems may occur, including: Bleeding. Damage to your intestine. Allergic reactions to medicines given during the procedure. Infection. This is rare. What happens before the procedure? Eating and drinking restrictions Follow instructions from your health care provider about eating or drinking restrictions, which may include: A few days before the procedure: Follow a low-fiber diet. Avoid nuts, seeds, dried fruit, raw fruits, and vegetables. 1-3 days before the procedure: Eat only gelatin dessert or ice pops. Drink only clear liquids, such as water, clear juice, clear broth or bouillon, black coffee or tea, or clear soft drinks or sports drinks. Avoid liquids that contain red or purple dye. The day of the procedure: Do not eat solid foods. You may continue to drink clear liquids until up to 2 hours before the  procedure. Do not eat or drink anything starting 2 hours before the procedure, or within the time period that your health care provider recommends. Bowel prep If you were prescribed a bowel prep to take by mouth (orally) to clean out your colon: Take it as told by your health care provider. Starting the day before your procedure, you will need to drink a large amount of  liquid medicine. The liquid will cause you to have many bowel movements of loose stool until your stool becomes almost clear or light green. If your skin or the opening between the buttocks (anus) gets irritated from diarrhea, you may relieve the irritation using: Wipes with medicine in them, such as adult wet wipes with aloe and vitamin E. A product to soothe skin, such as petroleum jelly. If you vomit while drinking the bowel prep: Take a break for up to 60 minutes. Begin the bowel prep again. Call your health care provider if you keep vomiting or you cannot take the bowel prep without vomiting. To clean out your colon, you may also be given: Laxative medicines. These help you have a bowel movement. Instructions for enema use. An enema is liquid medicine injected into your rectum. Medicines Ask your health care provider about: Changing or stopping your regular medicines or supplements. This is especially important if you are taking iron supplements, diabetes medicines, or blood thinners. Taking medicines such as aspirin and ibuprofen. These medicines can thin your blood. Do not take these medicines unless your health care provider tells you to take them. Taking over-the-counter medicines, vitamins, herbs, and supplements. General instructions Ask your health care provider what steps will be taken to help prevent infection. These may include washing skin with a germ-killing soap. If you will be going home right after the procedure, plan to have a responsible adult: Take you home from the hospital or clinic. You will not be allowed to drive. Care for you for the time you are told. What happens during the procedure?  An IV will be inserted into one of your veins. You will be given a medicine to make you fall asleep (general anesthetic). You will lie on your side with your knees bent. A lubricant will be put on the tube. Then the tube will be: Inserted into your anus. Gently eased through  all parts of your large intestine. Air will be sent into your colon to keep it open. This may cause some pressure or cramping. Images will be taken with the camera and will appear on a screen. A small tissue sample may be removed to be looked at under a microscope (biopsy). The tissue may be sent to a lab for testing if any signs of problems are found. If small polyps are found, they may be removed and checked for cancer cells. When the procedure is finished, the tube will be removed. The procedure may vary among health care providers and hospitals. What happens after the procedure? Your blood pressure, heart rate, breathing rate, and blood oxygen level will be monitored until you leave the hospital or clinic. You may have a small amount of blood in your stool. You may pass gas and have mild cramping or bloating in your abdomen. This is caused by the air that was used to open your colon during the exam. If you were given a sedative during the procedure, it can affect you for several hours. Do not drive or operate machinery until your health care provider says that it is safe. It is  up to you to get the results of your procedure. Ask your health care provider, or the department that is doing the procedure, when your results will be ready. Summary A colonoscopy is a procedure to look at the entire large intestine. Follow instructions from your health care provider about eating and drinking before the procedure. If you were prescribed an oral bowel prep to clean out your colon, take it as told by your health care provider. During the colonoscopy, a flexible tube with a camera on its end is inserted into the anus and then passed into all parts of the large intestine. This information is not intended to replace advice given to you by your health care provider. Make sure you discuss any questions you have with your health care provider. Document Revised: 10/23/2022 Document Reviewed:  05/03/2021 Elsevier Patient Education  2024 [employer]  Monitored Anesthesia Care, Care After The following information offers guidance on how to care for yourself after your procedure. Your health care provider may also give you more specific instructions. If you have problems or questions, contact your health care provider. What can I expect after the procedure? After the procedure, it is common to have: Tiredness. Little or no memory about what happened during or after the procedure. Impaired judgment when it comes to making decisions. Nausea or vomiting. Some trouble with balance. Follow these instructions at home: For the time period you were told by your health care provider:  Rest. Do not participate in activities where you could fall or become injured. Do not drive or use machinery. Do not drink alcohol. Do not take sleeping pills or medicines that cause drowsiness. Do not make important decisions or sign legal documents. Do not take care of children on your own. Medicines Take over-the-counter and prescription medicines only as told by your health care provider. If you were prescribed antibiotics, take them as told by your health care provider. Do not stop using the antibiotic even if you start to feel better. Eating and drinking Follow instructions from your health care provider about what you may eat and drink. Drink enough fluid to keep your urine pale yellow. If you vomit: Drink clear fluids slowly and in small amounts as you are able. Clear fluids include water, ice chips, low-calorie sports drinks, and fruit juice that has water added to it (diluted fruit juice). Eat light and bland foods in small amounts as you are able. These foods include bananas, applesauce, rice, lean meats, toast, and crackers. General instructions  Have a responsible adult stay with you for the time you are told. It is important to have someone help care for you until you are awake and  alert. If you have sleep apnea, surgery and some medicines can increase your risk for breathing problems. Follow instructions from your health care provider about wearing your sleep device: When you are sleeping. This includes during daytime naps. While taking prescription pain medicines, sleeping medicines, or medicines that make you drowsy. Do not use any products that contain nicotine or tobacco. These products include cigarettes, chewing tobacco, and vaping devices, such as e-cigarettes. If you need help quitting, ask your health care provider. Contact a health care provider if: You feel nauseous or vomit every time you eat or drink. You feel light-headed. You are still sleepy or having trouble with balance after 24 hours. You get a rash. You have a fever. You have redness or swelling around the IV site. Get help right away if: You have trouble  breathing. You have new confusion after you get home. These symptoms may be an emergency. Get help right away. Call 911. Do not wait to see if the symptoms will go away. Do not drive yourself to the hospital. This information is not intended to replace advice given to you by your health care provider. Make sure you discuss any questions you have with your health care provider. Document Revised: 02/05/2022 Document Reviewed: 02/05/2022 Elsevier Patient Education  2024 [employer].

## 2023-08-21 ENCOUNTER — Encounter (HOSPITAL_COMMUNITY): Payer: Self-pay

## 2023-08-26 ENCOUNTER — Ambulatory Visit (HOSPITAL_COMMUNITY): Payer: Medicare Other | Admitting: Anesthesiology

## 2023-08-26 ENCOUNTER — Ambulatory Visit (HOSPITAL_COMMUNITY)
Admission: RE | Admit: 2023-08-26 | Discharge: 2023-08-26 | Disposition: A | Discharge status: Skilled Nursing Facility | Payer: Medicare Other | Source: Ambulatory Visit | Attending: Internal Medicine | Admitting: Internal Medicine

## 2023-08-26 ENCOUNTER — Encounter (HOSPITAL_COMMUNITY): Payer: Self-pay

## 2023-08-26 ENCOUNTER — Encounter (HOSPITAL_COMMUNITY)
Admission: RE | Disposition: A | Discharge status: Skilled Nursing Facility | Payer: Self-pay | Source: Ambulatory Visit | Attending: Internal Medicine

## 2023-08-26 DIAGNOSIS — K635 Polyp of colon: Secondary | ICD-10-CM | POA: Diagnosis not present

## 2023-08-26 DIAGNOSIS — Z1211 Encounter for screening for malignant neoplasm of colon: Secondary | ICD-10-CM | POA: Insufficient documentation

## 2023-08-26 DIAGNOSIS — E119 Type 2 diabetes mellitus without complications: Secondary | ICD-10-CM

## 2023-08-26 DIAGNOSIS — N189 Chronic kidney disease, unspecified: Secondary | ICD-10-CM | POA: Diagnosis not present

## 2023-08-26 DIAGNOSIS — Q8589 Other phakomatoses, not elsewhere classified: Secondary | ICD-10-CM | POA: Insufficient documentation

## 2023-08-26 DIAGNOSIS — K648 Other hemorrhoids: Secondary | ICD-10-CM | POA: Diagnosis not present

## 2023-08-26 DIAGNOSIS — Z7984 Long term (current) use of oral hypoglycemic drugs: Secondary | ICD-10-CM | POA: Insufficient documentation

## 2023-08-26 DIAGNOSIS — E1122 Type 2 diabetes mellitus with diabetic chronic kidney disease: Secondary | ICD-10-CM | POA: Diagnosis not present

## 2023-08-26 DIAGNOSIS — I129 Hypertensive chronic kidney disease with stage 1 through stage 4 chronic kidney disease, or unspecified chronic kidney disease: Secondary | ICD-10-CM | POA: Diagnosis not present

## 2023-08-26 DIAGNOSIS — K621 Rectal polyp: Secondary | ICD-10-CM | POA: Insufficient documentation

## 2023-08-26 HISTORY — PX: COLONOSCOPY WITH PROPOFOL: SHX5780

## 2023-08-26 HISTORY — PX: POLYPECTOMY: SHX5525

## 2023-08-26 LAB — GLUCOSE, CAPILLARY: Glucose-Capillary: 109 mg/dL — ABNORMAL HIGH (ref 70–99)

## 2023-08-26 SURGERY — COLONOSCOPY WITH PROPOFOL
Anesthesia: General

## 2023-08-26 MED ORDER — STERILE WATER FOR IRRIGATION IR SOLN
Status: DC | PRN
  Administered 2023-08-26 (×2): 60 mL

## 2023-08-26 MED ORDER — LACTATED RINGERS IV SOLN: INTRAVENOUS | Status: DC | PRN

## 2023-08-26 MED ORDER — PROPOFOL 10 MG/ML IV BOLUS
INTRAVENOUS | Status: DC | PRN
  Administered 2023-08-26: 30 mg via INTRAVENOUS
  Administered 2023-08-26 (×2): 50 mg via INTRAVENOUS
  Administered 2023-08-26 (×2): 20 mg via INTRAVENOUS
  Administered 2023-08-26: 30 mg via INTRAVENOUS
  Administered 2023-08-26: 50 mg via INTRAVENOUS

## 2023-08-26 NOTE — Discharge Instructions (Addendum)
  Colonoscopy Discharge Instructions  Read the instructions outlined below and refer to this sheet in the next few weeks. These discharge instructions provide you with general information on caring for yourself after you leave the hospital. Your doctor may also give you specific instructions. While your treatment has been planned according to the most current medical practices available, unavoidable complications occasionally occur.   ACTIVITY You may resume your regular activity, but move at a slower pace for the next 24 hours.  Take frequent rest periods for the next 24 hours.  Walking will help get rid of the air and reduce the bloated feeling in your belly (abdomen).  No driving for 24 hours (because of the medicine (anesthesia) used during the test).   Do not sign any important legal documents or operate any machinery for 24 hours (because of the anesthesia used during the test).  NUTRITION Drink plenty of fluids.  You may resume your normal diet as instructed by your doctor.  Begin with a light meal and progress to your normal diet. Heavy or fried foods are harder to digest and may make you feel sick to your stomach (nauseated).  Avoid alcoholic beverages for 24 hours or as instructed.  MEDICATIONS You may resume your normal medications unless your doctor tells you otherwise.  WHAT YOU CAN EXPECT TODAY Some feelings of bloating in the abdomen.  Passage of more gas than usual.  Spotting of blood in your stool or on the toilet paper.  IF YOU HAD POLYPS REMOVED DURING THE COLONOSCOPY: No aspirin products for 7 days or as instructed.  No alcohol for 7 days or as instructed.  Eat a soft diet for the next 24 hours.  FINDING OUT THE RESULTS OF YOUR TEST Not all test results are available during your visit. If your test results are not back during the visit, make an appointment with your caregiver to find out the results. Do not assume everything is normal if you have not heard from your  caregiver or the medical facility. It is important for you to follow up on all of your test results.  SEEK IMMEDIATE MEDICAL ATTENTION IF: You have more than a spotting of blood in your stool.  Your belly is swollen (abdominal distention).  You are nauseated or vomiting.  You have a temperature over 101.  You have abdominal pain or discomfort that is severe or gets worse throughout the day.   Your colonoscopy revealed 2 polyp(s) which I removed successfully. Await pathology results, my office will contact you. I recommend repeating colonoscopy in 5 years for surveillance purposes.   Follow up in GI office in 3 months  I hope you have a great rest of your week!  Hennie Duos. Marletta Lor, D.O. Gastroenterology and Hepatology [facility]

## 2023-08-26 NOTE — Anesthesia Postprocedure Evaluation (Signed)
Anesthesia Post Note  Patient: Xavier Turner  Procedure(s) Performed: COLONOSCOPY WITH PROPOFOL POLYPECTOMY  Patient location during evaluation: PACU Anesthesia Type: General Level of consciousness: awake and alert Pain management: pain level controlled Vital Signs Assessment: post-procedure vital signs reviewed and stable Respiratory status: spontaneous breathing, nonlabored ventilation, respiratory function stable and patient connected to nasal cannula oxygen Cardiovascular status: blood pressure returned to baseline and stable Postop Assessment: no apparent nausea or vomiting Anesthetic complications: no   There were no known notable events for this encounter.   Last Vitals:  Vitals:   08/26/23 0840 08/26/23 1004  BP: 122/69 137/64  Pulse: 64 77  Resp: 18 14  Temp: 36.6 C 36.4 C  SpO2: 98% 97%    Last Pain:  Vitals:   08/26/23 1004  TempSrc: Oral  PainSc: 0-No pain                 Aseel Truxillo L Emrik Erhard

## 2023-08-26 NOTE — Anesthesia Preprocedure Evaluation (Addendum)
Anesthesia Evaluation  Patient identified by MRN, date of birth, ID band Patient awake    Reviewed: Allergy & Precautions, H&P , NPO status , Patient's Chart, lab work & pertinent test results  Airway Mallampati: III  TM Distance: >3 FB Neck ROM: Full    Dental  (+) Dental Advisory Given, Missing, Poor Dentition All upper front missing and many others too:   Pulmonary asthma , sleep apnea    Pulmonary exam normal breath sounds clear to auscultation       Cardiovascular Exercise Tolerance: Poor hypertension, Pt. on medications Normal cardiovascular exam Rhythm:Regular Rate:Normal     Neuro/Psych Seizures -, Well Controlled,  PSYCHIATRIC DISORDERS  Depression    Craniotomy . Hemiparesis. glaucoma  Neuromuscular disease    GI/Hepatic Neg liver ROS,GERD  Medicated,,  Endo/Other  diabetes, Well Controlled, Type 2, Oral Hypoglycemic Agents    Renal/GU Renal hypertensionRenal disease  negative genitourinary   Musculoskeletal negative musculoskeletal ROS (+)    Abdominal   Peds negative pediatric ROS (+)  Hematology  (+) Blood dyscrasia, anemia   Anesthesia Other Findings Sturge- weber syndrome  Reproductive/Obstetrics negative OB ROS                             Anesthesia Physical Anesthesia Plan  ASA: 3  Anesthesia Plan: General   Post-op Pain Management: Minimal or no pain anticipated   Induction: Intravenous  PONV Risk Score and Plan: 1 and Propofol infusion  Airway Management Planned: Nasal Cannula, Natural Airway and Simple Face Mask  Additional Equipment: None  Intra-op Plan:   Post-operative Plan:   Informed Consent: I have reviewed the patients History and Physical, chart, labs and discussed the procedure including the risks, benefits and alternatives for the proposed anesthesia with the patient or authorized representative who has indicated his/her understanding and  acceptance.     Dental advisory given  Plan Discussed with: CRNA  Anesthesia Plan Comments: (Discussed with patient regarding blood product refusal, refused all blood products, agreed to get albumin iv as a life saving measure.)       Anesthesia Quick Evaluation

## 2023-08-26 NOTE — Transfer of Care (Signed)
Immediate Anesthesia Transfer of Care Note  Patient: Xavier Turner  Procedure(s) Performed: COLONOSCOPY WITH PROPOFOL POLYPECTOMY  Patient Location: Short Stay  Anesthesia Type:General  Level of Consciousness: awake  Airway & Oxygen Therapy: Patient Spontanous Breathing  Post-op Assessment: Report given to RN and Post -op Vital signs reviewed and stable  Post vital signs: Reviewed and stable  Last Vitals:  Vitals Value Taken Time  BP 137/64 08/26/23 1004  Temp 36.4 C 08/26/23 1004  Pulse 77 08/26/23 1004  Resp 14 08/26/23 1004  SpO2 97 % 08/26/23 1004    Last Pain:  Vitals:   08/26/23 1004  TempSrc: Oral  PainSc: 0-No pain         Complications: No notable events documented.

## 2023-08-26 NOTE — Op Note (Signed)
[facility] Patient Name: Xavier Turner Procedure Date: 08/26/2023 8:05 AM MRN: [id number] Date of Birth: 03/15/1956 Attending MD: Hennie Duos. Marletta Lor , [place], [id number] CSN: [id number] Age: 67 Admit Type: Outpatient Procedure:                Colonoscopy Indications:              Screening for colorectal malignant neoplasm Providers:                Hennie Duos. Marletta Lor, DO, Angelica Ran, Kristine L.                            Jessee Avers, Technician Referring MD:              Medicines:                See the Anesthesia note for documentation of the                            administered medications Complications:            No immediate complications. Estimated Blood Loss:     Estimated blood loss was minimal. Procedure:                Pre-Anesthesia Assessment:                           - The anesthesia plan was to use monitored                            anesthesia care (MAC).                           After obtaining informed consent, the colonoscope                            was passed under direct vision. Throughout the                            procedure, the patient's blood pressure, pulse, and                            oxygen saturations were monitored continuously. The                            PCF-HQ190L ([id number]) scope was introduced through                            the anus and advanced to the the cecum, identified                            by appendiceal orifice and ileocecal valve. The                            colonoscopy was performed without difficulty. The                            patient tolerated the procedure  well. The quality                            of the bowel preparation was evaluated using the                            BBPS [facility] Bowel Preparation Scale) with scores                            of: Right Colon = 2 (minor amount of residual                            staining, small fragments of stool and/or opaque                            liquid,  but mucosa seen well), Transverse Colon = 2                            (minor amount of residual staining, small fragments                            of stool and/or opaque liquid, but mucosa seen                            well) and Left Colon = 2 (minor amount of residual                            staining, small fragments of stool and/or opaque                            liquid, but mucosa seen well). The total BBPS score                            equals 6. The quality of the bowel preparation was                            good. Scope In: 9:29:13 AM Scope Out: 9:58:12 AM Scope Withdrawal Time: 0 hours 22 minutes 4 seconds  Total Procedure Duration: 0 hours 28 minutes 59 seconds  Findings:      A 4 mm polyp was found in the transverse colon. The polyp was sessile.       The polyp was removed with a cold snare. Resection and retrieval were       complete.      A 6 mm polyp was found in the rectum. The polyp was sessile. The polyp       was removed with a cold snare. Resection and retrieval were complete.      Semi-liquid stool was found in the entire colon, making visualization       difficult. Lavage of the area was performed using copious amounts of       sterile water, resulting in clearance with good visualization.      Non-bleeding internal hemorrhoids were found during endoscopy. Impression:               -  One 4 mm polyp in the transverse colon, removed                            with a cold snare. Resected and retrieved.                           - One 6 mm polyp in the rectum, removed with a cold                            snare. Resected and retrieved.                           - Stool in the entire examined colon.                           - Non-bleeding internal hemorrhoids. Moderate Sedation:      Per Anesthesia Care Recommendation:           - Patient has a contact number available for                            emergencies. The signs and symptoms of potential                             delayed complications were discussed with the                            patient. Return to normal activities tomorrow.                            Written discharge instructions were provided to the                            patient.                           - Resume previous diet.                           - Continue present medications.                           - Await pathology results.                           - Repeat colonoscopy in 5 years for surveillance.                           - Return to GI clinic in 3 months. Procedure Code(s):        --- Professional ---                           [phone], Colonoscopy, flexible; with removal of                            tumor(s), polyp(s), or other  lesion(s) by snare                            technique Diagnosis Code(s):        --- Professional ---                           Z12.11, Encounter for screening for malignant                            neoplasm of colon                           D12.3, Benign neoplasm of transverse colon (hepatic                            flexure or splenic flexure)                           D12.8, Benign neoplasm of rectum CPT copyright 2022 American Medical Association. All rights reserved. The codes documented in this report are preliminary and upon coder review may  be revised to meet current compliance requirements. Hennie Duos. Marletta Lor, DO Hennie Duos. Marletta Lor, DO 08/26/2023 10:01:46 AM This report has been signed electronically. Number of Addenda: 0

## 2023-08-26 NOTE — H&P (Addendum)
Primary Care Physician:  System, Provider Not In Primary Gastroenterologist:  Dr. Marletta Lor  Pre-Procedure History & Physical: HPI:  Xavier Turner is a 67 y.o. male is here for a colonoscopy for colon cancer screening purposes.    Past Medical History:  Diagnosis Date   Anemia    Asthma    Chronic kidney disease    Diabetes (HCC)    Glaucoma    Hemiparesis (HCC)    Hypercholesterolemia    Hyperlipemia    Melanoma (HCC)    Melanoma (HCC)    Obesity    OSA (obstructive sleep apnea)    Seizure (HCC)    Seizure disorder (HCC)    Sturge syndrome (HCC)    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HCC)     Past Surgical History:  Procedure Laterality Date   AMPUTATION Left 07/24/2019   Procedure: AMPUTATION RAY, fifth left;  Surgeon: Nadara Mustard, MD;  Location: [facility] OR;  Service: Orthopedics;  Laterality: Left;   COLONOSCOPY     CRANIOTOMY  1999   FLEXIBLE SIGMOIDOSCOPY  04/08/2023   Procedure: FLEXIBLE SIGMOIDOSCOPY;  Surgeon: Lanelle Bal, DO;  Location: [facility];  Service: Endoscopy;;   melanoma removal  2011    Prior to Admission medications   Medication Sig Start Date End Date Taking? Authorizing Provider  gabapentin (NEURONTIN) 100 MG capsule Take 1 capsule (100 mg total) by mouth 2 (two) times daily. Patient taking differently: Take 100 mg by mouth 3 (three) times daily. 08/28/19  Yes Margit Hanks, MD  acetaminophen (TYLENOL) 325 MG tablet Take 650 mg by mouth every 6 (six) hours as needed for mild pain (pain score 1-3).    [provider]  aspirin 81 MG chewable tablet Chew 81 mg by mouth daily.    [provider]  brimonidine (ALPHAGAN) 0.2 % ophthalmic solution Place 1 drop into both eyes 3 (three) times daily. 08/28/19   Margit Hanks, MD  carboxymethylcellulose (REFRESH PLUS) 0.5 % SOLN Place 1 drop into both eyes every 6 (six) hours as needed (dry eyes).    [provider]  dorzolamide-timolol (COSOPT) 22.3-6.8 MG/ML ophthalmic solution Place 1  drop into the right eye 2 (two) times daily. 08/28/19   Margit Hanks, MD  Emollient (EUCERIN) lotion Apply 1 Application topically every 12 (twelve) hours as needed for dry skin.    [provider]  escitalopram (LEXAPRO) 10 MG tablet Take 10 mg by mouth daily. 12/20/21   [provider]  fenofibrate (TRICOR) 48 MG tablet Take 48 mg by mouth daily.    [provider]  hydrocortisone (ANUSOL-HC) 25 MG suppository Place 25 mg rectally every 8 (eight) hours as needed for hemorrhoids.    [provider]  JARDIANCE 10 MG TABS tablet Take 10 mg by mouth daily. 04/03/21   [provider]  Menthol, Topical Analgesic, 5 % GEL Apply 1 application  topically 2 (two) times daily. Apply to bilateral knees topically twice a day    [provider]  metFORMIN (GLUCOPHAGE) 1000 MG tablet Take 1 tablet (1,000 mg total) by mouth 2 (two) times daily with a meal. 08/28/19   Margit Hanks, MD  midodrine (PROAMATINE) 5 MG tablet Take 1 tablet (5 mg total) by mouth 3 (three) times daily with meals. 07/16/23   Leatha Gilding, MD  polyethylene glycol (MIRALAX / GLYCOLAX) 17 g packet Take 17 g by mouth daily. Mix in 8 ounces of water. 06/06/23   Aida Raider, NP  ROCKLATAN 0.02-0.005 % SOLN Place 1 drop into both eyes at bedtime. 08/28/19   Margit Hanks, MD  rosuvastatin (CRESTOR) 40 MG tablet Take 40 mg by mouth at bedtime. 03/14/23   [provider]  sitaGLIPtin (JANUVIA) 100 MG tablet Take 1 tablet (100 mg total) by mouth daily. 08/28/19   Margit Hanks, MD  tamsulosin (FLOMAX) 0.4 MG CAPS capsule Take 1 capsule (0.4 mg total) by mouth daily. 08/28/19   Margit Hanks, MD    Allergies as of 06/13/2023 - Review Complete 06/06/2023  Allergen Reaction Noted   Penicillin g Anaphylaxis 03/06/2023    Family History  Problem Relation Age of Onset   Emphysema Father    Cancer Father    Asthma Mother     Social History   Socioeconomic  History   Marital status: Single    Spouse name: Not on file   Number of children: Not on file   Years of education: Not on file   Highest education level: Not on file  Occupational History   Occupation: [employer]    Comment: resides in a group home([facility])  Tobacco Use   Smoking status: Never   Smokeless tobacco: Never  Vaping Use   Vaping status: Never Used  Substance and Sexual Activity   Alcohol use: Not Currently   Drug use: Not Currently   Sexual activity: Not Currently  Other Topics Concern   Not on file  Social History Narrative   Single.  Employed by [employer].  Never smoker. Has legal guardian.    Social Determinants of Health   Financial Resource Strain: Low Risk  (10/25/2021)   Received from [facility], [facility]   Overall Financial Resource Strain (CARDIA)    Difficulty of Paying Living Expenses: Not hard at all  Food Insecurity: No Food Insecurity (10/25/2021)   Received from [facility], [facility]   Hunger Vital Sign    Worried About Running Out of Food in the Last Year: Never true    Ran Out of Food in the Last Year: Never true  Transportation Needs: No Transportation Needs (10/25/2021)   Received from [facility], [facility]   PRAPARE - Transportation    Lack of Transportation (Medical): No    Lack of Transportation (Non-Medical): No  Physical Activity: Unknown (10/25/2021)   Received from [facility], [facility]   Exercise Vital Sign    Days of Exercise per Week: 0 days    Minutes of Exercise per Session: Not on file  Recent Concern: Physical Activity - Inactive (10/25/2021)   Received from [facility]   Exercise Vital Sign    Days of Exercise per Week: 0 days    Minutes of Exercise per Session: 0 min  Stress: Stress Concern Present (10/25/2021)   Received from [facility] - Occupational Stress Questionnaire    Feeling of Stress : To some extent  Social  Connections: Unknown (01/21/2022)   Received from [facility], [facility]   Social Network    Social Network: Not on file  Recent Concern: Social Connections - Socially Isolated (10/25/2021)   Received from [facility], [facility]   Social Connection and Isolation Panel [NHANES]    Frequency of Communication with Friends and Family: Never    Frequency of Social Gatherings with Friends and Family: Never    Attends Religious Services: Never    Database administrator or Organizations: Yes  Attends Banker Meetings: Never    Marital Status: Never married  Intimate Partner Violence: Unknown (12/27/2021)   Received from [facility], [facility]   HITS    Physically Hurt: Not on file    Insult or Talk Down To: Not on file    Threaten Physical Harm: Not on file    Scream or Curse: Not on file    Review of Systems: See HPI, otherwise negative ROS  Physical Exam: Vital signs in last 24 hours:     General:   Alert,  Well-developed, well-nourished, pleasant and cooperative in NAD Head:  Normocephalic and atraumatic. Eyes:  Sclera clear, no icterus.   Conjunctiva pink. Ears:  Normal auditory acuity. Nose:  No deformity, discharge,  or lesions. Msk:  Symmetrical without gross deformities. Normal posture. Extremities:  Without clubbing or edema. Neurologic:  Alert and  oriented x4;  grossly normal neurologically. Skin:  Intact without significant lesions or rashes. Psych:  Alert and cooperative. Normal mood and affect.  Impression/Plan: Xavier Turner is here for a colonoscopy to be performed for colon cancer screening purposes.  The risks of the procedure including infection, bleed, or perforation as well as benefits, limitations, alternatives and imponderables have been reviewed with the patient. Questions have been answered. All parties agreeable.

## 2023-08-27 LAB — SURGICAL PATHOLOGY

## 2023-09-02 ENCOUNTER — Encounter (HOSPITAL_COMMUNITY): Payer: Self-pay | Admitting: Internal Medicine

## 2023-09-05 ENCOUNTER — Ambulatory Visit: Payer: Medicare Other | Admitting: Gastroenterology

## 2023-11-25 ENCOUNTER — Ambulatory Visit: Payer: Medicare Other | Admitting: Gastroenterology

## 2023-12-16 ENCOUNTER — Ambulatory Visit (INDEPENDENT_AMBULATORY_CARE_PROVIDER_SITE_OTHER): Payer: Medicare Other | Admitting: Podiatry

## 2023-12-16 ENCOUNTER — Encounter: Payer: Self-pay | Admitting: Podiatry

## 2023-12-16 DIAGNOSIS — B351 Tinea unguium: Secondary | ICD-10-CM | POA: Diagnosis not present

## 2023-12-16 DIAGNOSIS — M79675 Pain in left toe(s): Secondary | ICD-10-CM

## 2023-12-16 DIAGNOSIS — L84 Corns and callosities: Secondary | ICD-10-CM | POA: Diagnosis not present

## 2023-12-16 DIAGNOSIS — E1169 Type 2 diabetes mellitus with other specified complication: Secondary | ICD-10-CM

## 2023-12-16 DIAGNOSIS — M79674 Pain in right toe(s): Secondary | ICD-10-CM | POA: Diagnosis not present

## 2023-12-16 DIAGNOSIS — E785 Hyperlipidemia, unspecified: Secondary | ICD-10-CM

## 2023-12-16 NOTE — Progress Notes (Signed)
  Subjective:  Patient ID: Xavier Turner, Xavier Turner    DOB: [birth year],  MRN: [id number]  Chief Complaint  Patient presents with   Nail Problem    Patient states that he is here for [facility] and that he has a bump at the bottom of his left foot that has been painful patient states.     68 y.o. Xavier Turner returns with the above complaint. History confirmed with patient.  Here today for at risk diabetic foot care his nails are thickened elongated and causing pain again.  Callus has returned and is painful as well into different sites on the outside of the left foot.  No other medical issues  Objective:  Physical Exam: warm, good capillary refill, no trophic changes or ulcerative lesions, normal DP and PT pulses, and abnormal sensory exam.  He has severe pes cavus foot deformity.  Prominent area of tenderness over the fifth metatarsal base plantarly.  Hyperkeratotic painful callus here.  Additional painful hyperkeratotic callus on the fourth metatarsal head thickened elongated nails with yellow discoloration and subungual debris of all remaining toenails   Assessment:   No diagnosis found.       Plan:  Patient was evaluated and treated and all questions answered.     Discussed the etiology and treatment options for the condition in detail with the patient.  Recommended debridement of the nails today. Sharp and mechanical debridement performed of all painful and mycotic nails today. Nails debrided in length and thickness using a nail nipper to level of comfort. Discussed treatment options including appropriate shoe gear. Follow up as needed for painful nails.  All symptomatic hyperkeratoses were safely debrided with a sterile #15 blade to patient's level of comfort without incident. We discussed preventative and palliative care of these lesions including supportive and accommodative shoegear, padding, prefabricated and custom molded accommodative orthoses, use of a pumice stone and lotions/creams  daily.  Return in about 4 months (around 04/16/2024) for at risk diabetic foot care.

## 2023-12-25 NOTE — Progress Notes (Unsigned)
 GI Office Note    Referring Provider: No ref. provider found Primary Care Physician:  System, Provider Not In Primary Gastroenterologist: Hennie Duos. Marletta Lor, DO  Date:  12/26/2023  ID:  Xavier Turner, DOB 06-Mar-1956, MRN [id number]   Chief Complaint   Chief Complaint  Patient presents with   Follow-up    Follow up. No problems    History of Present Illness  Xavier Turner is a 68 y.o. male with a history of diabetes, HLD, PVD, anemia of chronic disease, CKD, glaucoma, depression, OSA, GERD, Sturge-Weber syndrome presenting today for follow-up post colonoscopy.  Per review of chart it appears he had a colonoscopy in 2006 with Dr. Leone Payor which was normal.  About an hour and a half after procedure he developed some abdominal discomfort and had some air trapping for which scope was reinserted and colon Decompressed.  He was recommended to have a colonoscopy in 10 years given he has a second-degree relative with colon cancer.   Appears that he was seen with a provider in the [facility] system in 2013 and was advised to schedule a colonoscopy as he had an occult positive stool at that time.  It does not appear that he had a colonoscopy after this office visit unless it is not documented or available in Care Everywhere.   Echocardiogram in January 2024 with EF 60 to 65%.   Seen for initial consultation 03/06/2023.  Patient reported a couple episodes of BRBPR.  Had a normal-looking stool followed by a looser stool with blood present both times.  With some subsequent other bowel movements he has had a little bit of blood as well and some mild abdominal pain.  He reported a good appetite along with some mild rectal discomfort.  He denies any chest pain, shortness of breath, fatigue, or dizziness.  He had reported some history of rectal bleeding in his family as well.  Review of his chart indicated that he was recommended to have a colonoscopy in 2013 but does not appear that this was performed.  Rectal  exam in the office did not reveal any mass.  FOBT in the office was positive.  Patient was advised to schedule colonoscopy and use Preparation H twice daily for 1 week.   Colonoscopy 04/08/2023: -Patient had an inadequate prep as there was stool in the rectum and sigmoid colon  Last office visit 06/06/23.  Reported rectal bleeding that morning with bowel movement.  Unable to tolerate taste of colonoscopy prep, given that he will most treatment 4 times he quit drinking.  Continuing to have rectal discomfort but denies abdominal pain.  Reportingly feeling like a fingernail and a scrape to his rectum, pain with bowel movements.  Having issues with bowel movements on a more regular basis despite daily bowel movement.  Spoke with patient's guardian who agreed with reattempt at colonoscopy.  Rescheduled.  Ordered compound hemorrhoid cream with lidocaine and nifedipine to treat for anal fissure.  Advised to start MiraLAX daily along with Colace.  Colonoscopy was ultimately canceled given patient was hospitalized and then rescheduled for December.  Colonoscopy December 2024: -4 mm sessile polyp in the transverse colon -6 mm sessile polyp in the rectum -Stool in entire examined colon -Nonbleeding internal hemorrhoids -Pathology revealed hyperplastic polyps -Repeat colonoscopy in 5 years  Today:  Sometimes he does have some constipation. Does frequent hygiene after Bms. Denies abdominal pain. Appetite is very good. He states BM are every other day but sometimes can go a week without  a BM. Feels pretty good overall.   Denise chest pain or shortness of breath.   Last rectal bleeding episode was about 2 months ago. Possible after a big BM.   Wt Readings from Last 3 Encounters:  12/26/23 194 lb (88 kg)  07/16/23 198 lb 13.7 oz (90.2 kg)  06/06/23 199 lb (90.3 kg)    Current Outpatient Medications  Medication Sig Dispense Refill   acetaminophen (TYLENOL) 325 MG tablet Take 650 mg by mouth every 6  (six) hours as needed for mild pain (pain score 1-3).     aspirin 81 MG chewable tablet Chew 81 mg by mouth daily.     brimonidine (ALPHAGAN) 0.2 % ophthalmic solution Place 1 drop into both eyes 3 (three) times daily. 5 mL 0   carboxymethylcellulose (REFRESH PLUS) 0.5 % SOLN Place 1 drop into both eyes every 6 (six) hours as needed (dry eyes).     dorzolamide-timolol (COSOPT) 22.3-6.8 MG/ML ophthalmic solution Place 1 drop into the right eye 2 (two) times daily. 10 mL 0   Emollient (EUCERIN) lotion Apply 1 Application topically every 12 (twelve) hours as needed for dry skin.     escitalopram (LEXAPRO) 10 MG tablet Take 10 mg by mouth daily.     fenofibrate (TRICOR) 48 MG tablet Take 48 mg by mouth daily.     gabapentin (NEURONTIN) 100 MG capsule Take 1 capsule (100 mg total) by mouth 2 (two) times daily. (Patient taking differently: Take 100 mg by mouth 3 (three) times daily.) 60 capsule 0   hydrocortisone (ANUSOL-HC) 25 MG suppository Place 25 mg rectally every 8 (eight) hours as needed for hemorrhoids.     JARDIANCE 10 MG TABS tablet Take 10 mg by mouth daily.     Menthol, Topical Analgesic, 5 % GEL Apply 1 application  topically 2 (two) times daily. Apply to bilateral knees topically twice a day     metFORMIN (GLUCOPHAGE) 1000 MG tablet Take 1 tablet (1,000 mg total) by mouth 2 (two) times daily with a meal. 60 tablet 0   midodrine (PROAMATINE) 5 MG tablet Take 1 tablet (5 mg total) by mouth 3 (three) times daily with meals.     polyethylene glycol (MIRALAX / GLYCOLAX) 17 g packet Take 17 g by mouth daily. Mix in 8 ounces of water. 14 each 0   ROCKLATAN 0.02-0.005 % SOLN Place 1 drop into both eyes at bedtime. 2.5 mL 0   rosuvastatin (CRESTOR) 40 MG tablet Take 40 mg by mouth at bedtime.     sitaGLIPtin (JANUVIA) 100 MG tablet Take 1 tablet (100 mg total) by mouth daily. 30 tablet 0   tamsulosin (FLOMAX) 0.4 MG CAPS capsule Take 1 capsule (0.4 mg total) by mouth daily. 30 capsule 0   No  current facility-administered medications for this visit.    Past Medical History:  Diagnosis Date   Anemia    Asthma    Chronic kidney disease    Diabetes (HCC)    Glaucoma    Hemiparesis (HCC)    Hypercholesterolemia    Hyperlipemia    Melanoma (HCC)    Melanoma (HCC)    Obesity    OSA (obstructive sleep apnea)    Seizure (HCC)    Seizure disorder (HCC)    Sturge syndrome (HCC)    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HCC)     Past Surgical History:  Procedure Laterality Date   AMPUTATION Left 07/24/2019   Procedure: AMPUTATION RAY, fifth left;  Surgeon: Nadara Mustard, MD;  Location:  [facility] OR;  Service: Orthopedics;  Laterality: Left;   COLONOSCOPY     COLONOSCOPY WITH PROPOFOL N/A 08/26/2023   Procedure: COLONOSCOPY WITH PROPOFOL;  Surgeon: Lanelle Bal, DO;  Location: [facility];  Service: Endoscopy;  Laterality: N/A;  12:30 pm, asa 3   CRANIOTOMY  1999   FLEXIBLE SIGMOIDOSCOPY  04/08/2023   Procedure: FLEXIBLE SIGMOIDOSCOPY;  Surgeon: Lanelle Bal, DO;  Location: [facility];  Service: Endoscopy;;   melanoma removal  2011   POLYPECTOMY  08/26/2023   Procedure: POLYPECTOMY;  Surgeon: Lanelle Bal, DO;  Location: [facility];  Service: Endoscopy;;    Family History  Problem Relation Age of Onset   Emphysema Father    Cancer Father    Asthma Mother     Allergies as of 12/26/2023 - Review Complete 12/26/2023  Allergen Reaction Noted   Penicillin g Anaphylaxis 03/06/2023    Social History   Socioeconomic History   Marital status: Single    Spouse name: Not on file   Number of children: Not on file   Years of education: Not on file   Highest education level: Not on file  Occupational History   Occupation: [employer]    Comment: resides in a group home([facility])  Tobacco Use   Smoking status: Never   Smokeless tobacco: Never  Vaping Use   Vaping status: Never Used  Substance and Sexual Activity   Alcohol use: Not Currently   Drug use: Not  Currently   Sexual activity: Not Currently  Other Topics Concern   Not on file  Social History Narrative   Single.  Employed by [employer].  Never smoker. Has legal guardian.    Social Drivers of Corporate investment banker Strain: Low Risk  (10/25/2021)   Received from [facility], [facility]   Overall Financial Resource Strain (CARDIA)    Difficulty of Paying Living Expenses: Not hard at all  Food Insecurity: No Food Insecurity (10/25/2021)   Received from [facility], [facility]   Hunger Vital Sign    Worried About Running Out of Food in the Last Year: Never true    Ran Out of Food in the Last Year: Never true  Transportation Needs: No Transportation Needs (10/25/2021)   Received from [facility], [facility]   PRAPARE - Transportation    Lack of Transportation (Medical): No    Lack of Transportation (Non-Medical): No  Physical Activity: Unknown (10/25/2021)   Received from [facility], [facility]   Exercise Vital Sign    Days of Exercise per Week: 0 days    Minutes of Exercise per Session: Not on file  Recent Concern: Physical Activity - Inactive (10/25/2021)   Received from [facility]   Exercise Vital Sign    Days of Exercise per Week: 0 days    Minutes of Exercise per Session: 0 min  Stress: Stress Concern Present (10/25/2021)   Received from [facility], [facility] - Occupational Stress Questionnaire    Feeling of Stress : To some extent  Social Connections: Unknown (01/21/2022)   Received from [facility], [facility]   Social Network    Social Network: Not on file  Recent Concern: Social Connections - Socially Isolated (10/25/2021)   Received from [facility], [facility]   Social Connection and Isolation Panel [NHANES]    Frequency of Communication with Friends and Family: Never    Frequency of Social Gatherings with Friends  and Family: Never    Attends Religious Services: Never    Active  Member of Clubs or Organizations: Yes    Attends Banker Meetings: Never    Marital Status: Never married     Review of Systems   Gen: Denies fever, chills, anorexia. Denies fatigue, weakness, weight loss.  CV: Denies chest pain, palpitations, syncope, peripheral edema, and claudication. Resp: Denies dyspnea at rest, cough, wheezing, coughing up blood, and pleurisy. GI: See HPI Derm: Denies rash, itching, dry skin Psych: Denies depression, anxiety, memory loss, confusion. No homicidal or suicidal ideation.  Heme: Denies bruising, bleeding, and enlarged lymph nodes.  Physical Exam   BP 106/72 (BP Location: Right Arm, Patient Position: Sitting, Cuff Size: Normal)   Pulse 74   Temp 98.2 F (36.8 C) (Temporal)   Ht 5\' 5"  (1.651 m)   Wt 194 lb (88 kg)   BMI 32.28 kg/m   General:   Alert and oriented. No distress noted. Pleasant and cooperative.  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 Eyes:  Conjuctiva clear without scleral icterus. Abdomen:  +BS, soft, non-distended, rounded.  Mild TTP to epigastrium.  No rebound or guarding. No HSM or masses noted. Rectal: deferred Msk:  Symmetrical without gross deformities.  Patient sitting in wheelchair for visit.  Self transferred to restroom during office visit. Neurologic:  Alert and  oriented x4 Psych:  Alert and cooperative. Normal mood and affect.  Assessment  Xavier Turner is a 68 y.o. male with a history of diabetes, HLD, PVD, anemia of chronic disease, CKD, glaucoma, depression, OSA, GERD, Sturge-Weber syndrome presenting today for follow-up post colonoscopy.  Normocytic anemia: -Hemoglobin stable at 9.6 October during hospitalization, was normal in March 2024.  Since July and 2024 his hemoglobin has been in the upper 9-low 11 range -Could be secondary to chronic kidney disease. -No EGD performed -Recent colonoscopy performed due to rectal bleeding, found to have  hemorrhoids and 2 polyps removed. -Continues to have intermittent rectal bleeding -Will reassess hemoglobin and iron levels today and if needed will schedule EGD for further evaluation of anemia.  Constipation, rectal bleeding: -Rectal bleeding likely secondary to hemorrhoids as noted on recent colonoscopy given intermittent nature of symptoms -BMs sometimes every other day, sometimes can go a week without a bowel movement. -Patient does not believe he is taking MiraLAX on a regular basis, however per his med list is on there is daily, will increase to twice daily to help with constipation.  Advised facility to track bowel movements if possible. -Can give Dulcolax as needed if no bowel movement within 1 week. -Facility to notify us if he does not more frequently with increasing MiraLAX -Continue hemorrhoid cream as needed.   Colon polyps: -Colonoscopy in 2006 was normal -Most recent colonoscopy in December with 2 tubular adenomas removed, due for surveillance in 5 years.  PLAN   CBC, iron panel Increase MiraLAX 17 g (1 capful) to twice daily Will titrate MiraLAX as needed if diarrhea occurs Dulcolax as needed Advised to try to avoid straining Continue hemorrhoid cream as needed for episodes of rectal bleeding Follow up in 4 months.    Brooke Bonito, MSN, FNP-BC, AGACNP-BC [facility]

## 2023-12-26 ENCOUNTER — Encounter: Payer: Self-pay | Admitting: Gastroenterology

## 2023-12-26 ENCOUNTER — Ambulatory Visit: Payer: Medicare Other | Admitting: Gastroenterology

## 2023-12-26 VITALS — BP 106/72 | HR 74 | Temp 98.2°F | Ht 65.0 in | Wt 194.0 lb

## 2023-12-26 DIAGNOSIS — D631 Anemia in chronic kidney disease: Secondary | ICD-10-CM

## 2023-12-26 DIAGNOSIS — K5900 Constipation, unspecified: Secondary | ICD-10-CM | POA: Diagnosis not present

## 2023-12-26 DIAGNOSIS — N189 Chronic kidney disease, unspecified: Secondary | ICD-10-CM

## 2023-12-26 DIAGNOSIS — K625 Hemorrhage of anus and rectum: Secondary | ICD-10-CM | POA: Diagnosis not present

## 2023-12-26 NOTE — Patient Instructions (Addendum)
 Increase MiraLAX to 1 capful twice daily in 8 ounces of water.  Please attempt to track bowel movements, if he does not have more than 3-4 bowel movements per week then please call the office.  We can try prescription management if no improvement with MiraLAX.  Can give Dulcolax 5 mg as needed if no bowel movement within 1 week.  Continue hydrocortisone rectal cream as needed for hemorrhoids and when rectal bleeding occurs.  Follow-up in 4 months.  It was a pleasure to see you today. I want to create trusting relationships with patients. If you receive a survey regarding your visit,  I greatly appreciate you taking time to fill this out on paper or through your MyChart. I value your feedback.  Brooke Bonito, MSN, FNP-BC, AGACNP-BC [facility]

## 2024-01-12 ENCOUNTER — Emergency Department (HOSPITAL_COMMUNITY)

## 2024-01-12 ENCOUNTER — Other Ambulatory Visit: Payer: Self-pay

## 2024-01-12 ENCOUNTER — Encounter (HOSPITAL_COMMUNITY): Payer: Self-pay

## 2024-01-12 ENCOUNTER — Emergency Department (HOSPITAL_COMMUNITY)
Admission: EM | Admit: 2024-01-12 | Discharge: 2024-01-13 | Disposition: A | Discharge status: Home / Self Care | Attending: Emergency Medicine | Admitting: Emergency Medicine

## 2024-01-12 DIAGNOSIS — E119 Type 2 diabetes mellitus without complications: Secondary | ICD-10-CM | POA: Diagnosis not present

## 2024-01-12 DIAGNOSIS — E871 Hypo-osmolality and hyponatremia: Secondary | ICD-10-CM | POA: Insufficient documentation

## 2024-01-12 DIAGNOSIS — I129 Hypertensive chronic kidney disease with stage 1 through stage 4 chronic kidney disease, or unspecified chronic kidney disease: Secondary | ICD-10-CM | POA: Diagnosis not present

## 2024-01-12 DIAGNOSIS — Z7982 Long term (current) use of aspirin: Secondary | ICD-10-CM | POA: Insufficient documentation

## 2024-01-12 DIAGNOSIS — Z7984 Long term (current) use of oral hypoglycemic drugs: Secondary | ICD-10-CM | POA: Diagnosis not present

## 2024-01-12 DIAGNOSIS — N189 Chronic kidney disease, unspecified: Secondary | ICD-10-CM | POA: Insufficient documentation

## 2024-01-12 DIAGNOSIS — R531 Weakness: Secondary | ICD-10-CM | POA: Diagnosis not present

## 2024-01-12 DIAGNOSIS — N3000 Acute cystitis without hematuria: Secondary | ICD-10-CM | POA: Diagnosis not present

## 2024-01-12 DIAGNOSIS — R600 Localized edema: Secondary | ICD-10-CM | POA: Diagnosis not present

## 2024-01-12 DIAGNOSIS — R109 Unspecified abdominal pain: Secondary | ICD-10-CM | POA: Diagnosis present

## 2024-01-12 LAB — COMPREHENSIVE METABOLIC PANEL WITH GFR
ALT: 43 U/L (ref 0–44)
AST: 39 U/L (ref 15–41)
Albumin: 2.2 g/dL — ABNORMAL LOW (ref 3.5–5.0)
Alkaline Phosphatase: 177 U/L — ABNORMAL HIGH (ref 38–126)
Anion gap: 9 (ref 5–15)
BUN: 19 mg/dL (ref 8–23)
CO2: 24 mmol/L (ref 22–32)
Calcium: 8.6 mg/dL — ABNORMAL LOW (ref 8.9–10.3)
Chloride: 95 mmol/L — ABNORMAL LOW (ref 98–111)
Creatinine, Ser: 0.65 mg/dL (ref 0.61–1.24)
GFR, Estimated: >60 mL/min
Glucose, Bld: 195 mg/dL — ABNORMAL HIGH (ref 70–99)
Potassium: 3.4 mmol/L — ABNORMAL LOW (ref 3.5–5.1)
Sodium: 128 mmol/L — ABNORMAL LOW (ref 135–145)
Total Bilirubin: 0.9 mg/dL (ref 0.0–1.2)
Total Protein: 6.2 g/dL — ABNORMAL LOW (ref 6.5–8.1)

## 2024-01-12 LAB — URINALYSIS, W/ REFLEX TO CULTURE (INFECTION SUSPECTED)
Bilirubin Urine: NEGATIVE
Glucose, UA: >=500 mg/dL — AB
Hgb urine dipstick: NEGATIVE
Ketones, ur: NEGATIVE mg/dL
Nitrite: NEGATIVE
Protein, ur: 100 mg/dL — AB
Specific Gravity, Urine: 1.031 — ABNORMAL HIGH (ref 1.005–1.030)
WBC, UA: >50 WBC/hpf (ref 0–5)
pH: 7 (ref 5.0–8.0)

## 2024-01-12 LAB — CBC WITH DIFFERENTIAL/PLATELET
Abs Immature Granulocytes: 0.05 10*3/uL (ref 0.00–0.07)
Basophils Absolute: 0 10*3/uL (ref 0.0–0.1)
Basophils Relative: 0 %
Eosinophils Absolute: 0.1 10*3/uL (ref 0.0–0.5)
Eosinophils Relative: 1 %
HCT: 37.7 % — ABNORMAL LOW (ref 39.0–52.0)
Hemoglobin: 11.8 g/dL — ABNORMAL LOW (ref 13.0–17.0)
Immature Granulocytes: 1 %
Lymphocytes Relative: 5 %
Lymphs Abs: 0.4 10*3/uL — ABNORMAL LOW (ref 0.7–4.0)
MCH: 24.8 pg — ABNORMAL LOW (ref 26.0–34.0)
MCHC: 31.3 g/dL (ref 30.0–36.0)
MCV: 79.4 fL — ABNORMAL LOW (ref 80.0–100.0)
Monocytes Absolute: 0.3 10*3/uL (ref 0.1–1.0)
Monocytes Relative: 4 %
Neutro Abs: 5.9 10*3/uL (ref 1.7–7.7)
Neutrophils Relative %: 89 %
Platelets: 214 10*3/uL (ref 150–400)
RBC: 4.75 MIL/uL (ref 4.22–5.81)
RDW: 17.8 % — ABNORMAL HIGH (ref 11.5–15.5)
WBC: 6.6 10*3/uL (ref 4.0–10.5)
nRBC: 0 % (ref 0.0–0.2)

## 2024-01-12 LAB — LACTIC ACID, PLASMA
Lactic Acid, Venous: 2.2 mmol/L (ref 0.5–1.9)
Lactic Acid, Venous: 1.6 mmol/L (ref 0.5–1.9)

## 2024-01-12 LAB — RESP PANEL BY RT-PCR (RSV, FLU A&B, COVID)  RVPGX2
Influenza A by PCR: NEGATIVE
Influenza B by PCR: NEGATIVE
Resp Syncytial Virus by PCR: NEGATIVE
SARS Coronavirus 2 by RT PCR: NEGATIVE

## 2024-01-12 LAB — PROTIME-INR
INR: 1.1 (ref 0.8–1.2)
Prothrombin Time: 14.8 s (ref 11.4–15.2)

## 2024-01-12 MED ORDER — SODIUM CHLORIDE 0.9 % IV BOLUS
500.0000 mL | Freq: Once | INTRAVENOUS | Status: AC
  Administered 2024-01-12: 500 mL via INTRAVENOUS

## 2024-01-12 MED ORDER — LACTATED RINGERS IV SOLN: INTRAVENOUS | Status: DC

## 2024-01-12 MED ORDER — LEVOFLOXACIN IN D5W 750 MG/150ML IV SOLN
750.0000 mg | Freq: Once | INTRAVENOUS | Status: AC
  Administered 2024-01-12: 750 mg via INTRAVENOUS
  Filled 2024-01-12: qty 150

## 2024-01-12 MED ORDER — LEVOFLOXACIN 750 MG PO TABS: 750.0000 mg | ORAL_TABLET | Freq: Every day | ORAL | 0 refills | Status: DC

## 2024-01-12 MED ORDER — IOHEXOL 300 MG/ML  SOLN
100.0000 mL | Freq: Once | INTRAMUSCULAR | Status: AC | PRN
  Administered 2024-01-12: 100 mL via INTRAVENOUS

## 2024-01-12 NOTE — ED Notes (Signed)
 Spoke to [employer] ( sister and legal guardian) about the pts visit and d/c plan

## 2024-01-12 NOTE — ED Provider Notes (Signed)
 [place] EMERGENCY DEPARTMENT AT [facility] Provider Note   CSN: [id number] Arrival date & time: 01/12/24  1524     History  Chief Complaint  Patient presents with   Urinary Tract Infection    Xavier Turner is a 68 y.o. male.  HPI   This patient is a 68 year old male currently hospitalized at [facility] and rehabilitation center, he has a history of chronic hypotension, chronic lower extremity edema, chronic constipation, chronic GI bleed, chronic kidney disease stage III AA type 2 diabetes and hypertension, epilepsy, acid reflux and an unsteady gait.  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 he does not do much and does not get out of the bed very often.  Report from the nursing facility is that the patient has been complaining of increasing left-sided abdominal pain was recently diagnosed with a urinary tract infection and was found to have a blood pressure of 90/70 today.  Evidently he was started on antibiotics for a UTI on April 11, the facility reports in their written handoff that he had increasing lethargy and altered mental status.  When I asked the patient questions he is able to very clearly answer my questions and does not appear altered at all.  The patient does have a DO NOT RESUSCITATE order from his facility.  Based on the paperwork that accompanies the patient he currently takes medications including a baby aspirin , escitalopram , fenofibrate, Flomax , Jardiance , Lasix , MiraLAX , rosuvastatin , sitagliptin , currently on Bactrim for his urinary tract infection, gabapentin  and metformin .  He is also prescribed midodrine  for his chronic hypotension.  Home Medications Prior to Admission medications   Medication Sig Start Date End Date Taking? Authorizing Provider  levofloxacin  (LEVAQUIN ) 750  MG tablet Take 1 tablet (750 mg total) by mouth daily. 01/12/24  Yes Early Glisson, MD  acetaminophen  (TYLENOL ) 325 MG tablet Take 650 mg by mouth every 6 (six) hours as needed for mild pain (pain score 1-3).    [provider]  aspirin  81 MG chewable tablet Chew 81 mg by mouth daily.    [provider]  brimonidine  (ALPHAGAN ) 0.2 % ophthalmic solution Place 1 drop into both eyes 3 (three) times daily. 08/28/19   Sharion Davidson, MD  carboxymethylcellulose (REFRESH PLUS) 0.5 % SOLN Place 1 drop into both eyes every 6 (six) hours as needed (dry eyes).    [provider]  dorzolamide -timolol  (COSOPT ) 22.3-6.8 MG/ML ophthalmic solution Place 1 drop into the right eye 2 (two) times daily. 08/28/19   Sharion Davidson, MD  Emollient (EUCERIN) lotion Apply 1 Application topically every 12 (twelve) hours as needed for dry skin.    [provider]  escitalopram  (LEXAPRO ) 10 MG tablet Take 10 mg by mouth daily. 12/20/21   [provider]  fenofibrate (TRICOR) 48 MG tablet Take 48 mg by mouth daily.    [provider]  furosemide  (LASIX ) 20 MG tablet Take by mouth. 11/18/23   [provider]  gabapentin  (NEURONTIN ) 100 MG capsule Take 1 capsule (100 mg total) by mouth 2 (two) times daily. Patient taking differently: Take 100 mg by mouth 3 (three) times daily. 08/28/19   Sharion Davidson, MD  hydrocortisone  (ANUSOL -HC) 25 MG suppository Place 25 mg rectally every 8 (eight) hours as needed  for hemorrhoids.    [provider]  JARDIANCE  10 MG TABS tablet Take 10 mg by mouth daily. 04/03/21   [provider]  Menthol, Topical Analgesic, 5 % GEL Apply 1 application  topically 2 (two) times daily. Apply to bilateral knees topically twice a day    [provider]  metFORMIN  (GLUCOPHAGE ) 1000 MG tablet Take 1 tablet (1,000 mg total) by mouth 2 (two) times daily with a meal. 08/28/19   Sharion Davidson, MD  midodrine  (PROAMATINE ) 5 MG  tablet Take 1 tablet (5 mg total) by mouth 3 (three) times daily with meals. 07/16/23   Gherghe, Costin M, MD  polyethylene glycol (MIRALAX  / GLYCOLAX ) 17 g packet Take 17 g by mouth daily. Mix in 8 ounces of water . 06/06/23   Eustacio Highman, NP  ROCKLATAN  0.02-0.005 % SOLN Place 1 drop into both eyes at bedtime. 08/28/19   Sharion Davidson, MD  rosuvastatin  (CRESTOR ) 40 MG tablet Take 40 mg by mouth at bedtime. 03/14/23   [provider]  sitaGLIPtin  (JANUVIA ) 100 MG tablet Take 1 tablet (100 mg total) by mouth daily. 08/28/19   Sharion Davidson, MD  tamsulosin  (FLOMAX ) 0.4 MG CAPS capsule Take 1 capsule (0.4 mg total) by mouth daily. 08/28/19   Sharion Davidson, MD      Allergies    Penicillin g    Review of Systems   Review of Systems  All other systems reviewed and are negative.   Physical Exam Updated Vital Signs BP 97/82   Pulse 85   Temp 97.9 F (36.6 C) (Oral)   Resp (!) 22   Ht 1.651 m (5\' 5" )   Wt 88 kg   SpO2 95%   BMI 32.28 kg/m  Physical Exam Vitals and nursing note reviewed.  Constitutional:      General: He is not in acute distress.    Appearance: He is well-developed.  HENT:     Head: Normocephalic and atraumatic.     Mouth/Throat:     Pharynx: No oropharyngeal exudate.  Eyes:     General: No scleral icterus.       Right eye: No discharge.        Left eye: No discharge.     Conjunctiva/sclera: Conjunctivae normal.     Pupils: Pupils are equal, round, and reactive to light.  Neck:     Thyroid: No thyromegaly.     Vascular: No JVD.  Cardiovascular:     Rate and Rhythm: Normal rate and regular rhythm.     Heart sounds: Normal heart sounds. No murmur heard.    No friction rub. No gallop.  Pulmonary:     Effort: Pulmonary effort is normal. No respiratory distress.     Breath sounds: Normal breath sounds. No wheezing or rales.  Abdominal:     General: Bowel sounds are normal. There is no distension.     Palpations: Abdomen is soft. There is  no mass.     Tenderness: There is abdominal tenderness.  Musculoskeletal:        General: No tenderness. Normal range of motion.     Cervical back: Normal range of motion and neck supple.     Right lower leg: Edema present.     Left lower leg: Edema present.     Comments: Mild redness to the anterior bilateral lower extremities, symmetrical, nontender, no petechiae or purpura  Lymphadenopathy:     Cervical: No cervical adenopathy.  Skin:    General:  Skin is warm and dry.     Findings: No erythema or rash.  Neurological:     Mental Status: He is alert.     Coordination: Coordination normal.     Comments: Gernealized weakness, follows commands -chronic debility of left upper extremity and inability to use his left hand.  He is able to assist with rolling over in the bed, he follows commands and answers my questions appropriately  Psychiatric:        Behavior: Behavior normal.     ED Results / Procedures / Treatments   Labs (all labs ordered are listed, but only abnormal results are displayed) Labs Reviewed  LACTIC ACID, PLASMA - Abnormal; Notable for the following components:      Result Value   Lactic Acid, Venous 2.2 (*)    All other components within normal limits  COMPREHENSIVE METABOLIC PANEL WITH GFR - Abnormal; Notable for the following components:   Sodium 128 (*)    Potassium 3.4 (*)    Chloride 95 (*)    Glucose, Bld 195 (*)    Calcium  8.6 (*)    Total Protein 6.2 (*)    Albumin 2.2 (*)    Alkaline Phosphatase 177 (*)    All other components within normal limits  CBC WITH DIFFERENTIAL/PLATELET - Abnormal; Notable for the following components:   Hemoglobin 11.8 (*)    HCT 37.7 (*)    MCV 79.4 (*)    MCH 24.8 (*)    RDW 17.8 (*)    Lymphs Abs 0.4 (*)    All other components within normal limits  URINALYSIS, W/ REFLEX TO CULTURE (INFECTION SUSPECTED) - Abnormal; Notable for the following components:   Specific Gravity, Urine 1.031 (*)    Glucose, UA >=500 (*)     Protein, ur 100 (*)    Leukocytes,Ua TRACE (*)    Bacteria, UA RARE (*)    All other components within normal limits  RESP PANEL BY RT-PCR (RSV, FLU A&B, COVID)  RVPGX2  CULTURE, BLOOD (ROUTINE X 2)  CULTURE, BLOOD (ROUTINE X 2)  URINE CULTURE  LACTIC ACID, PLASMA  PROTIME-INR    EKG EKG Interpretation Date/Time:  Sunday January 12 2024 16:58:19 EDT Ventricular Rate:  89 PR Interval:  156 QRS Duration:  98 QT Interval:  389 QTC Calculation: 474 R Axis:   13  Text Interpretation: Sinus rhythm Low voltage, extremity and precordial leads Confirmed by Early Glisson ([id number]) on 01/12/2024 5:24:22 PM  Radiology CT ABDOMEN PELVIS W CONTRAST Result Date: 01/12/2024 CLINICAL DATA:  Flank pain altered EXAM: CT ABDOMEN AND PELVIS WITH CONTRAST TECHNIQUE: Multidetector CT imaging of the abdomen and pelvis was performed using the standard protocol following bolus administration of intravenous contrast. RADIATION DOSE REDUCTION: This exam was performed according to the departmental dose-optimization program which includes automated exposure control, adjustment of the mA and/or kV according to patient size and/or use of iterative reconstruction technique. CONTRAST:  OMNIPAQUE  IOHEXOL  300 MG/ML  SOLN COMPARISON:  Renal ultrasound 09/18/2022 FINDINGS: Lower chest: Lung bases demonstrate no acute airspace disease. Coronary vascular calcification. Trace pericardial effusion Hepatobiliary: Hepatic steatosis. Enlarged left hepatic lobe. Surface nodularity of the liver, suspicious for cirrhosis. Small cyst in the left hepatic lobe. Contracted gallbladder with stones. No biliary dilatation. Pancreas: Unremarkable. No pancreatic ductal dilatation or surrounding inflammatory changes. Spleen: Normal in size without focal abnormality. Adrenals/Urinary Tract: Adrenal glands are normal. Kidneys show no hydronephrosis. Nonspecific perinephric stranding. The bladder is unremarkable. Stomach/Bowel: The stomach is  nonenlarged.  No dilated small bowel. No acute bowel wall thickening. Negative appendix Vascular/Lymphatic: Aortic atherosclerosis. No enlarged abdominal or pelvic lymph nodes. Reproductive: Enlarged prostate Other: Negative for pelvic effusion or free air Musculoskeletal: No acute osseous abnormality. Scoliosis and degenerative changes. IMPRESSION: 1. No CT evidence for acute intra-abdominal or pelvic abnormality. 2. Hepatic steatosis. Enlarged left hepatic lobe with subtle surface nodularity of the liver, suspicious for cirrhosis. 3. Contracted gallbladder with stones. 4. Enlarged prostate. 5. Aortic atherosclerosis. Aortic Atherosclerosis (ICD10-I70.0). Electronically Signed   By: Esmeralda Hedge M.D.   On: 01/12/2024 20:33   DG Chest Port 1 View Result Date: 01/12/2024 CLINICAL DATA:  Possible sepsis EXAM: PORTABLE CHEST 1 VIEW COMPARISON:  07/08/2023 FINDINGS: Cardiomegaly with mild central congestion. No focal airspace disease, pleural effusion or pneumothorax IMPRESSION: Cardiomegaly with mild central congestion. Electronically Signed   By: Esmeralda Hedge M.D.   On: 01/12/2024 17:03    Procedures Procedures    Medications Ordered in ED Medications  lactated ringers  infusion ( Intravenous New Bag/Given 01/12/24 1629)  sodium chloride  0.9 % bolus 500 mL (has no administration in time range)  levofloxacin  (LEVAQUIN ) IVPB 750 mg (0 mg Intravenous Stopped 01/12/24 1846)  iohexol  (OMNIPAQUE ) 300 MG/ML solution 100 mL (100 mLs Intravenous Contrast Given 01/12/24 1934)    ED Course/ Medical Decision Making/ A&P                                 Medical Decision Making Amount and/or Complexity of Data Reviewed Labs: ordered. Radiology: ordered.  Risk Prescription drug management.   Mild swelling of the legs, increasing pain on the left abdomen, there is tenderness there but nonperitoneal, he has had some chronic hypotension so I am not sure that the hypotension he is experiencing is from sepsis  or something pathological however the patient will most certainly need to have evaluation with labs and imaging.  He does not have a fever and is not altered at this time.   Co morbidities that complicate the patient evaluation  Recurrent infections, cystitis in October 2024 when he had a fall, history of diabetes   Additional history obtained:  Additional history obtained from medical record External records from outside source obtained and reviewed including couple admissions in the past for variety of complaints including rectal bleeding, falls, cystitis, infections, weakness, hypokalemia, chronic kidney disease   Lab Tests:  I Ordered, and personally interpreted labs.  The pertinent results include: CBC with no leukocytosis, hemoglobin is better than it has been in the past and there is no thrombocytopenia.  Metabolic panel shows preserved renal function, very low albumin consistent with the patient's chronically malnutrition state, he has mild hyponatremia at 128 which is lower than it has been in the past, IV fluids given   Imaging Studies ordered:  I ordered imaging studies including the scan of the abdomen and pelvis with contrast and a chest x-ray I independently visualized and interpreted imaging which showed some cardiomegaly, no other intra-abdominal process, otherwise well-appearing I agree with the radiologist interpretation   Cardiac Monitoring: / EKG:  The patient was maintained on a cardiac monitor.  I personally viewed and interpreted the cardiac monitored which showed an underlying rhythm of: Normal sinus rhythm   Problem List / ED Course / Critical interventions / Medication management  This patient does not appear to be in distress, he does have some mild tenderness in his belly but no CT scan findings  or lab findings of concern for sepsis.  He has no tachycardia no fever no hypoxia no tachypnea no leukocytosis but he does have signs of urinary tract infection  which is I suspect the source.  Antibiotics given in the ED I have reviewed the patients home medicines and have made adjustments as needed   Social Determinants of Health:  Nursing home patient   Test / Admission - Considered:  Stable for discharge back to facility, blood pressure has been read around 100 systolic which is fairly close to patient's baseline, prior to IV fluid resuscitation he is at his baseline and is on midodrine  chronically         Final Clinical Impression(s) / ED Diagnoses Final diagnoses:  Acute cystitis without hematuria  Hyponatremia    Rx / DC Orders ED Discharge Orders          Ordered    levofloxacin  (LEVAQUIN ) 750 MG tablet  Daily        01/12/24 2113              Early Glisson, MD 01/12/24 2116

## 2024-01-12 NOTE — ED Notes (Signed)
 Pt was notified about the delay in transport. Pt's room was re adjusted to promote sleep. Call bell was moved closer to pt and door of room was shut.

## 2024-01-12 NOTE — ED Notes (Signed)
 Attempted to call pt's Nursing facility to review d/c instructions. This nurse was transferred and placed on hold for 5 mins via phone.

## 2024-01-12 NOTE — Discharge Instructions (Addendum)
 The testing shows that you do have a bladder infection, it also shows that your sodium level or your salt level is a little bit low.  We have given you some hydration and an antibiotic but you will need to continue this for the next week and have your family doctor recheck you within 3 days with blood work at your facility or at the doctor's office.  You can return to the ER for severe or worsening symptoms including fevers vomiting pain or any other worsening symptoms.  Otherwise please make sure that you are contacting your doctors office to arrange is set up for an appointment within 2 or 3 days from now

## 2024-01-12 NOTE — ED Notes (Signed)
Round check is complete at this time. Pt is resting with no s/s of distress.

## 2024-01-12 NOTE — Sepsis Progress Note (Signed)
 Notified bedside nurse of need to draw lactic acid and draw blood cultures before antibiotics given.

## 2024-01-12 NOTE — ED Triage Notes (Signed)
 EMS stated they were called out for AMS of pt. Pt is A&Ox3 per EMS and just needs help with a recurring UTI that he has been treated for for 11 days now.

## 2024-01-12 NOTE — ED Notes (Signed)
 This notice the pt's BP was low. BP was readjusted and pt was placed in a slight [place] position. Dr. Annabell Key notified.

## 2024-01-12 NOTE — Consult Note (Signed)
 CODE SEPSIS - PHARMACY COMMUNICATION  **Broad Spectrum Antibiotics should be administered within 1 hour of Sepsis diagnosis**  Time Code Sepsis Called/Page Received: 1602  Antibiotics Ordered: Levofloxacin   Time of 1st antibiotic administration: 1633  Additional action taken by pharmacy: N/A   Ramonita Burow ,PharmD Clinical Pharmacist  01/12/2024  4:03 PM

## 2024-01-12 NOTE — Sepsis Progress Note (Signed)
 Sepsis protocol is being followed by [facility].

## 2024-01-13 NOTE — ED Notes (Signed)
 Due to sleep Apnea, the pt's O2 dropped to 70% on RA. Pt admits he has sleep apnea and supposed to wear a CPAP at night. Pt admits he does not wear it at night.   Pt was placed on 2L [place] and maintains about 97% while sleeping.

## 2024-01-13 NOTE — ED Notes (Signed)
 The pt accidentally urinated all over himself. Pt was cleaned up and linens were changed in the bed. Pt was readjusted in the bed.

## 2024-01-13 NOTE — ED Notes (Signed)
 DNR with packet.

## 2024-01-13 NOTE — ED Notes (Signed)
 Attempted report to [facility]. I got Diplomatic Services operational officer but nurse station didn't answer .

## 2024-01-14 LAB — URINE CULTURE

## 2024-01-17 LAB — CULTURE, BLOOD (ROUTINE X 2)
Special Requests: ADEQUATE
Special Requests: ADEQUATE

## 2024-02-16 NOTE — Progress Notes (Unsigned)
 Xavier Turner, male    DOB: July 05, 1956    MRN: [id number]   Brief patient profile:  60  yowm  never smoker  referred to pulmonary clinic in [place]  02/20/2024 by [facility]  for osa    Eval by [facility] 2012 for severe osa not on equipment since around 2020 too noisy     History of Present Illness  02/20/2024  Pulmonary/ 1st office eval/ Rashauna Tep / [facility]  Chief Complaint  Patient presents with   Establish Care  Dyspnea:  still able to walk in hallways at [place] maybe 50 ft with cane / mostly w/c dependent no limiting doe  Cough: none  Sleep: 45 degrees elevation  SABA use: none  02: none  Mild daytime hypersomnolence mostly pc     No obvious day to day or daytime pattern/variability or assoc excess/ purulent sputum or mucus plugs or hemoptysis or cp or chest tightness, subjective wheeze or overt sinus or hb symptoms.    Also denies any obvious fluctuation of symptoms with weather or environmental changes or other aggravating or alleviating factors except as outlined above   No unusual exposure hx or h/o childhood pna/ asthma or knowledge of premature birth.  Current Allergies, Complete Past Medical History, Past Surgical History, Family History, and Social History were reviewed in [employer] record.  ROS  The following are not active complaints unless bolded Hoarseness, sore throat, dysphagia, dental problems, itching, sneezing,  nasal congestion or discharge of excess mucus or purulent secretions, ear ache,   fever, chills, sweats, unintended wt loss or wt gain, classically pleuritic or exertional cp,  orthopnea pnd or arm/hand swelling  or leg swelling, presyncope, palpitations, abdominal pain, anorexia, nausea, vomiting, diarrhea  or change in bowel habits or change in bladder habits, change in stools or change in urine, dysuria, hematuria,  rash, arthralgias, visual complaints, headache, numbness, weakness or ataxia or problems with walking or  coordination,  change in mood or  memory.            Outpatient Medications Prior to Visit  Medication Sig Dispense Refill   acetaminophen  (TYLENOL ) 325 MG tablet Take 650 mg by mouth every 6 (six) hours as needed for mild pain (pain score 1-3).     aspirin  81 MG chewable tablet Chew 81 mg by mouth daily.     brimonidine  (ALPHAGAN ) 0.2 % ophthalmic solution Place 1 drop into both eyes 3 (three) times daily. 5 mL 0   carboxymethylcellulose (REFRESH PLUS) 0.5 % SOLN Place 1 drop into both eyes every 6 (six) hours as needed (dry eyes).     dorzolamide -timolol  (COSOPT ) 22.3-6.8 MG/ML ophthalmic solution Place 1 drop into the right eye 2 (two) times daily. 10 mL 0   Emollient (EUCERIN) lotion Apply 1 Application topically every 12 (twelve) hours as needed for dry skin.     escitalopram  (LEXAPRO ) 10 MG tablet Take 10 mg by mouth daily.     fenofibrate (TRICOR) 48 MG tablet Take 48 mg by mouth daily.     furosemide  (LASIX ) 20 MG tablet Take by mouth.     gabapentin  (NEURONTIN ) 100 MG capsule Take 1 capsule (100 mg total) by mouth 2 (two) times daily. (Patient taking differently: Take 100 mg by mouth 3 (three) times daily.) 60 capsule 0   hydrocortisone  (ANUSOL -HC) 25 MG suppository Place 25 mg rectally every 8 (eight) hours as needed for hemorrhoids.     JARDIANCE  10 MG TABS tablet Take 10 mg  by mouth daily.     Menthol, Topical Analgesic, 5 % GEL Apply 1 application  topically 2 (two) times daily. Apply to bilateral knees topically twice a day     metFORMIN  (GLUCOPHAGE ) 1000 MG tablet Take 1 tablet (1,000 mg total) by mouth 2 (two) times daily with a meal. 60 tablet 0   midodrine  (PROAMATINE ) 5 MG tablet Take 1 tablet (5 mg total) by mouth 3 (three) times daily with meals.     polyethylene glycol (MIRALAX  / GLYCOLAX ) 17 g packet Take 17 g by mouth daily. Mix in 8 ounces of water . 14 each 0   ROCKLATAN  0.02-0.005 % SOLN Place 1 drop into both eyes at bedtime. 2.5 mL 0   rosuvastatin  (CRESTOR ) 40 MG  tablet Take 40 mg by mouth at bedtime.     tamsulosin  (FLOMAX ) 0.4 MG CAPS capsule Take 1 capsule (0.4 mg total) by mouth daily. 30 capsule 0   levofloxacin  (LEVAQUIN ) 750 MG tablet Take 1 tablet (750 mg total) by mouth daily. (Patient not taking: Reported on 02/20/2024) 7 tablet 0   sitaGLIPtin  (JANUVIA ) 100 MG tablet Take 1 tablet (100 mg total) by mouth daily. (Patient not taking: Reported on 02/20/2024) 30 tablet 0   No facility-administered medications prior to visit.    Past Medical History:  Diagnosis Date   Anemia    Asthma    Chronic kidney disease    Diabetes (HCC)    Glaucoma    Hemiparesis (HCC)    Hypercholesterolemia    Hyperlipemia    Melanoma (HCC)    Melanoma (HCC)    Obesity    OSA (obstructive sleep apnea)    Seizure (HCC)    Seizure disorder (HCC)    Sturge syndrome (HCC)    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HCC)       Objective:     BP 130/78   Pulse 70   SpO2 94% Comment: Room air  SpO2: 94 % (Room air)Chronically ill elderly alert wc/ bound  wm nad / extensive R facial hemangioma and swelling such that can't open R eye    HEENT : Oropharynx  clear / M4 airway      Nasal turbinates nl    NECK :  without  apparent JVD/ palpable Nodes/TM    LUNGS: no acc muscle use,  Nl contour chest which is clear to A and P bilaterally without cough on insp or exp maneuvers   CV:  RRR  no s3 or murmur or increase in P2, and no edema   ABD:  soft and nontender   MS:  ext warm with/ L leg in Brace Or obvious joint restrictions  calf tenderness, cyanosis or clubbing    SKIN: warm and dry without lesions    NEURO:  alert, approp, nl sensorium with  no motor or cerebellar deficits apparent.  L foot in splint/brace     Assessment   OBSTRUCTIVE SLEEP APNEA NPSG 2003:  AHI 133/hr Intolerant of CPAP (noise keeps him up)  I really don't think he has excessiver hypersomnolence at this point nor compication such as HBP so no need for immediate RX having stopped it " years  ago" due to noisy equipment and doubt he'd be compliant even if it was quiet but will send him on to [place] for sleep medicine to be complete/ nothing needed in meantime         Each maintenance medication was reviewed in detail including emphasizing most importantly the difference between maintenance and prns and under  what circumstances the prns are to be triggered using an action plan format where appropriate.  Total time for H and P, chart review, counseling,  and generating customized AVS unique to this office visit / same day charting = 35 min with pt new to me          Vernestine Gondola, MD 02/20/2024

## 2024-02-20 ENCOUNTER — Ambulatory Visit (INDEPENDENT_AMBULATORY_CARE_PROVIDER_SITE_OTHER): Admitting: Internal Medicine

## 2024-02-20 ENCOUNTER — Encounter: Payer: Self-pay | Admitting: Internal Medicine

## 2024-02-20 VITALS — BP 130/78 | HR 70

## 2024-02-20 DIAGNOSIS — G4733 Obstructive sleep apnea (adult) (pediatric): Secondary | ICD-10-CM

## 2024-02-20 DIAGNOSIS — Z860100 Personal history of colon polyps, unspecified: Secondary | ICD-10-CM | POA: Insufficient documentation

## 2024-02-20 NOTE — Assessment & Plan Note (Signed)
 NPSG 2003:  AHI 133/hr Intolerant of CPAP (noise keeps him up)  I really don't think he has excessiver hypersomnolence at this point nor compication such as HBP so no need for immediate RX having stopped it " years ago" due to noisy equipment and doubt he'd be compliant even if it was quiet but will send him on to [place] for sleep medicine to be complete/ nothing needed in meantime         Each maintenance medication was reviewed in detail including emphasizing most importantly the difference between maintenance and prns and under what circumstances the prns are to be triggered using an action plan format where appropriate.  Total time for H and P, chart review, counseling,  and generating customized AVS unique to this office visit / same day charting = 35 min with pt new to me

## 2024-02-20 NOTE — Patient Instructions (Signed)
 Sleep medicine evaluation in [place] either at [facility] or [facility]   Pulmonary follow up in [place] is as needed

## 2024-04-09 ENCOUNTER — Ambulatory Visit (INDEPENDENT_AMBULATORY_CARE_PROVIDER_SITE_OTHER): Admitting: Otolaryngology

## 2024-04-09 ENCOUNTER — Encounter (INDEPENDENT_AMBULATORY_CARE_PROVIDER_SITE_OTHER): Payer: Self-pay | Admitting: Otolaryngology

## 2024-04-09 VITALS — BP 128/83 | HR 69

## 2024-04-09 DIAGNOSIS — Z789 Other specified health status: Secondary | ICD-10-CM

## 2024-04-09 DIAGNOSIS — G4733 Obstructive sleep apnea (adult) (pediatric): Secondary | ICD-10-CM

## 2024-04-09 DIAGNOSIS — Z91198 Patient's noncompliance with other medical treatment and regimen for other reason: Secondary | ICD-10-CM

## 2024-04-09 NOTE — Progress Notes (Signed)
 ENT CONSULT:  Reason for Consult: OSA CPAP intolerance    HPI: Discussed the use of AI scribe software for clinical note transcription with the patient, who gave verbal consent to proceed.  History of Present Illness Xavier Turner is a 68 year old male with severe sleep apnea AHI > 100 based on sleep study in 2014, who presents with CPAP intolerance.  He has a history of obstructive sleep apnea with an apnea-hypopnea index (AHI) over 100. He is not currently using CPAP due to intolerance, as he found the machine to be noisy, which contributed to his discontinuation of its use.  He feels very sleepy and tired during the day. He experiences episodes of irritability, where he gets mad and loses control. Additionally, he has episodes of confusion, sometimes not knowing how he got to certain places or where his family members are.  Records Reviewed:  Neurology visit for OSA Dr Dohmeier 03/13/2013  Xavier Turner is a 68 y.o. male here since 2013 as a referral from Dr. Sonda for  Apnea follow up and Sharlynn Patient syndrome , phakomatosis. This is a revisit.    Xavier Turner  is a mean bile 68 year old Caucasian male with a history of Sturge-Weber syndrome and related epilepsy, MRDD , hemiparesis , and resides in a group home.  The patient underwent brain surgery in 1999 under Dr. Steve Glazier [facility] became Seizure-free afterwards. He had been diagnosed with apnea,  I believe originally at [facility] and was prescribed a CPAP but it was removed after the day the download showed noncompliance.  The patient had also seen Dr. Reggy Salt at [facility] in 2011 or 2012. Since Dr. Sonda is retired , the patient has followed up with his new  primary care provider Dr. Sophronia . I first saw Xavier Turner on 06-02-12 and after a sleep study had been ordered , the  sleep study took place on 04/15/2012 - it became a split night study based on the patient's Epworth sleepiness core of 18 points his  Beck Depression Inventory at 13 points, this neck circumference of 20 inches and is BMI of 36.7.  He was diagnosed with a very severe form of obstructive dominant apnea he had 138 obstructive sleep apnea events, 52 central apneas and 52 mixed apneas his apnea index was 113.9 he was titrated first to CPAP which triggered more off the central apnea events he was then changed to a BiPAP and he could tolerate a BiPAP setting of 14 cm water  of 8 cm water .   The seemed to be effective it should be noted that the patient slept 144 minutes at the setting the AHI was averaging 9 an hour which was the best so far he could do for him. Daily at obtained a download from the CPAP machine they can compare it to the previous Download from  9th of September 2013- the patient uses a VPAP ultra machine settings are 8 cm expiratory pressure and an inspiratory pressure of 14 cm water ,  he has a very low airleak , RR 16 per minute on average respiratory rate, apnea index of 8.3 ,  the median daily usage was 4 hours and 14 minutes,  but there are many days on which the patient was not using his machine.   Past Medical History:  Diagnosis Date   Anemia    Asthma    Chronic kidney disease    Diabetes (HCC)    Glaucoma  Hemiparesis (HCC)    Hypercholesterolemia    Hyperlipemia    Melanoma (HCC)    Melanoma (HCC)    Obesity    OSA (obstructive sleep apnea)    Seizure (HCC)    Seizure disorder (HCC)    Sturge syndrome (HCC)    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HCC)     Past Surgical History:  Procedure Laterality Date   AMPUTATION Left 07/24/2019   Procedure: AMPUTATION RAY, fifth left;  Surgeon: Harden Jerona GAILS, MD;  Location: [facility] OR;  Service: Orthopedics;  Laterality: Left;   COLONOSCOPY     COLONOSCOPY WITH PROPOFOL  N/A 08/26/2023   Procedure: COLONOSCOPY WITH PROPOFOL ;  Surgeon: Cindie Carlin POUR, DO;  Location: [facility];  Service: Endoscopy;  Laterality: N/A;  12:30 pm, asa 3   CRANIOTOMY  1999   FLEXIBLE  SIGMOIDOSCOPY  04/08/2023   Procedure: FLEXIBLE SIGMOIDOSCOPY;  Surgeon: Cindie Carlin POUR, DO;  Location: [facility];  Service: Endoscopy;;   melanoma removal  2011   POLYPECTOMY  08/26/2023   Procedure: POLYPECTOMY;  Surgeon: Cindie Carlin POUR, DO;  Location: [facility];  Service: Endoscopy;;    Family History  Problem Relation Age of Onset   Emphysema Father    Cancer Father    Asthma Mother     Social History:  reports that he has never smoked. He has never used smokeless tobacco. He reports that he does not currently use alcohol . He reports that he does not currently use drugs.  Allergies:  Allergies  Allergen Reactions   Penicillin G Anaphylaxis    Not on the [facility]    Medications: I have reviewed the patient's current medications.  The PMH, PSH, Medications, Allergies, and SH were reviewed and updated.  ROS: Constitutional: Negative for fever, weight loss and weight gain. Cardiovascular: Negative for chest pain and dyspnea on exertion. Respiratory: Is not experiencing shortness of breath at rest. Gastrointestinal: Negative for nausea and vomiting. Neurological: Negative for headaches. Psychiatric: The patient is not nervous/anxious  Blood pressure 128/83, pulse 69, SpO2 94%. There is no height or weight on file to calculate BMI.  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 No polyp, or purulence. Mucosal edema and erythema present.  Bilateral inferior turbinate hypertrophy.  Lips, Teeth, and gums: Mucosa and teeth intact and viable TMJ: No pain to palpation with full mobility Oral cavity/oropharynx: No erythema or exudate, no lesions present Neck Neck and Trachea: Midline trachea without mass or lesion Thyroid: No mass or nodularity Lymphatics: No lymphadenopathy   Studies Reviewed: Sleep study split-night 04/15/2012 ESS 18 BMI 36.7 Obstructive apneas 138 Central and mixed apneas 52+52 AHI 113.9   Assessment/Plan: Encounter Diagnoses  Name Primary?   Obstructive sleep apnea Yes   Intolerance of continuous positive airway  pressure (CPAP) ventilation     Assessment and Plan Assessment & Plan Severe obstructive sleep apnea Severe OSA with AHI >100 based on sleep study results I reviewed. Not a surgical candidate for Inspire. Discontinued CPAP due to noise, causing excessive sleepiness, fatigue, confusion. Newer CPAP machines quieter, may improve tolerance. - Contact [facility] for new quieter CPAP machine. Referral placed since he has not been since for > 10 years  - Provided contact information for [facility]. - Advise appointment with [facility] to discuss CPAP options.   Thank you for allowing me to participate in the care of this patient. Please do not hesitate to contact me with any questions or concerns.   Elena Larry, MD Otolaryngology [facility] Phone: [phone] Fax: [phone]    04/09/2024, 9:06 AM

## 2024-04-16 ENCOUNTER — Encounter: Payer: Self-pay | Admitting: Podiatry

## 2024-04-16 ENCOUNTER — Ambulatory Visit (INDEPENDENT_AMBULATORY_CARE_PROVIDER_SITE_OTHER): Admitting: Podiatry

## 2024-04-16 DIAGNOSIS — L84 Corns and callosities: Secondary | ICD-10-CM

## 2024-04-16 DIAGNOSIS — E785 Hyperlipidemia, unspecified: Secondary | ICD-10-CM | POA: Diagnosis not present

## 2024-04-16 DIAGNOSIS — E1169 Type 2 diabetes mellitus with other specified complication: Secondary | ICD-10-CM

## 2024-04-16 DIAGNOSIS — M79674 Pain in right toe(s): Secondary | ICD-10-CM

## 2024-04-16 DIAGNOSIS — B351 Tinea unguium: Secondary | ICD-10-CM

## 2024-04-16 DIAGNOSIS — M79675 Pain in left toe(s): Secondary | ICD-10-CM

## 2024-04-16 NOTE — Progress Notes (Signed)
  Subjective:  Patient ID: Xavier Turner, male    DOB: [birth year],  MRN: [id number]  Chief Complaint  Patient presents with   Diabetes    [facility] toenail trim. NIDDM. A1C 6.4. Callus left latera foot.    68 y.o. male returns with the above complaint. History confirmed with patient.  Here today for at risk diabetic foot care his nails are thickened elongated and causing pain again.  Callus has returned and is painful as well into different sites on the outside of the left foot.  No other new foot or other medical issues  Objective:  Physical Exam: warm, good capillary refill, no trophic changes or ulcerative lesions, normal DP and PT pulses, and abnormal sensory exam.  He has severe pes cavus foot deformity.  Prominent area of tenderness over the fifth metatarsal base plantarly.  Hyperkeratotic painful callus here.  Additional painful hyperkeratotic callus on the fourth metatarsal head thickened elongated nails with yellow discoloration and subungual debris of all remaining toenails   Assessment:   1. Callus of foot   2. Pain due to onychomycosis of toenails of both feet   3. Type 2 diabetes mellitus with hyperlipidemia (HCC)          Plan:  Patient was evaluated and treated and all questions answered.     Discussed the etiology and treatment options for the condition in detail with the patient.  Recommended debridement of the nails today. Sharp and mechanical debridement performed of all painful and mycotic nails today. Nails debrided in length and thickness using a nail nipper to level of comfort. Discussed treatment options including appropriate shoe gear. Follow up as needed for painful nails.  All symptomatic hyperkeratoses were safely debrided with a sterile #15 blade to patient's level of comfort without incident. We discussed preventative and palliative care of these lesions including supportive and accommodative shoegear, padding, prefabricated and custom molded accommodative  orthoses, use of a pumice stone and lotions/creams daily.  Return in about 4 months (around 08/17/2024) for at risk diabetic foot care.

## 2024-04-30 ENCOUNTER — Ambulatory Visit: Admitting: Gastroenterology

## 2024-05-12 ENCOUNTER — Ambulatory Visit: Admitting: Gastroenterology

## 2024-05-12 ENCOUNTER — Ambulatory Visit (INDEPENDENT_AMBULATORY_CARE_PROVIDER_SITE_OTHER): Admitting: Neurology

## 2024-05-12 ENCOUNTER — Encounter: Payer: Self-pay | Admitting: Neurology

## 2024-05-12 VITALS — BP 122/70 | HR 76 | Ht 64.0 in | Wt 170.2 lb

## 2024-05-12 DIAGNOSIS — Z8669 Personal history of other diseases of the nervous system and sense organs: Secondary | ICD-10-CM | POA: Insufficient documentation

## 2024-05-12 DIAGNOSIS — G8194 Hemiplegia, unspecified affecting left nondominant side: Secondary | ICD-10-CM | POA: Diagnosis not present

## 2024-05-12 DIAGNOSIS — Q8589 Other phakomatoses, not elsewhere classified: Secondary | ICD-10-CM | POA: Diagnosis not present

## 2024-05-12 DIAGNOSIS — G4733 Obstructive sleep apnea (adult) (pediatric): Secondary | ICD-10-CM | POA: Diagnosis not present

## 2024-05-12 DIAGNOSIS — F79 Unspecified intellectual disabilities: Secondary | ICD-10-CM

## 2024-05-12 MED ORDER — ALPRAZOLAM 0.5 MG PO TABS: 0.5000 mg | ORAL_TABLET | Freq: Every evening | ORAL | 0 refills | Status: DC | PRN

## 2024-05-12 NOTE — Patient Instructions (Signed)
 Severe obstructive- complex  sleep apnea with AHI >100 based on sleep study results I reviewed. Not a surgical candidate for Inspire. Discontinued CPAP due to noise, causing excessive sleepiness, fatigue, confusion. Newer CPAP machines quieter, may improve tolerance.    He is here to see one more time if he can make PAP work for him,  he tells me today.  He has supposingly  lost weight from BMI 36 to 29. I doubt this is correct, he has abdominal  girth and a large neck.    1) Plan ;  will invite for an in lab sleep study , but need a sitter with him. SPLIT study .   Xanax  as sleep aid ordered.  Desensitization mask fit in lab.   Needs to have been cleanly shaved before he comes in for sleep study - I  conveyed  this to his group home .  He needs to bring a sitter, too.   2) SPLIT at 40 /h.   3) RV in 5 months.    The patient is very drowsy today and endorse the Epworth score at 16 points.  . The patient is hemiparetic, not normocephalic , and has restricted visual field and dexterity .  he has trouble to readjust and replace the mask after a bathroom break. His regular bedtime as intended is 10-11 PM and he rises in the morning between 5 to 6 AM. He goes to the bathroom up to  4 times at night or more. Educational material about sleep at apnea ,the risk factors of SA,  and sleep apnea as a risk factor for surgery etc were provided.

## 2024-05-12 NOTE — Progress Notes (Signed)
 SLEEP MEDICINE CLINIC    Provider:  Dedra Gores, MD  Primary Care Physician:  System, Provider Not In No address on file     Referring Provider: Okey Burns, Md [address] [place],  [place] [address]          Chief Complaint according to patient   Patient presents with:     New Patient (Initial Visit)           HISTORY OF PRESENT ILLNESS:  Xavier Turner is a 68 y.o. male patient who is seen upon Dr Vallery referral on 05/12/2024  for a complicated complex apnea history and inability to tolerate the noises of  BiPAP . 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 He has very thick facial hair as well.  He has long standing hemiparesis on the left.  No seizures since Epilepsy Surgery at [facility] dr Dotty. .    Chief concern according to patient :   I don't like CPAP and I can't sleep with it.    I have the pleasure of seeing CLYDELL SPOSITO on 05/12/24   The patient had several sleep studies which are quoted below.  Studies Reviewed: Sleep study split-night on  04/15/2012 at [facility].  ESS 18/ 24  BMI 36.7 Obstructive apneas 138 Central and mixed apneas 52+52 AHI 113.9/h      Family medical /sleep history: no other family member on CPAP with OSA, insomnia, sleep walkers.    Social history: 2 sisters  living , mother passed in the last 5 years at age 67, he reports.       Sleep habits are as follows:  bedtime between 10-11 Pm and rise time between 5-6 AM, no daytime naps he stated.  Very vivid dreams.  Nocturia, but also BPH- he cannot empty the bladder easily.     Xavier Turner is a 68 y.o. male here since 2013 as a referral from Dr. Alm Hoit for Sleep Apnea follow up and  he has Sharlynn Patient syndrome , phakomatosis.   Xavier Turner  is a meanwhile 68 year old Caucasian male with a history of Sturge-Weber syndrome and related epilepsy, MRDD  , hemiparesis , and resides in a group home.   The patient underwent brain surgery in 1999 under Dr. Steve Glazier [facility] became Seizure-free afterwards.  He had been diagnosed with apnea,  I believe originally at [facility] in 2008 , and was prescribed a CPAP-  but it was removed after the day the download showed noncompliance.   The patient had also seen Dr. Reggy Salt at [facility] sleep center  in 2011 or 2012.  Since Dr. Hoit is retired , the patient has followed up with his new primary care provider Dr. Sophronia.   I first saw Xavier Turner on 06-02-12 and after a sleep study had been ordered , the  sleep study took place on 04/15/2012 - it became a split night study. The need for a sleep study was high  based on the patient's Epworth sleepiness core of 18 points , his Beck Depression Inventory at 13 points, this neck circumference of 20 inches and is BMI of 36.7.  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    The seemed to be effective it should be noted that the patient slept 144 minutes at the setting the AHI was averaging 9 an hour which was the best so far he could do for him. Daily at obtained a download from the CPAP machine they can compare it to the previous download from  9th of September 2013-  the patient uses a VPAP ultra machine settings are 8 cm expiratory pressure and an inspiratory pressure of 14 cm water ,  he has a very low airleak , RR 16 per minute on average respiratory rate, apnea index of 8.3 ,  the median daily usage was 4 hours and 14 minutes -  but there are many days on which the patient was not using his machine.   Review of Systems: Out of a  complete 14 system review, the patient complains of only the following symptoms, and all other reviewed systems are negative.:   Hemiparesis with left arm being smaller, shorter and writs is contracted, since birth-   Sturgey-weber syndrome .   Wheelchair.    Fatigue, sleepiness , snoring, fragmented sleep, Nocturia    How likely are you to doze in the following situations: 0 = not likely, 1 = slight chance, 2 = moderate chance, 3 = high chance   Sitting and Reading? Watching Television? Sitting inactive in a public place (theater or meeting)? As a passenger in a car for an hour without a break? Lying down in the afternoon when circumstances permit? Sitting and talking to someone? Sitting quietly after lunch without alcohol ? In a car, while stopped for a few minutes in traffic?   Total = 16/ 24 points   FSS endorsed at 27/ 63 points.  GDS 3/ 15 points  Social History   Socioeconomic History   Marital status: Single    Spouse name: Not on file   Number of children: Not on file   Years of education: Not on file   Highest education level: Not on file  Occupational History   Occupation: [employer]    Comment: resides in a group home([facility])  Tobacco Use   Smoking status: Never   Smokeless tobacco: Never  Vaping Use   Vaping status: Never Used  Substance and Sexual Activity   Alcohol  use: Not Currently   Drug use: Not Currently   Sexual activity: Not Currently  Other Topics Concern   Not on file  Social History Narrative   Single.  Employed by [employer].  Never smoker. Has legal guardian.    Social Drivers of Corporate investment banker Strain: Low Risk  (10/25/2021)   Received from [facility]   Overall Financial Resource Strain (CARDIA)    Difficulty of Paying Living Expenses: Not hard at all  Food Insecurity: No Food Insecurity (10/25/2021)   Received from [facility]   Hunger Vital Sign    Within the past 12 months, you worried that your food  would run out before you got the money to buy more.: Never true    Within the past 12 months, the food you bought just didn't last and you didn't have money to get more.: Never true  Transportation Needs: No Transportation Needs (10/25/2021)   Received [facility] - [facility]  Lack of Transportation (Medical): No    Lack of Transportation (Non-Medical): No  Physical Activity: Unknown (10/25/2021)   Received from [facility]   Exercise Vital Sign    On average, how many days per week do you engage in moderate to strenuous exercise (like a brisk walk)?: 0 days    Minutes of Exercise per Session: Not on file  Recent Concern: Physical Activity - Inactive (10/25/2021)   Received from [facility]   Exercise Vital Sign    Days of Exercise per Week: 0 days    Minutes of Exercise per Session: 0 min  Stress: Stress Concern Present (10/25/2021)   Received from [facility] - Occupational Stress Questionnaire    Feeling of Stress : To some extent  Social Connections: Unknown (01/21/2022)   Received from [facility]   Social Network    Social Network: Not on file  Recent Concern: Social Connections - Socially Isolated (10/25/2021)   Received from [facility]   Social Connection and Isolation Panel    In a typical week, how many times do you talk on the phone with family, friends, or neighbors?: Never    How often do you get together with friends or relatives?: Never    How often do you attend church or religious services?: Never    Do you belong to any clubs or organizations such as church groups, unions, fraternal or athletic groups, or school groups?: Yes    How often do you attend meetings of the clubs or organizations you belong to?: Never    Are you married, widowed, divorced, separated, never married, or living with a partner?: Never married    Family History  Problem Relation Age of Onset   Emphysema Father    Cancer  Father    Asthma Mother     Past Medical History:  Diagnosis Date   Anemia    Asthma    Chronic kidney disease    Diabetes (HCC)    Glaucoma    Hemiparesis (HCC)    Hypercholesterolemia    Hyperlipemia    Melanoma (HCC)    Melanoma (HCC)    Obesity    OSA (obstructive sleep apnea)    Seizure (HCC)    Seizure disorder (HCC)    Sturge syndrome (HCC)    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HCC)     Past Surgical History:  Procedure Laterality Date   AMPUTATION Left 07/24/2019   Procedure: AMPUTATION RAY, fifth left;  Surgeon: Harden Jerona GAILS, MD;  Location: [facility] OR;  Service: Orthopedics;  Laterality: Left;   COLONOSCOPY     COLONOSCOPY WITH PROPOFOL  N/A 08/26/2023   Procedure: COLONOSCOPY WITH PROPOFOL ;  Surgeon: Cindie Carlin POUR, DO;  Location: [facility] ENDO SUITE;  Service: Endoscopy;  Laterality: N/A;  12:30 pm, asa 3   CRANIOTOMY  1999   FLEXIBLE SIGMOIDOSCOPY  04/08/2023   Procedure: FLEXIBLE SIGMOIDOSCOPY;  Surgeon: Cindie Carlin POUR, DO;  Location: [facility] END[facility] SU[facility];  Service: Endoscopy;;   melanoma removal  2011   POLYPECTOMY  08/26/2023   Procedure: POLYPECTOMY;  Surgeon: Cindie Carlin POUR, DO;  Location: [facility];  Service: Endoscopy;;     Current Outpatient Medications on File Prior to Visit  Medication Sig Dispense Refill   acetaminophen  (TYLENOL ) 325 MG tablet Take 650 mg by mouth every 6 (six) hours as needed for mild pain (pain score 1-3).     aspirin  81 MG chewable tablet Chew 81 mg by mouth daily.  bisacodyl  (DULCOLAX) 5 MG EC tablet Take 5 mg by mouth daily as needed for moderate constipation.     brimonidine  (ALPHAGAN ) 0.2 % ophthalmic solution Place 1 drop into both eyes 3 (three) times daily. 5 mL 0   carboxymethylcellulose (REFRESH PLUS) 0.5 % SOLN Place 1 drop into both eyes every 6 (six) hours as needed (dry eyes).     dorzolamide -timolol  (COSOPT ) 22.3-6.8 MG/ML ophthalmic solution Place 1 drop into the right eye 2 (two) times daily. 10 mL 0   Emollient (EUCERIN)  lotion Apply 1 Application topically every 12 (twelve) hours as needed for dry skin.     escitalopram  (LEXAPRO ) 10 MG tablet Take 10 mg by mouth daily.     fenofibrate (TRICOR) 48 MG tablet Take 48 mg by mouth daily.     furosemide  (LASIX ) 20 MG tablet Take by mouth.     gabapentin  (NEURONTIN ) 100 MG capsule Take 1 capsule (100 mg total) by mouth 2 (two) times daily. (Patient taking differently: Take 100 mg by mouth 3 (three) times daily.) 60 capsule 0   hydrocortisone  (ANUSOL -HC) 25 MG suppository Place 25 mg rectally every 8 (eight) hours as needed for hemorrhoids.     JARDIANCE  10 MG TABS tablet Take 10 mg by mouth daily.     levofloxacin  (LEVAQUIN ) 750 MG tablet Take 1 tablet (750 mg total) by mouth daily. 7 tablet 0   Menthol, Topical Analgesic, 5 % GEL Apply 1 application  topically 2 (two) times daily. Apply to bilateral knees topically twice a day     metFORMIN  (GLUCOPHAGE ) 1000 MG tablet Take 1 tablet (1,000 mg total) by mouth 2 (two) times daily with a meal. 60 tablet 0   midodrine  (PROAMATINE ) 5 MG tablet Take 1 tablet (5 mg total) by mouth 3 (three) times daily with meals.     polyethylene glycol (MIRALAX  / GLYCOLAX ) 17 g packet Take 17 g by mouth daily. Mix in 8 ounces of water . 14 each 0   ROCKLATAN  0.02-0.005 % SOLN Place 1 drop into both eyes at bedtime. 2.5 mL 0   rosuvastatin  (CRESTOR ) 40 MG tablet Take 40 mg by mouth at bedtime.     sitaGLIPtin  (JANUVIA ) 100 MG tablet Take 1 tablet (100 mg total) by mouth daily. 30 tablet 0   tamsulosin  (FLOMAX ) 0.4 MG CAPS capsule Take 1 capsule (0.4 mg total) by mouth daily. 30 capsule 0   No current facility-administered medications on file prior to visit.    Allergies  Allergen Reactions   Penicillin G Anaphylaxis    Not on the [facility]     DIAGNOSTIC DATA (LABS, IMAGING, TESTING) - I reviewed patient records, labs, notes, testing and imaging myself where available.  Lab Results  Component Value Date   WBC 6.6 01/12/2024   HGB 11.8  (L) 01/12/2024   HCT 37.7 (L) 01/12/2024   MCV 79.4 (L) 01/12/2024   PLT 214 01/12/2024      Component Value Date/Time   NA 128 (L) 01/12/2024 1731   NA 141 11/20/2021 1213   K 3.4 (L) 01/12/2024 1731   CL 95 (L) 01/12/2024 1731   CO2 24 01/12/2024 1731   GLUCOSE 195 (H) 01/12/2024 1731   BUN 19 01/12/2024 1731   BUN 20 11/20/2021 1213   CREATININE 0.65 01/12/2024 1731   CALCIUM  8.6 (L) 01/12/2024 1731   PROT 6.2 (L) 01/12/2024 1731   ALBUMIN 2.2 (L) 01/12/2024 1731   AST 39 01/12/2024 1731   ALT 43 01/12/2024 1731   ALKPHOS  177 (H) 01/12/2024 1731   BILITOT 0.9 01/12/2024 1731   GFRNONAA >60 01/12/2024 1731   GFRAA >60 09/07/2019 1647   No results found for: CHOL, HDL, LDLCALC, LDLDIRECT, TRIG, CHOLHDL Lab Results  Component Value Date   HGBA1C 6.4 (H) 07/09/2023   No results found for: VITAMINB12 Lab Results  Component Value Date   TSH 1.133 07/06/2019    PHYSICAL EXAM:  Today's Vitals   05/12/24 0842  BP: 122/70  Pulse: 76  Weight: 170 lb 3.2 oz (77.2 kg)  Height: 5' 4 (1.626 m)   Body mass index is 29.21 kg/m.   Wt Readings from Last 3 Encounters:  05/12/24 170 lb 3.2 oz (77.2 kg)  01/12/24 194 lb 0.1 oz (88 kg)  12/26/23 194 lb (88 kg)     Ht Readings from Last 3 Encounters:  05/12/24 5' 4 (1.626 m)  01/12/24 5' 5 (1.651 m)  12/26/23 5' 5 (1.651 m)      General: The patient is awake, alert and appears not in acute distress. The patient is appropriately dressed. Head:  former brain surgery ,  facial - stain  Neck is supple.  Mallampati  3,  neck circumference:20 inches .  Jaw  Nasal airflow not fully patent.   No Retrognathia is seen.  Dental status:  irregular , crossbite ?  Cardiovascular:  Regular rate and cardiac rhythm by pulse,  without distended neck veins. Respiratory: Lungs are clear to auscultation.  Skin:  Without evidence of ankle edema, or rash. Trunk: The patient's posture is erect.   NEUROLOGIC EXAM: The  patient is awake and alert, oriented to place and time.   Memory subjective described as intact.  Attention span & concentration ability appears normal.  He repeats many sentences. Speech is slowed but fluent,  with dysphonia , slight lisp dysarthria  Mood and affect are appropriate.   Cranial nerves: no loss of smell or taste reported  Pupils are equal and briskly reactive to light.   His right eye is completely covered by eye lid.  Visual fields left eye only . Hearing was intact to soft voice and finger rubbing.    Facial sensation intact to fine touch. Normal sensation over the port wine stain.   Facial motor strength is asymmetric , right facial droop since birth,  severe deteriorated dental status and crossbite, facial bone asymmetry assumed,  right eye is covered by fleshy lid.   is likely underlying. .  Neck ROM : rotation, tilt and flexion extension were normal for age and shoulder shrug was asymmetrical.  Left shoulder elevated.     Motor exam:   left side spastic since birth.  Born with hemiparesis on the left side.  Sensory:   normal , bilaterally able to feel vibration and hear equally. .   Coordination: Rapid alternating movements in the fingers/hands were of normal speed.  The Finger-to-nose maneuver was intact without evidence of ataxia, dysmetria or tremor.   Gait and station: off balance, hemiparetic , left side weakness and spasticity.   Patient could not rise unassisted from a seated position,  wheel chair bound.  Deep tendon reflexes: Babinski  up-going on the left.     ASSESSMENT AND PLAN 68 y.o. year old male  here with:  Patent with a complex apnea history and many difficulties with PAP therapy, including hemiparesis and dexterity problems, nocturia , and facial port wine stain , large.  Rikki Smestad is a 48- year old male with STURGEY WEBER  SYNDROME and severe complex type sleep apnea AHI > 100 based on sleep study in 2014, who presents with reported  CPAP intolerance.    He has a history of obstructive sleep apnea with an apnea-hypopnea index (AHI) over 100. He is not currently using CPAP due to intolerance, as he found the machine to be noisy, which contributed to his discontinuation of its use.   He feels very sleepy and tired during the day. He experiences episodes of irritability, and episodes of confusion,  disoriented to time of day.   Severe obstructive- complex  sleep apnea with AHI >100 based on sleep study results I reviewed. Not a surgical candidate for Inspire. Discontinued CPAP due to noise, causing excessive sleepiness, fatigue, confusion. Newer CPAP machines quieter, may improve tolerance.    He is here to see one more time if he can make PAP work for him,  he tells me today.  He has supposingly  lost weight from BMI 36 to 29. I doubt this is correct, he has abdominal  girth and a large neck.    1) Plan ;  will invite for an in lab sleep study , but need a sitter with him. SPLIT study .   Xanax  as sleep aid ordered.  Desensitization mask fit in lab.   Needs to have been cleanly shaved before he comes in for sleep study - I  conveyed  this to his group home .  He needs to bring a sitter, too.   2) SPLIT at 40 /h.   3) RV in 5 months.    The patient is very drowsy today and endorse the Epworth score at 16 points.  . The patient is hemiparetic, not normocephalic , and has restricted visual field and dexterity .  he has trouble to readjust and replace the mask after a bathroom break. His regular bedtime as intended is 10-11 PM and he rises in the morning between 5 to 6 AM. He goes to the bathroom up to  4 times at night or more. Educational material about sleep at apnea ,the risk factors of SA,  and sleep apnea as a risk factor for surgery etc were provided.    I plan to follow up either personally or through our NP within 5 months.   I would like to thank  Okey Burns, Md [address] [place],   [place] [address] for allowing me to meet with and to take care of this pleasant patient.   Discussion of sleep hygiene setting bedtime and rise time,  hot shower  before bed time, no screen light in the bedroom, the bedroom should be cool, quiet and dark. Night lights should illuminate the floor not shine into your eyes. Golden glow  light is less intrusive than blue or cold light.  Read in a book with pages, not on a device. Consider audio books and soothing  sound -scapes.     After spending a total time of  45  minutes face to face and additional time for physical and neurologic examination, review of laboratory studies,  personal review of imaging studies, reports and results of other testing and review of referral information / records as far as provided in visit,   Electronically signed by: Dedra Gores, MD 05/12/2024 8:57 AM  [facility] Board certified by The [employer] of Sleep Medicine and Diplomate of the [employer] of Sleep Medicine. Board certified In Neurology through the ABPN, Fellow of  the [employer] of Neu[employer].

## 2024-05-21 ENCOUNTER — Telehealth: Payer: Self-pay | Admitting: Neurology

## 2024-05-21 NOTE — Telephone Encounter (Signed)
 called [facility] in [place] LVM for Maynard to call back to schedule SS    Medicare/medicaid no auth req

## 2024-05-26 NOTE — Telephone Encounter (Signed)
 LVM with Belinda at [facility] to call back to schedule SS.

## 2024-05-27 NOTE — Telephone Encounter (Signed)
 Belinda with [facility] called me back. We schedule the patient for 06/22/24 at 8 pm.  She request I fax the packet of information to her her fax number is [phone] & her phone number is [phone].   Split Medicare/medicaid no auth req *[facility] is dropping patient off*

## 2024-06-10 NOTE — Progress Notes (Addendum)
 GI Office Note    Referring Provider: No ref. provider found Primary Care Physician:  System, Provider Not In Primary Gastroenterologist: Carlin POUR. Cindie, DO  Date:  06/11/2024  ID:  Xavier Turner, DOB 11/30/1955, MRN [id number]   Chief Complaint   Chief Complaint  Patient presents with   Follow-up    Follow up. No problems    History of Present Illness  Xavier Turner is a 68 y.o. 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  Colonoscopy in 2006 with Dr. Avram which was normal.  About an hour and a half after procedure he developed some abdominal discomfort and had some air trapping for which scope was reinserted and colon Decompressed.  He was recommended to have a colonoscopy in 10 years given he has a second-degree relative with colon cancer.  Echocardiogram in January 2024 with EF 60 to 65%.   Seen for initial consultation 03/06/2023.  Patient reported a couple episodes of BRBPR.  Had a normal-looking stool followed by a looser stool with blood present both times.  With some subsequent other bowel movements he has had a little bit of blood as well and some mild abdominal pain.  He reported a good appetite along with some mild rectal discomfort.  He denies any chest pain, shortness of breath, fatigue, or dizziness.  He had reported some history of rectal bleeding in his family as well.  Review of his chart indicated that he was recommended to have a colonoscopy in 2013 but does not appear that this was performed.  Rectal exam in the office did not reveal any mass.  FOBT in the office was positive.  Patient was advised to schedule colonoscopy and use Preparation H twice daily for 1 week.   Colonoscopy 04/08/2023: -Patient had an inadequate prep as there was stool in the rectum and sigmoid colon   OV 06/06/23.  Reported rectal bleeding that morning with  bowel movement.  Unable to tolerate taste of colonoscopy prep, given that he will most treatment 4 times he quit drinking.  Continuing to have rectal discomfort but denies abdominal pain.  Reportingly feeling like a fingernail and a scrape to his rectum, pain with bowel movements.  Having issues with bowel movements on a more regular basis despite daily bowel movement.  Spoke with patient's guardian who agreed with reattempt at colonoscopy.  Rescheduled.  Ordered compound hemorrhoid cream with lidocaine and nifedipine to treat for anal fissure.  Advised to start MiraLAX  daily along with Colace.   Colonoscopy was ultimately canceled given patient was hospitalized and then rescheduled for December 2024.   Colonoscopy December 2024: -4 mm sessile polyp in the transverse colon -6 mm sessile polyp in the rectum -Stool in entire examined colon -Nonbleeding internal hemorrhoids -Pathology revealed hyperplastic polyps -Repeat colonoscopy in 5 years  Last office visit 12/26/23.  Reports intermittent constipation, denied need for frequent hygiene after bowel movements.  Denied any abdominal pain.  Having a good appetite.  Eye movement usually every other day but at times can go up to a week without a bowel movement.  Overall feeling pretty well.  Denies any chest pain or shortness of breath.  Last rectal bleeding episode was about 2 months prior after a big bowel movement.  Advised to check CBC and iron panel. Increase miralax  to BID. Dulcolax as needed. Avoid straining. Hemorrhoid cream as needed. F/u in  4 months.      Latest Ref Rng & Units 01/12/2024    5:31 PM 07/12/2023    5:01 AM 07/11/2023    2:58 AM  CBC  WBC 4.0 - 10.5 K/uL 6.6  5.9  6.4   Hemoglobin 13.0 - 17.0 g/dL 88.1  9.6  9.3   Hematocrit 39.0 - 52.0 % 37.7  31.0  29.6   Platelets 150 - 400 K/uL 214  173  178      Today:  Discussed the use of AI scribe software for clinical note transcription with the patient, who gave verbal consent  to proceed.  Presents from [facility].  He experiences significant sharp, stabbing pain in the lower abdomen and lower back, occurring daily, primarily in the morning upon waking, and abdominal pain lasting about a minute. The pain is severe enough to hinder his ability to get out of bed immediately - more so the back pain, requiring him to sit for a few minutes before moving. Bowel movements do not alleviate the discomfort.  He experiences diarrhea predominantly in the morning, occurring about three times before subsiding in the afternoon. He is currently on Miralax  twice a day, though he is unsure of the timing of administration. He is concerned about the potential for diarrhea if the dosage is increased or other things added to his regimen. Denies any melena or brbpr. No rectal pain.   He has experienced a weight loss of approximately twenty pounds since April without specific effort. His appetite remains good and eats well - aide agrees, and he denies nausea or vomiting. He denies reflux symptoms. He reports feeling tired and worn out most days, despite not engaging in strenuous activities.  He recalls a recent episode of chest pain around the heart area, which was severe enough to nearly cause him to feel like he may pass out. This episode occurred either the previous day or the day before that. No significant shortness of breath, fever, nausea, or vomiting.  Wt Readings from Last 5 Encounters:  06/11/24 176 lb (79.8 kg)  05/12/24 170 lb 3.2 oz (77.2 kg)  01/12/24 194 lb 0.1 oz (88 kg)  12/26/23 194 lb (88 kg)  07/16/23 198 lb 13.7 oz (90.2 kg)    Current Outpatient Medications  Medication Sig Dispense Refill   acetaminophen  (TYLENOL ) 325 MG tablet Take 650 mg by mouth every 6 (six) hours as needed for mild pain (pain score 1-3).     ALPRAZolam  (XANAX ) 0.5 MG tablet Take 1 tablet (0.5 mg total) by mouth at bedtime as needed for sleep (for use in the sleep lab, bring to the test at  the sleep lab, take in lab.). 2 tablet 0   aspirin  81 MG chewable tablet Chew 81 mg by mouth daily.     bisacodyl  (DULCOLAX) 5 MG EC tablet Take 5 mg by mouth daily as needed for moderate constipation.     brimonidine  (ALPHAGAN ) 0.2 % ophthalmic solution Place 1 drop into both eyes 3 (three) times daily. 5 mL 0   carboxymethylcellulose (REFRESH PLUS) 0.5 % SOLN Place 1 drop into both eyes every 6 (six) hours as needed (dry eyes).     clotrimazole-betamethasone (LOTRISONE) cream Apply 1 Application topically at bedtime.     dorzolamide -timolol  (COSOPT ) 22.3-6.8 MG/ML ophthalmic solution Place 1 drop into the right eye 2 (two) times daily. 10 mL 0   Emollient (EUCERIN) lotion Apply 1 Application topically every 12 (twelve) hours as needed for dry skin.  fenofibrate (TRICOR) 48 MG tablet Take 54 mg by mouth daily.     furosemide  (LASIX ) 20 MG tablet Take by mouth.     gabapentin  (NEURONTIN ) 100 MG capsule Take 1 capsule (100 mg total) by mouth 2 (two) times daily. 60 capsule 0   hydrocortisone  (ANUSOL -HC) 25 MG suppository Place 25 mg rectally every 8 (eight) hours as needed for hemorrhoids.     Menthol, Topical Analgesic, 5 % GEL Apply 1 application  topically 2 (two) times daily. Apply to bilateral knees topically twice a day     metFORMIN  (GLUCOPHAGE ) 1000 MG tablet Take 1 tablet (1,000 mg total) by mouth 2 (two) times daily with a meal. 60 tablet 0   midodrine  (PROAMATINE ) 5 MG tablet Take 1 tablet (5 mg total) by mouth 3 (three) times daily with meals.     mirtazapine (REMERON) 15 MG tablet Take 15 mg by mouth at bedtime.     polyethylene glycol (MIRALAX  / GLYCOLAX ) 17 g packet Take 17 g by mouth daily. Mix in 8 ounces of water . 14 each 0   ROCKLATAN  0.02-0.005 % SOLN Place 1 drop into both eyes at bedtime. 2.5 mL 0   rosuvastatin  (CRESTOR ) 40 MG tablet Take 40 mg by mouth at bedtime.     sitaGLIPtin  (JANUVIA ) 100 MG tablet Take 1 tablet (100 mg total) by mouth daily. 30 tablet 0    tamsulosin  (FLOMAX ) 0.4 MG CAPS capsule Take 1 capsule (0.4 mg total) by mouth daily. 30 capsule 0   traZODone (DESYREL) 50 MG tablet Take 50 mg by mouth at bedtime.     venlafaxine XR (EFFEXOR-XR) 75 MG 24 hr capsule Take 75 mg by mouth daily with breakfast.     No current facility-administered medications for this visit.    Past Medical History:  Diagnosis Date   Anemia    Asthma    Chronic kidney disease    Diabetes (HCC)    Glaucoma    Hemiparesis (HCC)    Hypercholesterolemia    Hyperlipemia    Melanoma (HCC)    Melanoma (HCC)    Obesity    OSA (obstructive sleep apnea)    Seizure (HCC)    Seizure disorder (HCC)    Sturge syndrome (HCC)    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HCC)     Past Surgical History:  Procedure Laterality Date   AMPUTATION Left 07/24/2019   Procedure: AMPUTATION RAY, fifth left;  Surgeon: Harden Jerona GAILS, MD;  Location: [facility] OR;  Service: Orthopedics;  Laterality: Left;   COLONOSCOPY     COLONOSCOPY WITH PROPOFOL  N/A 08/26/2023   Procedure: COLONOSCOPY WITH PROPOFOL ;  Surgeon: Cindie Carlin POUR, DO;  Location: [facility];  Service: Endoscopy;  Laterality: N/A;  12:30 pm, asa 3   CRANIOTOMY  1999   FLEXIBLE SIGMOIDOSCOPY  04/08/2023   Procedure: FLEXIBLE SIGMOIDOSCOPY;  Surgeon: Cindie Carlin POUR, DO;  Location: [facility];  Service: Endoscopy;;   melanoma removal  2011   POLYPECTOMY  08/26/2023   Procedure: POLYPECTOMY;  Surgeon: Cindie Carlin POUR, DO;  Location: [facility];  Service: Endoscopy;;    Family History  Problem Relation Age of Onset   Emphysema Father    Cancer Father    Asthma Mother     Allergies as of 06/11/2024 - Review Complete 06/11/2024  Allergen Reaction Noted   Penicillin g Anaphylaxis 03/06/2023    Social History   Socioeconomic History   Marital status: Single    Spouse name: Not on file   Number  of children: Not on file   Years of education: Not on file   Highest education level: Not on file  Occupational History    Occupation: [employer]    Comment: resides in a group home([facility])  Tobacco Use   Smoking status: Never   Smokeless tobacco: Never  Vaping Use   Vaping status: Never Used  Substance and Sexual Activity   Alcohol  use: Not Currently   Drug use: Not Currently   Sexual activity: Not Currently  Other Topics Concern   Not on file  Social History Narrative   Single.  Employed by [employer].  Never smoker. Has legal guardian.    Social Drivers of Corporate investment banker Strain: Low Risk  (10/25/2021)   Received from [facility]   Overall Financial Resource Strain (CARDIA)    Difficulty of Paying Living Expenses: Not hard at all  Food Insecurity: No Food Insecurity (10/25/2021)   Received from [facility]   Hunger Vital Sign    Within the past 12 months, you worried that your food would run out before you got the money to buy more.: Never true    Within the past 12 months, the food you bought just didn't last and you didn't have money to get more.: Never true  Transportation Needs: No Transportation Needs (10/25/2021)   Received from [facility] - Transportation    Lack of Transportation (Medical): No    Lack of Transportation (Non-Medical): No  Physical Activity: Unknown (10/25/2021)   Received from [facility]   Exercise Vital Sign    On average, how many days per week do you engage in moderate to strenuous exercise (like a brisk walk)?: 0 days    Minutes of Exercise per Session: Not on file  Recent Concern: Physical Activity - Inactive (10/25/2021)   Received from [facility]   Exercise Vital Sign    Days of Exercise per Week: 0 days    Minutes of Exercise per Session: 0 min  Stress: Stress Concern Present (10/25/2021)   Received from [facility] Occupational [facility] - Occupational Stress Questionnaire    Feeling of Stress : To some extent  Social Connections: Unknown (01/21/2022)   Received from [facility]   Social  Network    Social Network: Not on file  Recent Concern: Social Connections - Socially Isolated (10/25/2021)   Received from [facility]   Social Connection and Isolation Panel    In a typical week, how many times do you talk on the phone with family, friends, or neighbors?: Never    How often do you get together with friends or relatives?: Never    How often do you attend church or religious services?: Never    Do you belong to any clubs or organizations such as church groups, unions, fraternal or athletic groups, or school groups?: Yes    How often do you attend meetings of the clubs or organizations you belong to?: Never    Are you married, widowed, divorced, separated, never married, or living with a partner?: Never married     Review of Systems   Gen: + fatigue, + weight loss. Denies fever, chills, anorexia. Denies weakness.  CV: + chest pain. Denies palpitations, syncope, peripheral edema, and claudication. Resp: Denies dyspnea at rest, cough, wheezing, coughing up blood, and pleurisy. GI: See HPI Derm: Denies rash, itching, dry skin Psych: Denies depression, anxiety, memory loss, confusion. No homicidal or suicidal ideation.  Heme: Denies bruising, bleeding, and enlarged lymph nodes. + back pain.   Physical Exam   BP 129/76 (BP Location: Right Arm, Patient Position: Sitting, Cuff Size: Normal)   Pulse 79   Temp 98 F (36.7 C) (Temporal)   Wt 176 lb (79.8 kg)   BMI 30.21 kg/m   General:   Alert and oriented. No distress noted. Pleasant and cooperative.  Head:  Normocephalic and atraumatic. Eyes:  Conjuctiva clear without scleral icterus. Mouth:  Oral mucosa pink and moist.  Poor dentition. Lungs:  Clear to auscultation bilaterally. No wheezes, rales, or rhonchi. No distress.  Heart:  S1, S2 present without murmurs appreciated.  Abdomen:  +BS, soft, non-distended.  TTP to epigastrium and across lower abdomen.  No rebound or guarding. No HSM or masses noted. Rectal:  deferred Neurologic:  Alert and  oriented x4 Psych:  Alert and cooperative. Normal mood and affect.  Assessment & Plan  GEREMY RISTER is a 67 y.o. male presenting today with lower abdominal pain and back pain.     Chronic constipation Chronic constipation managed with Miralax  twice daily with bowel movements occurring three times in the morning, described as diarrhea that calms down by the afternoon. Could have overflow diarrhea as well. Current diarrhea episodes likely secondary to frequency of MiraLAX  and possibly some of his diabetes medications. Having lower abdominal pain as below, could be secondary to constipation. Will determine on CT scan if constipation is still an issue. - Reduce MiraLAX  to 17 g once daily with the option to add a as needed night dose if no bowel movement in 24 hours. - Continue Dulcolax as needed  Unintentional weight loss and Fatigue Unintentional weight loss of approximately 20 pounds since April without trying. Concern for underlying issues given the weight loss and abdominal symptoms. CT scan and blood work are planned to evaluate potential causes. Given history of anemia, rechecking anemia panel today. No reported melena or brbpr.  Colonoscopy up-to-date.  If needed can discuss upper endoscopy. - Order CT scan and chest x-ray to evaluate for potential causes of weight loss and abdominal pain. - Order blood work to assess for any underlying conditions contributing to weight loss. (CBC, CMP, TSH, CRP, ESR, anemia panel, hep C antibody) - Will plan to get most recent weights confirmed from the facility.   Lower abdominal pain and low back pain Intermittent sharp lower abdominal pain and low back pain, described as stabbing, lasting about a minute, occurring daily. Pain is not relieved by bowel movements. Tenderness noted in the lower abdominal area upon examination. Also having epigastric tenderness on exam. Differential includes gastrointestinal or musculoskeletal  causes, such as constipation, or reflux.  Unable to rule out colitis, enteritis, or malignancy. CT scan and blood work are planned to evaluate potential causes.  - Order CT scan to evaluate abdominal pain and rule out any underlying pathology. - Evaluate blood work results to further assess potential causes of abdominal and back pain.  - Given epigastric pain, starting on Pepcid  20 mg nightly in case of reflux related pain.     Chest Pain Described as sharp in nature even though short lasting, is not on a daily basis but has happened therefore will evaluate with an EKG.  Follow up   Follow up 8 weeks    Charmaine Melia, MSN, FNP-BC, AGACNP-BC [facility]

## 2024-06-11 ENCOUNTER — Encounter: Payer: Self-pay | Admitting: Gastroenterology

## 2024-06-11 ENCOUNTER — Telehealth: Payer: Self-pay | Admitting: *Deleted

## 2024-06-11 ENCOUNTER — Ambulatory Visit: Admitting: Gastroenterology

## 2024-06-11 VITALS — BP 129/76 | HR 79 | Temp 98.0°F | Wt 176.0 lb

## 2024-06-11 DIAGNOSIS — D649 Anemia, unspecified: Secondary | ICD-10-CM

## 2024-06-11 DIAGNOSIS — K5900 Constipation, unspecified: Secondary | ICD-10-CM

## 2024-06-11 DIAGNOSIS — M545 Low back pain, unspecified: Secondary | ICD-10-CM

## 2024-06-11 DIAGNOSIS — R079 Chest pain, unspecified: Secondary | ICD-10-CM

## 2024-06-11 DIAGNOSIS — R5383 Other fatigue: Secondary | ICD-10-CM

## 2024-06-11 DIAGNOSIS — K5904 Chronic idiopathic constipation: Secondary | ICD-10-CM

## 2024-06-11 DIAGNOSIS — R634 Abnormal weight loss: Secondary | ICD-10-CM | POA: Diagnosis not present

## 2024-06-11 DIAGNOSIS — K5909 Other constipation: Secondary | ICD-10-CM

## 2024-06-11 DIAGNOSIS — N189 Chronic kidney disease, unspecified: Secondary | ICD-10-CM

## 2024-06-11 DIAGNOSIS — R1030 Lower abdominal pain, unspecified: Secondary | ICD-10-CM

## 2024-06-11 MED ORDER — FAMOTIDINE 20 MG PO TABS: 20.0000 mg | ORAL_TABLET | Freq: Every day | ORAL | 3 refills | Status: AC

## 2024-06-11 NOTE — Telephone Encounter (Signed)
 LM for Belinda at [facility] to call back to get dates that would work for her for pt's CT and EKG

## 2024-06-11 NOTE — Telephone Encounter (Signed)
 Informed Xavier Turner at [facility] of EKG and CT appointment and she states that will just have the chest x-ray done on the same day since he will be at radiology.

## 2024-06-11 NOTE — Patient Instructions (Addendum)
 We will get you scheduled for a CT scan in the near future to evaluate your abdominal pain and your weight loss.   I have also ordered labs for you to have done as well.   Will start famotidine  20 mg nightly given epigastric tenderness today.  Given reports of multiple looser stools in the mornings, will reduce MiraLAX  to once daily with the option to give a second dose in the afternoon if no bowel movement in 24 hours or if evidence of straining.  Continue Dulcolax as needed.  Also recommend EKG given reports of chest pain.  It was a pleasure to see you today. I want to create trusting relationships with patients. If you receive a survey regarding your visit,  I greatly appreciate you taking time to fill this out on paper or through your MyChart. I value your feedback.  Charmaine Melia, MSN, FNP-BC, AGACNP-BC [facility]

## 2024-06-11 NOTE — Telephone Encounter (Signed)
 LM for Belinda to return call  EKG: Friday 07/03/24, arrive at 9:15 am to check in CT: Friday 07/03/24, arrive at 10:45 am to check in.  Can do chest x-ray on same day of other procedures or can have done at facility, just make sure to fax results.

## 2024-06-22 ENCOUNTER — Telehealth: Payer: Self-pay

## 2024-06-22 ENCOUNTER — Encounter

## 2024-06-22 NOTE — Telephone Encounter (Signed)
 Patient arrived via transportation company. No caregiver arrived with patient and patient said he did not know of any caregiver when asked. Driver had no other information, and when the transportation driver called the living facility where patient resides, they hung up on him both times. Driver then elected to take the patient back to the nursing facility. Patient was also not clean shaven, however the note sent with him documented his xanax  was taken. He had nothing with him and he appeared to be in soiled clothing. EMERSON Counts, RPSGT

## 2024-06-23 NOTE — Telephone Encounter (Signed)
 Patient was sent home last night 06/22/24 due to he did not have a sitter... I spoke with Belinda with [facility] to r/s his SS. She states she can not send him a sitter for him for the sleep study. She also stated that there is no family members that can help him. I asked her if he was able to get up on his own to use the restroom she stated he can.. I spoke with Meagan A and Dr. Chalice and they suggest him having a one on one with [facility] the sleep tech.   I faxed a new packet of information to [place].

## 2024-06-24 NOTE — Telephone Encounter (Signed)
 The xanax  prescription goes to [facility] ?

## 2024-06-24 NOTE — Telephone Encounter (Signed)
 Noted, thank you. I called Belinda with [facility] to informed her that another Xanax  has been order for his SS.

## 2024-06-24 NOTE — Telephone Encounter (Signed)
 Yes this is where you sent it last time.

## 2024-06-24 NOTE — Telephone Encounter (Signed)
 Belinda with [facility] just left a voicemail asking if a new prescription for Xanax  was going to be sent for him for his Sleep study that is scheduled for 07/27/2024.

## 2024-07-03 ENCOUNTER — Ambulatory Visit (HOSPITAL_COMMUNITY)
Admission: RE | Admit: 2024-07-03 | Discharge: 2024-07-03 | Disposition: A | Discharge status: Home / Self Care | Source: Ambulatory Visit | Attending: Gastroenterology | Admitting: Gastroenterology

## 2024-07-03 ENCOUNTER — Ambulatory Visit (HOSPITAL_COMMUNITY)

## 2024-07-03 DIAGNOSIS — R1030 Lower abdominal pain, unspecified: Secondary | ICD-10-CM | POA: Insufficient documentation

## 2024-07-03 DIAGNOSIS — K5904 Chronic idiopathic constipation: Secondary | ICD-10-CM | POA: Insufficient documentation

## 2024-07-03 LAB — POCT I-STAT CREATININE: Creatinine, Ser: 0.7 mg/dL (ref 0.61–1.24)

## 2024-07-03 MED ORDER — IOHEXOL 9 MG/ML PO SOLN
500.0000 mL | ORAL | Status: AC
  Administered 2024-07-03: 500 mL via ORAL

## 2024-07-03 MED ORDER — IOHEXOL 300 MG/ML  SOLN
100.0000 mL | Freq: Once | INTRAMUSCULAR | Status: AC | PRN
  Administered 2024-07-03: 100 mL via INTRAVENOUS

## 2024-07-10 ENCOUNTER — Ambulatory Visit: Payer: Self-pay | Admitting: Gastroenterology

## 2024-07-10 DIAGNOSIS — K7460 Unspecified cirrhosis of liver: Secondary | ICD-10-CM

## 2024-07-10 DIAGNOSIS — R748 Abnormal levels of other serum enzymes: Secondary | ICD-10-CM

## 2024-07-10 DIAGNOSIS — E611 Iron deficiency: Secondary | ICD-10-CM

## 2024-07-15 NOTE — Telephone Encounter (Signed)
 Please attempt to reach back out to facility to see if they have taken him for bloodwork or if it was drawn at the facility.

## 2024-07-22 NOTE — Addendum Note (Signed)
 Addended by: KENNEDY CHARMAINE CROME on: 07/22/2024 10:15 PM   Modules accepted: Orders

## 2024-07-22 NOTE — Telephone Encounter (Signed)
 Labs reviewed from [facility]. Evidence of iron deficiency with iron sat 10%, iron 31. TSH normal.   Alk phos elevated slightly at 137, normal AST/ALT, total bilirubin normal. Hgb 13.8, normal platelets.   Crp and sed rate, HCV antibody, ferritin, folate, and B12 levels not obtained. I need for them to complete these labs along with GGT, alk phos isoenzymes, ELF, AMA, IgG, IgM, IgA, ANA, ASMA.   Given elevated alk phos biliary colic remains potential cause of abdominal pain and he needs to be on a low fat and low sodium diet. Given possible cirrhosis and his weight loss he needs EGD, will see for follow up in a couple weeks and will plan to discuss with his sister.   Charmaine Melia, MSN, APRN, FNP-BC, AGACNP-BC [facility]

## 2024-07-27 ENCOUNTER — Ambulatory Visit (INDEPENDENT_AMBULATORY_CARE_PROVIDER_SITE_OTHER): Admitting: Neurology

## 2024-07-27 DIAGNOSIS — G4730 Sleep apnea, unspecified: Secondary | ICD-10-CM

## 2024-07-27 DIAGNOSIS — F79 Unspecified intellectual disabilities: Secondary | ICD-10-CM

## 2024-07-27 DIAGNOSIS — G4733 Obstructive sleep apnea (adult) (pediatric): Secondary | ICD-10-CM | POA: Diagnosis not present

## 2024-07-27 DIAGNOSIS — G4739 Other sleep apnea: Secondary | ICD-10-CM

## 2024-07-27 DIAGNOSIS — Q8589 Other phakomatoses, not elsewhere classified: Secondary | ICD-10-CM

## 2024-07-27 DIAGNOSIS — I69854 Hemiplegia and hemiparesis following other cerebrovascular disease affecting left non-dominant side: Secondary | ICD-10-CM

## 2024-07-27 DIAGNOSIS — Z8669 Personal history of other diseases of the nervous system and sense organs: Secondary | ICD-10-CM

## 2024-07-27 DIAGNOSIS — G8194 Hemiplegia, unspecified affecting left nondominant side: Secondary | ICD-10-CM

## 2024-08-06 ENCOUNTER — Telehealth: Payer: Self-pay | Admitting: *Deleted

## 2024-08-06 ENCOUNTER — Ambulatory Visit (INDEPENDENT_AMBULATORY_CARE_PROVIDER_SITE_OTHER): Admitting: Gastroenterology

## 2024-08-06 ENCOUNTER — Encounter: Payer: Self-pay | Admitting: Gastroenterology

## 2024-08-06 VITALS — BP 119/81 | HR 78 | Temp 97.9°F | Wt 176.0 lb

## 2024-08-06 DIAGNOSIS — K7460 Unspecified cirrhosis of liver: Secondary | ICD-10-CM

## 2024-08-06 DIAGNOSIS — R1011 Right upper quadrant pain: Secondary | ICD-10-CM

## 2024-08-06 MED ORDER — HYDROCORTISONE (PERIANAL) 2.5 % EX CREA: 1.0000 | TOPICAL_CREAM | Freq: Two times a day (BID) | CUTANEOUS | 1 refills | Status: AC

## 2024-08-06 NOTE — Telephone Encounter (Signed)
 Spoke with sister Holley and she gave consent for pt to have EGD done. Called [facility] and no answer and not able to leave VM, line rang numerous times  US  scheduled for 11/21, arrival 10:15am, npo midnight.  Also needs to schedule for EGD with Dr. Shaaron, ASA 3, see encounter form for med adjustments

## 2024-08-06 NOTE — Progress Notes (Unsigned)
 GI Office Note    Referring Provider: No ref. provider found Primary Care Physician:  Wilhelmina Cedar Primary Gastroenterologist: Carlin POUR. Cindie, DO  Date:  08/06/2024  ID:  Xavier Turner, DOB Jan 12, 1956, MRN [id number]   Chief Complaint   Chief Complaint  Patient presents with   Follow-up    Follow up. Having lower stomach pains    History of Present Illness  Xavier Turner is a 68 y.o. male with a history of diabetes, HLD, PVD, anemia of chronic disease, CKD, glaucoma, depression, OSA, GERD, Sturge-Weber syndrome presenting today with complaint of ongoing abdominal pain.  Colonoscopy in 2006 with Dr. Avram which was normal.  About an hour and a half after procedure he developed some abdominal discomfort and had some air trapping for which scope was reinserted and colon Decompressed.  He was recommended to have a colonoscopy in 10 years given he has a second-degree relative with colon cancer.   Echocardiogram in January 2024 with EF 60 to 65%.   Seen for initial consultation 03/06/2023.  Patient reported a couple episodes of BRBPR.  Had a normal-looking stool followed by a looser stool with blood present both times.  With some subsequent other bowel movements he has had a little bit of blood as well and some mild abdominal pain.  He reported a good appetite along with some mild rectal discomfort.  He denies any chest pain, shortness of breath, fatigue, or dizziness.  He had reported some history of rectal bleeding in his family as well.  Review of his chart indicated that he was recommended to have a colonoscopy in 2013 but does not appear that this was performed.  Rectal exam in the office did not reveal any mass.  FOBT in the office was positive.  Patient was advised to schedule colonoscopy and use Preparation H twice daily for 1 week.   Colonoscopy 04/08/2023: -Patient had an inadequate prep as there was stool in the rectum and sigmoid colon   OV 06/06/23.  Reported rectal bleeding  that morning with bowel movement.  Unable to tolerate taste of colonoscopy prep, given that he will most treatment 4 times he quit drinking.  Continuing to have rectal discomfort but denies abdominal pain.  Reportingly feeling like a fingernail and a scrape to his rectum, pain with bowel movements.  Having issues with bowel movements on a more regular basis despite daily bowel movement.  Spoke with patient's guardian who agreed with reattempt at colonoscopy.  Rescheduled.  Ordered compound hemorrhoid cream with lidocaine and nifedipine to treat for anal fissure.  Advised to start MiraLAX  daily along with Colace.   Colonoscopy was ultimately canceled given patient was hospitalized and then rescheduled for December 2024.   Colonoscopy December 2024: -4 mm sessile polyp in the transverse colon -6 mm sessile polyp in the rectum -Stool in entire examined colon -Nonbleeding internal hemorrhoids -Pathology revealed hyperplastic polyps -Repeat colonoscopy in 5 years   OV 12/26/23.  Reports intermittent constipation, denied need for frequent hygiene after bowel movements.  Denied any abdominal pain.  Having a good appetite.  Eye movement usually every other day but at times can go up to a week without a bowel movement.  Overall feeling pretty well.  Denies any chest pain or shortness of breath.  Last rectal bleeding episode was about 2 months prior after a big bowel movement.  Advised to check CBC and iron panel. Increase miralax  to BID. Dulcolax as needed. Avoid straining. Hemorrhoid cream as needed. F/u  in 4 months.  Last office visit 06/11/2024.  He reports significant sharp stabbing pain in the lower abdomen and lower back occurring daily usually in the mornings upon waking, usually only lasting about a minute at a time.  Pain severe enough to hinder her ability to get out of bed.  Experiencing diarrhea predominantly in the mornings occurring about 3 times before subsiding in the afternoon, currently on  MiraLAX  twice daily though unsure of the timing of his administration.  Concerned about potential for diarrhea the dosage increased or other things added to his regimen.  Noted about 20 pound weight loss since April without intention.  Appetite remains good and eating well per his aide and him.  Does report feeling tired and worn out most days despite not engaging in strenuous activities.  CT A/P with contrast 07/03/2024: - Hepatic cirrhosis and mild steatosis again noted - Tiny subcentimeter cyst in segment 3 of the left hepatic lobe, stable - Calcified gallstones without cholecystitis tubular dilation - Normal pancreas and spleen - No evidence of obstruction or inflammatory process within the stomach or bowel. - Mildly enlarged prostate  Labs have been received from [facility] in October: Evidence of iron deficiency with iron sat 10%, iron 31. TSH normal.  Alk phos elevated slightly at 137, normal AST/ALT, total bilirubin normal. Hgb 13.8, normal platelets.   Advised CRP and sed rate, HCV antibody, ferritin, folate, and B12 levels, GGT, alk phos isoenzymes, ELF, AMA, IgG, IgM, IgA, ANA, ASMA.   Advised previously a low-fat as well as a low-sodium diet given his possible cirrhosis.  Given his weight loss he needs an upper endoscopy, plans to discuss further with his sister.  Today:   Discussed the use of AI scribe software for clinical note transcription with the patient, who gave verbal consent to proceed.  He experiences significant abdominal pain that feels like a knot, primarily across the lower abdomen. The pain is sharp, sometimes radiating to his back, and is most pronounced when getting out of bed and attempting to stand up. It occurs mainly during movement, particularly when getting up from a lying position. pain is usually right side and across lower abdomen and is short lasting usually.   His bowel movements are regular, occurring every morning, though he sometimes experiences  difficulty. He has observed occasional bright red blood in his bowel movements, sometimes significant in volume. He reports that a colonoscopy in December found hemorrhoids. He is currently taking Miralax  once a day to manage his bowel movements and is concerned about taking additional laxatives due to fear of diarrhea.  No nausea, vomiting, or trouble swallowing. However, he reports a decreased appetite, feeling full quickly, and eating less than usual.      Wt Readings from Last 6 Encounters:  08/06/24 176 lb (79.8 kg)  06/11/24 176 lb (79.8 kg)  05/12/24 170 lb 3.2 oz (77.2 kg)  01/12/24 194 lb 0.1 oz (88 kg)  12/26/23 194 lb (88 kg)  07/16/23 198 lb 13.7 oz (90.2 kg)    Body mass index is 30.21 kg/m.   Current Outpatient Medications  Medication Sig Dispense Refill   acetaminophen  (TYLENOL ) 325 MG tablet Take 650 mg by mouth every 6 (six) hours as needed for mild pain (pain score 1-3).     ALPRAZolam  (XANAX ) 0.5 MG tablet Take 1 tablet (0.5 mg total) by mouth at bedtime as needed for sleep (for use in the sleep lab, bring to the test at the sleep lab, take in  lab.). 2 tablet 0   aspirin  81 MG chewable tablet Chew 81 mg by mouth daily.     bisacodyl  (DULCOLAX) 5 MG EC tablet Take 5 mg by mouth daily as needed for moderate constipation.     brimonidine  (ALPHAGAN ) 0.2 % ophthalmic solution Place 1 drop into both eyes 3 (three) times daily. 5 mL 0   carboxymethylcellulose (REFRESH PLUS) 0.5 % SOLN Place 1 drop into both eyes every 6 (six) hours as needed (dry eyes).     clotrimazole-betamethasone (LOTRISONE) cream Apply 1 Application topically at bedtime.     dorzolamide -timolol  (COSOPT ) 22.3-6.8 MG/ML ophthalmic solution Place 1 drop into the right eye 2 (two) times daily. 10 mL 0   Emollient (EUCERIN) lotion Apply 1 Application topically every 12 (twelve) hours as needed for dry skin.     famotidine  (PEPCID ) 20 MG tablet Take 1 tablet (20 mg total) by mouth at bedtime. 30 tablet 3    fenofibrate (TRICOR) 48 MG tablet Take 54 mg by mouth daily.     furosemide  (LASIX ) 20 MG tablet Take by mouth.     gabapentin  (NEURONTIN ) 100 MG capsule Take 1 capsule (100 mg total) by mouth 2 (two) times daily. 60 capsule 0   hydrocortisone  (ANUSOL -HC) 25 MG suppository Place 25 mg rectally every 8 (eight) hours as needed for hemorrhoids.     Menthol, Topical Analgesic, 5 % GEL Apply 1 application  topically 2 (two) times daily. Apply to bilateral knees topically twice a day     metFORMIN  (GLUCOPHAGE ) 1000 MG tablet Take 1 tablet (1,000 mg total) by mouth 2 (two) times daily with a meal. 60 tablet 0   midodrine  (PROAMATINE ) 5 MG tablet Take 1 tablet (5 mg total) by mouth 3 (three) times daily with meals.     mirtazapine (REMERON) 15 MG tablet Take 15 mg by mouth at bedtime.     polyethylene glycol (MIRALAX  / GLYCOLAX ) 17 g packet Take 17 g by mouth daily. Mix in 8 ounces of water . 14 each 0   ROCKLATAN  0.02-0.005 % SOLN Place 1 drop into both eyes at bedtime. 2.5 mL 0   rosuvastatin  (CRESTOR ) 40 MG tablet Take 40 mg by mouth at bedtime.     sitaGLIPtin  (JANUVIA ) 100 MG tablet Take 1 tablet (100 mg total) by mouth daily. 30 tablet 0   tamsulosin  (FLOMAX ) 0.4 MG CAPS capsule Take 1 capsule (0.4 mg total) by mouth daily. 30 capsule 0   traZODone (DESYREL) 50 MG tablet Take 50 mg by mouth at bedtime.     venlafaxine XR (EFFEXOR-XR) 75 MG 24 hr capsule Take 75 mg by mouth daily with breakfast.     No current facility-administered medications for this visit.    Past Medical History:  Diagnosis Date   Anemia    Asthma    Chronic kidney disease    Diabetes (HCC)    Glaucoma    Hemiparesis (HCC)    Hypercholesterolemia    Hyperlipemia    Melanoma (HCC)    Melanoma (HCC)    Obesity    OSA (obstructive sleep apnea)    Seizure (HCC)    Seizure disorder (HCC)    Sturge syndrome (HCC)    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HCC)     Past Surgical History:  Procedure Laterality Date   AMPUTATION Left  07/24/2019   Procedure: AMPUTATION RAY, fifth left;  Surgeon: Harden Jerona GAILS, MD;  Location: [facility] OR;  Service: Orthopedics;  Laterality: Left;   COLONOSCOPY     COLONOSCOPY  WITH PROPOFOL  N/A 08/26/2023   Procedure: COLONOSCOPY WITH PROPOFOL ;  Surgeon: Cindie Carlin POUR, DO;  Location: [facility] SUITE;  Service: Endoscopy;  Laterality: N/A;  12:30 pm, asa 3   CRANIOTOMY  1999   FLEXIBLE SIGMOIDOSCOPY  04/08/2023   Procedure: FLEXIBLE SIGMOIDOSCOPY;  Surgeon: Cindie Carlin POUR, DO;  Location: [facility];  Service: Endoscopy;;   melanoma removal  2011   POLYPECTOMY  08/26/2023   Procedure: POLYPECTOMY;  Surgeon: Cindie Carlin POUR, DO;  Location: [facility];  Service: Endoscopy;;    Family History  Problem Relation Age of Onset   Emphysema Father    Cancer Father    Asthma Mother     Allergies as of 08/06/2024 - Review Complete 08/06/2024  Allergen Reaction Noted   Penicillin g Anaphylaxis 03/06/2023    Social History   Socioeconomic History   Marital status: Single    Spouse name: Not on file   Number of children: Not on file   Years of education: Not on file   Highest education level: Not on file  Occupational History   Occupation: [employer]    Comment: resides in a group home([facility])  Tobacco Use   Smoking status: Never   Smokeless tobacco: Never  Vaping Use   Vaping status: Never Used  Substance and Sexual Activity   Alcohol  use: Not Currently   Drug use: Not Currently   Sexual activity: Not Currently  Other Topics Concern   Not on file  Social History Narrative   Single.  Employed by [employer].  Never smoker. Has legal guardian.    Social Drivers of Corporate Investment Banker Strain: Low Risk  (10/25/2021)   [facility]   Overall Financial Resource Strain (CARDIA)    Difficulty of Paying Living Expenses: Not hard at all  Food Insecurity: No Food Insecurity (10/25/2021)   [facility]   Hunger Vital Sign    Within  the past 12 months, you worried that your food would run out before you got the money to buy more.: Never true    Within the past 12 months, the food you bought just didn't last and you didn't have money to get more.: Never true  Transportation Needs: No Transportation Needs (10/25/2021)   Received [facility] - [facility]    Lack of Transportation (Medical): No    Lack of Transportation (Non-Medical): No  Physical Activity: Unknown (10/25/2021)   Received from [facility]   Exercise Vital Sign    On average, how many days per week do you engage in moderate to strenuous exercise (like a brisk walk)?: 0 days    Minutes of Exercise per Session: Not on file  Recent Concern: Physical Activity - Inactive (10/25/2021)   Received from [facility]   Exercise Vital Sign    Days of Exercise per Week: 0 days    Minutes of Exercise per Session: 0 min  Stress: Stress Concern Present (10/25/2021)   Received from [facility] - Occupational Stress Questionnaire    Feeling of Stress : To some extent  Social Connections: Socially Isolated (10/25/2021)   Received from [facility]   Social Connection and Isolation Panel    In a typical week, how many times do you talk on the phone with family, friends, or neighbors?: Never    How often do you get together with friends or relatives?: Never    How  often do you attend church or religious services?: Never    Do you belong to any clubs or organizations such as church groups, unions, fraternal or athletic groups, or school groups?: Yes    How often do you attend meetings of the clubs or organizations you belong to?: Never    Are you married, widowed, divorced, separated, never married, or living with a partner?: Never married    Review of Systems   Gen: + weight loss. Denies fever, chills, anorexia. Denies fatigue, weakness CV: Denies chest pain, palpitations, syncope, peripheral edema, and  claudication. Resp: + shortness of breath. Denies cough, wheezing, coughing up blood, and pleurisy. GI: See HPI Derm: + dry skin.  Denies rash, itching Psych: Denies depression, anxiety, memory loss, confusion. No homicidal or suicidal ideation.  Heme: Denies bruising, bleeding, and enlarged lymph nodes.  Physical Exam   BP 119/81 (BP Location: Right Arm, Patient Position: Sitting, Cuff Size: Normal)   Pulse 78   Temp 97.9 F (36.6 C) (Temporal)   Wt 176 lb (79.8 kg)   BMI 30.21 kg/m   General:   Alert and oriented. No distress noted. Pleasant and cooperative.  Head:  Normocephalic.  Bruising/purple flat lesions noted to right side of face Eyes:  Conjuctiva clear without scleral icterus to left eye, unable to visualize right eye. Mouth:  Oral mucosa pink and moist.  Poor dentition.. No lesions. Lungs:  Clear to auscultation bilaterally. No wheezes, rales, or rhonchi. No distress.  Heart:  S1, S2 present without murmurs appreciated.  Abdomen:  +BS, soft, mild distention.  Palpable liver border.  Moderate TTP to RUQ and epigastric region. Small void to RUQ, questionable hernia. No rebound or guarding. Rectal: deferred Msk:  Symmetrical without gross deformities. Normal posture. Extremities:  Without edema. Neurologic:  Alert and  oriented x4 Psych:  Alert and cooperative. Normal mood and affect.  Assessment & Plan  Xavier Turner is a 68 y.o. male presenting today with ongoing abdominal pain.    Right-sided upper abdominal pain with decreased appetite and early satiety, recent weight loss Intermittent right-sided upper abdominal pain with decreased appetite and early satiety. Recent weight loss. CT scan showed no significant findings to explain the pain. Pain likely referred from back issues. No evidence of intestinal obstruction or cancer on CT scan and evidence of gallstones. Has TTP on exam today and some abnormal distention to RUQ and palpable liver border. Possible abdominal wall  pain, some void in his area concerning for possible hernia. Weight stable currently without further weight loss, drinking boost. Symptoms could be biliary colic but does not seem to have relationship to meals.  - Ordered additional lab work to assess for underlying causes of weight loss and decreased appetite that was not previously completed.  - Will discuss with sister, Sharlet, about scheduling an upper endoscopy to evaluate for esophagitis, gastritis, h. pylori, duodenitis, peptic ulcer disease, malignancy) - Continue famotidine , may need to consider PPI.  - Low fat diet advised.   Suspected liver cirrhosis CT scan showed liver fibrosis with fat deposits, raising suspicion for cirrhosis. Previously advised checking additional serologies which have not yet been completed, will print lab slips again today. Normal spleen. No prior thrombocytopenia.  - Ordered additional lab work to assess liver function and etiology such as autoimmune and viral and fibrosis labs.  - RUQ US  now to further assess liver now and evaluate RUQ tenderness.  - 2g sodium diet - Will need regular monitoring with US  and MELD labs.  Hemorrhoids with intermittent rectal bleeding Intermittent rectal bleeding, sometimes bright red, likely due to hemorrhoids. Previous colonoscopy in December showed hemorrhoids. Bleeding is not constant, suggesting hemorrhoids as the source. - Prescribed additional hemorrhoid cream for symptomatic relief, apply twice daily for 1 week then as needed.  Back pain with morning stiffness and referred abdominal pain Back pain with morning stiffness, likely contributing to referred abdominal pain. Pain exacerbated by getting out of bed and using the bathroom. No evidence of intestinal issues on CT scan. - Referred to primary care physician for further evaluation of back pain. - Advised raising the bed to reduce strain when getting out of bed.  Chronic constipation Currently doing fairly well with  MiraLAX  once daily with an additional dose in the evening as needed. Will continue this for now. may consider increasing to scheduled if ineffective emptying and ongoing abdominal pain.      Procedure: Proceed with upper endoscopy with propofol  by Dr. Cindie in near future: the risks, benefits, and alternatives have been discussed with the patient in detail. The patient states understanding and desires to proceed. ASA 3  Hold metformin  and Januvia  morning of procedure  Hold metformin  night prior to procedure   Follow up   Follow up 3 months   Charmaine Melia, MSN, FNP-BC, AGACNP-BC [facility]

## 2024-08-06 NOTE — Patient Instructions (Signed)
 We will get you scheduled for upper endoscopy to further evaluate your decrease in appetite, weight loss, and upper abdominal pain.  We will get consent from your sister Sharlet.  Continue MiraLAX  and famotidine .  For rectal bleeding give Anusol  twice daily for 1 week.  Please complete labs and send results to the office.   We will get you scheduled for an ultrasound also to further evaluate your upper abdominal pain on the right side.  Follow-up in 3 months.  It was a pleasure to see you today. I want to create trusting relationships with patients. If you receive a survey regarding your visit,  I greatly appreciate you taking time to fill this out on paper or through your MyChart. I value your feedback.  Charmaine Melia, MSN, FNP-BC, AGACNP-BC [facility]

## 2024-08-07 ENCOUNTER — Ambulatory Visit: Payer: Self-pay | Admitting: Neurology

## 2024-08-07 ENCOUNTER — Encounter: Payer: Self-pay | Admitting: *Deleted

## 2024-08-07 DIAGNOSIS — G4733 Obstructive sleep apnea (adult) (pediatric): Secondary | ICD-10-CM

## 2024-08-07 DIAGNOSIS — G8194 Hemiplegia, unspecified affecting left nondominant side: Secondary | ICD-10-CM

## 2024-08-07 DIAGNOSIS — Q8589 Other phakomatoses, not elsewhere classified: Secondary | ICD-10-CM

## 2024-08-07 DIAGNOSIS — F79 Unspecified intellectual disabilities: Secondary | ICD-10-CM

## 2024-08-07 DIAGNOSIS — G40209 Localization-related (focal) (partial) symptomatic epilepsy and epileptic syndromes with complex partial seizures, not intractable, without status epilepticus: Secondary | ICD-10-CM

## 2024-08-07 MED ORDER — ALPRAZOLAM 0.5 MG PO TABS: 0.5000 mg | ORAL_TABLET | Freq: Every evening | ORAL | 0 refills | Status: AC | PRN

## 2024-08-07 NOTE — Telephone Encounter (Signed)
 Spoke to [place] from [facility] informed her of US  appointment, EGD scheduled for 09/02/24. Instructions faxed ti [phone].

## 2024-08-07 NOTE — Procedures (Signed)
 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 A SPLIT study was last performed in 2013 , the result was a dx of severe sleep apnea at an AHI of 114/h (!) .At the time, BMI was 37 and ESS endorsed at 18/ 24 points. He had been titrated to BiPAP 14/ 8 cm water   and reportedly could not tolerate the machine noise nor the headgear. When last seen, he lived with his mother, meanwhile lives in an institution. He has thick facial hair. he was likely unable to use PAP unassisted also due to dexterity issues.    The current  Epworth Sleepiness Scale was endorsed at 16 out of 24 (scores above or equal to 10 are suggestive of hypersomnolence). FSS at 27/63 points. BMI now 29.   DESCRIPTION: A sleep technologist was in attendance for the duration of the recording.  Data collection, scoring, video monitoring, and reporting were performed in compliance with the AASM Manual for the Scoring of Sleep and Associated Events; (Hypopnea is scored based on the criteria listed in Section VIII D. 1b in the AASM Manual V2.6 using a 4% oxygen desaturation rule or Hypopnea is scored based on the criteria listed in Section VIII D. 1a in the AASM Manual V2.6 using 3% oxygen desaturation and /or arousal rule).  A Board certified physician ( Sleep Medicine ) reviewed each epoch of the study.  ADDITIONAL INFORMATION: Patient arrived via wheelchair, unaccompanied, for a SPLIT night titration polysomnogram  (split at 30 events per hour). Patient currently NOT using prescribed BiPAP at 14/8cm, primarily complaints of noise preventing its use. Patient was prescribed Xanax  for this procedure as well. Patient reports not taking or getting any Xanax  tonight. Patient did not bring any change of clothes, no personal hygiene products and no Depends undergarments. Sitter was to be present to provide assistance overnight for this procedure, however, no caregiver showed up. Patient chose to use the AirFit F40 if needed.  Height: 64.0 in Weight: 170 lb (BMI 29) Neck Size: 20.0 in Medications: Tylenol , Xanax , Aspirin , Dulcolax, Alphagan , Refresh Plus eye drops, Lotrisone, Cosopt , Eucerin, Tricor, Lasix , Neurontin , Anusol -HC, Menthol topical gel, Glucophage , ProAmatine , Remeron, Miralax , Rocklatan  eye drops, Crestor , Januvia , Flomax , Desyrel, Effexor-XR  STUDY DETAILS: Lights off was at 21:30: and lights on 03:42: (372 minutes hours in bed). This study was performed with an initial diagnostic portion followed by positive airway pressure titration.  An expanded EEG montage was applied.   DIAGNOSTIC PART ONE:  SLEEP CONTINUITY AND SLEEP ARCHITECTURE:  The diagnostic portion of the study began at 21:30 and ended at 00:44, for a recording time of 3h 14.75m minutes.  Total sleep time was 131 minutes (100.0% supine; 0.0% lateral;  0.0% prone, 0.0% REM sleep), with a decreased sleep efficiency at 67.5%.  Sleep latency was extremely short: 1.0 minutes. Void of REM sleep.  Arousal index was 97.1 /hr. Of the total  sleep time, the percentage of stage N1 sleep was 6.5%, stage N2 sleep was 72.1%, stage N3 sleep was 21.4%, and REM sleep was 0.0%. There were 0 Stage R periods observed during this portion of the study, 44 awakenings (i.e. transitions to Stage W from any sleep stage), and 127.0 total stage transitions. Wake after sleep onset (WASO) time accounted for 62 minutes. AROUSAL (Baseline): There were 76.9 arousals/hour.   Of these,  94.0 were identified as respiratory-related arousals (43.1 /h), 0 were PLM-related arousals (0.0 /h), and 118 were non-specific arousals.  RESPIRATORY MONITORING:  Based on CMS criteria (using a 4% oxygen desaturation rule for scoring hypopneas), there were 224 apneas (132 obstructive; 82 central; 10 mixed), and 28 hypopneas.  The Apnea index was 62.7/h ( COMPLEX APNEA) . Hypopnea index was 4.6/h. The AHI (apnea-hypopnea index) was 67.3/h overall (67.3 supine). There were 0 respiratory effort-related arousals (RERAs).   LIMB MOVEMENTS: There were 0 periodic limb movements of sleep (0.0/hr), of which 0 (0.0/hr) were associated with an arousal. OXIMETRY:  Respiratory events were associated with oxyhemoglobin desaturations to a nadir at 77% from a normal baseline (mean 96%). Total time spent below 89% was 19.6 minutes, or 15.0% of total sleep time.  BODY POSITION: Duration of total sleep and percent of total sleep in their respective position is as follows: supine 131 minutes = 100%   EKG: Highest heart rate for the baseline portion of the study was 97.0 beats per minute.  The average heart rate during sleep was 74 bpm, while the lowest heart rate for the same period was 64 bpm.    TREATMENT  STUDY- PART TWO:  SLEEP CONTINUITY AND SLEEP ARCHITECTURE:  The treatment portion of the study began at 00:44 and ended at 03:42, for a recording time of 2h 58.51m minutes.  The patient had chosen a mask:  an Airfit F 40 Large .  CPAP therapy started at at 5 cm water  and was explored up to 13 cm water  without gaining control of the severe COMPLEX sleep apnea-   the technologist switched to BPAP , starting at 16/12 cm water  pressure setting with a final pressure of 18/ 14 cm water  , and without success.     Total sleep time was 131 minutes minutes (100.0% supine;  0.0% lateral; 0.0% prone, 0.0% REM sleep), with a decreased sleep efficiency at 73.7%. Sleep latency was decreased at 0.5 minutes. REM sleep latency was  decreased at 0.0 minutes.  Arousal index was 44.7 /hr. Of the total sleep time, the percentage of stage N1 sleep was 6.5%, stage N3 sleep was 18.3%, and REM sleep was 0.0%. There were 0 Stage R periods observed during this portion of the study, 25 awakenings (i.e. transitions to Stage W from any sleep stage), and 97.0 total stage transitions. Wake after sleep onset (WASO) time accounted now for 46 minutes.   AROUSAL: There were still many arousals/hour.  Of these, 29.0 were identified as respiratory-related arousals (13.2 /hr), 2 were PLM-related arousals (0.9 /hr), and the majority ( 65)  were non-specific arousals (29.7 /hr).  RESPIRATORY MONITORING:    While on PAP therapy, based on CMS criteria, the AHI ( ) apnea-hypopnea index was 47.9/h overall (47.9 in all supine sleep; without REM sleep). Total AHI under PAP therapy was differentiated as follows:  47 obstructive apneas, 8 mixed, 32 central apneas  and 30 hypopneas.     Under the last explored BiPAP pressure, the AHI was 50.53/h, consistent  of one obstructive apnea ,  6 central apneas and 1 hypopnea.  Sleep remained highly fragmented. Whenever the patient's sleep was less fragmented , PLMs were arising.   There were 33 periodic limb movements of sleep (15.1/hr), of which 2 (0.9/hr) were associated with an arousal.   OXIMETRY: Total sleep time spent at, or below 88% was now reduce to 12.3 minutes, or 9.4% of total sleep time.    EKG:  The recoring documented a regular rhythm  and showed the highest asleep heart rate for the treatment portion of the study was 93.0 beats per minute.  The average heart rate during sleep was 69 bpm, while the lowest heart rate for the same period was 60 bpm.   EEG : large amplitude asymmetric PSG expanded montage EEG . (I will order an EEG to follow )    Recommended Settings:  none     IMPRESSION: The patient presented with severe and complex sleep apnea , dominantly obstructive type.  The result of the  titration study was unsatisfying: The AHI was somewhat reduced under CPAP pressures of 11 and 13 cm water , ( just under 30/h)  while BiPAP therapy failed to reduce the AHI to under 40/h.   There was  clinically significant  hypoxia seen which improved marginally under PAP. There was a cluster of limb movements noted few related arousals.       RECOMMENDATIONS: I like to have another sleep study with Xavier Turner, but only if his home can prepare him adequately for this visit :      The patient has to be shaven ( or the beard has to be at least trimmed, nails trimmed).  The patient needs to be accompanied by a sitter and has to bring a change of clothing, incontinence- underwear, and toiletries ( essentials) The sleep aid ( xanax  prescription has to be filled) has to be brought to the sleep lab (!!!) at the time of arrival.     I want Xavier Turner to return to the sleep lab, earliest arrival time, to undergo a PAP titration starting with CPAP at 10 cm water  , and explore to 20 cm water . If not beneficial , lets start BiPAP and explore from there if he needs ASV .    DEDRA GORES,  MD             [facility] CPAP/Bilevel Report    General Information  Name: Xavier Turner, Xavier Turner BMI: 29 Physician: Dedra Gores, MD for Dr Okey, MD , ENT   ID: [id number] Height: 64 in Technician: Sheena Cough  Sex: Male Weight: 170 lb Record: [id number]  Age: 60 [06/29/1956] Date: 07/27/2024 Scorer: Cough Sheena   Recommended Settings IPAP: N/A cmH20 EPAP: N/A cmH2O AHI: N/A AHI (4%): N/A   Pressure IPAP/EPAP 00 05 07 09 11 16 / 12 13 17  / 13   O2 Vol 0.0 0.0 0.0 0.0 0.0 0.0 0.0 0.0  Time TRT 204.69m 15.41m 12.20m 13.40m 32.63m 44.64m 16.29m 12.37m   TST 137.55m 10.16m 9.65m 13.36m 32.21m 26.75m 15.11m 9.15m  Sleep Stage % Wake 32.8 33.3 25.0 0.0 0.0 40.9 3.1 24.0   % REM 0.0 0.0 0.0 0.0 0.0 0.0 0.0 0.0   % N1 6.2 30.0 11.1 0.0 0.0 7.7 3.2 15.8   % N2 73.4 70.0  88.9 51.9 67.7 78.8 74.2 84.2   % N3 20.4 0.0 0.0 48.1 32.3 13.5 22.6 0.0  Respiratory Total Events 160 13 13 17 24 19 9 8    Obs.  Apn. 85 7 9 5 11 10 2 2    Mixed Apn. 8 0 0 0 0 1 0 1   Cen. Apn. 50 5 3 5 3 4 2 4    Hypopneas 17 1 1 7 10 4 5 1    AHI 70.07 78.00 86.67 75.56 44.31 43.85 34.84 50.53   Supine AHI 70.07 78.00 86.67 75.56 44.31 43.85 34.84 50.53   Prone AHI 0.00 0.00 0.00 0.00 0.00 0.00 0.00 0.00   Side AHI 0.00 0.00 0.00 0.00 0.00 0.00 0.00 0.00  Respiratory (4%) Hypopneas (4%) 10.00 1.00 1.00 7.00 2.00 2.00 3.00 1.00   AHI (4%) 67.01 78.00 86.67 75.56 29.54 39.23 27.10 50.53   Supine AHI (4%) 67.01 78.00 86.67 75.56 29.54 39.23 27.10 50.53   Prone AHI (4%) 0.00 0.00 0.00 0.00 0.00 0.00 0.00 0.00   Side AHI (4%) 0.00 0.00 0.00 0.00 0.00 0.00 0.00 0.00  Desat Profile <= 90% 33.68m 2.40m 4.58m 5.67m 0.7m 12.62m 2.52m 0.68m   <= 80% 9.41m 0.79m 0.71m 0.54m 0.61m 2.44m 2.49m 0.18m   <= 70% 8.43m 0.75m 0.66m 0.57m 0.11m 2.36m 2.79m 0.27m   <= 60% 8.13m 0.32m 0.75m 0.66m 0.52m 2.45m 2.82m 0.30m  Arousal Index Apnea 37.7 30.0 46.7 8.9 0.0 9.2 0.0 6.3   Hypopnea 5.3 6.0 0.0 4.4 0.0 2.3 7.7 0.0   LM 0.0 0.0 0.0 4.4 3.7 2.3 0.0 0.0   Spontaneous 53.4 48.0 13.3 8.9 5.5 50.8 38.7 31.6   Pressure IPAP/EPAP 18 / 14   O2 Vol 0.0  Time TRT 23.85m   TST 9.22m  Sleep Stage % Wake 58.7   % REM 0.0   % N1 5.3   % N2 94.7   % N3 0.0  Respiratory Total Events 8   Obs. Apn. 1   Mixed Apn. 0   Cen. Apn. 6   Hypopneas 1   AHI 50.53   Supine AHI 50.53   Prone AHI 0.00   Side AHI 0.00  Respiratory (4%) Hypopneas (4%) 1.00   AHI (4%) 50.53   Supine AHI (4%) 50.53   Prone AHI (4%) 0.00   Side AHI (4%) 0.00  Desat Profile <= 90% 3.41m   <= 80% 3.85m   <= 70% 3.77m   <= 60% 3.33m  Arousal Index Apnea 6.3   Hypopnea 0.0   LM 0.0   Spontaneous 63.2

## 2024-08-14 ENCOUNTER — Ambulatory Visit (HOSPITAL_COMMUNITY)
Admission: RE | Admit: 2024-08-14 | Discharge: 2024-08-14 | Disposition: A | Discharge status: Home / Self Care | Source: Ambulatory Visit | Attending: Gastroenterology | Admitting: Gastroenterology

## 2024-08-14 DIAGNOSIS — K7460 Unspecified cirrhosis of liver: Secondary | ICD-10-CM | POA: Insufficient documentation

## 2024-08-14 DIAGNOSIS — R1011 Right upper quadrant pain: Secondary | ICD-10-CM | POA: Diagnosis present

## 2024-08-16 ENCOUNTER — Ambulatory Visit: Payer: Self-pay | Admitting: Gastroenterology

## 2024-08-18 ENCOUNTER — Ambulatory Visit: Admitting: Podiatry

## 2024-08-18 ENCOUNTER — Encounter: Payer: Self-pay | Admitting: Podiatry

## 2024-08-18 DIAGNOSIS — L84 Corns and callosities: Secondary | ICD-10-CM

## 2024-08-18 DIAGNOSIS — E1169 Type 2 diabetes mellitus with other specified complication: Secondary | ICD-10-CM

## 2024-08-18 DIAGNOSIS — M79675 Pain in left toe(s): Secondary | ICD-10-CM

## 2024-08-18 DIAGNOSIS — M79674 Pain in right toe(s): Secondary | ICD-10-CM

## 2024-08-18 DIAGNOSIS — B351 Tinea unguium: Secondary | ICD-10-CM | POA: Diagnosis not present

## 2024-08-18 DIAGNOSIS — E785 Hyperlipidemia, unspecified: Secondary | ICD-10-CM

## 2024-08-18 NOTE — Progress Notes (Signed)
  Subjective:  Patient ID: Xavier Turner, male    DOB: 1956-09-13,  MRN: [id number]  Chief Complaint  Patient presents with   [facility]    Rm1 Diabetic foot care    68 y.o. male returns with the above complaint. History confirmed with patient.  Here today for at risk diabetic foot care his nails are thickened elongated and causing pain again.  Callus has returned and is painful as well into different sites on the outside of the left foot.  No other new foot or other medical issues  Objective:  Physical Exam: warm, good capillary refill, no ulcerative lesions, normal DP and PT pulses, and abnormal sensory exam.  He has severe pes cavus foot deformity.  Prominent area of tenderness over the fifth metatarsal base plantarly.  Hyperkeratotic painful callus here.  Additional painful hyperkeratotic callus on the fourth metatarsal head thickened elongated nails with yellow discoloration and subungual debris of all remaining toenails.  Previous partial fifth ray resection   Assessment:   1. Callus of foot   2. Pain due to onychomycosis of toenails of both feet   3. Type 2 diabetes mellitus with hyperlipidemia (HCC)          Plan:  Patient was evaluated and treated and all questions answered.     Discussed the etiology and treatment options for the condition in detail with the patient.  Recommended debridement of the nails today. Sharp and mechanical debridement performed of all painful and mycotic nails today. Nails debrided in length and thickness using a nail nipper to level of comfort. Discussed treatment options including appropriate shoe gear. Follow up as needed for painful nails.  All symptomatic hyperkeratoses were safely debrided with a sterile #15 blade to patient's level of comfort without incident. We discussed preventative and palliative care of these lesions including supportive and accommodative shoegear, padding, prefabricated and custom molded accommodative orthoses, use of a  pumice stone and lotions/creams daily.  Return in about 4 months (around 12/16/2024) for at risk diabetic foot care.

## 2024-08-28 ENCOUNTER — Encounter (HOSPITAL_COMMUNITY)
Admission: RE | Admit: 2024-08-28 | Discharge: 2024-08-28 | Disposition: A | Source: Ambulatory Visit | Attending: Internal Medicine

## 2024-08-28 NOTE — Pre-Procedure Instructions (Signed)
 Pre-op phone call done with sister, Sharlet Ku, [place]. States he is unable to sign well. I obtained telephone consent with Dwayne Seip, RN. I also faxed information to [facility] where patient is a resident.

## 2024-08-28 NOTE — Patient Instructions (Signed)
    Xavier Turner  08/28/2024     @PREFPERIOPPHARMACY @                  We have obtained verbal consent from Xavier Turner for his procedure.                   Please bring a copy of his MAR with meds taken the morning of the procedure documented so we will know what he takes that morning as well as the last dose taken of his other medications.      Your procedure is scheduled on 09/02/2024.    Report to Xavier Turner at  0715 A.M.   Call this number if you have problems the morning of surgery:  [phone]  If you experience any cold or flu symptoms such as cough, fever, chills, shortness of breath, etc. between now and your scheduled surgery, please notify us  at the above number.   Remember:             NO DIABETIC medications the morning of your procedure.   Do not eat or drink after midnight.       Take these medicines the morning of surgery with A SIP OF WATER                     gabapentin , midodrine , tamsulosin , effexor.    Do not wear jewelry, make-up or nail polish, including gel polish,  artificial nails, or any other type of covering on natural nails (fingers and  toes).  Do not wear lotions, powders, or perfumes, or deodorant.  Do not shave 48 hours prior to surgery.  Men may shave face and neck.  Do not bring valuables to the hospital.  [facility] is not responsible for any belongings or valuables.  Contacts, dentures or bridgework may not be worn into surgery.  Leave your suitcase in the car.  After surgery it may be brought to your room.  For patients admitted to the hospital, discharge time will be determined by your treatment team.  Patients discharged the day of surgery will not be allowed to drive home.

## 2024-09-02 ENCOUNTER — Encounter (HOSPITAL_COMMUNITY): Payer: Self-pay | Admitting: Internal Medicine

## 2024-09-02 ENCOUNTER — Ambulatory Visit (HOSPITAL_COMMUNITY): Admitting: Anesthesiology

## 2024-09-02 ENCOUNTER — Ambulatory Visit (HOSPITAL_COMMUNITY)
Admission: RE | Admit: 2024-09-02 | Discharge: 2024-09-02 | Disposition: A | Attending: Internal Medicine | Admitting: Internal Medicine

## 2024-09-02 ENCOUNTER — Encounter (HOSPITAL_COMMUNITY): Admission: RE | Disposition: A | Payer: Self-pay | Source: Home / Self Care | Attending: Internal Medicine

## 2024-09-02 DIAGNOSIS — K3189 Other diseases of stomach and duodenum: Secondary | ICD-10-CM | POA: Diagnosis not present

## 2024-09-02 DIAGNOSIS — K766 Portal hypertension: Secondary | ICD-10-CM

## 2024-09-02 DIAGNOSIS — I10 Essential (primary) hypertension: Secondary | ICD-10-CM

## 2024-09-02 DIAGNOSIS — E119 Type 2 diabetes mellitus without complications: Secondary | ICD-10-CM

## 2024-09-02 HISTORY — PX: ESOPHAGOGASTRODUODENOSCOPY: SHX5428

## 2024-09-02 LAB — GLUCOSE, CAPILLARY: Glucose-Capillary: 113 mg/dL — ABNORMAL HIGH (ref 70–99)

## 2024-09-02 SURGERY — EGD (ESOPHAGOGASTRODUODENOSCOPY)
Anesthesia: Monitor Anesthesia Care

## 2024-09-02 MED ORDER — LACTATED RINGERS IV SOLN: INTRAVENOUS | Status: DC

## 2024-09-02 MED ORDER — LIDOCAINE 2% (20 MG/ML) 5 ML SYRINGE
INTRAMUSCULAR | Status: DC | PRN
  Administered 2024-09-02: 50 mg via INTRAVENOUS

## 2024-09-02 MED ORDER — PROPOFOL 10 MG/ML IV BOLUS
INTRAVENOUS | Status: DC | PRN
  Administered 2024-09-02: 120 mg via INTRAVENOUS
  Administered 2024-09-02: 30 mg via INTRAVENOUS
  Administered 2024-09-02: 10 mg via INTRAVENOUS

## 2024-09-02 NOTE — Anesthesia Postprocedure Evaluation (Signed)
 Anesthesia Post Note  Patient: Xavier Turner  Procedure(s) Performed: EGD (ESOPHAGOGASTRODUODENOSCOPY)  Patient location during evaluation: Short Stay Anesthesia Type: MAC Level of consciousness: awake and alert Pain management: pain level controlled Vital Signs Assessment: post-procedure vital signs reviewed and stable Respiratory status: spontaneous breathing Cardiovascular status: blood pressure returned to baseline Postop Assessment: no apparent nausea or vomiting Anesthetic complications: no   No notable events documented.   Last Vitals:  Vitals:   09/02/24 0732 09/02/24 0939  BP: (!) 110/58 (!) 117/54  Pulse:  90  Resp: 16 16  Temp: 36.6 C 36.6 C  SpO2: 100% 95%    Last Pain:  Vitals:   09/02/24 0939  TempSrc: Axillary  PainSc: Asleep                 Dinesha Twiggs

## 2024-09-02 NOTE — Anesthesia Preprocedure Evaluation (Signed)
 Anesthesia Evaluation  Patient identified by MRN, date of birth, ID band Patient awake    Reviewed: Allergy & Precautions, H&P , NPO status , Patient's Chart, lab work & pertinent test results, reviewed documented beta blocker date and time   Airway Mallampati: II  TM Distance: >3 FB Neck ROM: full    Dental no notable dental hx.    Pulmonary asthma , sleep apnea    Pulmonary exam normal breath sounds clear to auscultation       Cardiovascular Exercise Tolerance: Good hypertension,  Rhythm:regular Rate:Normal     Neuro/Psych Seizures -,  PSYCHIATRIC DISORDERS  Depression       GI/Hepatic Neg liver ROS,GERD  ,,  Endo/Other  diabetes    Renal/GU Renal disease  negative genitourinary   Musculoskeletal   Abdominal   Peds  Hematology  (+) Blood dyscrasia, anemia   Anesthesia Other Findings   Reproductive/Obstetrics negative OB ROS                              Anesthesia Physical Anesthesia Plan  ASA: 3  Anesthesia Plan: MAC   Post-op Pain Management:    Induction:   PONV Risk Score and Plan: Propofol  infusion  Airway Management Planned:   Additional Equipment:   Intra-op Plan:   Post-operative Plan:   Informed Consent: I have reviewed the patients History and Physical, chart, labs and discussed the procedure including the risks, benefits and alternatives for the proposed anesthesia with the patient or authorized representative who has indicated his/her understanding and acceptance.     Dental Advisory Given  Plan Discussed with: CRNA  Anesthesia Plan Comments:         Anesthesia Quick Evaluation

## 2024-09-02 NOTE — Op Note (Signed)
 [facility] Patient Name: Xavier Turner Procedure Date: 09/02/2024 8:50 AM MRN: [id number] Date of Birth: [birth year] Attending MD: Lamar Ozell Hollingshead , MD, [id number] CSN: [id number] Age: 68 Admit Type: Outpatient Procedure:                Upper GI endoscopy Indications:              Epigastric abdominal pain, Abdominal pain in the                            right upper quadrant Providers:                Lamar Ozell Hollingshead, MD, Rosina Sprague, Bascom Blush Referring MD:              Medicines:                Propofol  per Anesthesia Complications:            No immediate complications. Estimated Blood Loss:     Estimated blood loss was minimal. Procedure:                Pre-Anesthesia Assessment:                           - Prior to the procedure, a History and Physical                            was performed, and patient medications and                            allergies were reviewed. The patient's tolerance of                            previous anesthesia was also reviewed. The risks                            and benefits of the procedure and the sedation                            options and risks were discussed with the patient.                            All questions were answered, and informed consent                            was obtained. Prior Anticoagulants: The patient has                            taken no anticoagulant or antiplatelet agents. ASA                            Grade Assessment: III - A patient with severe                            systemic disease. After reviewing the risks and  benefits, the patient was deemed in satisfactory                            condition to undergo the procedure.                           After obtaining informed consent, the endoscope was                            passed under direct vision. Throughout the                            procedure, the patient's blood pressure, pulse, and                             oxygen saturations were monitored continuously. The                            [id number] ([id number])Leezm was introduced through the                            mouth, and advanced to the second part of duodenum.                            The upper GI endoscopy was accomplished without                            difficulty. The patient tolerated the procedure                            well. Scope In: 9:28:25 AM Scope Out: 9:33:16 AM Total Procedure Duration: 0 hours 4 minutes 51 seconds  Findings:      The examined esophagus was normal.      Diffuse portal hypertensive gastropathy was found in the entire examined       stomach. Touch friability. No ulcer varices or infiltrating process.      The duodenal bulb and second portion of the duodenum were normal.       Biopsies abnormal gastric mucosa taken for histologic study. Impression:               - Normal esophagus.                           - Portal hypertensive gastropathy. Touch friability.                           - Normal duodenal bulb and second portion of the                            duodenum.                           - Status post gastric biopsy. Moderate Sedation:      Moderate (conscious) sedation was personally administered by an       anesthesia professional. The following parameters were monitored: oxygen  saturation, heart rate, blood pressure, respiratory rate, EKG, adequacy       of pulmonary ventilation, and response to care. Recommendation:           - Patient has a contact number available for                            emergencies. The signs and symptoms of potential                            delayed complications were discussed with the                            patient. Return to normal activities tomorrow.                            Written discharge instructions were provided to the                            patient.                           - Advance diet as tolerated. Continue  medications.                            Office visit 2-1/2 months. Further recommendations                            to follow pending review of pathology report. Procedure Code(s):        --- Professional ---                           [phone], Esophagogastroduodenoscopy, flexible,                            transoral; diagnostic, including collection of                            specimen(s) by brushing or washing, when performed                            (separate procedure) Diagnosis Code(s):        --- Professional ---                           K76.6, Portal hypertension                           K31.89, Other diseases of stomach and duodenum                           R10.13, Epigastric pain                           R10.11, Right upper quadrant pain CPT copyright 2022 American Medical Association. All rights reserved. The codes documented in this report are preliminary and upon coder review may  be revised to  meet current compliance requirements. Lamar HERO. Merelyn Klump, MD Lamar Ozell Hollingshead, MD 09/02/2024 9:45:55 AM This report has been signed electronically. Number of Addenda: 0

## 2024-09-02 NOTE — Transfer of Care (Signed)
 Immediate Anesthesia Transfer of Care Note  Patient: Xavier Turner  Procedure(s) Performed: EGD (ESOPHAGOGASTRODUODENOSCOPY)  Patient Location: Short Stay  Anesthesia Type:General  Level of Consciousness: drowsy  Airway & Oxygen Therapy: Patient Spontanous Breathing  Post-op Assessment: Report given to RN and Post -op Vital signs reviewed and stable  Post vital signs: Reviewed and stable  Last Vitals:  Vitals Value Taken Time  BP 117/54   Temp    Pulse 73   Resp 14   SpO2 90%     Last Pain:  Vitals:   09/02/24 0921  PainSc: 10-Worst pain ever         Complications: No notable events documented.

## 2024-09-02 NOTE — Discharge Instructions (Addendum)
 EGD Discharge instructions Please read the instructions outlined below and refer to this sheet in the next few weeks. These discharge instructions provide you with general information on caring for yourself after you leave the hospital. Your doctor may also give you specific instructions. While your treatment has been planned according to the most current medical practices available, unavoidable complications occasionally occur. If you have any problems or questions after discharge, please call your doctor. ACTIVITY You may resume your regular activity but move at a slower pace for the next 24 hours.  Take frequent rest periods for the next 24 hours.  Walking will help expel (get rid of) the air and reduce the bloated feeling in your abdomen.  No driving for 24 hours (because of the anesthesia (medicine) used during the test).  You may shower.  Do not sign any important legal documents or operate any machinery for 24 hours (because of the anesthesia used during the test).  NUTRITION Drink plenty of fluids.  You may resume your normal diet.  Begin with a light meal and progress to your normal diet.  Avoid alcoholic beverages for 24 hours or as instructed by your caregiver.  MEDICATIONS You may resume your normal medications unless your caregiver tells you otherwise.  WHAT YOU CAN EXPECT TODAY You may experience abdominal discomfort such as a feeling of fullness or gas pains.  FOLLOW-UP Your doctor will discuss the results of your test with you.  SEEK IMMEDIATE MEDICAL ATTENTION IF ANY OF THE FOLLOWING OCCUR: Excessive nausea (feeling sick to your stomach) and/or vomiting.  Severe abdominal pain and distention (swelling).  Trouble swallowing.  Temperature over 101 F (37.8 C).  Rectal bleeding or vomiting of blood.      Stomach appeared inflamed today.  Biopsies taken.  No esophageal varices seen which is good news   further recommendations to follow pending review of pathology  report  At patient request, I called Maryhelen Konig caregiver [phone] findings and recommendations   keep upcoming appointment in about 2-1/2 months.

## 2024-09-03 ENCOUNTER — Ambulatory Visit: Admitting: Neurology

## 2024-09-03 ENCOUNTER — Encounter (HOSPITAL_COMMUNITY): Payer: Self-pay | Admitting: Internal Medicine

## 2024-09-03 ENCOUNTER — Ambulatory Visit: Payer: Self-pay | Admitting: Internal Medicine

## 2024-09-03 DIAGNOSIS — G8194 Hemiplegia, unspecified affecting left nondominant side: Secondary | ICD-10-CM

## 2024-09-03 DIAGNOSIS — Q8589 Other phakomatoses, not elsewhere classified: Secondary | ICD-10-CM

## 2024-09-03 DIAGNOSIS — F79 Unspecified intellectual disabilities: Secondary | ICD-10-CM

## 2024-09-03 DIAGNOSIS — G40209 Localization-related (focal) (partial) symptomatic epilepsy and epileptic syndromes with complex partial seizures, not intractable, without status epilepticus: Secondary | ICD-10-CM | POA: Diagnosis not present

## 2024-09-03 DIAGNOSIS — G4733 Obstructive sleep apnea (adult) (pediatric): Secondary | ICD-10-CM

## 2024-09-03 LAB — SURGICAL PATHOLOGY

## 2024-09-03 NOTE — H&P (Signed)
 @LOGO @   Gastroenterology Progress Note    Primary Care Physician:  Wilhelmina Cedar Primary Gastroenterologist:  Dr. Shaaron  Pre-Procedure History & Physical: HPI:  Xavier Turner is a 68 y.o. male here for further evaluation of epigastric and right upper quadrant abdominal pain.  Denies dysphagia.  Past Medical History:  Diagnosis Date   Anemia    Asthma    Chronic kidney disease    Diabetes (HCC)    Glaucoma    Hemiparesis (HCC)    Hypercholesterolemia    Hyperlipemia    Melanoma (HCC)    Melanoma (HCC)    Obesity    OSA (obstructive sleep apnea)    Seizure (HCC)    Seizure disorder (HCC)    Sturge syndrome (HCC)    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HCC)     Past Surgical History:  Procedure Laterality Date   AMPUTATION Left 07/24/2019   Procedure: AMPUTATION RAY, fifth left;  Surgeon: Harden Jerona GAILS, MD;  Location: [facility] OR;  Service: Orthopedics;  Laterality: Left;   COLONOSCOPY     COLONOSCOPY WITH PROPOFOL  N/A 08/26/2023   Procedure: COLONOSCOPY WITH PROPOFOL ;  Surgeon: Cindie Carlin POUR, DO;  Location: [facility];  Service: Endoscopy;  Laterality: N/A;  12:30 pm, asa 3   CRANIOTOMY  1999   ESOPHAGOGASTRODUODENOSCOPY N/A 09/02/2024   Procedure: EGD (ESOPHAGOGASTRODUODENOSCOPY);  Surgeon: Shaaron Lamar HERO, MD;  Location: [facility];  Service: Endoscopy;  Laterality: N/A;  9:15 am, asa 3   FLEXIBLE SIGMOIDOSCOPY  04/08/2023   Procedure: FLEXIBLE SIGMOIDOSCOPY;  Surgeon: Cindie Carlin POUR, DO;  Location: [facility];  Service: Endoscopy;;   melanoma removal  2011   POLYPECTOMY  08/26/2023   Procedure: POLYPECTOMY;  Surgeon: Cindie Carlin POUR, DO;  Location: [facility];  Service: Endoscopy;;    Prior to Admission medications  Medication Sig Start Date End Date Taking? Authorizing Provider  ALPRAZolam  (XANAX ) 0.5 MG tablet Take 1 tablet (0.5 mg total) by mouth at bedtime as needed for sleep (for use in the sleep lab, bring to the test at the sleep lab, take in lab.). 08/07/24   Yes Dohmeier, Dedra, MD  aspirin  81 MG chewable tablet Chew 81 mg by mouth daily.   Yes [provider]  bisacodyl  (DULCOLAX) 5 MG EC tablet Take 5 mg by mouth daily as needed for moderate constipation.   Yes [provider]  brimonidine  (ALPHAGAN ) 0.2 % ophthalmic solution Place 1 drop into both eyes 3 (three) times daily. 08/28/19  Yes Marsa Arlean BIRCH, MD  carboxymethylcellulose (REFRESH PLUS) 0.5 % SOLN Place 1 drop into both eyes every 6 (six) hours as needed (dry eyes).   Yes [provider]  clotrimazole-betamethasone (LOTRISONE) cream Apply 1 Application topically at bedtime.   Yes [provider]  dorzolamide -timolol  (COSOPT ) 22.3-6.8 MG/ML ophthalmic solution Place 1 drop into the right eye 2 (two) times daily. 08/28/19  Yes Marsa Arlean BIRCH, MD  Emollient (EUCERIN) lotion Apply 1 Application topically every 12 (twelve) hours as needed for dry skin.   Yes [provider]  famotidine  (PEPCID ) 20 MG tablet Take 1 tablet (20 mg total) by mouth at bedtime. 06/11/24  Yes Mahon, Charmaine CROME, NP  fenofibrate (TRICOR) 48 MG tablet Take 54 mg by mouth daily.   Yes [provider]  furosemide  (LASIX ) 20 MG tablet Take by mouth. 11/18/23  Yes [provider]  gabapentin  (NEURONTIN ) 100 MG capsule Take 1 capsule (100 mg total) by mouth 2 (two) times daily. 08/28/19  Yes Marsa Arlean BIRCH, MD  hydrocortisone  (ANUSOL -HC) 2.5 % rectal cream Place 1 Application rectally 2 (two) times daily. Use for 7 days then use as needed 08/06/24  Yes Mahon, Charmaine CROME, NP  hydrocortisone  (ANUSOL -HC) 25 MG suppository Place 25 mg rectally every 8 (eight) hours as needed for hemorrhoids.   Yes [provider]  Menthol, Topical Analgesic, 5 % GEL Apply 1 application  topically 2 (two) times daily. Apply to bilateral knees topically twice a day   Yes [provider]  metFORMIN  (GLUCOPHAGE ) 1000 MG tablet Take 1 tablet (1,000 mg total) by mouth 2  (two) times daily with a meal. 08/28/19  Yes Marsa Arlean BIRCH, MD  midodrine  (PROAMATINE ) 5 MG tablet Take 1 tablet (5 mg total) by mouth 3 (three) times daily with meals. 07/16/23  Yes Gherghe, Costin M, MD  mirtazapine (REMERON) 15 MG tablet Take 15 mg by mouth at bedtime.   Yes [provider]  polyethylene glycol (MIRALAX  / GLYCOLAX ) 17 g packet Take 17 g by mouth daily. Mix in 8 ounces of water . 06/06/23  Yes Mahon, Courtney L, NP  ROCKLATAN  0.02-0.005 % SOLN Place 1 drop into both eyes at bedtime. 08/28/19  Yes Marsa Arlean BIRCH, MD  rosuvastatin  (CRESTOR ) 40 MG tablet Take 40 mg by mouth at bedtime. 03/14/23  Yes [provider]  sitaGLIPtin  (JANUVIA ) 100 MG tablet Take 1 tablet (100 mg total) by mouth daily. 08/28/19  Yes Marsa Arlean BIRCH, MD  tamsulosin  (FLOMAX ) 0.4 MG CAPS capsule Take 1 capsule (0.4 mg total) by mouth daily. 08/28/19  Yes Marsa Arlean BIRCH, MD  traZODone (DESYREL) 50 MG tablet Take 50 mg by mouth at bedtime.   Yes [provider]  venlafaxine XR (EFFEXOR-XR) 75 MG 24 hr capsule Take 75 mg by mouth daily with breakfast.   Yes [provider]  acetaminophen  (TYLENOL ) 325 MG tablet Take 650 mg by mouth every 6 (six) hours as needed for mild pain (pain score 1-3).    [provider]    Allergies as of 08/07/2024 - Review Complete 08/06/2024  Allergen Reaction Noted   Penicillin g Anaphylaxis 03/06/2023    Family History  Problem Relation Age of Onset   Emphysema Father    Cancer Father    Asthma Mother     Social History   Socioeconomic History   Marital status: Single    Spouse name: Not on file   Number of children: Not on file   Years of education: Not on file   Highest education level: Not on file  Occupational History   Occupation: [employer]    Comment: resides in a group home([facility])  Tobacco Use   Smoking status: Never   Smokeless tobacco: Never  Vaping Use   Vaping status: Never Used  Substance and  Sexual Activity   Alcohol  use: Not Currently   Drug use: Not Currently   Sexual activity: Not Currently  Other Topics Concern   Not on file  Social History Narrative   Single.  Employed by [employer].  Never smoker. Has legal guardian.    Social Drivers of Health   Tobacco Use: Low Risk (09/02/2024)   Patient History    Smoking Tobacco Use: Never    Smokeless Tobacco Use: Never    Passive Exposure: Not on file  Financial Resource Strain: Low Risk (10/25/2021)   Received [facility]   Overall Financial Resource Strain (CARDIA)    Difficulty of Paying Living Expenses: Not hard  at all  Food Insecurity: No Food Insecurity (10/25/2021)   Received from [facility]   Epic    Within the past 12 months, you worried that your food would run out before you got the money to buy more.: Never true    Within the past 12 months, the food you bought just didn't last and you didn't have money to get more.: Never true  Transportation Needs: No Transportation Needs (10/25/2021)   Received from [facility]   PRAPARE - Transportation    Lack of Transportation (Medical): No    Lack of Transportation (Non-Medical): No  Physical Activity: Unknown (10/25/2021)   Received from [facility]   Exercise Vital Sign    On average, how many days per week do you engage in moderate to strenuous exercise (like a brisk walk)?: 0 days    Minutes of Exercise per Session: Not on file  Recent Concern: Physical Activity - Inactive (10/25/2021)   Received from [facility]   Exercise Vital Sign    Days of Exercise per Week: 0 days    Minutes of Exercise per Session: 0 min  Stress: Stress Concern Present (10/25/2021)   Received from [facility] - Occupational Stress Questionnaire    Feeling of Stress : To some extent  Social Connections: Socially Isolated (10/25/2021)   Received from [facility]   Social Connection and Isolation Panel    In a typical week, how  many times do you talk on the phone with family, friends, or neighbors?: Never    How often do you get together with friends or relatives?: Never    How often do you attend church or religious services?: Never    Do you belong to any clubs or organizations such as church groups, unions, fraternal or athletic groups, or school groups?: Yes    How often do you attend meetings of the clubs or organizations you belong to?: Never    Are you married, widowed, divorced, separated, never married, or living with a partner?: Never married  Intimate Partner Violence: Not At Risk (10/25/2021)   Received from [facility]   Epic    Within the last year, have you been afraid of your partner or ex-partner?: No    Within the last year, have you been humiliated or emotionally abused in other ways by your partner or ex-partner?: No    Within the last year, have you been kicked, hit, slapped, or otherwise physically hurt by your partner or ex-partner?: No    Within the last year, have you been raped or forced to have any kind of sexual activity by your partner or ex-partner?: No  Depression (PHQ2-9): Not on file  Alcohol  Screen: Not on file  Housing: Not on file  Utilities: Not on file  Health Literacy: Not on file    Review of Systems   See HPI, otherwise negative ROS  Physical Exam: BP (!) 117/54   Pulse 90   Temp 97.8 F (36.6 C) (Axillary)   Resp 16   SpO2 95%  General:   Alert,  Well-developed, well-nourished, pleasant and cooperative in NAD Lungs:  Clear throughout to auscultation.   No wheezes, crackles, or rhonchi. No acute distress. Heart:  Regular rate and rhythm; no murmurs, clicks, rubs,  or gallops. Abdomen: Non-distended, normal bowel sounds.  Soft and nontender without appreciable mass or hepatosplenomegaly.   Impression/Plan:   68 year old gentleman with epigastric and right upper quadrant abdominal pain.  He is here for EGD to further evaluate.The risks, benefits, limitations,  alternatives and imponderables have been reviewed with the patient. Potential for esophageal dilation, biopsy, etc. have also been reviewed.  Questions have been answered. All parties agreeable.     Notice: This dictation was prepared with Dragon dictation along with smaller phrase technology. Any transcriptional errors that result from this process are unintentional and may not be corrected upon review.

## 2024-09-09 ENCOUNTER — Ambulatory Visit: Payer: Self-pay | Admitting: Neurology

## 2024-09-09 DIAGNOSIS — G40209 Localization-related (focal) (partial) symptomatic epilepsy and epileptic syndromes with complex partial seizures, not intractable, without status epilepticus: Secondary | ICD-10-CM | POA: Insufficient documentation

## 2024-09-09 NOTE — Procedures (Signed)
°  [facility]  EEG (ELECTROENCEPHALOGRAM) REPORT    ORDERING CLINICIAN: Dedra Gores, M.D.  TECHNOLOGIST: Burnard Plummer REEGT TECHNIQUE:  This EEG study was done with scalp electrodes positioned according to the 10-20 International system of electrode placement. Electrical activity was reviewed with band pass filter of 1-70Hz , sensitivity of 7 uV/mm, display speed of 21mm/sec with a 60Hz  notched filter applied as appropriate. EEG data were recorded continuously and digitally stored.    Recoding duration :2 hour recording for this long term [facility] patient with history of Sturge-Weber Syndrome, Epilepsy surgery, and developmental disabilities.  Activation included:  Photic stimulation , but no Hyperventilation  .      Description: The EEG's posterior dominant background rhythm of 7 herz was seen over O1, but  there was no corresponding right occipital background rhythm.  At baseline, the recording showed a left hemispheric high amplitude EEG , right  hemispheric  void - asymmetric fashion. There were central-parietal left hemispheric periodic / rhythmic discharges,and epileptiform activity.  Photic stimulation was initiated at frequencies from 3- through 21 hertz, resulting in more frequent rhythmic discharges, and epileptiform activity for the duration of the application of  the photic stimulus.   Hyperventilation maneuver was  deferred.    The patient was recorded while awake, while drowsy ,and  while asleep.   ECG: heart rate was 60 bpm and regular. .   IMPRESSION:  This highly abnormal EEG showed a left hemispheric epileptiform activity at baseline,  but the  initially 2-3 second duration of activity became minutes after photic stimulation was applied.  Thr right brain did not show irritability nor responsiveness to stimuli.   Ongoing epileptic activity.    Dr. Dedra Gores, M.D. Accredited by the ABPN, ABSM.

## 2024-09-10 NOTE — Telephone Encounter (Signed)
 Lvm 1st attempt by hf 09/10/24

## 2024-09-10 NOTE — Telephone Encounter (Signed)
-----   Message from Dedra Gores, MD sent at 09/09/2024  5:25 PM EST ----- Highly abnormal EEG of 120 minute duration, with left brain epileptiform  sharp waves and spikes at baseline,    becoming  prolonged seizures-  activity with facial and arm/ shoulder movements, unresponsive to verbal stimuli during photic stimulation.  Right brain activity showed much significantly slower activity, of lower amplitude and unresponsive to photics.

## 2024-09-14 NOTE — Progress Notes (Signed)
 Noted will send to [facility] and retrieve the labs also

## 2024-09-23 ENCOUNTER — Telehealth: Payer: Self-pay | Admitting: Gastroenterology

## 2024-09-23 NOTE — Telephone Encounter (Signed)
 I received the labs from [facility] and reviewed them.  His B12 and folate levels are normal.  His iron panel is normal.  His CMP was unremarkable other than elevated glucose.  His AST and ALT were within normal limits as well as his bilirubin and alkaline phosphatase.  Did not do anything additional specific liver labs that I ordered at his last office visit.  Xavier Turner/Mandy -please mail them a copy of the requisition from all the prior labs that I ordered on 07/22/2024 which include viral hepatitis labs, autoimmune serologies, immunoglobulins, CRP, ESR, fibrosis labs, and alkaline phosphatase isoenzymes as well as a ferritin level.  I placed a copy of this on your desk [place].

## 2024-09-28 NOTE — Telephone Encounter (Signed)
 I faxes lab requisitions to facility

## 2024-10-28 ENCOUNTER — Telehealth: Payer: Self-pay | Admitting: Neurology

## 2024-10-28 NOTE — Telephone Encounter (Signed)
 I called Belinda with [facility] to schedule his CPAP SS. I left a voicemail on her phone to call me back to schedule and I left my direct number.

## 2024-11-11 ENCOUNTER — Ambulatory Visit: Admitting: Gastroenterology

## 2024-12-15 ENCOUNTER — Ambulatory Visit: Admitting: Podiatry
